# Patient Record
Sex: Female | Born: 1955 | Race: Black or African American | Hispanic: No | Smoking: Current every day smoker
Health system: Southern US, Community
[De-identification: ages and names within clinical notes are randomized; demographics above are authoritative.]

## PROBLEM LIST (undated history)

## (undated) DIAGNOSIS — I10 Essential (primary) hypertension: Secondary | ICD-10-CM

## (undated) DIAGNOSIS — B351 Tinea unguium: Secondary | ICD-10-CM

## (undated) DIAGNOSIS — J45909 Unspecified asthma, uncomplicated: Secondary | ICD-10-CM

## (undated) DIAGNOSIS — R1312 Dysphagia, oropharyngeal phase: Secondary | ICD-10-CM

## (undated) DIAGNOSIS — I4891 Unspecified atrial fibrillation: Secondary | ICD-10-CM

## (undated) DIAGNOSIS — C300 Malignant neoplasm of nasal cavity: Secondary | ICD-10-CM

## (undated) HISTORY — DX: Dysphagia, oropharyngeal phase: R13.12

## (undated) HISTORY — DX: Malignant neoplasm of nasal cavity: C30.0

## (undated) HISTORY — DX: Unspecified atrial fibrillation: I48.91

## (undated) HISTORY — DX: Tinea unguium: B35.1

---

## 1998-12-31 ENCOUNTER — Emergency Department (HOSPITAL_COMMUNITY): Admission: EM | Admit: 1998-12-31 | Discharge: 1998-12-31 | Payer: Self-pay

## 1999-01-01 ENCOUNTER — Encounter: Payer: Self-pay | Admitting: Emergency Medicine

## 2003-12-28 ENCOUNTER — Inpatient Hospital Stay (HOSPITAL_COMMUNITY): Admission: EM | Admit: 2003-12-28 | Discharge: 2004-01-10 | Payer: Self-pay | Admitting: Emergency Medicine

## 2003-12-28 IMAGING — CT CT ABDOMEN W/O CM
1 of 2 series · 14 of 32 positions shown, 18 images · IV contrast (agent unspecified)
Comparison: none

CLINICAL DATA: Dyspnea.  Sepsis.  Arrest.  Leukocytosis.  Abdominal pain.  
 PORTABLE CHEST ONE VIEW
 Portable exam 6476 hours.  Endotracheal tube at T3 in satisfactory position above carina.  Cardiac monitoring lines project over chest.  Heart size normal.  Lungs clear.  Question nodule versus nipple shadow lower right chest. 
 Minimal right base atelectasis.  
 IMPRESSION
 Satisfactory endotracheal tube position.  Question right nipple shadow versus nodule.  Mild right base atelectasis.  
 CT CHEST, ABDOMEN AND PELVIS WITH CONTRAST 
 Multidetector helical CT imaging of the chest, abdomen and pelvis performed without IV or oral contrast due to patient?s condition.

[Series 2: chest/abd/pelvis · axial · 0.63mm/px · z∈[-480,+94]mm · 14 of 137 slices shown, 18 images]
[im 6/137  soft-tissue]
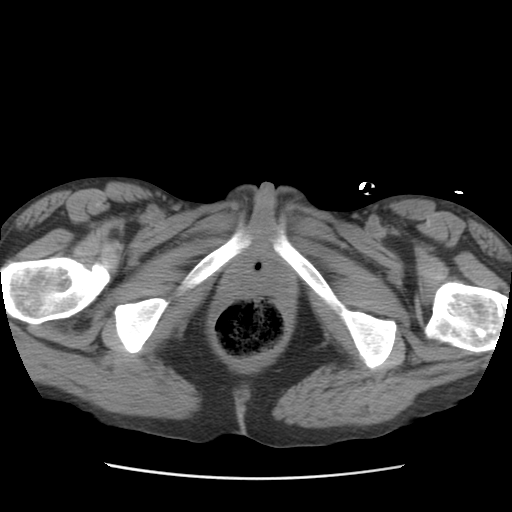
[im 6/137  bone]
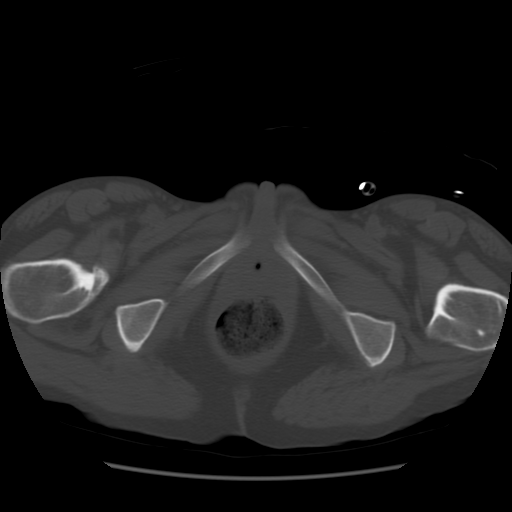
[im 18/137  soft-tissue]
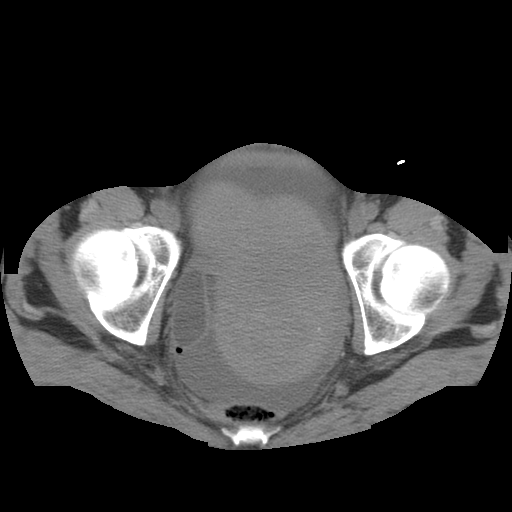
[im 30/137  soft-tissue]
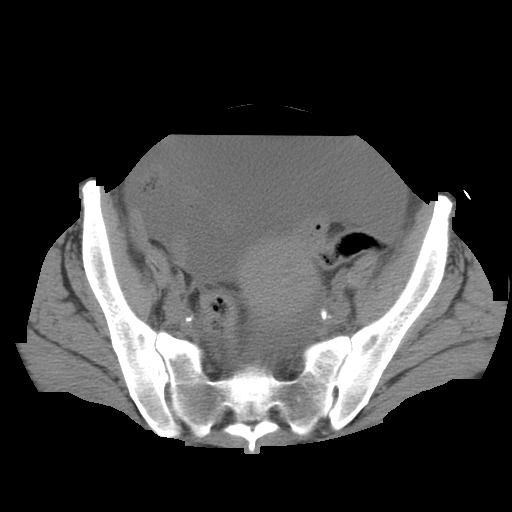
[im 42/137  soft-tissue]
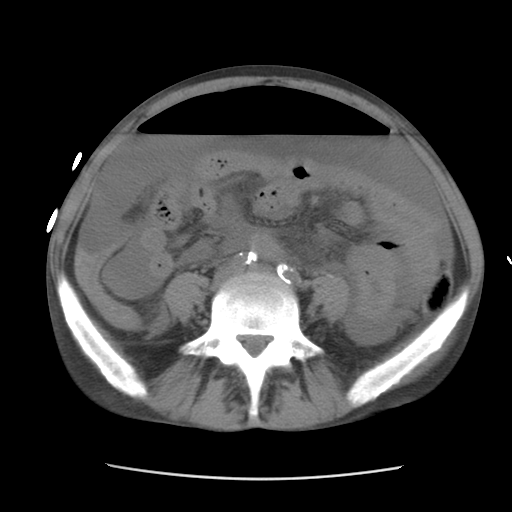
[im 54/137  soft-tissue]
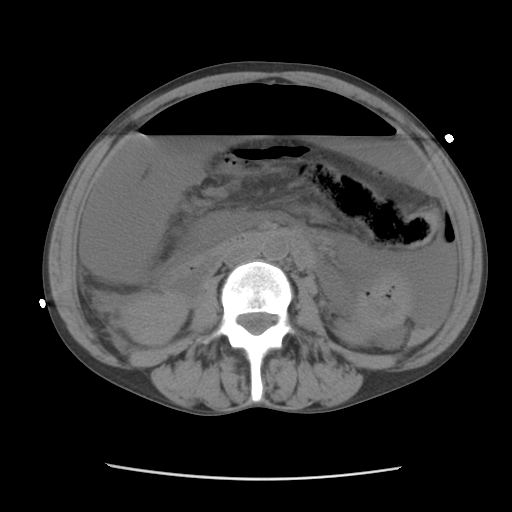
[im 66/137  soft-tissue]
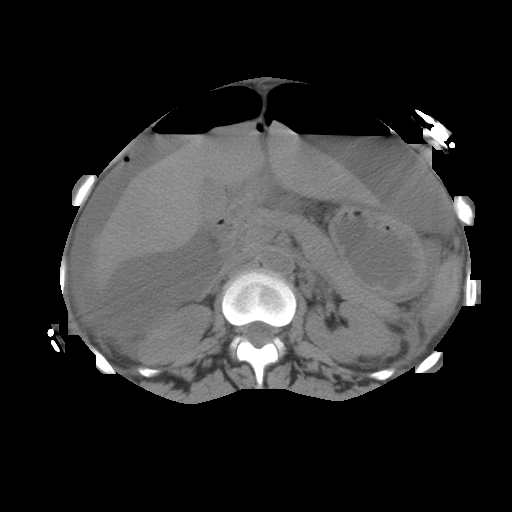
[im 71/137  soft-tissue]
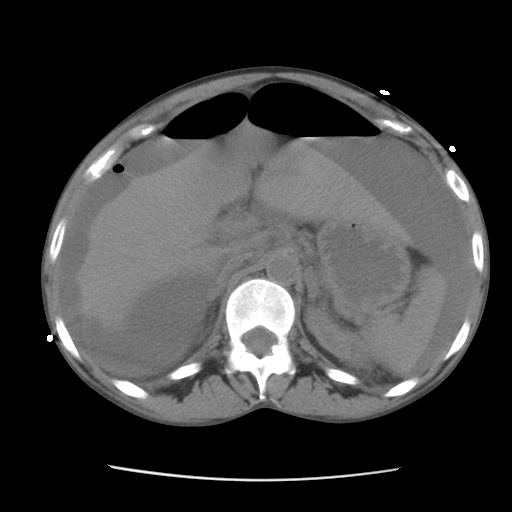
[im 83/137  soft-tissue]
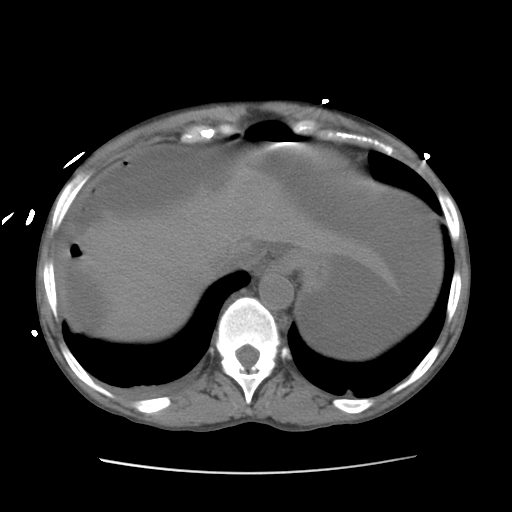
[im 95/137  soft-tissue]
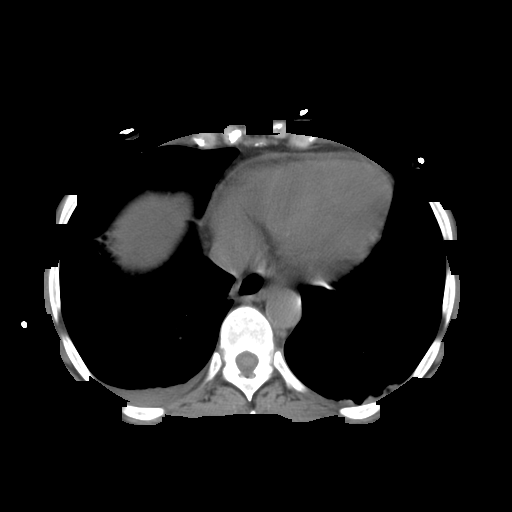
[im 95/137  bone]
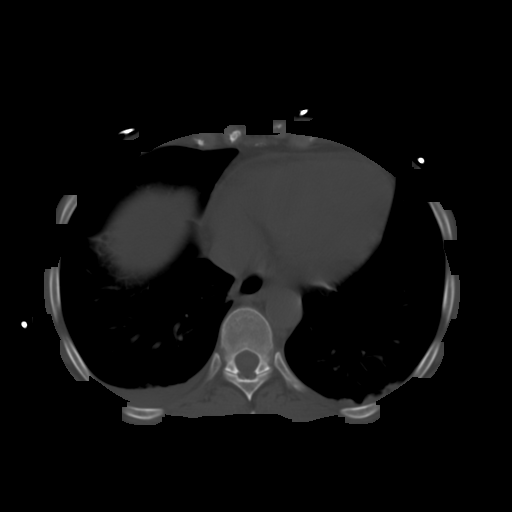
[im 107/137  soft-tissue]
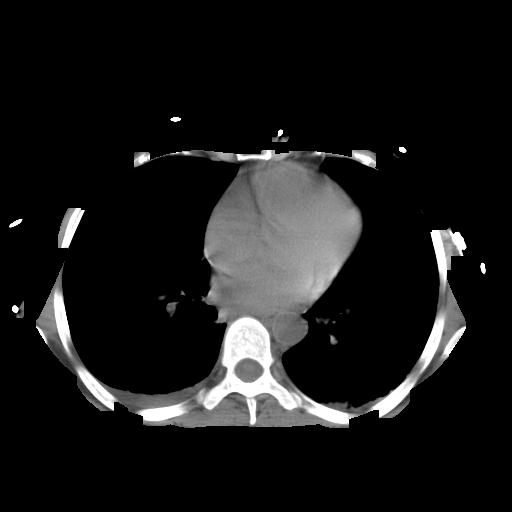
[im 113/137  lung]
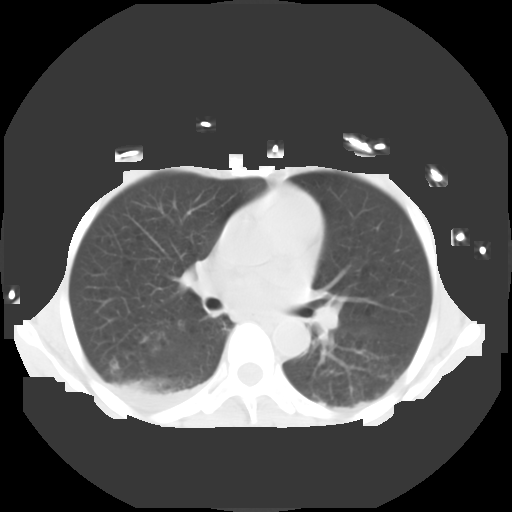
[im 119/137  soft-tissue]
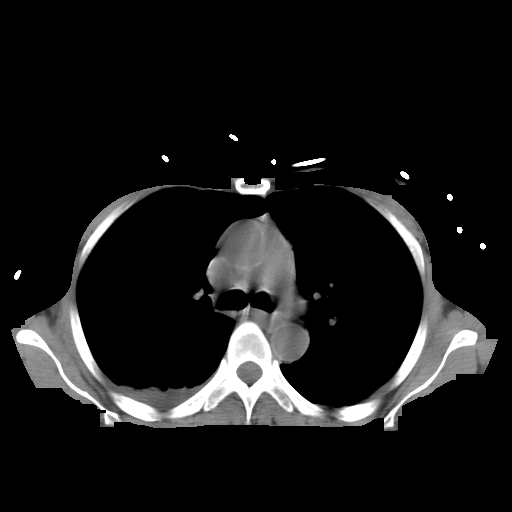
[im 119/137  lung]
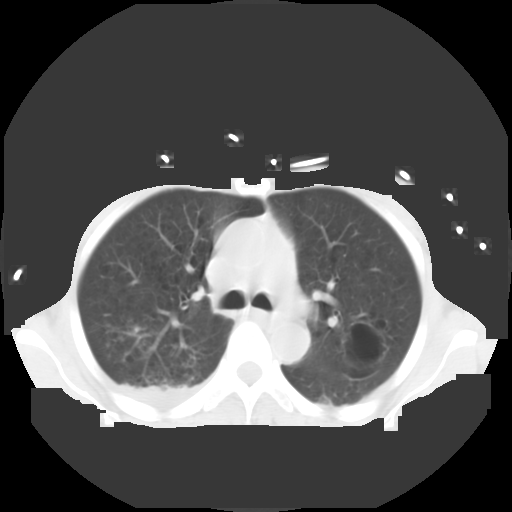
[im 125/137  lung]
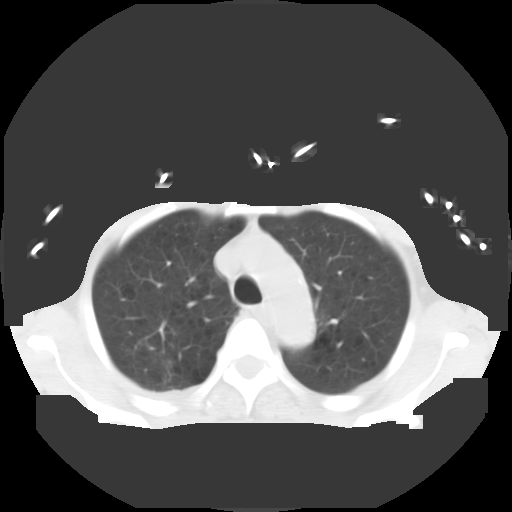
[im 131/137  soft-tissue]
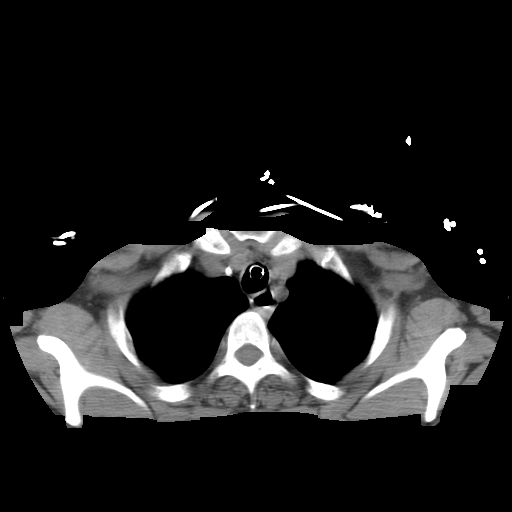
[im 131/137  lung]
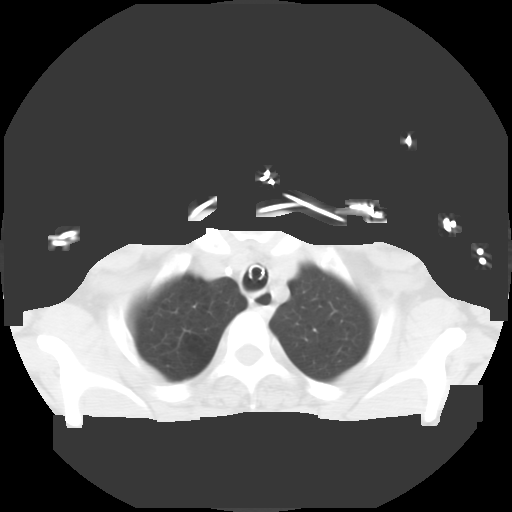

[14 of 32 positions shown; findings below may reference images not displayed]

FINDINGS: CT CHEST
 COPD.  Very small bilateral effusions right greater than left with minimal dependent atelectasis.  No evidence of pulmonary mass or nodule.  Mild infiltrate in posterior right lung.  Endotracheal tube in trachea above carina.  No pneumothorax. 
 IMPRESSION
 Minimal effusions right greater than left.  Mild posterior right lung infiltrate.  
 CT ABDOMEN
 Marked ascites.  Marked free intraperitoneal air compatible with perforated viscus.  Extensive stranding of mesenteric intraabdominal tissue planes.  Suspect small bowel wall thickening.  Extensive atherosclerotic calcifications.  Cannot exclude ischemia of the small bowel with this appearance.  Slight scalloping of the hepatic margins right lobe without focal hepatic mass.  Cholelithiasis.  Pancreas, spleen, kidneys and adrenal glands are grossly unremarkable.  
 IMPRESSION
 Cholelithiasis.  Extensive ascites with free intraperitoneal air compatible with perforated viscus.  Small bowel wall thickening, cannot exclude ischemia.  
 CT PELVIS
 Free air and ascites again seen.  Large pelvic mass, lobulated contours, 14.9 X 9.9 cm in size, extending for 8.5 cm in length.  This could represent an enlarged fibroid uterus but uterine tumors must also be considered.  Additionally, cannot exclude ovarian tumor.  Several septations are seen within the fluid collection in the right pelvis, question etiology.  Foley catheter in bladder.  Thickening of small bowel loops noted in pelvis. 
 IMPRESSION
 Large pelvic soft tissue mass question enlarged fibroid uterus versus tumor.  Correlation with patient?s history recommended. 
 Dr. Adriati and emergency room notified of findings.

## 2003-12-28 IMAGING — CR DG CHEST 1V PORT
1 series · 1 of 1 positions shown · IV contrast (agent unspecified)
Comparison: none

CLINICAL DATA: Dyspnea.  Sepsis.  Arrest.  Leukocytosis.  Abdominal pain.  
 PORTABLE CHEST ONE VIEW
 Portable exam 6476 hours.  Endotracheal tube at T3 in satisfactory position above carina.  Cardiac monitoring lines project over chest.  Heart size normal.  Lungs clear.  Question nodule versus nipple shadow lower right chest. 
 Minimal right base atelectasis.  
 IMPRESSION
 Satisfactory endotracheal tube position.  Question right nipple shadow versus nodule.  Mild right base atelectasis.  
 CT CHEST, ABDOMEN AND PELVIS WITH CONTRAST 
 Multidetector helical CT imaging of the chest, abdomen and pelvis performed without IV or oral contrast due to patient?s condition.

[view not recorded]
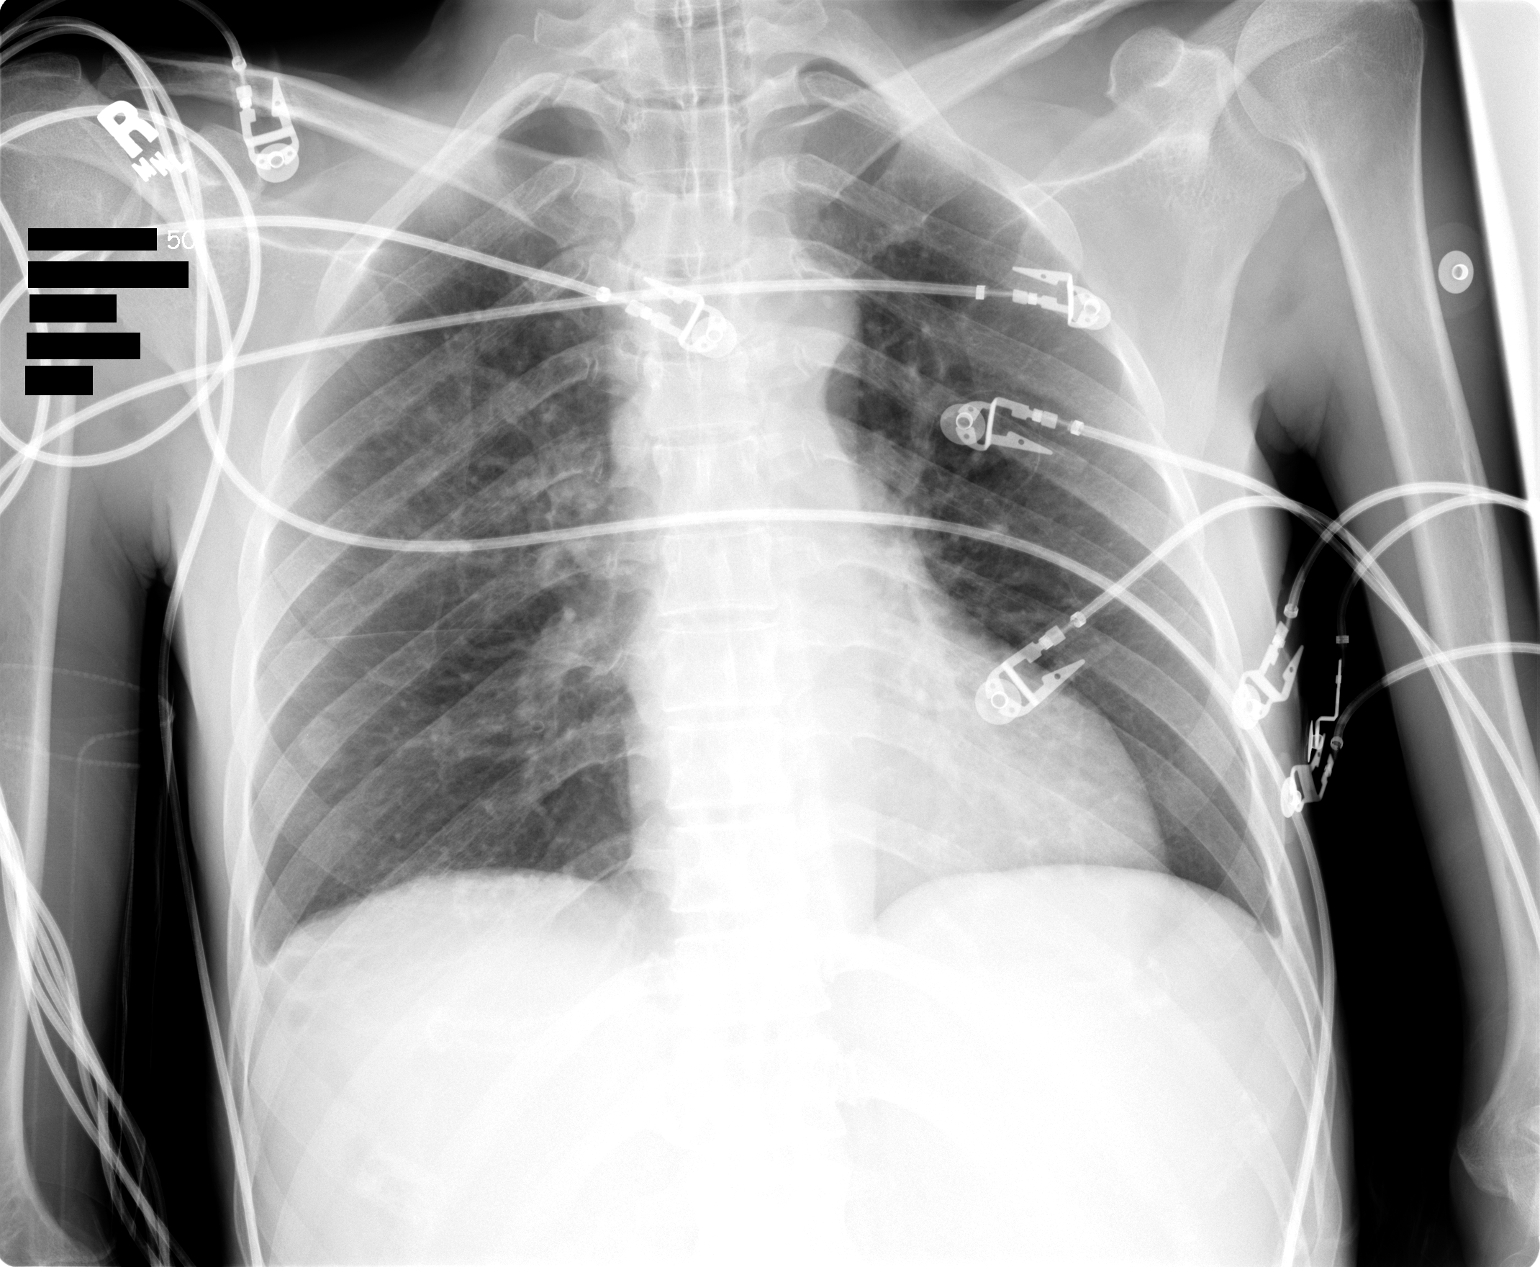

[1 of 1 positions shown; findings below may reference images not displayed]

FINDINGS: CT CHEST
 COPD.  Very small bilateral effusions right greater than left with minimal dependent atelectasis.  No evidence of pulmonary mass or nodule.  Mild infiltrate in posterior right lung.  Endotracheal tube in trachea above carina.  No pneumothorax. 
 IMPRESSION
 Minimal effusions right greater than left.  Mild posterior right lung infiltrate.  
 CT ABDOMEN
 Marked ascites.  Marked free intraperitoneal air compatible with perforated viscus.  Extensive stranding of mesenteric intraabdominal tissue planes.  Suspect small bowel wall thickening.  Extensive atherosclerotic calcifications.  Cannot exclude ischemia of the small bowel with this appearance.  Slight scalloping of the hepatic margins right lobe without focal hepatic mass.  Cholelithiasis.  Pancreas, spleen, kidneys and adrenal glands are grossly unremarkable.  
 IMPRESSION
 Cholelithiasis.  Extensive ascites with free intraperitoneal air compatible with perforated viscus.  Small bowel wall thickening, cannot exclude ischemia.  
 CT PELVIS
 Free air and ascites again seen.  Large pelvic mass, lobulated contours, 14.9 X 9.9 cm in size, extending for 8.5 cm in length.  This could represent an enlarged fibroid uterus but uterine tumors must also be considered.  Additionally, cannot exclude ovarian tumor.  Several septations are seen within the fluid collection in the right pelvis, question etiology.  Foley catheter in bladder.  Thickening of small bowel loops noted in pelvis. 
 IMPRESSION
 Large pelvic soft tissue mass question enlarged fibroid uterus versus tumor.  Correlation with patient?s history recommended. 
 Dr. Adriati and emergency room notified of findings.

## 2003-12-28 IMAGING — CR DG CHEST 1V PORT
1 series · 1 of 1 positions shown · non-contrast
Comparison: none

CLINICAL DATA: Shock.  Metabolic acidosis. Acute abdomen. 
 PORTABLE CHEST 12/28/03 AT 9961 HOURS

[view not recorded]
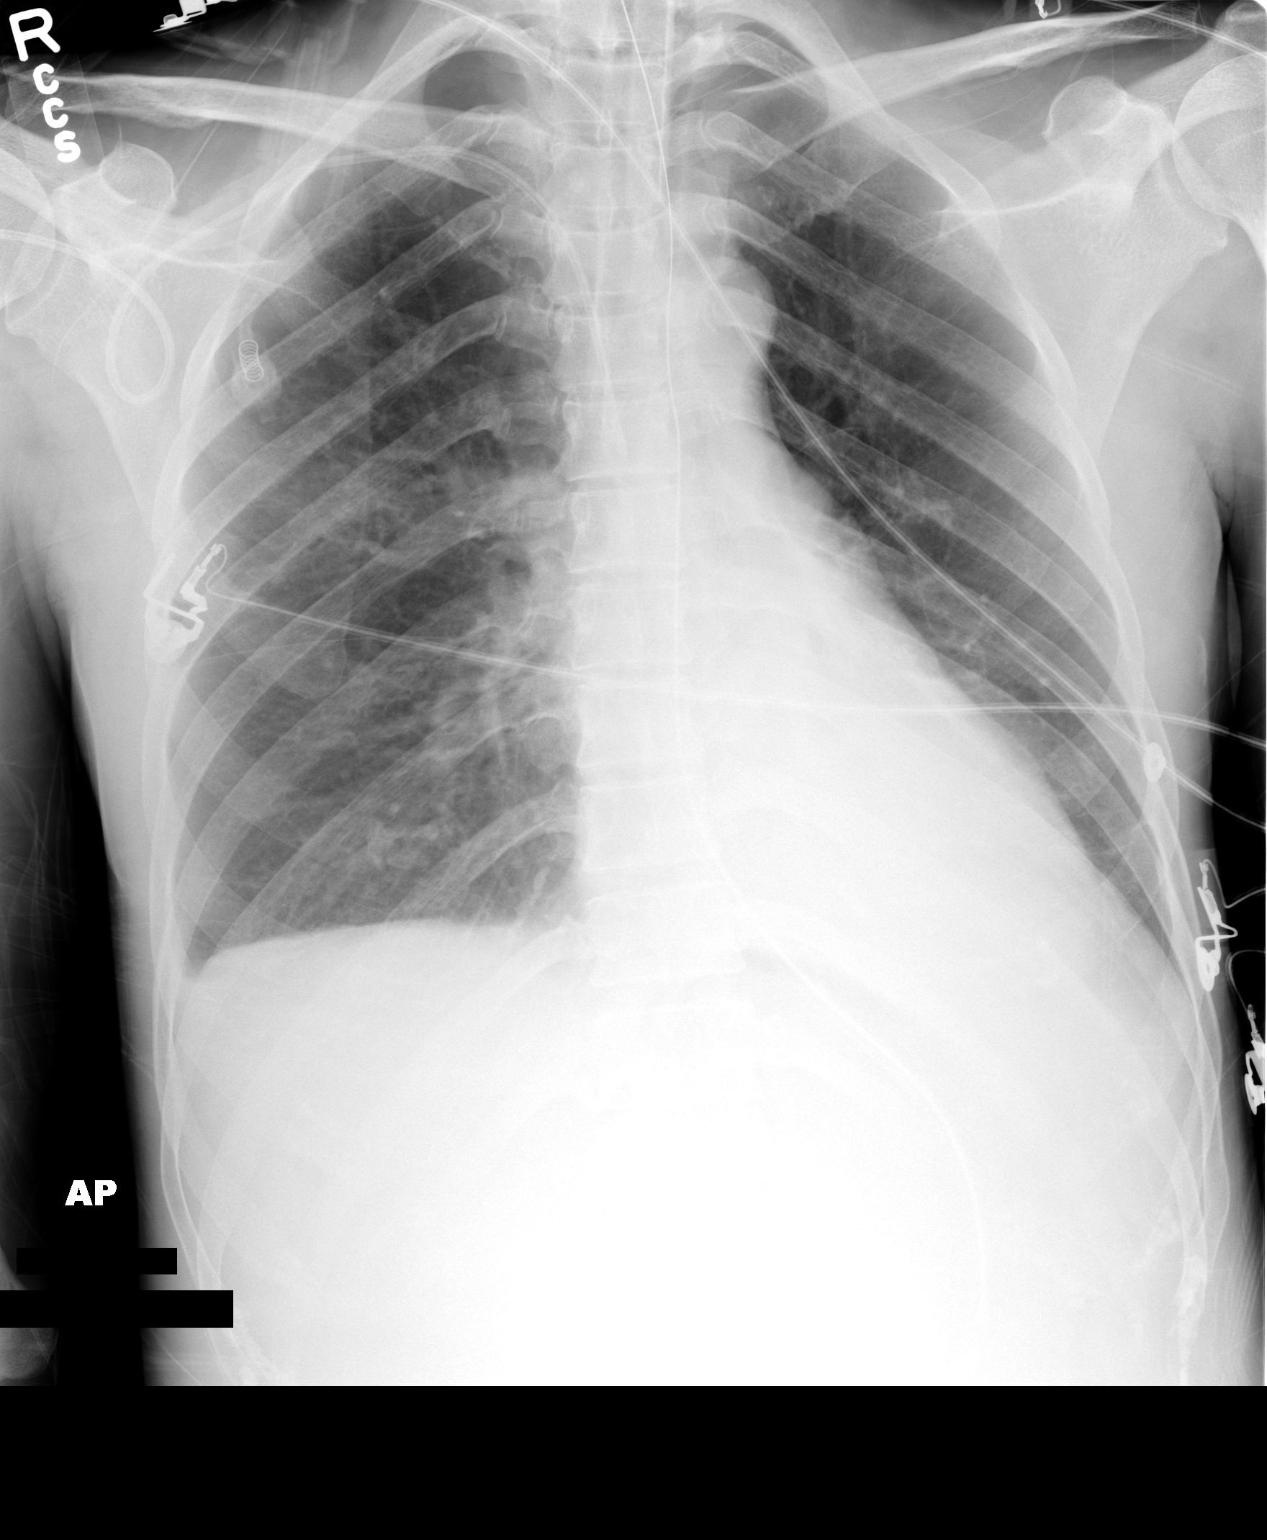

[1 of 1 positions shown; findings below may reference images not displayed]

FINDINGS: Nasogastric tube, endotracheal tube, and PICC line project in expected location.  There has been development of left retrocardiac consolidation or atelectasis since previous film of the same day.  There is blunting of the right costophrenic angle which  may be due to some scarring or small effusion.  
 IMPRESSION
 Nasogastric tube placement to the stomach.  
 New left retrocardiac consolidation or atelectasis.
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
## 2003-12-29 IMAGING — CR DG CHEST 1V PORT
1 series · 1 of 1 positions shown · non-contrast
Comparison: none

CLINICAL DATA: Shock.  Metabolic acidosis.  Follow-up.  
 CHEST PORTABLE ONE VIEW 
 Portable exam 0505 hours.  Compared to 12/28/03.  Endotracheal tube above the aortic arch in satisfactory position above carina.  Nasogastric tube in stomach.  Right subclavian central venous catheter tip SVC.  Upper normal heart size.  Mediastinal contours and vascularity normal.  Bibasilar atelectasis persists.  Left lower lobe consolidation.  No pneumothorax. 
 IMPRESSION
 Left  lower lobe atelectasis versus consolidation.  Mild right base atelectasis.  No interval change.

[view not recorded]
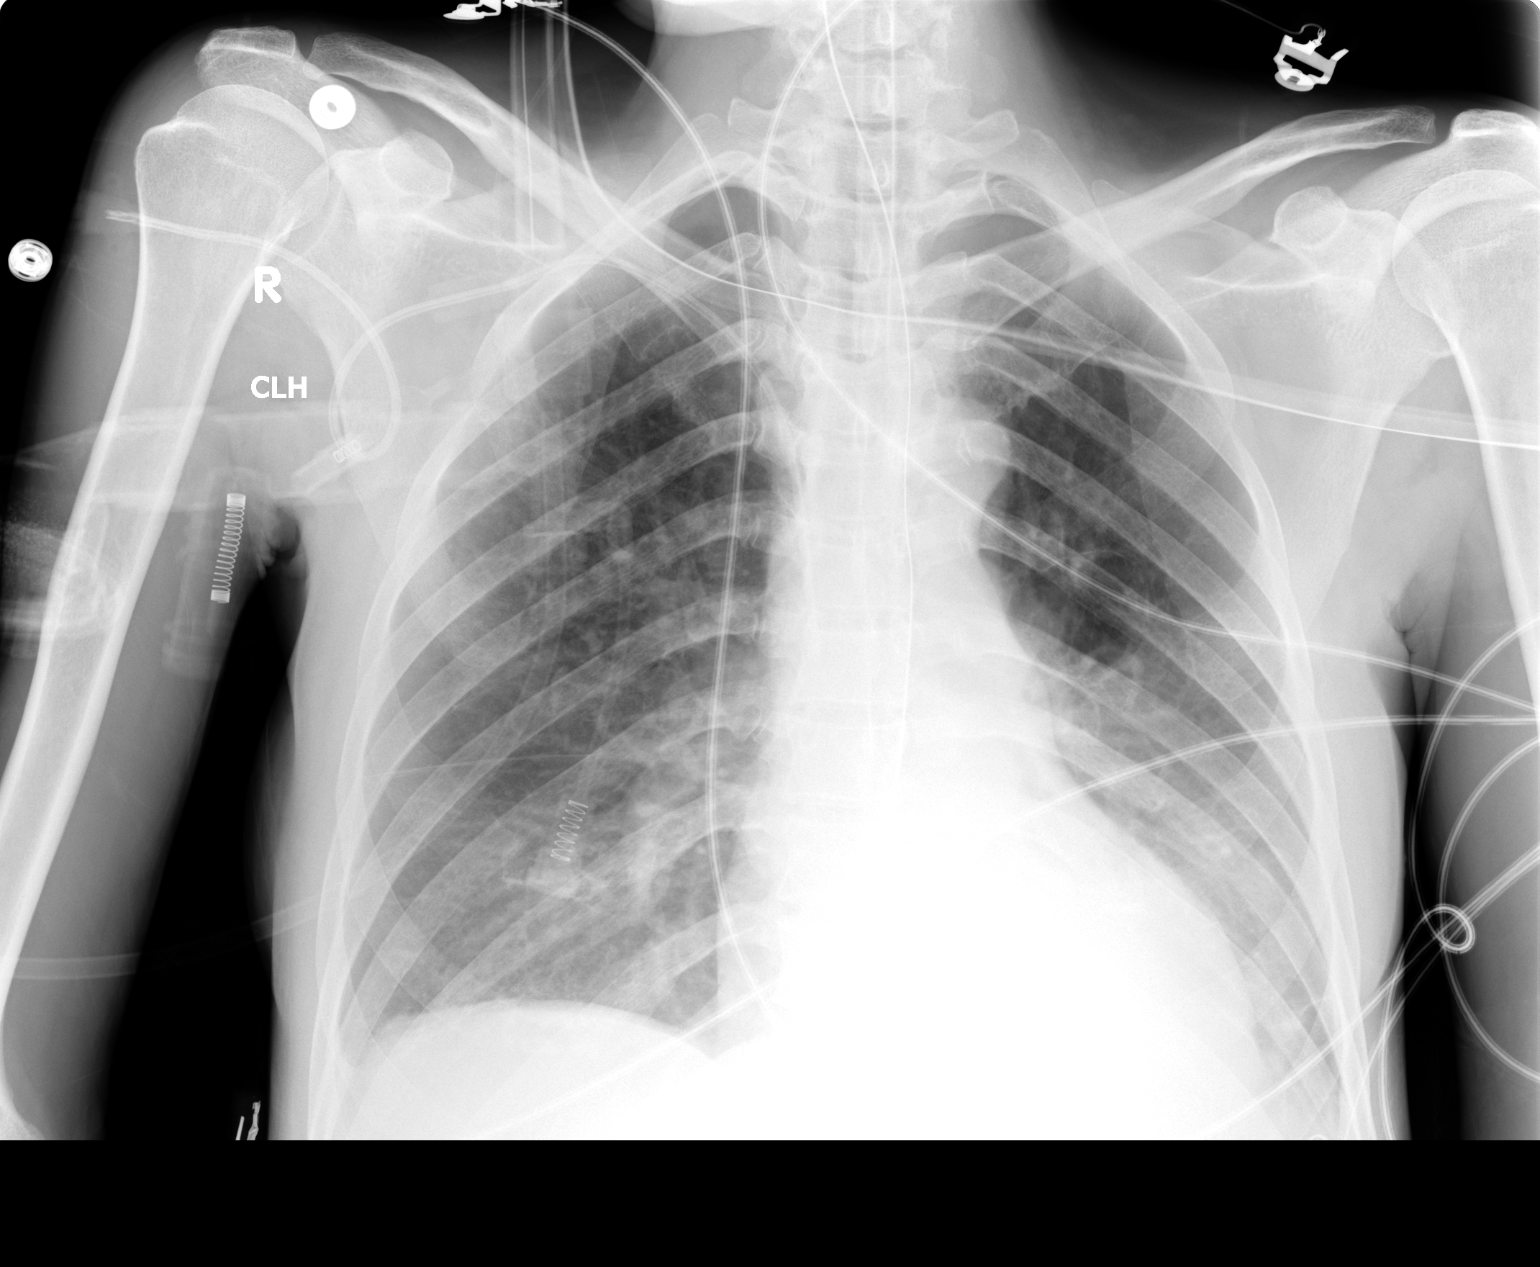

[1 of 1 positions shown; findings below may reference images not displayed]
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
## 2004-01-04 IMAGING — CR DG CHEST 1V PORT
1 series · 1 of 1 positions shown · non-contrast
Comparison: 01/03/04

PORTABLE SINGLE-VIEW CHEST:  01/04/04 [DATE]

 CLINICAL DATA
 Metabolic acidosis and respiratory arrest.

[view not recorded]
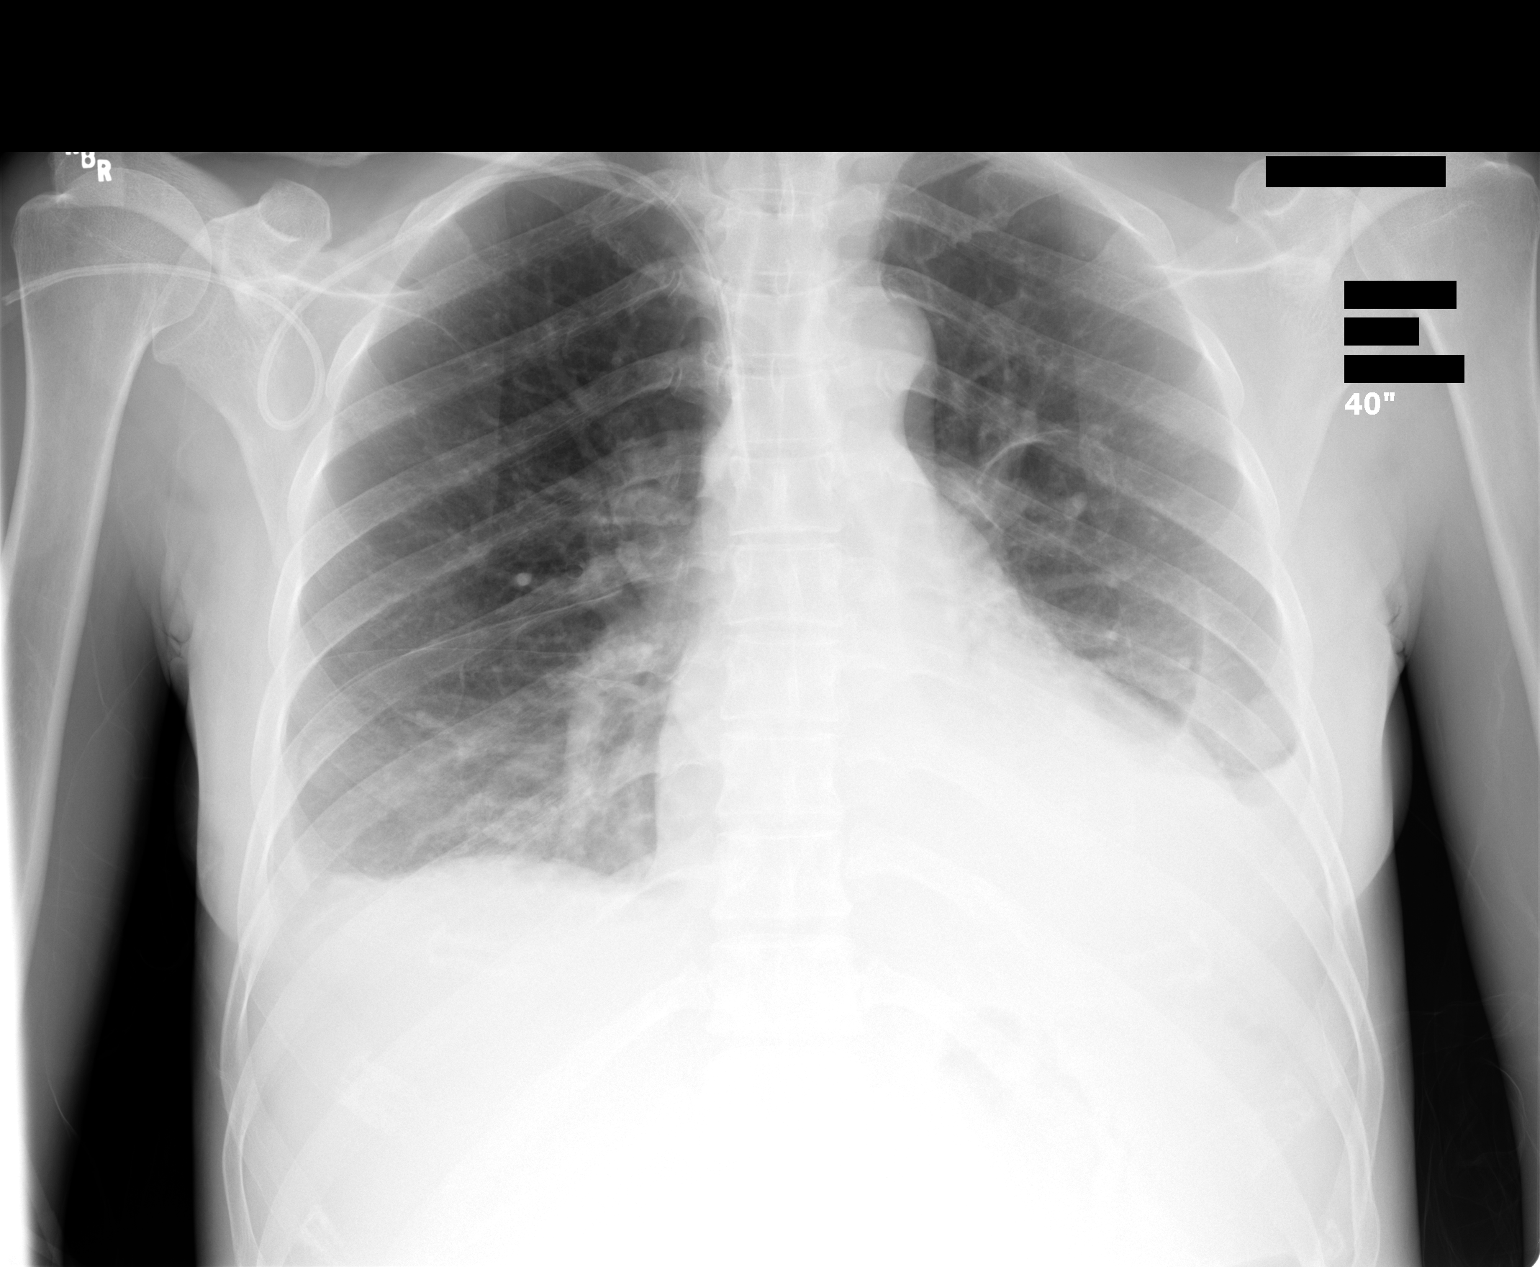

[1 of 1 positions shown; findings below may reference images not displayed]

FINDINGS
 There is increase in left pleural fluid volume which is moderate.  Stable small right pleural effusion.  Increase in left lower lobe atelectasis.  Stable pneumatocele in left lung.  No overt edema.  

 IMPRESSION
 Increased left pleural fluid volume with overall moderate volume.  Stable small right effusion.

## 2004-01-05 IMAGING — CT CT ABDOMEN W/ CM
1 series · 15 of 32 positions shown, 19 images · IV contrast (GASTROGRAFIN & [ID] OMNI 300)
Comparison: none

CLINICAL DATA: Recent exploratory surgery for peptic ulcer disease.  Fever.  Rule out abscess.
 CT ABDOMEN WITH CONTRAST 
 100 cc Omnipaque 300 IV.
 Small-to-moderate bilateral pleural effusions are noted with bibasilar atelectasis.  Multiple irregular low-density areas are seen in the spleen.  This may be related to splenic injury or possibly splenic infarction.  Left lobe of the liver is hypertrophied.  Question hepatocellular disease or cirrhosis.  Gallstones are noted.
 Multiple loculated peritoneal fluid collections are seen under the diaphragm bilaterally.  The largest lesion is located anteriorly, and anterior to the left lobe of the liver it measures 15 x 1.5 cm and contains some gas bubbles.  These are compatible with multiple loculated abscesses.  There is also loculated fluid in the paracolic gutters bilaterally.  
 There is an ileus.  The pancreatic duct is dilated measuring about 4 mm in diameter.  
 There is moderate hydronephrosis bilaterally.
 IMPRESSION 
 Moderately large bilateral pleural effusions.
 Diffusely abnormal spleen which may be due to infarction or injury.
 Multiple loculated peritoneal fluid collections, compatible with multiple abscesses. 
 Ileus.
 Bilateral hydronephrosis.
 CT PELVIS WITH CONTRAST 
 Multiple loculated fluid collections are noted in the pelvis.  The largest is located anteriorly and measures approximately 11 x 3.7 cm.  Other smaller loculated fluid collections are noted in the adnexal regions bilaterally.  Large pelvic mass is noted measuring 15 x 10 cm with multiple low-density regions.  This is most likely due to an enlarged uterus and multiple degenerated fibroids, which are not calcified.  Foley catheter is in the urinary bladder.  Note is made of an ileus.  
 Multiple loculated fluid collections in the pelvis, compatible with multiple abscesses.
 Markedly enlarged uterus containing multiple degenerated fibroids.

[Series 2: routine abdomen · axial · 0.70mm/px · z∈[-468,-48]mm · 15 of 192 slices shown, 19 images]
[im 13/192  soft-tissue]
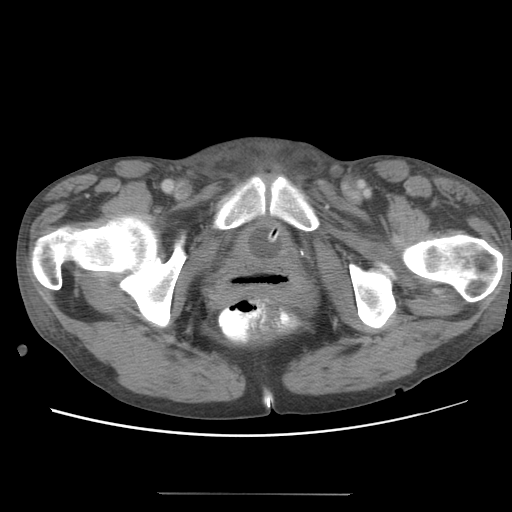
[im 13/192  bone]
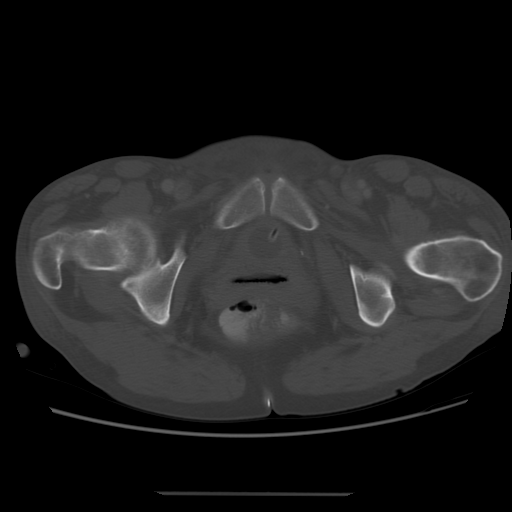
[im 25/192  soft-tissue]
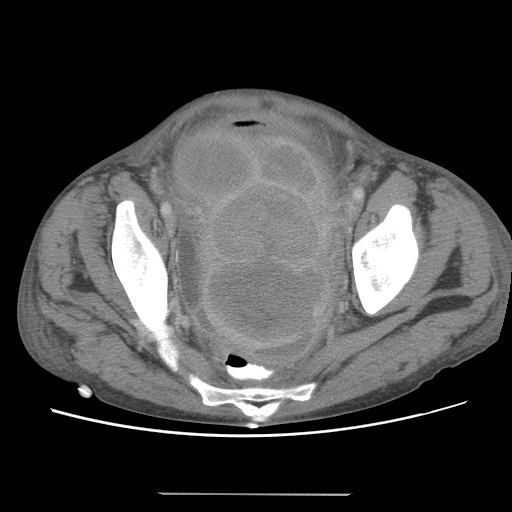
[im 37/192  soft-tissue]
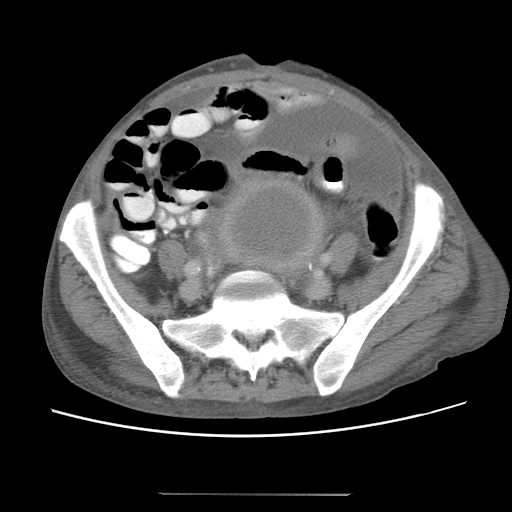
[im 56/192  soft-tissue]
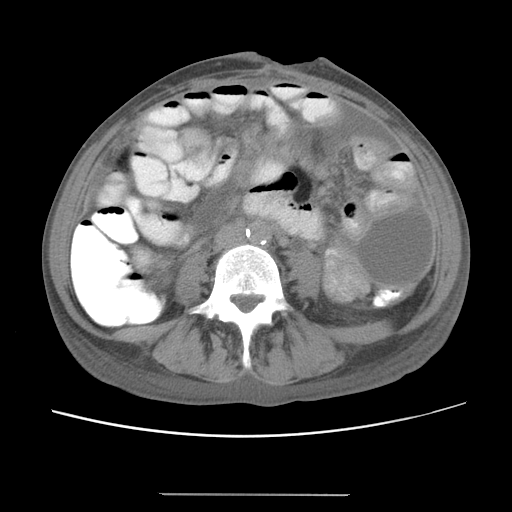
[im 68/192  soft-tissue]
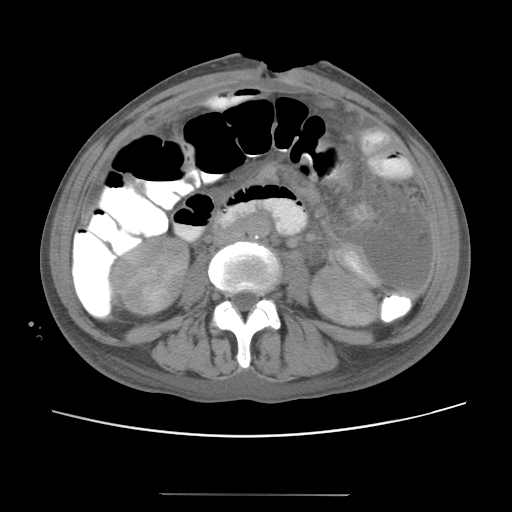
[im 81/192  soft-tissue]
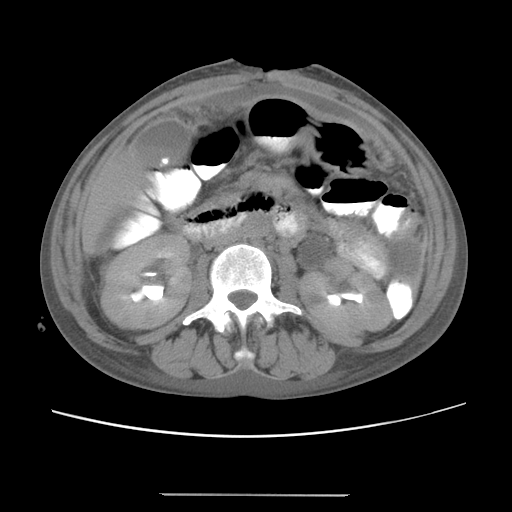
[im 99/192  soft-tissue]
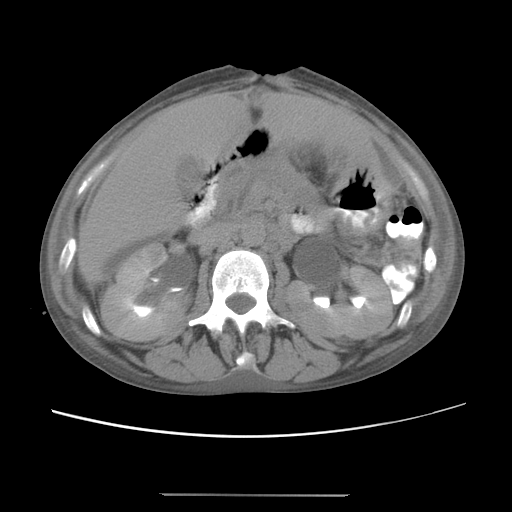
[im 111/192  soft-tissue]
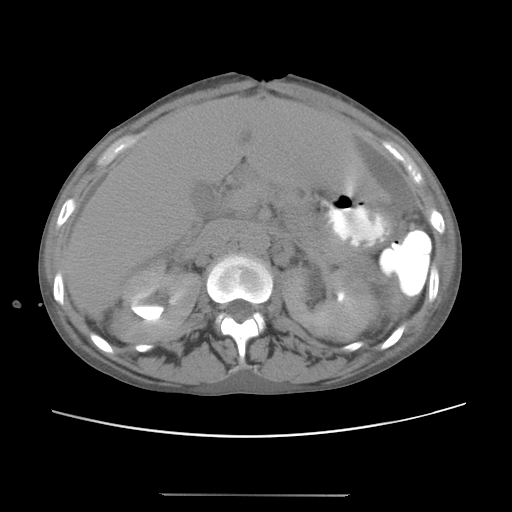
[im 124/192  soft-tissue]
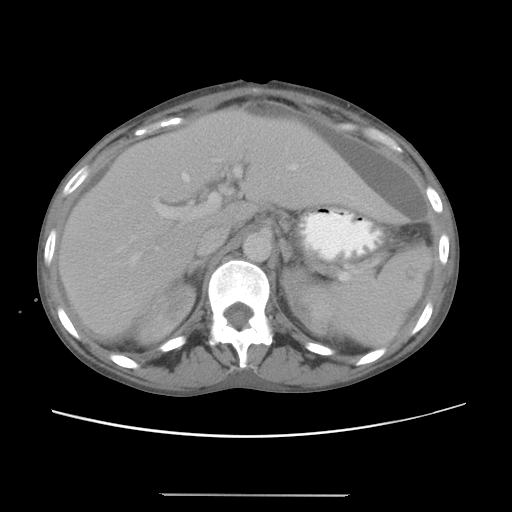
[im 124/192  bone]
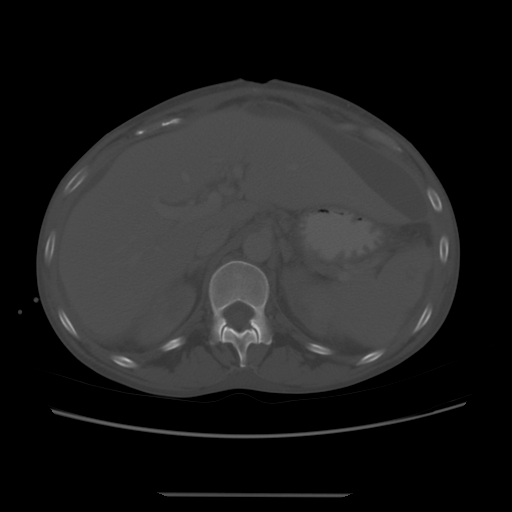
[im 136/192  soft-tissue]
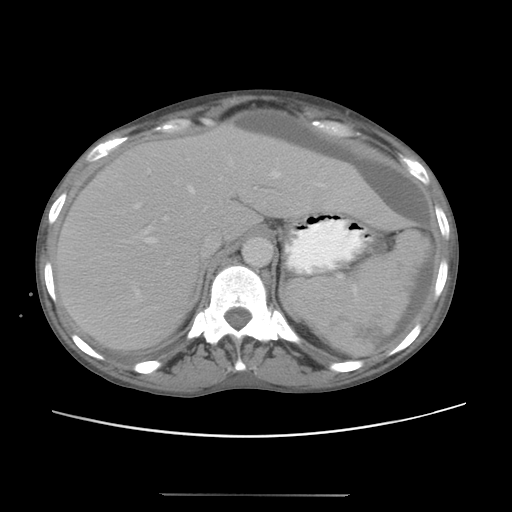
[im 155/192  soft-tissue]
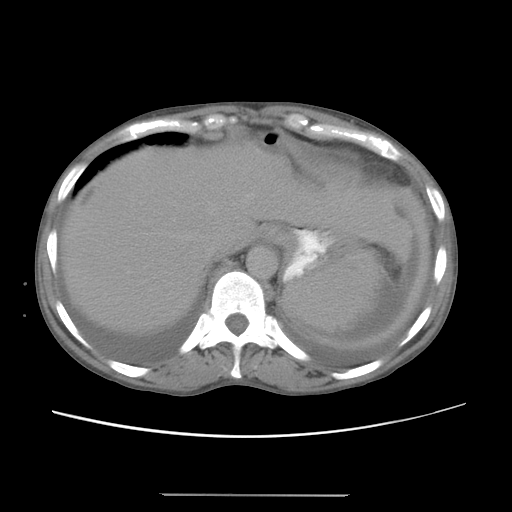
[im 167/192  soft-tissue]
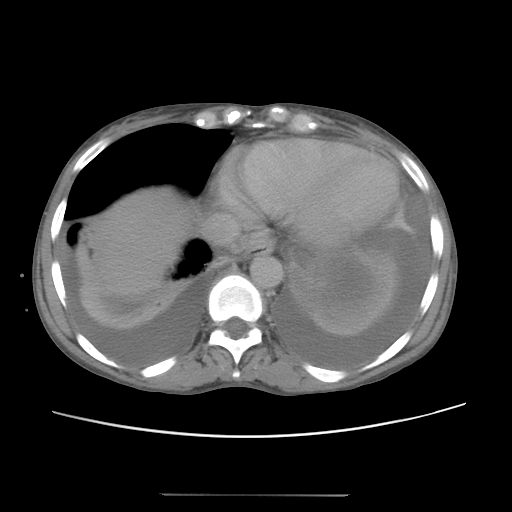
[im 167/192  lung]
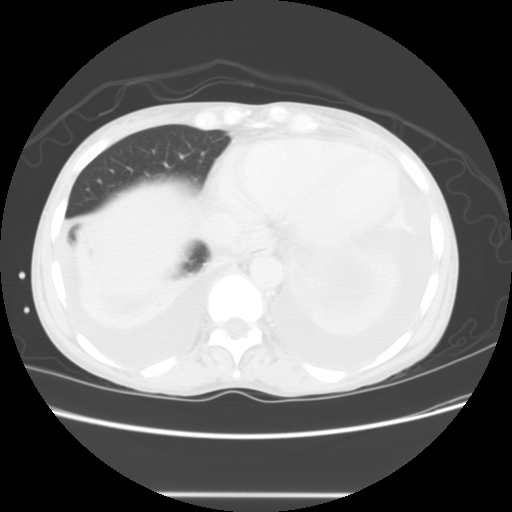
[im 173/192  lung]
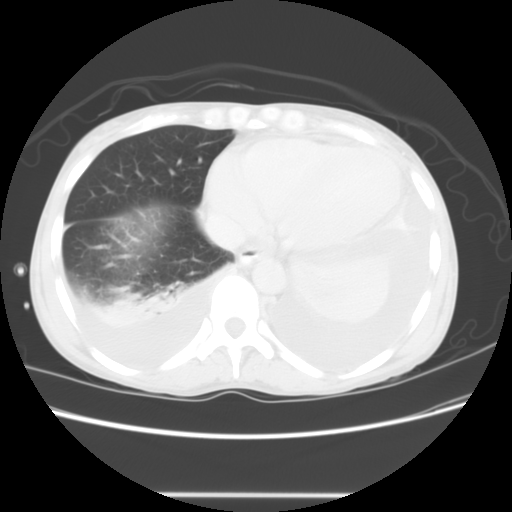
[im 179/192  soft-tissue]
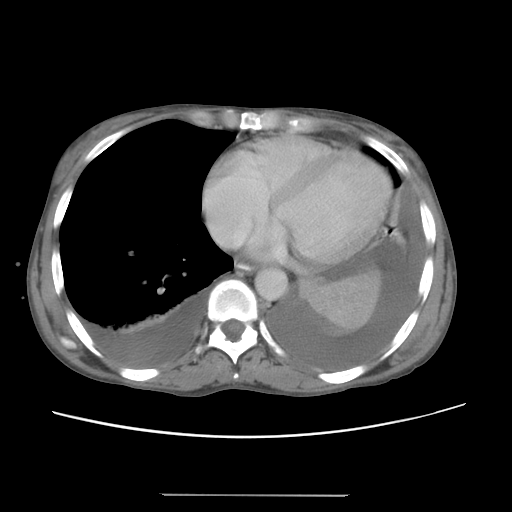
[im 179/192  lung]
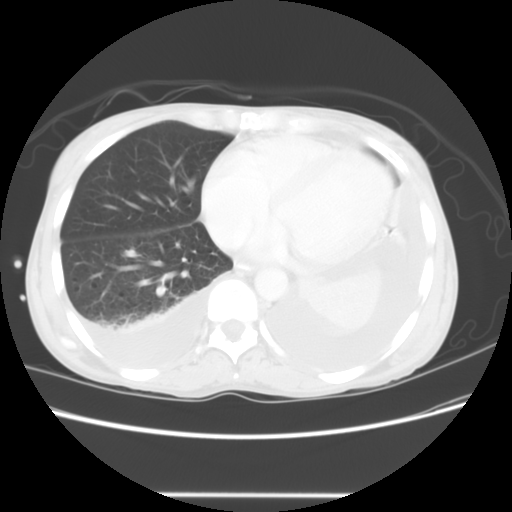
[im 185/192  lung]
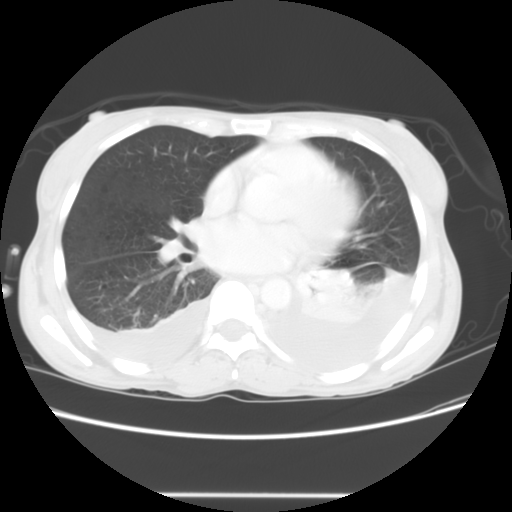

[15 of 32 positions shown; findings below may reference images not displayed]
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
## 2018-03-18 ENCOUNTER — Encounter (HOSPITAL_COMMUNITY): Payer: Self-pay | Admitting: Emergency Medicine

## 2018-03-18 ENCOUNTER — Emergency Department (HOSPITAL_COMMUNITY): Payer: Self-pay

## 2018-03-18 ENCOUNTER — Encounter (HOSPITAL_COMMUNITY)
Admission: EM | Disposition: A | Discharge status: Home Health | Payer: Self-pay | Source: Home / Self Care | Attending: Internal Medicine

## 2018-03-18 ENCOUNTER — Inpatient Hospital Stay (HOSPITAL_COMMUNITY): Payer: Self-pay | Admitting: Certified Registered"

## 2018-03-18 ENCOUNTER — Inpatient Hospital Stay (HOSPITAL_COMMUNITY): Payer: Self-pay

## 2018-03-18 ENCOUNTER — Other Ambulatory Visit: Payer: Self-pay

## 2018-03-18 ENCOUNTER — Inpatient Hospital Stay (HOSPITAL_COMMUNITY)
Admission: EM | Admit: 2018-03-18 | Discharge: 2018-03-22 | DRG: 480 | Disposition: A | Discharge status: Home Health | Payer: Self-pay | Attending: Internal Medicine | Admitting: Internal Medicine

## 2018-03-18 DIAGNOSIS — Z7982 Long term (current) use of aspirin: Secondary | ICD-10-CM

## 2018-03-18 DIAGNOSIS — E871 Hypo-osmolality and hyponatremia: Secondary | ICD-10-CM | POA: Diagnosis present

## 2018-03-18 DIAGNOSIS — T1490XA Injury, unspecified, initial encounter: Secondary | ICD-10-CM

## 2018-03-18 DIAGNOSIS — F1010 Alcohol abuse, uncomplicated: Secondary | ICD-10-CM

## 2018-03-18 DIAGNOSIS — D62 Acute posthemorrhagic anemia: Secondary | ICD-10-CM | POA: Diagnosis not present

## 2018-03-18 DIAGNOSIS — Z681 Body mass index (BMI) 19 or less, adult: Secondary | ICD-10-CM

## 2018-03-18 DIAGNOSIS — Y9301 Activity, walking, marching and hiking: Secondary | ICD-10-CM | POA: Diagnosis present

## 2018-03-18 DIAGNOSIS — I493 Ventricular premature depolarization: Secondary | ICD-10-CM | POA: Diagnosis present

## 2018-03-18 DIAGNOSIS — J45909 Unspecified asthma, uncomplicated: Secondary | ICD-10-CM | POA: Diagnosis present

## 2018-03-18 DIAGNOSIS — I10 Essential (primary) hypertension: Secondary | ICD-10-CM | POA: Diagnosis present

## 2018-03-18 DIAGNOSIS — E43 Unspecified severe protein-calorie malnutrition: Secondary | ICD-10-CM

## 2018-03-18 DIAGNOSIS — F10220 Alcohol dependence with intoxication, uncomplicated: Secondary | ICD-10-CM

## 2018-03-18 DIAGNOSIS — J4520 Mild intermittent asthma, uncomplicated: Secondary | ICD-10-CM

## 2018-03-18 DIAGNOSIS — F17210 Nicotine dependence, cigarettes, uncomplicated: Secondary | ICD-10-CM | POA: Diagnosis present

## 2018-03-18 DIAGNOSIS — E861 Hypovolemia: Secondary | ICD-10-CM | POA: Diagnosis present

## 2018-03-18 DIAGNOSIS — G8918 Other acute postprocedural pain: Secondary | ICD-10-CM

## 2018-03-18 DIAGNOSIS — S72142A Displaced intertrochanteric fracture of left femur, initial encounter for closed fracture: Principal | ICD-10-CM | POA: Diagnosis present

## 2018-03-18 DIAGNOSIS — D509 Iron deficiency anemia, unspecified: Secondary | ICD-10-CM

## 2018-03-18 DIAGNOSIS — Z79899 Other long term (current) drug therapy: Secondary | ICD-10-CM

## 2018-03-18 DIAGNOSIS — W010XXA Fall on same level from slipping, tripping and stumbling without subsequent striking against object, initial encounter: Secondary | ICD-10-CM | POA: Diagnosis present

## 2018-03-18 DIAGNOSIS — S72002A Fracture of unspecified part of neck of left femur, initial encounter for closed fracture: Secondary | ICD-10-CM

## 2018-03-18 DIAGNOSIS — Y906 Blood alcohol level of 120-199 mg/100 ml: Secondary | ICD-10-CM | POA: Diagnosis present

## 2018-03-18 DIAGNOSIS — F10229 Alcohol dependence with intoxication, unspecified: Secondary | ICD-10-CM | POA: Diagnosis present

## 2018-03-18 HISTORY — DX: Unspecified asthma, uncomplicated: J45.909

## 2018-03-18 HISTORY — DX: Essential (primary) hypertension: I10

## 2018-03-18 HISTORY — PX: INTRAMEDULLARY (IM) NAIL INTERTROCHANTERIC: SHX5875

## 2018-03-18 LAB — CBC WITH DIFFERENTIAL/PLATELET
Abs Immature Granulocytes: 0 10*3/uL (ref 0.0–0.1)
Basophils Absolute: 0 10*3/uL (ref 0.0–0.1)
Basophils Relative: 0 %
EOS PCT: 0 %
Eosinophils Absolute: 0 10*3/uL (ref 0.0–0.7)
HEMATOCRIT: 44.2 % (ref 36.0–46.0)
HEMOGLOBIN: 14.7 g/dL (ref 12.0–15.0)
Immature Granulocytes: 0 %
LYMPHS ABS: 0.9 10*3/uL (ref 0.7–4.0)
LYMPHS PCT: 10 %
MCH: 29.4 pg (ref 26.0–34.0)
MCHC: 33.3 g/dL (ref 30.0–36.0)
MCV: 88.4 fL (ref 78.0–100.0)
MONO ABS: 0.4 10*3/uL (ref 0.1–1.0)
Monocytes Relative: 4 %
Neutro Abs: 7.6 10*3/uL (ref 1.7–7.7)
Neutrophils Relative %: 86 %
Platelets: 189 10*3/uL (ref 150–400)
RBC: 5 MIL/uL (ref 3.87–5.11)
RDW: 14.9 % (ref 11.5–15.5)
WBC: 8.9 10*3/uL (ref 4.0–10.5)

## 2018-03-18 LAB — BASIC METABOLIC PANEL
Anion gap: 12 (ref 5–15)
BUN: 5 mg/dL — AB (ref 8–23)
CHLORIDE: 94 mmol/L — AB (ref 98–111)
CO2: 24 mmol/L (ref 22–32)
Calcium: 8.6 mg/dL — ABNORMAL LOW (ref 8.9–10.3)
Creatinine, Ser: 0.71 mg/dL (ref 0.44–1.00)
GFR calc non Af Amer: >60 mL/min (ref >60)
GLUCOSE: 111 mg/dL — AB (ref 70–99)
POTASSIUM: 4 mmol/L (ref 3.5–5.1)
SODIUM: 130 mmol/L — AB (ref 135–145)

## 2018-03-18 LAB — PROTIME-INR
INR: 1.07
Prothrombin Time: 13.8 seconds (ref 11.4–15.2)

## 2018-03-18 LAB — TYPE AND SCREEN
ABO/RH(D): O POS
Antibody Screen: NEGATIVE

## 2018-03-18 LAB — ABO/RH: ABO/RH(D): O POS

## 2018-03-18 LAB — HIV ANTIBODY (ROUTINE TESTING W REFLEX): HIV SCREEN 4TH GENERATION: NONREACTIVE

## 2018-03-18 LAB — MRSA PCR SCREENING: MRSA by PCR: NEGATIVE

## 2018-03-18 LAB — ETHANOL: ALCOHOL ETHYL (B): 190 mg/dL — AB (ref <10)

## 2018-03-18 IMAGING — DX DG FEMUR 2+V PORT*L*
1 series · 2 of 2 positions shown · non-contrast
Comparison: 03/18/2018

CLINICAL DATA: Post op. Left intratrochanteric intramedullary rod

EXAM:
LEFT FEMUR PORTABLE 2 VIEWS

[Series 1: femur · 0.14mm/px · 2 of 2 slices shown]
[im 1/2]
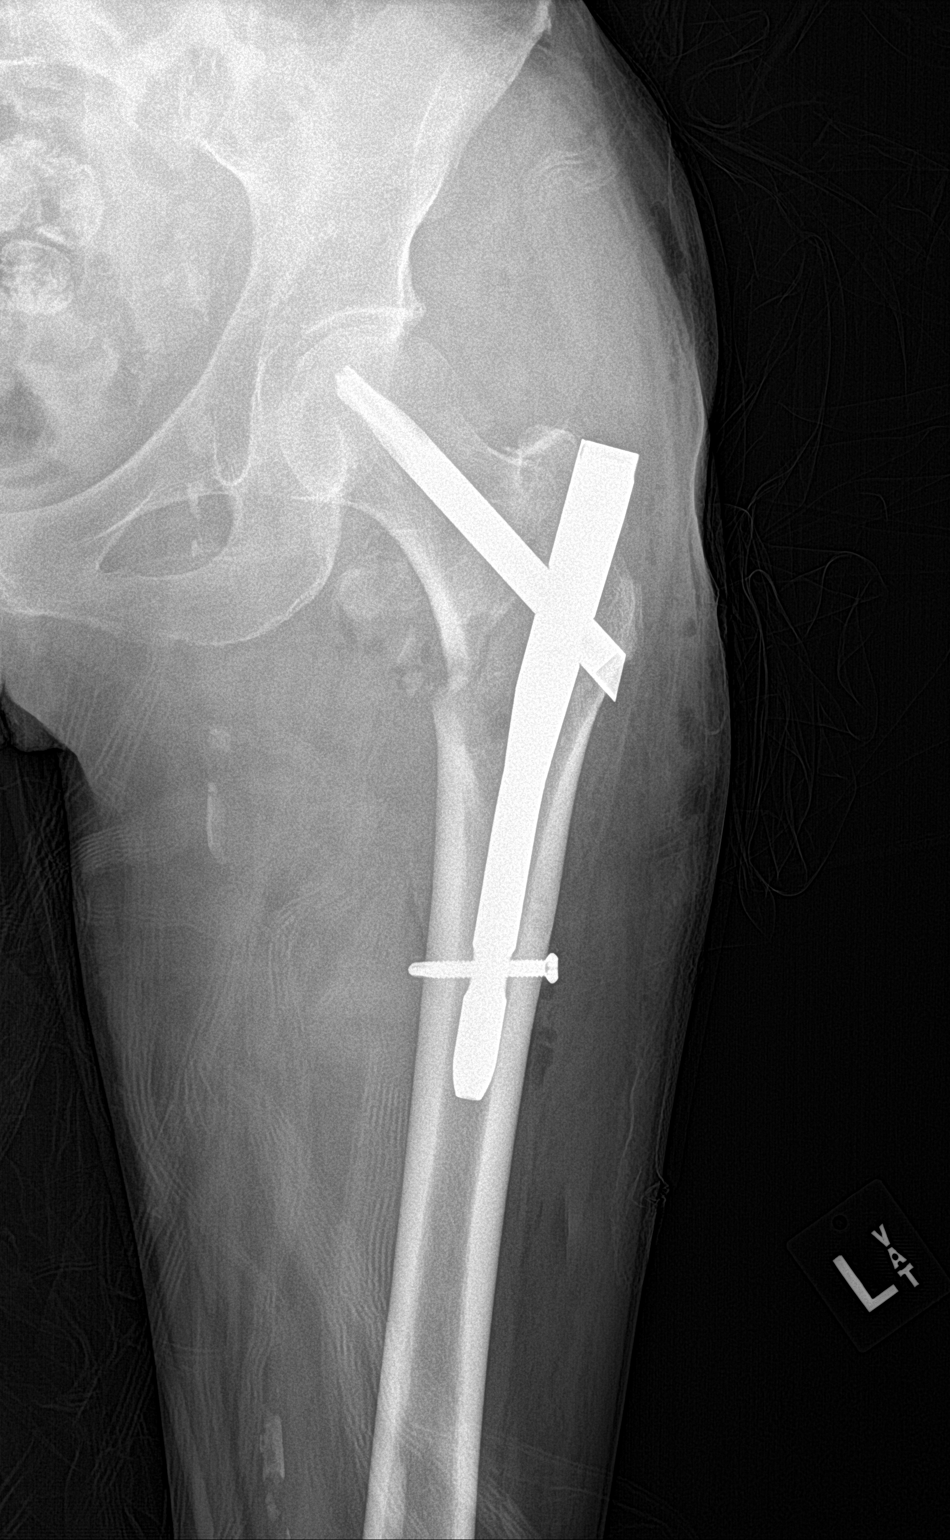
[im 2/2]
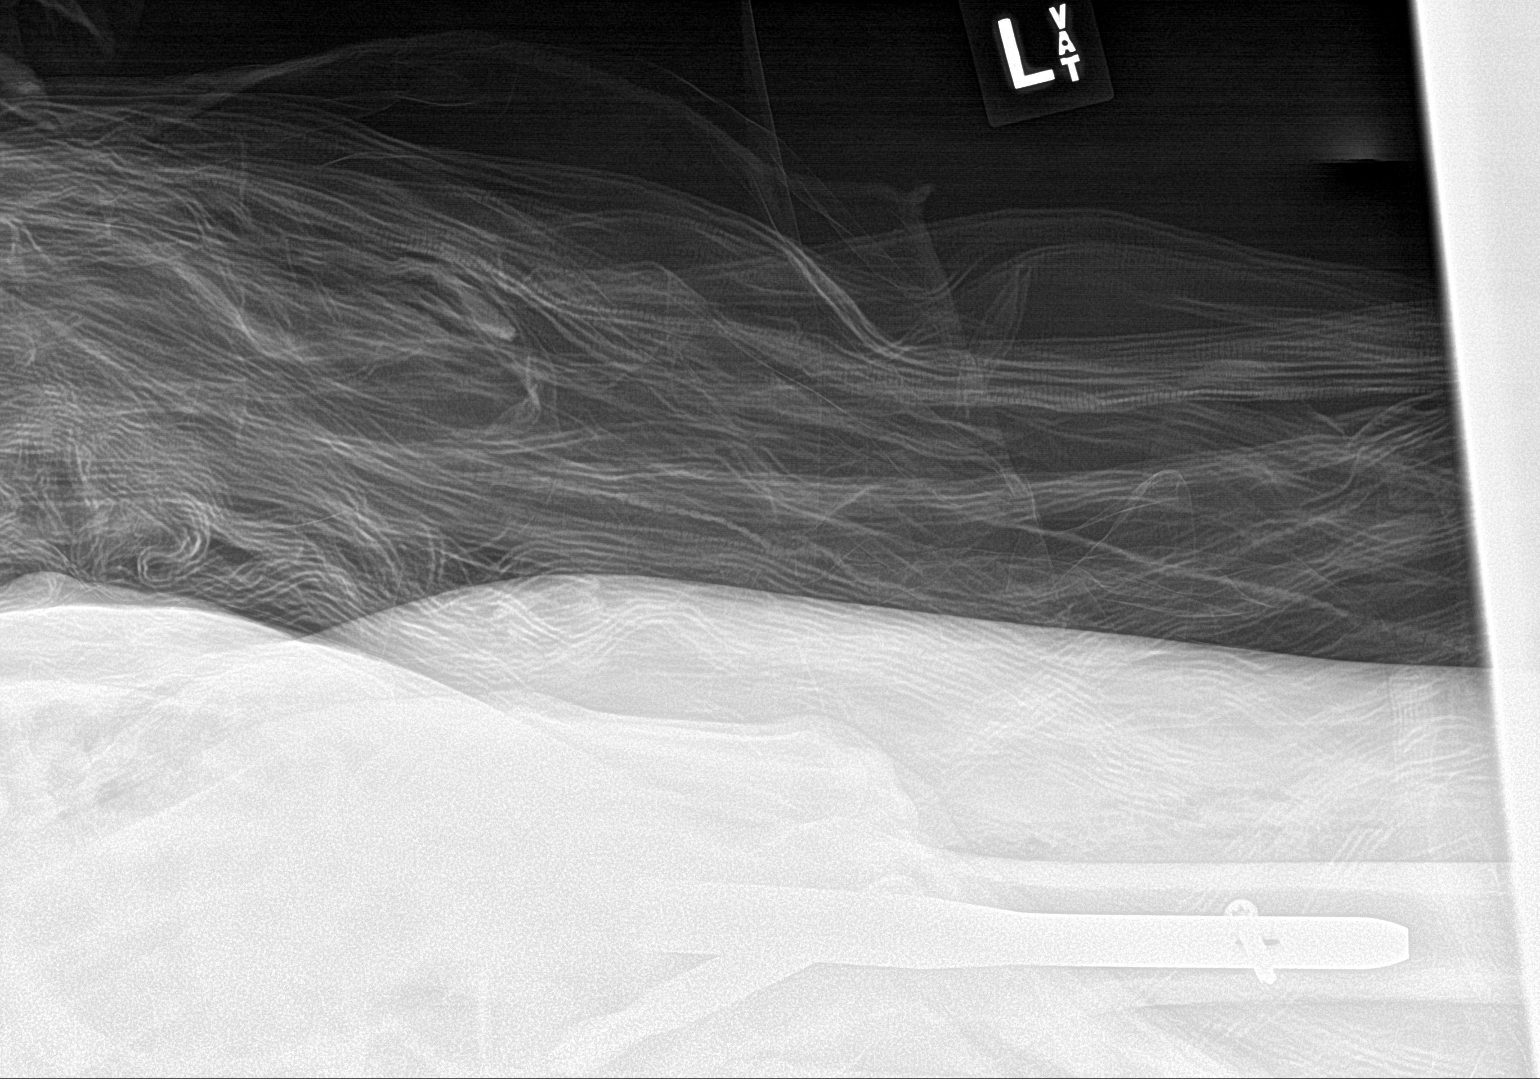

[2 of 2 positions shown; findings below may reference images not displayed]

FINDINGS: Status post intramedullary nail and lag screw fixation of an
intertrochanteric fracture of the LEFT hip. There is no dislocation
or interval fracture. Expected postoperative soft tissue gas noted.
IMPRESSION: Status post ORIF of the LEFT hip.

## 2018-03-18 IMAGING — RF DG HIP (WITH PELVIS) OPERATIVE*L*
1 series · 5 of 5 positions shown · non-contrast
Comparison: Left hip radiographs-earlier same day

CLINICAL DATA: Left intratrochanteric intramedullary rod

EXAM:
DG C-ARM 61-120 MIN; OPERATIVE LEFT HIP WITH PELVIS
FLUOROSCOPY TIME:  166 seconds

[Series 1: run · 5 of 5 slices shown]
[im 1/5]
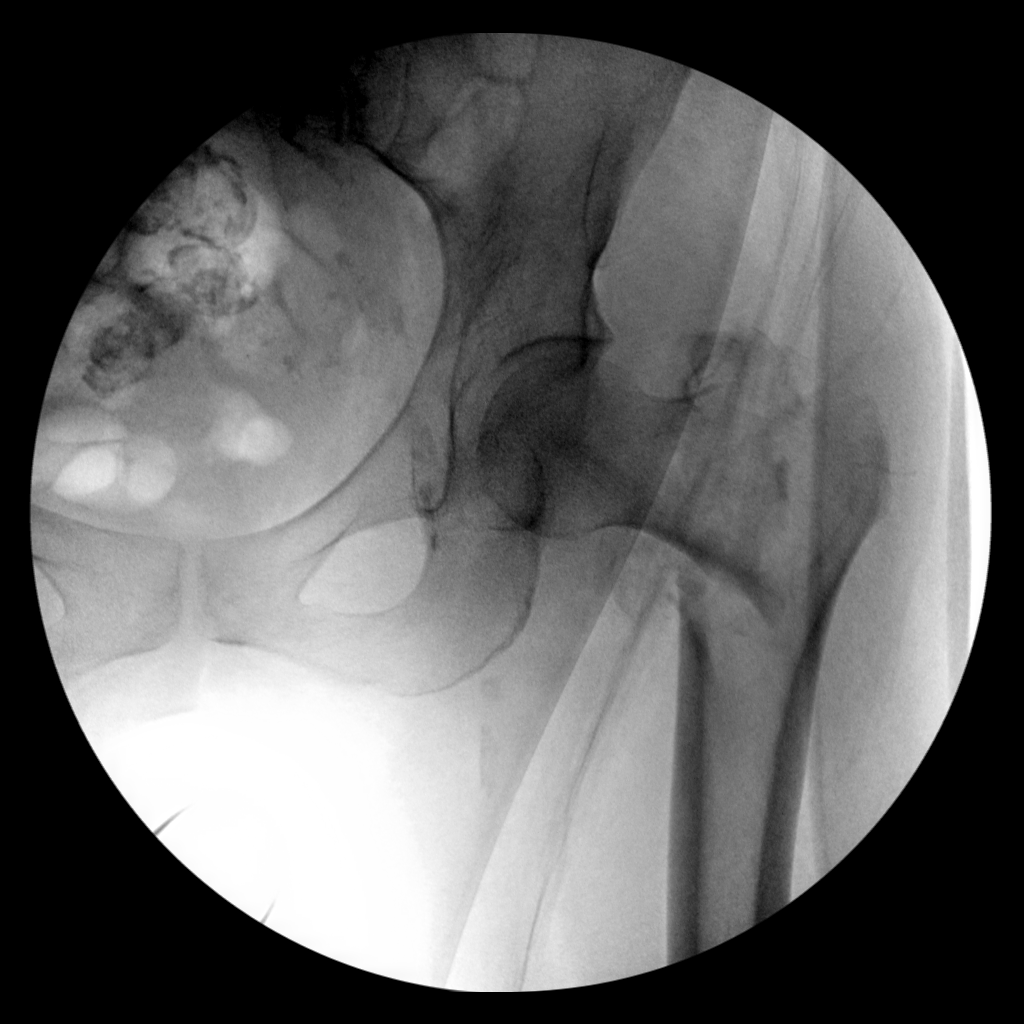
[im 2/5]
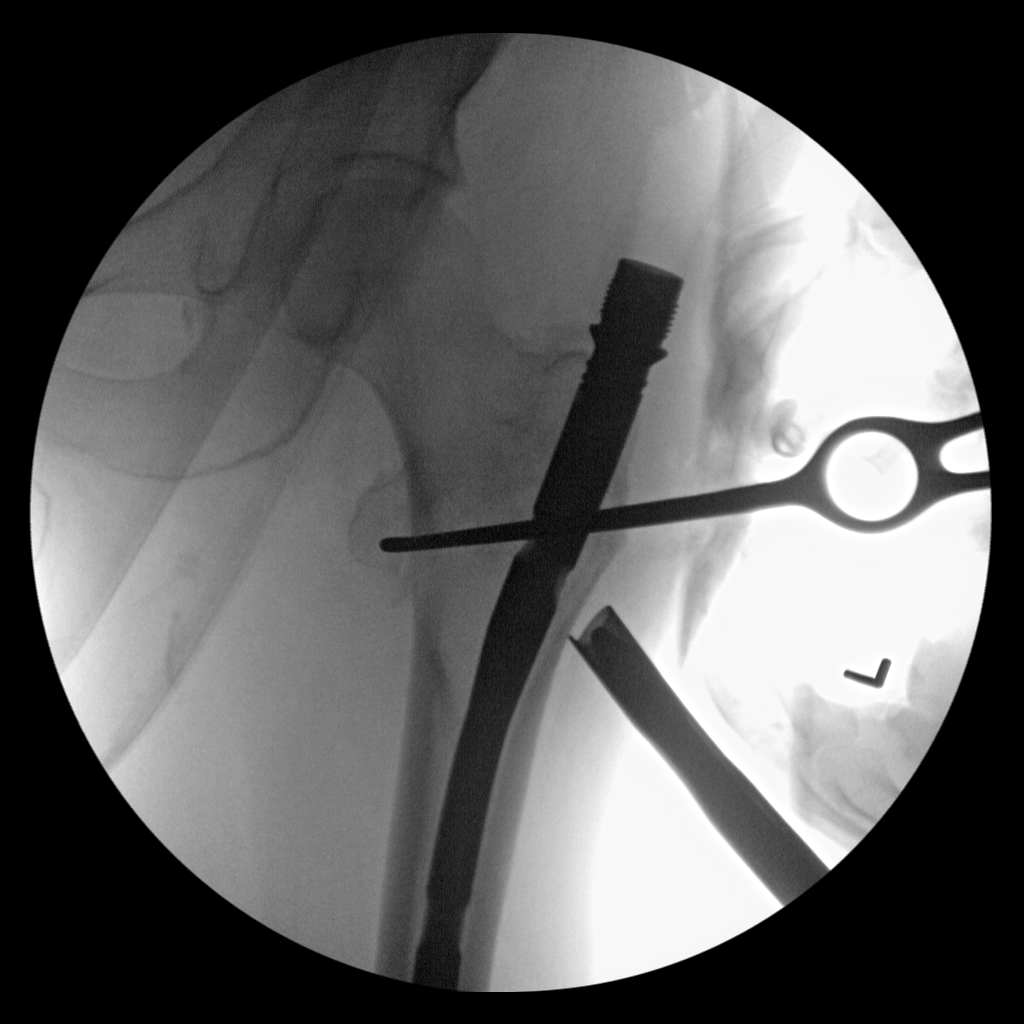
[im 3/5]
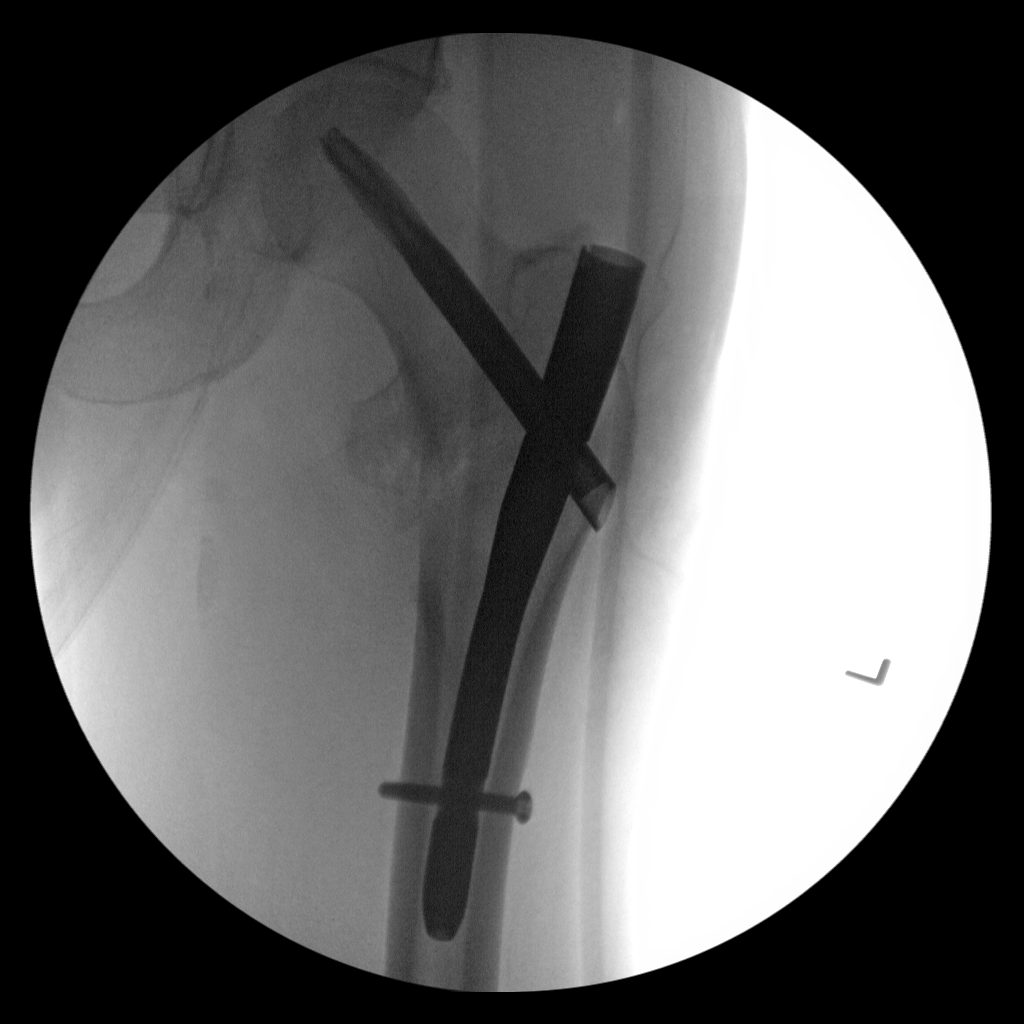
[im 4/5]
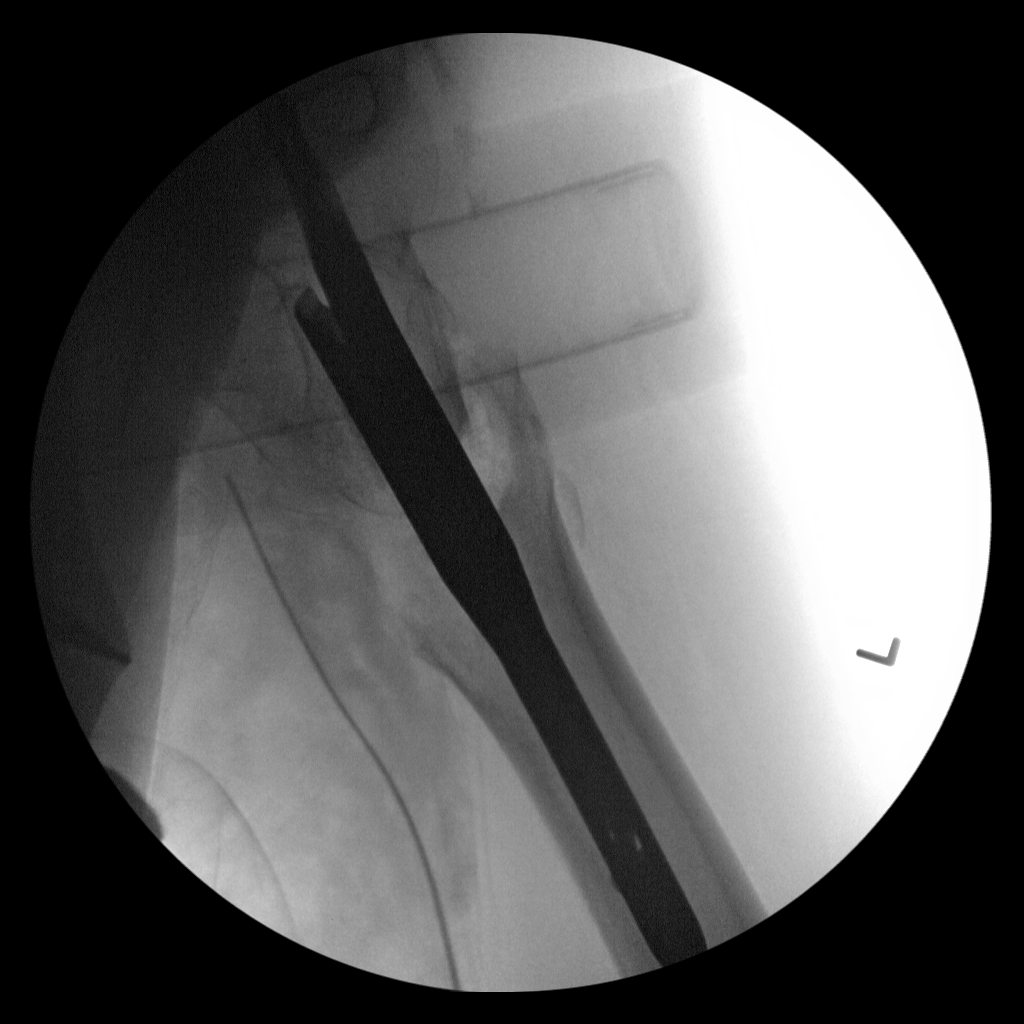
[im 5/5]
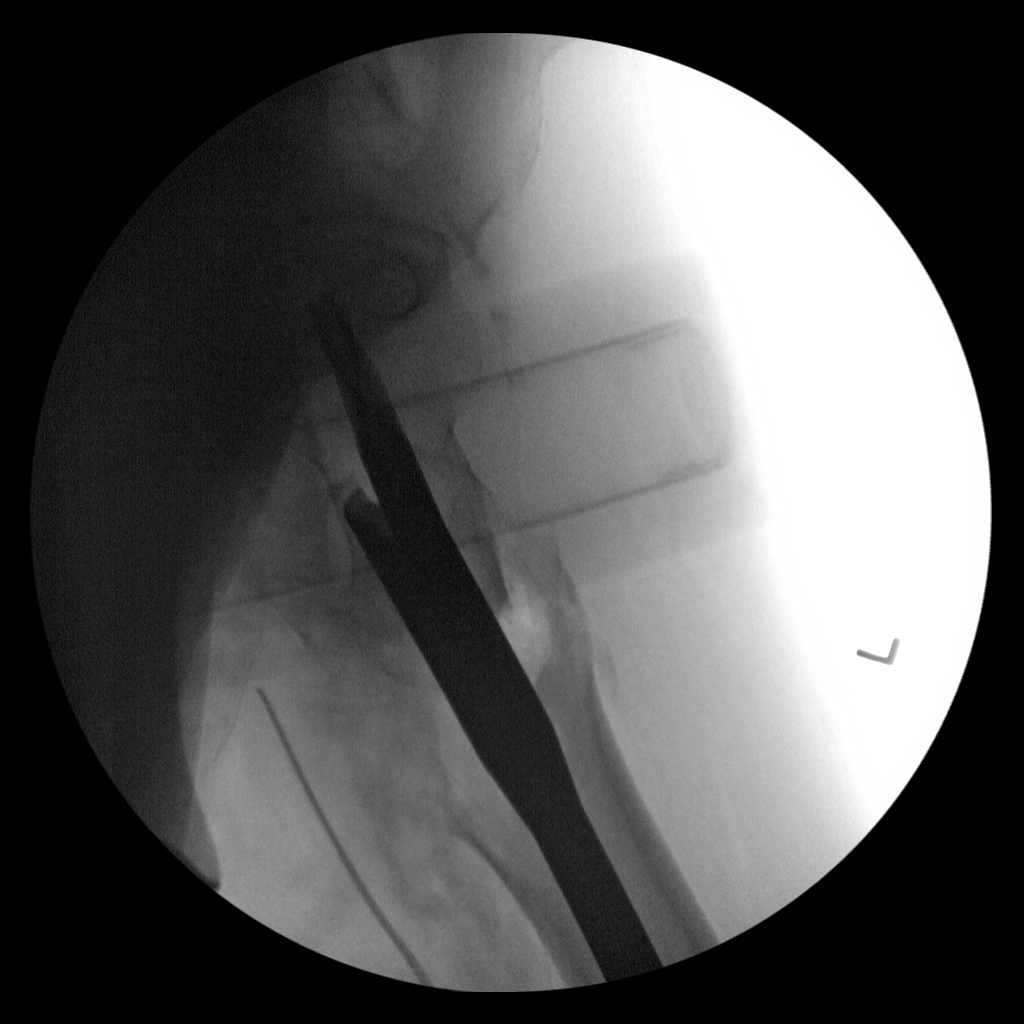

[5 of 5 positions shown; findings below may reference images not displayed]

FINDINGS: Five spot intraoperative fluoroscopic images of the right hip are
provided for review.

Images demonstrate the sequela intramedullary rod fixation of the
left femur and dynamic screw fixation of the left femoral neck. The
distal end of the femoral intramedullary rod is transfixed with a
single cancellous screw.

Alignment appears markedly improved.

Scattered foci of subcutaneous emphysema about the operative site.
No radiopaque foreign body.
IMPRESSION: Post ORIF of left intertrochanteric femur fracture as above.

## 2018-03-18 IMAGING — DX DG HIP (WITH OR WITHOUT PELVIS) 2-3V*L*
3 series · 3 of 3 positions shown · non-contrast
Comparison: None.

CLINICAL DATA: Left hip pain and deformity after fall.

EXAM:
DG HIP (WITH OR WITHOUT PELVIS) 2-3V LEFT

[pelvis ap]
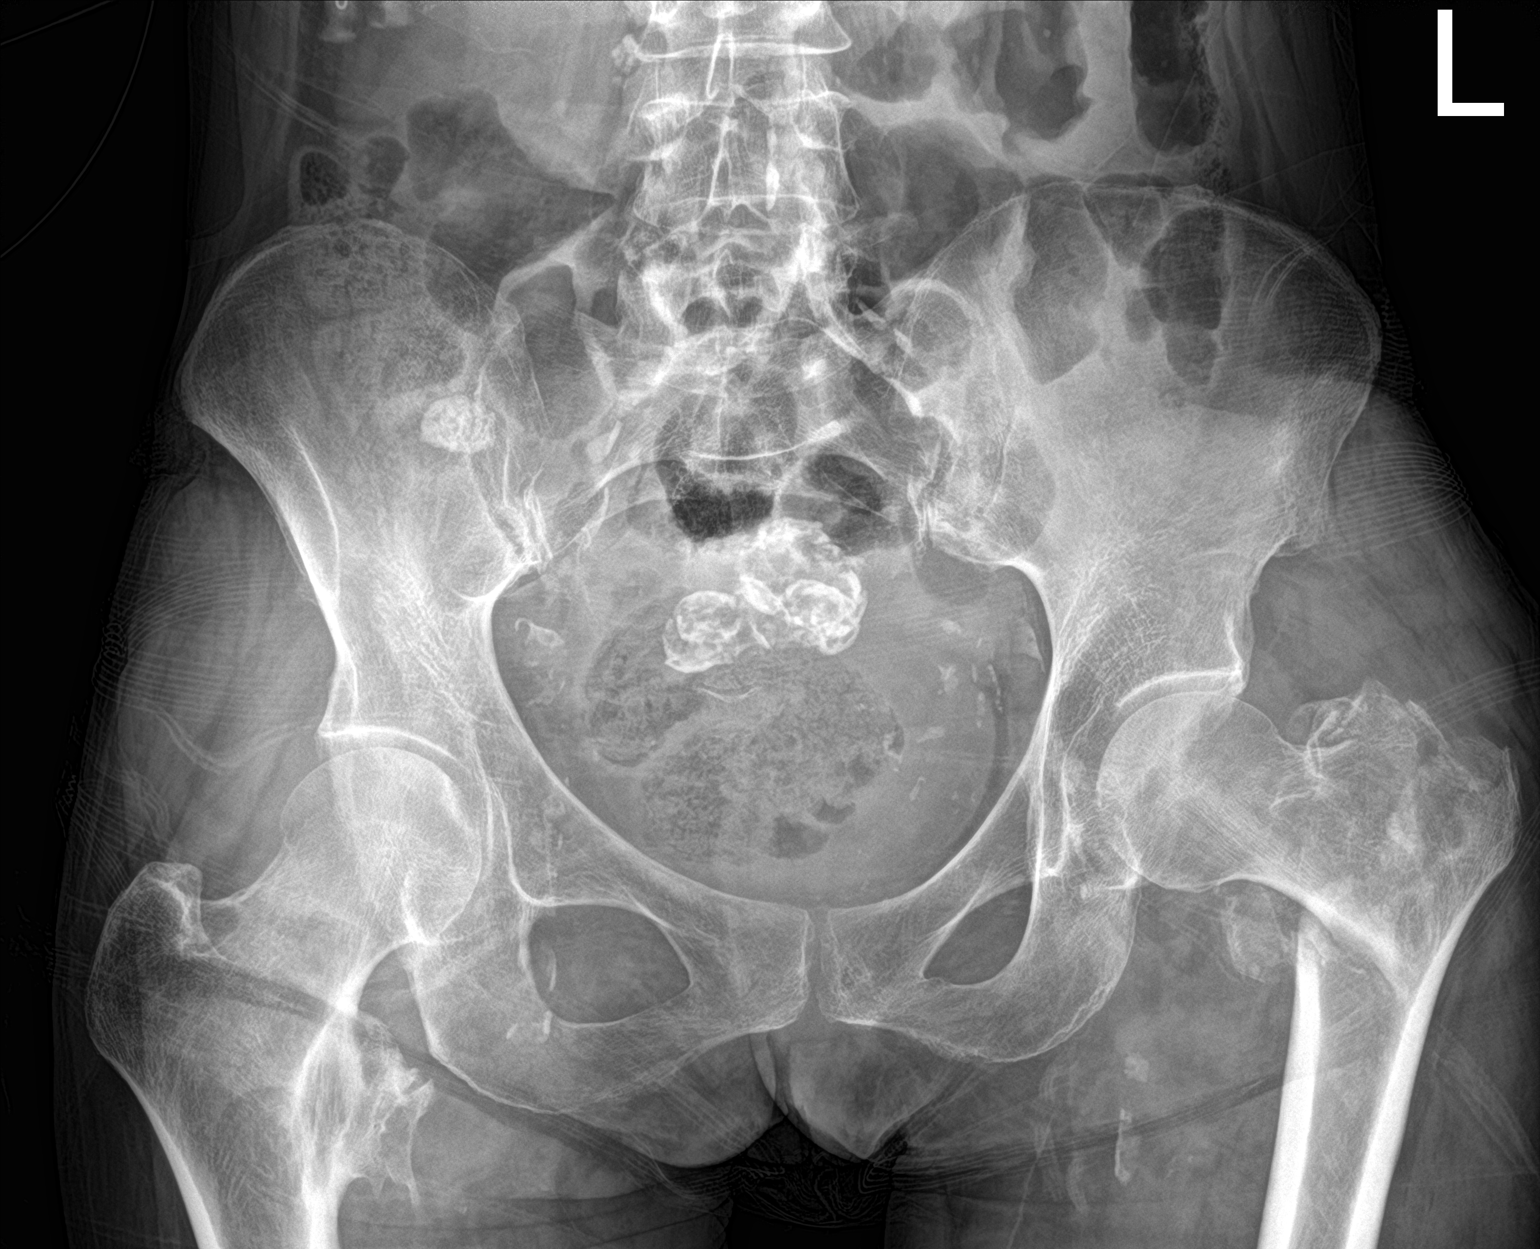

[hip ap]
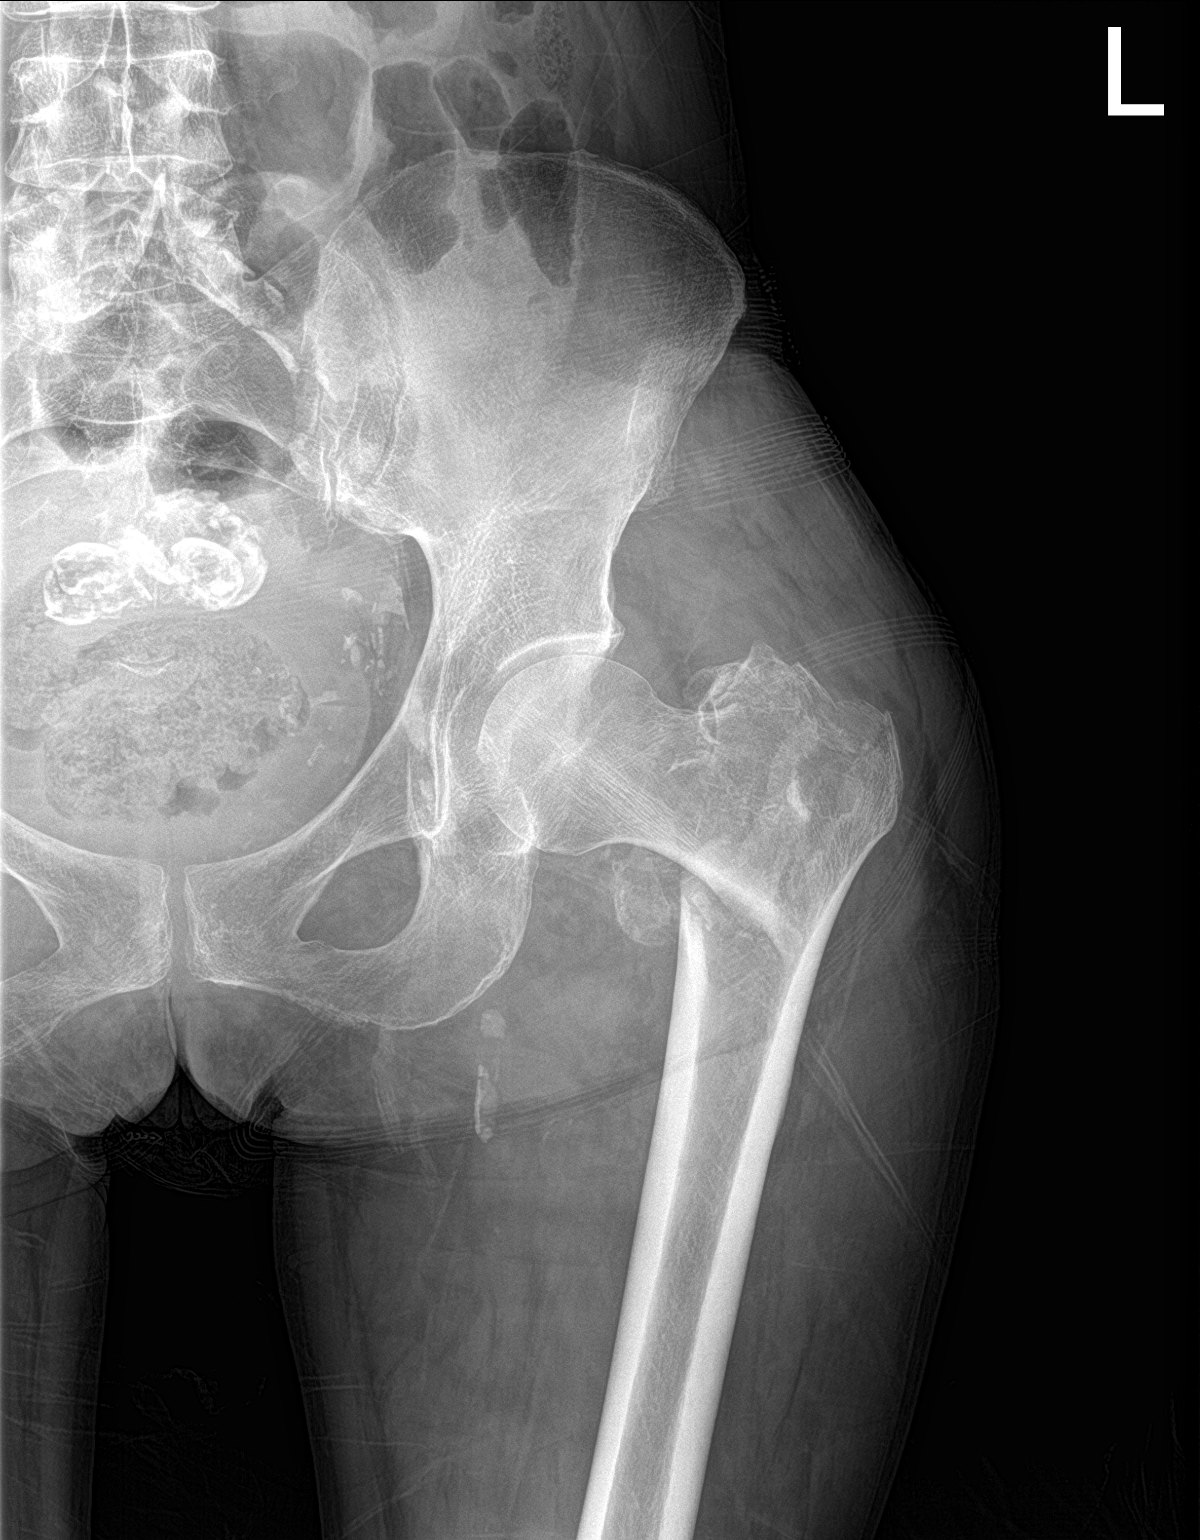

[hip lat]
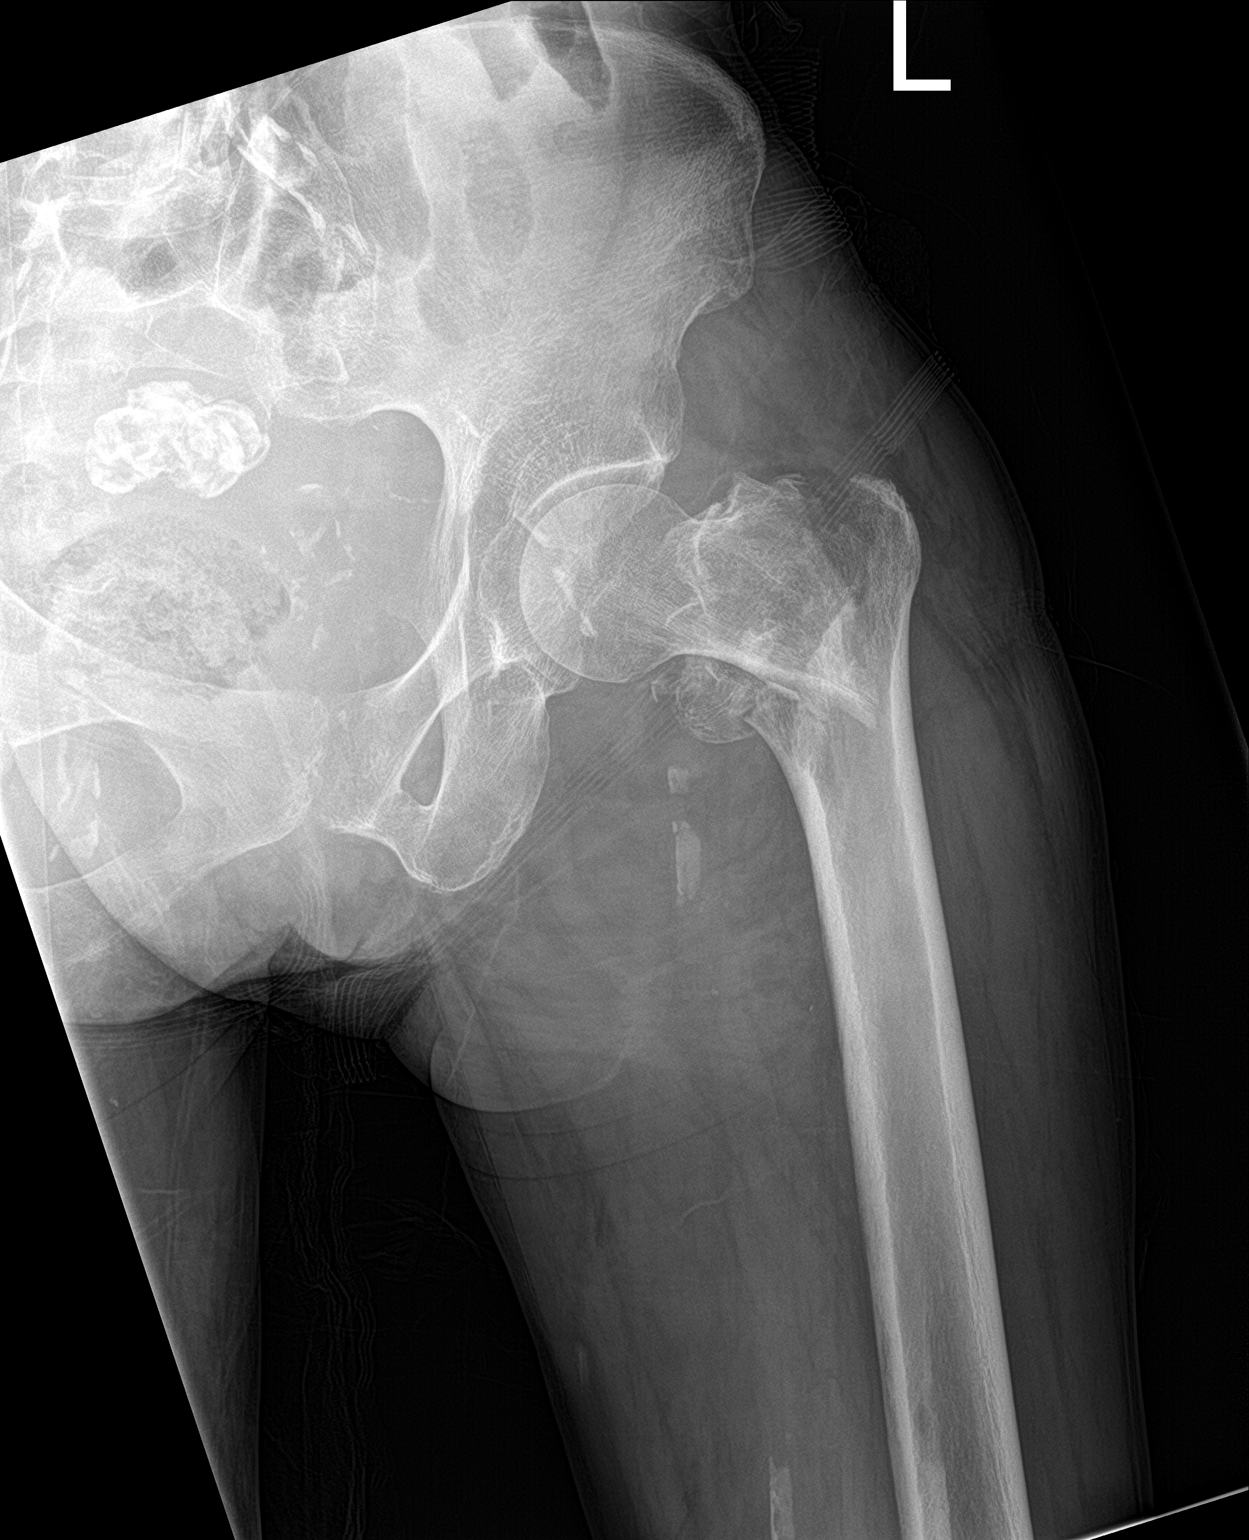

[3 of 3 positions shown; findings below may reference images not displayed]

FINDINGS: Comminuted and displaced intertrochanteric left proximal femur
fracture with proximal migration of the femoral shaft. Displacement
involves the both lesser and greater trochanters. The femoral head
remains seated. Pubic rami are intact. No additional pelvic
fracture. Pelvic calcifications likely fibroids. There are also
vascular calcifications.
IMPRESSION: Comminuted displaced intertrochanteric left proximal femur fracture
with proximal migration of the femoral shaft.

## 2018-03-18 IMAGING — DX DG CHEST 1V
1 series · 1 of 1 positions shown · non-contrast
Comparison: Chest radiograph January 04, 2004

CLINICAL DATA: Hip pain.

EXAM:
CHEST  1 VIEW

[chest ap]
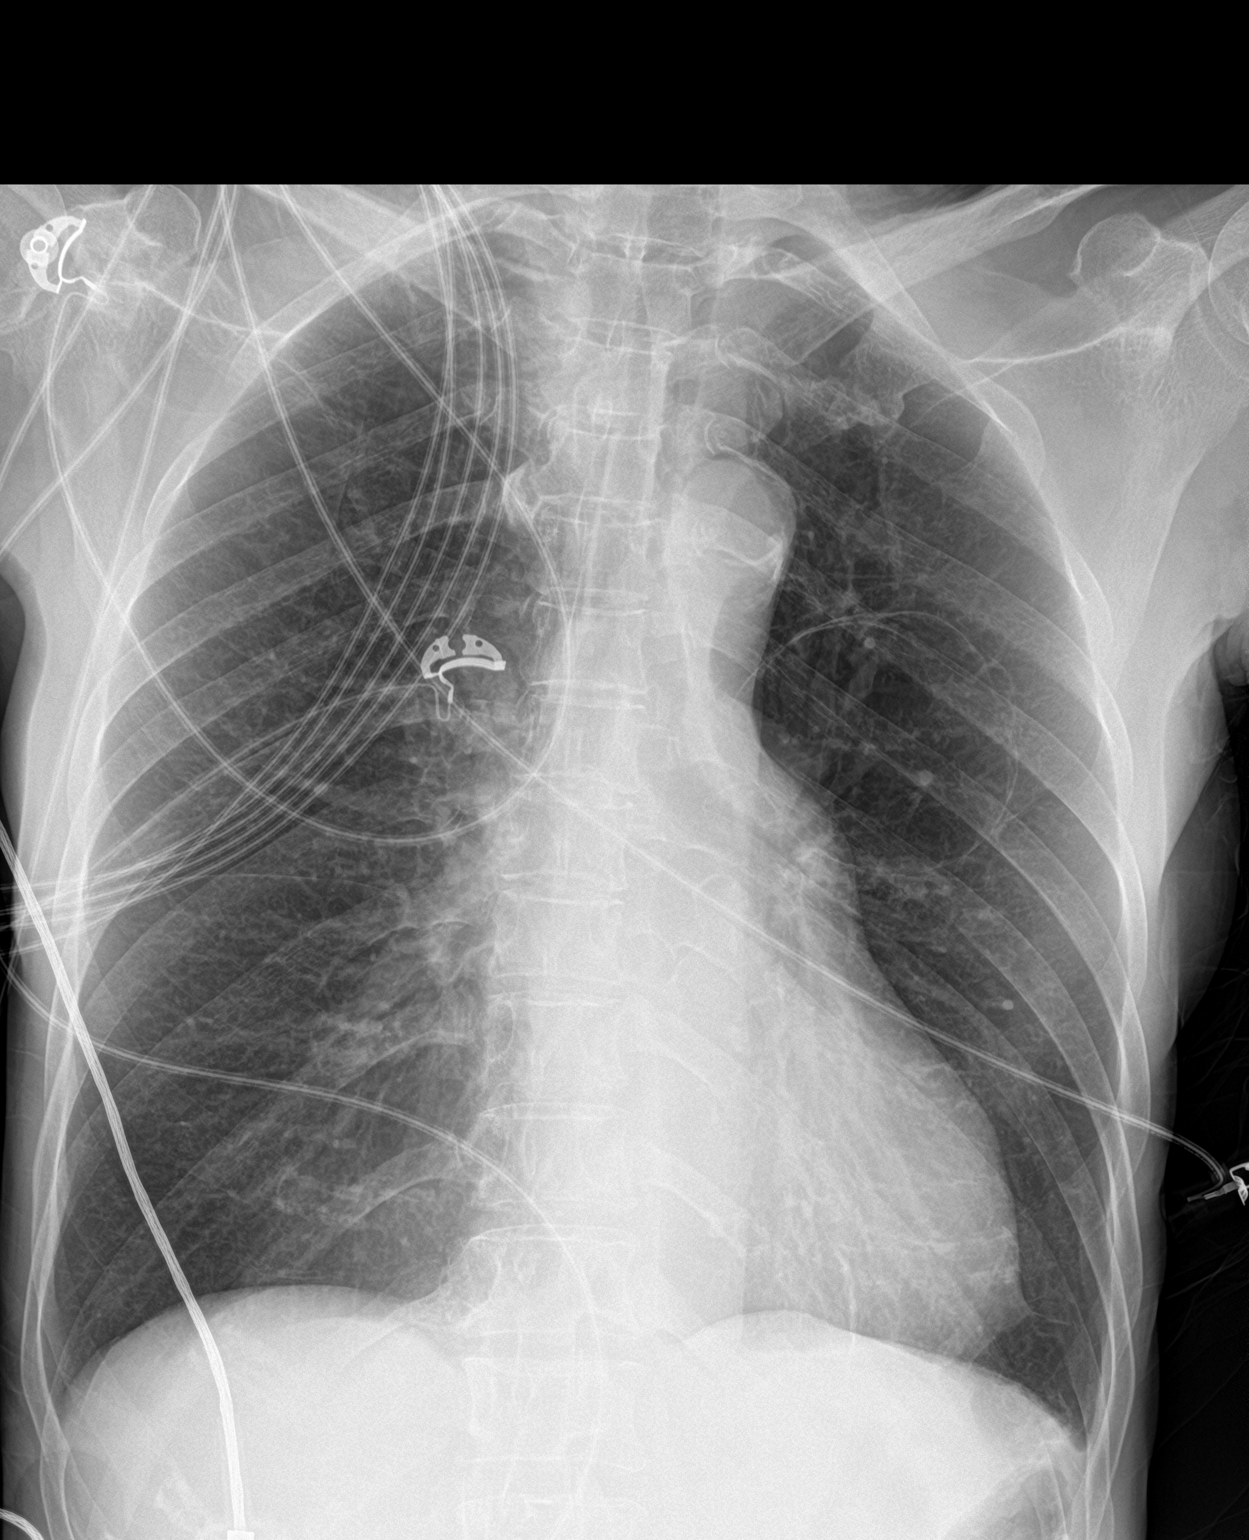

[1 of 1 positions shown; findings below may reference images not displayed]

FINDINGS: Cardiomediastinal silhouette is normal. Calcified aortic arch. No
pleural effusions or focal consolidations. Hyperinflation. Large
bulla LEFT upper lung zone. Trachea projects midline and there is no
pneumothorax. Soft tissue planes and included osseous structures are
non-suspicious. Osteopenia.
IMPRESSION: Hyperinflation without focal consolidation.

Aortic Atherosclerosis (8GI3B-BMS.S).

## 2018-03-18 SURGERY — FIXATION, FRACTURE, INTERTROCHANTERIC, WITH INTRAMEDULLARY ROD
Anesthesia: General | Site: Hip | Laterality: Left

## 2018-03-18 MED ORDER — ONDANSETRON HCL 4 MG/2ML IJ SOLN
4.0000 mg | Freq: Once | INTRAMUSCULAR | Status: AC
  Administered 2018-03-18: 4 mg via INTRAVENOUS

## 2018-03-18 MED ORDER — SODIUM CHLORIDE 0.9 % IV SOLN
INTRAVENOUS | Status: AC
  Administered 2018-03-18: 06:00:00 via INTRAVENOUS

## 2018-03-18 MED ORDER — METHOCARBAMOL 500 MG PO TABS
ORAL_TABLET | ORAL | Status: AC
  Filled 2018-03-18: qty 1

## 2018-03-18 MED ORDER — PROPOFOL 10 MG/ML IV BOLUS
INTRAVENOUS | Status: AC
  Filled 2018-03-18: qty 40

## 2018-03-18 MED ORDER — FENTANYL CITRATE (PF) 250 MCG/5ML IJ SOLN
INTRAMUSCULAR | Status: AC
  Filled 2018-03-18: qty 5

## 2018-03-18 MED ORDER — HYDROMORPHONE HCL 1 MG/ML IJ SOLN
INTRAMUSCULAR | Status: AC
  Administered 2018-03-18: 0.5 mg via INTRAVENOUS
  Filled 2018-03-18: qty 1

## 2018-03-18 MED ORDER — ACETAMINOPHEN 10 MG/ML IV SOLN
INTRAVENOUS | Status: AC
  Filled 2018-03-18: qty 100

## 2018-03-18 MED ORDER — SUGAMMADEX SODIUM 200 MG/2ML IV SOLN
INTRAVENOUS | Status: DC | PRN
  Administered 2018-03-18: 106.6 mg via INTRAVENOUS

## 2018-03-18 MED ORDER — HYDROMORPHONE HCL 1 MG/ML IJ SOLN
0.2500 mg | INTRAMUSCULAR | Status: DC | PRN
Start: 2018-03-18 — End: 2018-03-18
  Administered 2018-03-18 (×4): 0.5 mg via INTRAVENOUS

## 2018-03-18 MED ORDER — FENTANYL CITRATE (PF) 100 MCG/2ML IJ SOLN
INTRAMUSCULAR | Status: AC
  Filled 2018-03-18: qty 2

## 2018-03-18 MED ORDER — ENOXAPARIN SODIUM 40 MG/0.4ML ~~LOC~~ SOLN
40.0000 mg | SUBCUTANEOUS | Status: DC
  Administered 2018-03-19 – 2018-03-22 (×4): 40 mg via SUBCUTANEOUS
  Filled 2018-03-18 (×4): qty 0.4

## 2018-03-18 MED ORDER — LORAZEPAM 2 MG/ML IJ SOLN: 0.0000 mg | Freq: Two times a day (BID) | INTRAMUSCULAR | Status: AC

## 2018-03-18 MED ORDER — ALBUMIN HUMAN 5 % IV SOLN
INTRAVENOUS | Status: DC | PRN
  Administered 2018-03-18: 12:00:00 via INTRAVENOUS

## 2018-03-18 MED ORDER — LORAZEPAM 2 MG/ML IJ SOLN: 1.0000 mg | Freq: Four times a day (QID) | INTRAMUSCULAR | Status: AC | PRN

## 2018-03-18 MED ORDER — LIDOCAINE HCL (CARDIAC) PF 100 MG/5ML IV SOSY
PREFILLED_SYRINGE | INTRAVENOUS | Status: DC | PRN
  Administered 2018-03-18: 50 mg via INTRAVENOUS

## 2018-03-18 MED ORDER — MORPHINE SULFATE (PF) 4 MG/ML IV SOLN
1.0000 mg | INTRAVENOUS | Status: DC | PRN
  Administered 2018-03-18: 2 mg via INTRAVENOUS
  Administered 2018-03-18 (×2): 1 mg via INTRAVENOUS
  Administered 2018-03-19: 2 mg via INTRAVENOUS
  Filled 2018-03-18 (×4): qty 1

## 2018-03-18 MED ORDER — ONDANSETRON HCL 4 MG/2ML IJ SOLN: 4.0000 mg | Freq: Three times a day (TID) | INTRAMUSCULAR | Status: DC | PRN

## 2018-03-18 MED ORDER — CEFAZOLIN SODIUM-DEXTROSE 2-4 GM/100ML-% IV SOLN
2.0000 g | Freq: Three times a day (TID) | INTRAVENOUS | Status: AC
  Administered 2018-03-18 – 2018-03-19 (×3): 2 g via INTRAVENOUS
  Filled 2018-03-18 (×3): qty 100

## 2018-03-18 MED ORDER — DEXAMETHASONE SODIUM PHOSPHATE 10 MG/ML IJ SOLN
INTRAMUSCULAR | Status: DC | PRN
  Administered 2018-03-18: 10 mg via INTRAVENOUS

## 2018-03-18 MED ORDER — ENSURE SURGERY PO LIQD
237.0000 mL | Freq: Three times a day (TID) | ORAL | Status: DC
  Administered 2018-03-18 – 2018-03-22 (×11): 237 mL via ORAL
  Filled 2018-03-18 (×15): qty 237

## 2018-03-18 MED ORDER — PROPOFOL 10 MG/ML IV BOLUS
INTRAVENOUS | Status: DC | PRN
  Administered 2018-03-18: 100 mg via INTRAVENOUS

## 2018-03-18 MED ORDER — MIDAZOLAM HCL 2 MG/2ML IJ SOLN
INTRAMUSCULAR | Status: AC
Start: 2018-03-18 — End: 2018-03-18
  Administered 2018-03-18: 1 mg via INTRAVENOUS
  Filled 2018-03-18: qty 2

## 2018-03-18 MED ORDER — CEFAZOLIN SODIUM-DEXTROSE 2-4 GM/100ML-% IV SOLN
2.0000 g | INTRAVENOUS | Status: AC
Start: 2018-03-18 — End: 2018-03-18
  Administered 2018-03-18: 2 g via INTRAVENOUS

## 2018-03-18 MED ORDER — FENTANYL CITRATE (PF) 100 MCG/2ML IJ SOLN
INTRAMUSCULAR | Status: DC | PRN
  Administered 2018-03-18: 100 ug via INTRAVENOUS
  Administered 2018-03-18 (×3): 50 ug via INTRAVENOUS

## 2018-03-18 MED ORDER — SENNOSIDES-DOCUSATE SODIUM 8.6-50 MG PO TABS
1.0000 | ORAL_TABLET | Freq: Every evening | ORAL | Status: DC | PRN
  Administered 2018-03-19 – 2018-03-21 (×3): 1 via ORAL
  Filled 2018-03-18 (×4): qty 1

## 2018-03-18 MED ORDER — HYDROMORPHONE HCL 1 MG/ML IJ SOLN: 0.5000 mg | INTRAMUSCULAR | Status: DC | PRN

## 2018-03-18 MED ORDER — ACETAMINOPHEN 10 MG/ML IV SOLN
INTRAVENOUS | Status: DC | PRN
  Administered 2018-03-18: 1000 mg via INTRAVENOUS

## 2018-03-18 MED ORDER — FENTANYL CITRATE (PF) 100 MCG/2ML IJ SOLN
50.0000 ug | INTRAMUSCULAR | Status: DC | PRN
  Administered 2018-03-18: 50 ug via INTRAVENOUS

## 2018-03-18 MED ORDER — ONDANSETRON HCL 4 MG/2ML IJ SOLN
INTRAMUSCULAR | Status: DC | PRN
Start: 2018-03-18 — End: 2018-03-18
  Administered 2018-03-18: 4 mg via INTRAVENOUS

## 2018-03-18 MED ORDER — VITAMIN B-1 100 MG PO TABS
100.0000 mg | ORAL_TABLET | Freq: Every day | ORAL | Status: DC
  Administered 2018-03-18 – 2018-03-22 (×5): 100 mg via ORAL
  Filled 2018-03-18 (×5): qty 1

## 2018-03-18 MED ORDER — ADULT MULTIVITAMIN W/MINERALS CH
1.0000 | ORAL_TABLET | Freq: Every day | ORAL | Status: DC
  Administered 2018-03-18 – 2018-03-22 (×5): 1 via ORAL
  Filled 2018-03-18 (×5): qty 1

## 2018-03-18 MED ORDER — ONDANSETRON HCL 4 MG/2ML IJ SOLN
INTRAMUSCULAR | Status: AC
  Filled 2018-03-18: qty 2

## 2018-03-18 MED ORDER — FENTANYL CITRATE (PF) 100 MCG/2ML IJ SOLN
50.0000 ug | INTRAMUSCULAR | Status: DC | PRN
  Administered 2018-03-18: 50 ug via INTRAVENOUS
  Filled 2018-03-18: qty 2

## 2018-03-18 MED ORDER — HYDRALAZINE HCL 20 MG/ML IJ SOLN: 5.0000 mg | INTRAMUSCULAR | Status: DC | PRN

## 2018-03-18 MED ORDER — FENTANYL CITRATE (PF) 100 MCG/2ML IJ SOLN
50.0000 ug | Freq: Once | INTRAMUSCULAR | Status: AC
  Administered 2018-03-18: 50 ug via INTRAVENOUS

## 2018-03-18 MED ORDER — 0.9 % SODIUM CHLORIDE (POUR BTL) OPTIME
TOPICAL | Status: DC | PRN
  Administered 2018-03-18: 1000 mL

## 2018-03-18 MED ORDER — ALBUTEROL SULFATE (2.5 MG/3ML) 0.083% IN NEBU: 2.5000 mg | INHALATION_SOLUTION | RESPIRATORY_TRACT | Status: AC | PRN

## 2018-03-18 MED ORDER — LORAZEPAM 1 MG PO TABS: 1.0000 mg | ORAL_TABLET | Freq: Four times a day (QID) | ORAL | Status: AC | PRN

## 2018-03-18 MED ORDER — LACTATED RINGERS IV SOLN
INTRAVENOUS | Status: DC
  Administered 2018-03-18 (×2): via INTRAVENOUS

## 2018-03-18 MED ORDER — THIAMINE HCL 100 MG/ML IJ SOLN
100.0000 mg | Freq: Every day | INTRAMUSCULAR | Status: DC
  Filled 2018-03-18: qty 2

## 2018-03-18 MED ORDER — CEFAZOLIN SODIUM-DEXTROSE 2-4 GM/100ML-% IV SOLN
INTRAVENOUS | Status: AC
  Filled 2018-03-18: qty 100

## 2018-03-18 MED ORDER — FOLIC ACID 1 MG PO TABS
1.0000 mg | ORAL_TABLET | Freq: Every day | ORAL | Status: DC
  Administered 2018-03-18 – 2018-03-22 (×5): 1 mg via ORAL
  Filled 2018-03-18 (×5): qty 1

## 2018-03-18 MED ORDER — FENTANYL CITRATE (PF) 100 MCG/2ML IJ SOLN
INTRAMUSCULAR | Status: AC
  Administered 2018-03-18: 50 ug via INTRAVENOUS
  Filled 2018-03-18: qty 2

## 2018-03-18 MED ORDER — METHOCARBAMOL 1000 MG/10ML IJ SOLN
500.0000 mg | Freq: Four times a day (QID) | INTRAVENOUS | Status: DC | PRN
  Filled 2018-03-18: qty 5

## 2018-03-18 MED ORDER — HYDRALAZINE HCL 20 MG/ML IJ SOLN
INTRAMUSCULAR | Status: AC
  Filled 2018-03-18: qty 1

## 2018-03-18 MED ORDER — MIDAZOLAM HCL 2 MG/2ML IJ SOLN
1.0000 mg | Freq: Once | INTRAMUSCULAR | Status: AC
  Administered 2018-03-18: 1 mg via INTRAVENOUS

## 2018-03-18 MED ORDER — NICOTINE 14 MG/24HR TD PT24
14.0000 mg | MEDICATED_PATCH | Freq: Every day | TRANSDERMAL | Status: DC
  Administered 2018-03-18 – 2018-03-22 (×5): 14 mg via TRANSDERMAL
  Filled 2018-03-18 (×5): qty 1

## 2018-03-18 MED ORDER — METHOCARBAMOL 500 MG PO TABS
500.0000 mg | ORAL_TABLET | Freq: Four times a day (QID) | ORAL | Status: DC | PRN
  Administered 2018-03-18 – 2018-03-22 (×10): 500 mg via ORAL
  Filled 2018-03-18 (×10): qty 1

## 2018-03-18 MED ORDER — SODIUM CHLORIDE 0.9 % IV SOLN
INTRAVENOUS | Status: DC | PRN
  Administered 2018-03-18: 10 ug/min via INTRAVENOUS

## 2018-03-18 MED ORDER — ROCURONIUM BROMIDE 100 MG/10ML IV SOLN
INTRAVENOUS | Status: DC | PRN
  Administered 2018-03-18: 50 mg via INTRAVENOUS

## 2018-03-18 MED ORDER — LORAZEPAM 2 MG/ML IJ SOLN: 0.0000 mg | Freq: Four times a day (QID) | INTRAMUSCULAR | Status: AC

## 2018-03-18 SURGICAL SUPPLY — 42 items
BIT DRILL FLUTED FEMUR 4.2/3 (BIT) ×2 IMPLANT
BLADE HELICAL TFNA STRL (Anchor) ×2 IMPLANT
BRUSH SCRUB SURG 4.25 DISP (MISCELLANEOUS) ×4 IMPLANT
CHLORAPREP W/TINT 26ML (MISCELLANEOUS) ×3 IMPLANT
COVER PERINEAL POST (MISCELLANEOUS) ×3 IMPLANT
COVER SURGICAL LIGHT HANDLE (MISCELLANEOUS) ×3 IMPLANT
DERMABOND ADVANCED (GAUZE/BANDAGES/DRESSINGS) ×2
DERMABOND ADVANCED .7 DNX12 (GAUZE/BANDAGES/DRESSINGS) ×1 IMPLANT
DRAPE HALF SHEET 40X57 (DRAPES) ×2 IMPLANT
DRAPE IMP U-DRAPE 54X76 (DRAPES) ×3 IMPLANT
DRAPE INCISE IOBAN 66X45 STRL (DRAPES) ×3 IMPLANT
DRAPE STERI IOBAN 125X83 (DRAPES) ×3 IMPLANT
DRAPE SURG 17X23 STRL (DRAPES) ×2 IMPLANT
DRAPE U-SHAPE 47X51 STRL (DRAPES) ×3 IMPLANT
DRSG MEPILEX BORDER 4X4 (GAUZE/BANDAGES/DRESSINGS) ×3 IMPLANT
DRSG MEPILEX BORDER 4X8 (GAUZE/BANDAGES/DRESSINGS) ×3 IMPLANT
ELECT REM PT RETURN 9FT ADLT (ELECTROSURGICAL) ×3
ELECTRODE REM PT RTRN 9FT ADLT (ELECTROSURGICAL) ×1 IMPLANT
GLOVE BIO SURGEON STRL SZ7.5 (GLOVE) ×12 IMPLANT
GLOVE BIOGEL PI IND STRL 7.5 (GLOVE) ×1 IMPLANT
GLOVE BIOGEL PI INDICATOR 7.5 (GLOVE) ×2
GOWN STRL REUS W/ TWL LRG LVL3 (GOWN DISPOSABLE) ×1 IMPLANT
GOWN STRL REUS W/TWL LRG LVL3 (GOWN DISPOSABLE) ×2
GUIDEWIRE 3.2X400 (WIRE) ×4 IMPLANT
IMPL DEG TI CANN 11MM/130 (Orthopedic Implant) IMPLANT
IMPLANT DEG TI CANN 11MM/130 (Orthopedic Implant) ×3 IMPLANT
KIT BASIN OR (CUSTOM PROCEDURE TRAY) ×3 IMPLANT
KIT TURNOVER KIT B (KITS) ×3 IMPLANT
LINER BOOT UNIVERSAL DISP (MISCELLANEOUS) ×1 IMPLANT
MANIFOLD NEPTUNE II (INSTRUMENTS) ×1 IMPLANT
NS IRRIG 1000ML POUR BTL (IV SOLUTION) ×3 IMPLANT
PACK GENERAL/GYN (CUSTOM PROCEDURE TRAY) ×3 IMPLANT
PAD ARMBOARD 7.5X6 YLW CONV (MISCELLANEOUS) ×8 IMPLANT
SCREW LOCKING 5.0X38MM (Screw) ×2 IMPLANT
SUT MNCRL AB 3-0 PS2 18 (SUTURE) ×3 IMPLANT
SUT VIC AB 0 CT1 27 (SUTURE)
SUT VIC AB 0 CT1 27XBRD ANBCTR (SUTURE) IMPLANT
SUT VIC AB 2-0 CT1 27 (SUTURE) ×2
SUT VIC AB 2-0 CT1 TAPERPNT 27 (SUTURE) ×2 IMPLANT
TOWEL OR 17X26 10 PK STRL BLUE (TOWEL DISPOSABLE) ×6 IMPLANT
TRAY FOLEY W/BAG SLVR 14FR (SET/KITS/TRAYS/PACK) ×2 IMPLANT
WATER STERILE IRR 1000ML POUR (IV SOLUTION) ×3 IMPLANT
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
## 2018-03-18 NOTE — Op Note (Signed)
OrthopaedicSurgeryOperativeNote (EYC:[id number]) Date of Surgery: 03/18/2018  Admit Date: 03/18/2018   Diagnoses: Pre-Op Diagnoses: Left intertrochanteric femur fracture   Post-Op Diagnosis: Same  Procedures: CPT 27245-Cephalomedullary nailing of left intertrochanteric femur fracture  Surgeons: Primary: Shona Needles, MD   Location:[facility] OR ROOM 07   AnesthesiaGeneral   Antibiotics:Ancef 2g preop   Tourniquettime:* No tourniquets in log * .  JSHFWYOVZCHYIFOYDX:412 mL   Complications:None  Specimens:None  Implants: Implant Name Type Inv. Item Serial No. Manufacturer Lot No. LRB No. Used Action  IMPLANT DEG TI CANN 11MM/130 - [id number] Orthopedic Implant IMPLANT DEG TI CANN 11MM/130  [employer] [id number] Left 1 Implanted  SCREW LOCKING 5.0X38MM - [id number] Screw SCREW LOCKING 5.0X38MM  [employer] [id number] Left 1 Implanted    IndicationsforSurgery: 62 year old female with a history of hypertension, asthma and alcohol use with a displaced left intertrochanteric femur fracture. Due to her age and ambulatory capabilities I feel that cephalo-medullary fixation of her left hip is most appropriate.  I discussed risks and benefits with the patient. Risks discussed included bleeding requiring blood transfusion, bleeding causing a hematoma, infection, malunion, nonunion, damage to surrounding nerves and blood vessels, pain, hardware prominence or irritation, hardware failure, stiffness, post-traumatic arthritis, DVT/PE, compartment syndrome, and even death. She agreed to proceed and consent was obtained.  Operative Findings: 1. Cephalomedullary nailing of left intertrochanteric femur fracture using [employer] short TFNA 170x2mm  Procedure: The patient was identified in the preoperative holding area. Consent was confirmed with the patient and their family and all questions were answered. The operative extremity was marked after confirmation with the patient and they were  then brought back to the operating room by our anesthesia colleagues. The patient was placed under general anesthesia and then carefully transferred over to a Hana table. The feet were secured into a traction boot and well padded. A post was placed in the groin and traction was pulled on the operative leg. The contralateral leg was positioned out of the way of fluoroscopy and secure . Fluoroscopic images were obtained and traction and manipulation was performed to reduce the fracture. Once adequate reduction was performed then the operative extremity was prepped and draped in sterile fashion. Preincision timeout was performed to verify the patient, the procedure and the extremity. Preoperative antibiotics were dosed.  A small incision was made proximal to the greater trochanter. A curved Mayo scissors was used to spread down to the greater trochanter in line with the abductor musculature. A threaded guidepin was positioned at an appropriate starting point on the AP and lateral views. It was advanced in the femur past the lesser trochanter. A entry reamer with soft tissue protector was then used to enter the canal. A radiographic ruler was used to judge the size of the canal of the femur and a 47mm short nail was placed into the canal and seated down to an appropriate position radiographically. The targeting arm for the helical blade was attached. A percutaneous incision was made for the guide for the helical blade. Another percutaneous incision was made to place a bone hook to reduce the head neck segment. A threaded guidepin was placed into the femoral neck and head and fluoroscopy was used to confirm adequate placement with an acceptable tip-apex distance. A drill was used to perforate the lateral cortex and 546 mm helical blade was inserted into the head/neck segment. The set screw was then tightened to set rotation and backed off to allow compression. The aiming arm was removed from the nail and position  of  the helical blade was confirmed with fluoroscopy. Using the targeting arm a distal interlock was placed bicortically in the shaft.  Final fluoroscopic images were obtained and the incisions were copiously irrigated. The skin was closed with 2-0 vicryl, 3-0 monocryl and sealed with dermabond. The incisions were dressing with Mepilex dressings. The patient was carefully transferred to the regular floor bed and was taken to PACU in stable condition.   Post Op Plan/Instructions: The patient will be touchdown weight bearing. She will receive lovenox for DVT prophylaxis while in the hospital and go home with aspirin. She will receive postoperative Ancef.  I was present and performed the entire surgery.  Katha Hamming, MD [facility]

## 2018-03-18 NOTE — Anesthesia Procedure Notes (Signed)
Procedure Name: Intubation Date/Time: 03/18/2018 11:14 AM Performed by: Adalberto Ill, CRNA Pre-anesthesia Checklist: Patient identified, Emergency Drugs available, Suction available, Patient being monitored and Timeout performed Patient Re-evaluated:Patient Re-evaluated prior to induction Oxygen Delivery Method: Circle system utilized Preoxygenation: Pre-oxygenation with 100% oxygen Induction Type: IV induction Ventilation: Mask ventilation without difficulty Laryngoscope Size: Miller and 2 Grade View: Grade I Tube type: Oral Tube size: 6.5 mm Number of attempts: 1 Airway Equipment and Method: Stylet Placement Confirmation: ETT inserted through vocal cords under direct vision,  positive ETCO2 and breath sounds checked- equal and bilateral Secured at: 21 cm Tube secured with: Tape Dental Injury: Teeth and Oropharynx as per pre-operative assessment

## 2018-03-18 NOTE — Anesthesia Postprocedure Evaluation (Signed)
Anesthesia Post Note  Patient: Veronica Stewart  Procedure(s) Performed: INTRAMEDULLARY (IM) NAIL INTERTROCHANTRIC (Left Hip)     Patient location during evaluation: PACU Anesthesia Type: General Level of consciousness: awake Pain management: pain level controlled Vital Signs Assessment: post-procedure vital signs reviewed and stable Respiratory status: spontaneous breathing Cardiovascular status: stable Postop Assessment: no headache Anesthetic complications: no    Last Vitals:  Vitals:   03/18/18 0500 03/18/18 0559  BP: (!) 140/96 (!) 144/105  Pulse: 86 83  Resp: 14   Temp:  36.8 C  SpO2: 96% 100%    Last Pain:  Vitals:   03/18/18 1100  TempSrc:   PainSc: 2                  Evi Mccomb

## 2018-03-18 NOTE — Progress Notes (Signed)
PROGRESS NOTE    Veronica Stewart  BTD:[id number] DOB: June 16, 1956 DOA: 03/18/2018 PCP: Patient, No Pcp Per    Brief Narrative:  62 year old with past medical history relevant for asthma, alcohol abuse, untreated hypertension who comes in with a mechanical fall and subsequent development of left hip fracture status post nail on 03/18/2018   Assessment & Plan:   Principal Problem:   Closed left hip fracture, initial encounter [facility]) Active Problems:   Hypertension   Alcohol dependence with acute alcoholic intoxication ([place])   Hyponatremia   Asthma   Protein-calorie malnutrition, severe   #) Left hip fracture status post intramedullary nail on 03/18/2017: Patient is currently in the OR. -Orthopedic surgery following, appreciate recommendations -Pain control - Prophylaxis will defer to orthopedic surgery at this time - PT/OT for placement  #) Alcohol abuse with acute alcohol intoxication: Patient reports that she does drink quite a bit but has never had withdrawals or seizures -CIWA protocol -Continue thiamine and folate  #) Hyponatremia: Likely hypovolemic as well as beer portal mania -Continue IV fluids  #) Asthma: -Continue PRN bronchodilators  #) Hypertension: Untreated -Monitor  Fluids: Gentle IV fluids Elect lites: Monitor and supplement Nutrition: Heart healthy diet  Prophylaxis: Enoxaparin  Disposition: Pending orthopedic surgery.  PT Plan full code   Consultants:   Orthopedic surgery  Procedures:   03/18/2018 left intramedullary nail  Antimicrobials:   Perioperative antibiotics   Subjective: She reports that she is in significant pain.  She denies any nausea, vomiting, diarrhea, cough, congestion, rhinorrhea.  She denies any chest pain.  She denies any palpitations.  Objective: Vitals:   03/18/18 0559 03/18/18 1046 03/18/18 1303 03/18/18 1304  BP: (!) 144/105  (!) 195/117 (!) 197/112  Pulse: 83  95 93  Resp:   14 16  Temp: 98.2 F (36.8 C)       TempSrc: Oral     SpO2: 100%  98% 98%  Weight:  53.3 kg (117 lb 8.1 oz)    Height:  5\' 10"  (1.778 m)      Intake/Output Summary (Last 24 hours) at 03/18/2018 1326 Last data filed at 03/18/2018 1257 Gross per 24 hour  Intake 1250 ml  Output 900 ml  Net 350 ml   Filed Weights   03/18/18 0051 03/18/18 1046  Weight: 61.2 kg (135 lb) 53.3 kg (117 lb 8.1 oz)    Examination:  General exam: Uncomfortable appearing Respiratory system: Clear to auscultation. Respiratory effort normal. Cardiovascular system: Regular rate and rhythm, no murmurs Gastrointestinal system: Abdomen is nondistended, soft and nontender. No organomegaly or masses felt. Normal bowel sounds heard. Central nervous system: Alert and oriented. No focal neurological deficits. Extremities: Hesitant to move left leg, able to wiggle toes, intact distal pulses, trace lower extremity edema Skin: No rashes over visible skin Psychiatry: Judgement and insight appear normal. Mood & affect appropriate.     Data Reviewed: I have personally reviewed following labs and imaging studies  CBC: Recent Labs  Lab 03/18/18 0213  WBC 8.9  NEUTROABS 7.6  HGB 14.7  HCT 44.2  MCV 88.4  PLT 106   Basic Metabolic Panel: Recent Labs  Lab 03/18/18 0213  NA 130*  K 4.0  CL 94*  CO2 24  GLUCOSE 111*  BUN 5*  CREATININE 0.71  CALCIUM 8.6*   GFR: Estimated Creatinine Clearance: 62.1 mL/min (by C-G formula based on SCr of 0.71 mg/dL). Liver Function Tests: No results for input(s): AST, ALT, ALKPHOS, BILITOT, PROT, ALBUMIN in the last 168 hours.  No results for input(s): LIPASE, AMYLASE in the last 168 hours. No results for input(s): AMMONIA in the last 168 hours. Coagulation Profile: Recent Labs  Lab 03/18/18 0213  INR 1.07   Cardiac Enzymes: No results for input(s): CKTOTAL, CKMB, CKMBINDEX, TROPONINI in the last 168 hours. BNP (last 3 results) No results for input(s): PROBNP in the last 8760 hours. HbA1C: No results  for input(s): HGBA1C in the last 72 hours. CBG: No results for input(s): GLUCAP in the last 168 hours. Lipid Profile: No results for input(s): CHOL, HDL, LDLCALC, TRIG, CHOLHDL, LDLDIRECT in the last 72 hours. Thyroid Function Tests: No results for input(s): TSH, T4TOTAL, FREET4, T3FREE, THYROIDAB in the last 72 hours. Anemia Panel: No results for input(s): VITAMINB12, FOLATE, FERRITIN, TIBC, IRON, RETICCTPCT in the last 72 hours. Sepsis Labs: No results for input(s): PROCALCITON, LATICACIDVEN in the last 168 hours.  Recent Results (from the past 240 hour(s))  MRSA PCR Screening     Status: None   Collection Time: 03/18/18  6:50 AM  Result Value Ref Range Status   MRSA by PCR NEGATIVE NEGATIVE Final    Comment:        The GeneXpert MRSA Assay (FDA approved for NASAL specimens only), is one component of a comprehensive MRSA colonization surveillance program. It is not intended to diagnose MRSA infection nor to guide or monitor treatment for MRSA infections. Performed at [facility], [place] [address] [place], [place] [address]          Radiology Studies: Dg Chest 1 View  Result Date: 03/18/2018 CLINICAL DATA:  Hip pain. EXAM: CHEST  1 VIEW COMPARISON:  Chest radiograph Jan 04, 2004 FINDINGS: Cardiomediastinal silhouette is normal. Calcified aortic arch. No pleural effusions or focal consolidations. Hyperinflation. Large bulla LEFT upper lung zone. Trachea projects midline and there is no pneumothorax. Soft tissue planes and included osseous structures are non-suspicious. Osteopenia. IMPRESSION: Hyperinflation without focal consolidation. Aortic Atherosclerosis (ICD10-I70.0). Electronically Signed   By: Elon Alas M.D.   On: 03/18/2018 01:52   Dg Hip Unilat With Pelvis 2-3 Views Left  Result Date: 03/18/2018 CLINICAL DATA:  Left hip pain and deformity after fall. EXAM: DG HIP (WITH OR WITHOUT PELVIS) 2-3V LEFT COMPARISON:  None. FINDINGS: Comminuted and displaced  intertrochanteric left proximal femur fracture with proximal migration of the femoral shaft. Displacement involves the both lesser and greater trochanters. The femoral head remains seated. Pubic rami are intact. No additional pelvic fracture. Pelvic calcifications likely fibroids. There are also vascular calcifications. IMPRESSION: Comminuted displaced intertrochanteric left proximal femur fracture with proximal migration of the femoral shaft. Electronically Signed   By: Jeb Levering M.D.   On: 03/18/2018 01:40        Scheduled Meds: . feeding supplement  237 mL Oral TID BM  . fentaNYL      . [MAR Hold] folic acid  1 mg Oral Daily  . HYDROmorphone      . [MAR Hold] LORazepam  0-4 mg Intravenous Q6H   Followed by  . [MAR Hold] LORazepam  0-4 mg Intravenous Q12H  . [MAR Hold] multivitamin with minerals  1 tablet Oral Daily  . [MAR Hold] nicotine  14 mg Transdermal Daily  . [MAR Hold] thiamine  100 mg Oral Daily   Or  . [MAR Hold] thiamine  100 mg Intravenous Daily   Continuous Infusions: . sodium chloride 110 mL/hr at 03/18/18 0555  . lactated ringers    . [MAR Hold] methocarbamol (ROBAXIN) IV  LOS: 0 days    Time spent: Mc[place]osh, MD [facility]  If 7PM-7AM, please contact night-coverage [email or web address] Password [email or web address] 03/18/2018, 1:26 PM

## 2018-03-18 NOTE — Progress Notes (Signed)
Patient returns from pacu to room 5n30 at this time.  No requests or concerns voiced.

## 2018-03-18 NOTE — H&P (View-Only) (Signed)
[facility]opaedic [facility]uma [facility] (OTS) Consult   Patient ID: Veronica Stewart MRN: [id number] DOB/AGE: 61-Aug-1957 61 y.o.  Reason for Consult:Left hip fracture Referring Physician: Dr. Christy Gentles, MD [facility]  HPI: Veronica Stewart is an 62 y.o. female is being seen in consultation at the request of Dr. Christy Gentles for evaluation of left hip fracture.  Patient was in her usual state of health, and she tripped while walking home.  She landed on the left side as she had immediate pain and inability to bear weight.  She scooted her way home where she called 911 and she was brought to the emergency room.  X-rays showed a displaced comminuted intertrochanteric femur fracture.  The patient states that the pain is worse anytime she attempts to move it.  She denies any numbness and tingling in her leg.  The patient notes that she does drink approximately two, 40 ounce beers a day.  The patient smokes about 1/2 pack of cigarettes a day.  She has a history of the alcohol use as noted above.  Patient does note if she does not drink one day she does get a little tremulous.  She does not use an assistive device and she has a one-story home that has 2 steps to access it.  The patient does not have any insurance.  Past Medical History:  Diagnosis Date  . Asthma   . Hypertension     History reviewed. No pertinent surgical history.  Family History  Problem Relation Age of Onset  . Sudden Cardiac Death Neg Hx     Social History:  reports that she has been smoking cigarettes.  She has been smoking about 0.50 packs per day. She has never used smokeless tobacco. She reports that she drinks alcohol. She reports that she has current or past drug history. Drug: Marijuana.  Allergies: No Known Allergies  Medications:  No current facility-administered medications on file prior to encounter.    Current Outpatient Medications on File Prior to Encounter  Medication Sig Dispense Refill  . aspirin 325 MG tablet Take 325  mg by mouth every 6 (six) hours as needed for mild pain.     ROS: Constitutional: No fever or chills Vision: No changes in vision ENT: No difficulty swallowing CV: No chest pain Pulm: No SOB or wheezing GI: No nausea or vomiting GU: No urgency or inability to hold urine Skin: No poor wound healing Neurologic: No numbness or tingling Psychiatric: No depression or anxiety Heme: No bruising Allergic: No reaction to medications or food   Exam: Blood pressure (!) 144/105, pulse 83, temperature 98.2 F (36.8 C), temperature source Oral, resp. rate 14, height 5\' 10"  (1.778 m), weight 61.2 kg (135 lb), SpO2 100 %. General: No acute distress Orientation: Awake alert and oriented Mood and Affect: Cooperative and pleasant Gait: Unable to assess due to her fracture Coordination and balance: Within normal limits  Left lower extremity: Reveals a shortened external rotated lower extremity compared to the right side.  There is no obvious deformity.  There is no skin lesions about the hip.  She is thin.  Compartments are soft and compressible.  She has active dorsiflexion plantarflexion as well as great toe extension.  She has sensation intact to light touch in all nerve distributions with a warm well-perfused foot.  No obvious deformity or notable instability about the ankle or knee.  Right lower extremity: Skin without lesions. No tenderness to palpation. Full painless ROM, full strength in each muscle groups without evidence of  instability.   Medical Decision Making: Imaging: X-rays of the pelvis and hip are reviewed which shows a comminuted displaced intertrochanteric femur fracture.  Labs:  Results for orders placed or performed during the hospital encounter of 03/18/18 (from the past 24 hour(s))  Basic metabolic panel     Status: Abnormal   Collection Time: 03/18/18  2:13 AM  Result Value Ref Range   Sodium 130 (L) 135 - 145 mmol/L   Potassium 4.0 3.5 - 5.1 mmol/L   Chloride 94 (L) 98 -  111 mmol/L   CO2 24 22 - 32 mmol/L   Glucose, Bld 111 (H) 70 - 99 mg/dL   BUN 5 (L) 8 - 23 mg/dL   Creatinine, Ser 0.71 0.44 - 1.00 mg/dL   Calcium 8.6 (L) 8.9 - 10.3 mg/dL   GFR calc non Af Amer >60 >60 mL/min   GFR calc Af Amer >60 >60 mL/min   Anion gap 12 5 - 15  CBC WITH DIFFERENTIAL     Status: None   Collection Time: 03/18/18  2:13 AM  Result Value Ref Range   WBC 8.9 4.0 - 10.5 K/uL   RBC 5.00 3.87 - 5.11 MIL/uL   Hemoglobin 14.7 12.0 - 15.0 g/dL   HCT 44.2 36.0 - 46.0 %   MCV 88.4 78.0 - 100.0 fL   MCH 29.4 26.0 - 34.0 pg   MCHC 33.3 30.0 - 36.0 g/dL   RDW 14.9 11.5 - 15.5 %   Platelets 189 150 - 400 K/uL   Neutrophils Relative % 86 %   Neutro Abs 7.6 1.7 - 7.7 K/uL   Lymphocytes Relative 10 %   Lymphs Abs 0.9 0.7 - 4.0 K/uL   Monocytes Relative 4 %   Monocytes Absolute 0.4 0.1 - 1.0 K/uL   Eosinophils Relative 0 %   Eosinophils Absolute 0.0 0.0 - 0.7 K/uL   Basophils Relative 0 %   Basophils Absolute 0.0 0.0 - 0.1 K/uL   Immature Granulocytes 0 %   Abs Immature Granulocytes 0.0 0.0 - 0.1 K/uL  Protime-INR     Status: None   Collection Time: 03/18/18  2:13 AM  Result Value Ref Range   Prothrombin Time 13.8 11.4 - 15.2 seconds   INR 1.07   Type and screen Katie     Status: None   Collection Time: 03/18/18  2:13 AM  Result Value Ref Range   ABO/RH(D) O POS    Antibody Screen NEG    Sample Expiration      03/21/2018 Performed at [facility], [address] [place], [place] [address]   Ethanol     Status: Abnormal   Collection Time: 03/18/18  2:13 AM  Result Value Ref Range   Alcohol, Ethyl (B) 190 (H) <10 mg/dL  ABO/Rh     Status: None (Preliminary result)   Collection Time: 03/18/18  2:13 AM  Result Value Ref Range   ABO/RH(D)      O POS Performed at [facility] [address], [place], [place] [address]    Medical history and chart was reviewed  Assessment/Plan: 62 year old female with a history of  hypertension, asthma and alcohol use with a displaced left intertrochanteric femur fracture.  Due to her age and ambulatory capabilities I feel that cephalo-medullary fixation of her left hip is most appropriate.  It does not appear that there is any reason that she cannot proceed with surgery this morning.  I discussed risks and benefits  with the patient. Risks discussed included bleeding requiring blood transfusion, bleeding causing a hematoma, infection, malunion, nonunion, damage to surrounding nerves and blood vessels, pain, hardware prominence or irritation, hardware failure, stiffness, post-traumatic arthritis, DVT/PE, compartment syndrome, and even death.  The patient agrees and we will proceed with surgery later this morning.  Shona Needles, MD [facility] [phone] (phone)

## 2018-03-18 NOTE — Consult Note (Signed)
Orthopaedic Trauma Service (OTS) Consult   Patient ID: Veronica Stewart MRN: [id number] DOB/AGE: 03/13/1956 62 y.o.  Reason for Consult:Left hip fracture Referring Physician: Dr. Christy Gentles, MD [facility]  HPI: Veronica Stewart is an 62 y.o. female is being seen in consultation at the request of Dr. Christy Gentles for evaluation of left hip fracture.  Patient was in her usual state of health, and she tripped while walking home.  She landed on the left side as she had immediate pain and inability to bear weight.  She scooted her way home where she called 911 and she was brought to the emergency room.  X-rays showed a displaced comminuted intertrochanteric femur fracture.  The patient states that the pain is worse anytime she attempts to move it.  She denies any numbness and tingling in her leg.  The patient notes that she does drink approximately two, 40 ounce beers a day.  The patient smokes about 1/2 pack of cigarettes a day.  She has a history of the alcohol use as noted above.  Patient does note if she does not drink one day she does get a little tremulous.  She does not use an assistive device and she has a one-story home that has 2 steps to access it.  The patient does not have any insurance.  Past Medical History:  Diagnosis Date  . Asthma   . Hypertension     History reviewed. No pertinent surgical history.  Family History  Problem Relation Age of Onset  . Sudden Cardiac Death Neg Hx     Social History:  reports that she has been smoking cigarettes.  She has been smoking about 0.50 packs per day. She has never used smokeless tobacco. She reports that she drinks alcohol. She reports that she has current or past drug history. Drug: Marijuana.  Allergies: No Known Allergies  Medications:  No current facility-administered medications on file prior to encounter.    Current Outpatient Medications on File Prior to Encounter  Medication Sig Dispense Refill  . aspirin 325 MG tablet Take 325  mg by mouth every 6 (six) hours as needed for mild pain.     ROS: Constitutional: No fever or chills Vision: No changes in vision ENT: No difficulty swallowing CV: No chest pain Pulm: No SOB or wheezing GI: No nausea or vomiting GU: No urgency or inability to hold urine Skin: No poor wound healing Neurologic: No numbness or tingling Psychiatric: No depression or anxiety Heme: No bruising Allergic: No reaction to medications or food   Exam: Blood pressure (!) 144/105, pulse 83, temperature 98.2 F (36.8 C), temperature source Oral, resp. rate 14, height 5\' 10"  (1.778 m), weight 61.2 kg (135 lb), SpO2 100 %. General: No acute distress Orientation: Awake alert and oriented Mood and Affect: Cooperative and pleasant Gait: Unable to assess due to her fracture Coordination and balance: Within normal limits  Left lower extremity: Reveals a shortened external rotated lower extremity compared to the right side.  There is no obvious deformity.  There is no skin lesions about the hip.  She is thin.  Compartments are soft and compressible.  She has active dorsiflexion plantarflexion as well as great toe extension.  She has sensation intact to light touch in all nerve distributions with a warm well-perfused foot.  No obvious deformity or notable instability about the ankle or knee.  Right lower extremity: Skin without lesions. No tenderness to palpation. Full painless ROM, full strength in each muscle groups without evidence of  instability.   Medical Decision Making: Imaging: X-rays of the pelvis and hip are reviewed which shows a comminuted displaced intertrochanteric femur fracture.  Labs:  Results for orders placed or performed during the hospital encounter of 03/18/18 (from the past 24 hour(s))  Basic metabolic panel     Status: Abnormal   Collection Time: 03/18/18  2:13 AM  Result Value Ref Range   Sodium 130 (L) 135 - 145 mmol/L   Potassium 4.0 3.5 - 5.1 mmol/L   Chloride 94 (L) 98 -  111 mmol/L   CO2 24 22 - 32 mmol/L   Glucose, Bld 111 (H) 70 - 99 mg/dL   BUN 5 (L) 8 - 23 mg/dL   Creatinine, Ser 0.71 0.44 - 1.00 mg/dL   Calcium 8.6 (L) 8.9 - 10.3 mg/dL   GFR calc non Af Amer >60 >60 mL/min   GFR calc Af Amer >60 >60 mL/min   Anion gap 12 5 - 15  CBC WITH DIFFERENTIAL     Status: None   Collection Time: 03/18/18  2:13 AM  Result Value Ref Range   WBC 8.9 4.0 - 10.5 K/uL   RBC 5.00 3.87 - 5.11 MIL/uL   Hemoglobin 14.7 12.0 - 15.0 g/dL   HCT 44.2 36.0 - 46.0 %   MCV 88.4 78.0 - 100.0 fL   MCH 29.4 26.0 - 34.0 pg   MCHC 33.3 30.0 - 36.0 g/dL   RDW 14.9 11.5 - 15.5 %   Platelets 189 150 - 400 K/uL   Neutrophils Relative % 86 %   Neutro Abs 7.6 1.7 - 7.7 K/uL   Lymphocytes Relative 10 %   Lymphs Abs 0.9 0.7 - 4.0 K/uL   Monocytes Relative 4 %   Monocytes Absolute 0.4 0.1 - 1.0 K/uL   Eosinophils Relative 0 %   Eosinophils Absolute 0.0 0.0 - 0.7 K/uL   Basophils Relative 0 %   Basophils Absolute 0.0 0.0 - 0.1 K/uL   Immature Granulocytes 0 %   Abs Immature Granulocytes 0.0 0.0 - 0.1 K/uL  Protime-INR     Status: None   Collection Time: 03/18/18  2:13 AM  Result Value Ref Range   Prothrombin Time 13.8 11.4 - 15.2 seconds   INR 1.07   Type and screen [place]     Status: None   Collection Time: 03/18/18  2:13 AM  Result Value Ref Range   ABO/RH(D) O POS    Antibody Screen NEG    Sample Expiration      03/21/2018 Performed at [facility], [address], [place], [place] [address]   Ethanol     Status: Abnormal   Collection Time: 03/18/18  2:13 AM  Result Value Ref Range   Alcohol, Ethyl (B) 190 (H) <10 mg/dL  ABO/Rh     Status: None (Preliminary result)   Collection Time: 03/18/18  2:13 AM  Result Value Ref Range   ABO/RH(D)      O POS Performed at [facility] [address], [place], [place] [address]    Medical history and chart was reviewed  Assessment/Plan: 62 year old female with a history of  hypertension, asthma and alcohol use with a displaced left intertrochanteric femur fracture.  Due to her age and ambulatory capabilities I feel that cephalo-medullary fixation of her left hip is most appropriate.  It does not appear that there is any reason that she cannot proceed with surgery this morning.  I discussed risks and benefits  with the patient. Risks discussed included bleeding requiring blood transfusion, bleeding causing a hematoma, infection, malunion, nonunion, damage to surrounding nerves and blood vessels, pain, hardware prominence or irritation, hardware failure, stiffness, post-traumatic arthritis, DVT/PE, compartment syndrome, and even death.  The patient agrees and we will proceed with surgery later this morning.  Shona Needles, MD [facility] [phone] (phone)

## 2018-03-18 NOTE — Interval H&P Note (Signed)
History and Physical Interval Note:  03/18/2018 10:46 AM  Veronica Stewart  has presented today for surgery, with the diagnosis of Left intertrochanteric hip fracture  The various methods of treatment have been discussed with the patient and family. After consideration of risks, benefits and other options for treatment, the patient has consented to  Procedure(s): INTRAMEDULLARY (IM) NAIL INTERTROCHANTRIC (Left) as a surgical intervention .  The patient's history has been reviewed, patient examined, no change in status, stable for surgery.  I have reviewed the patient's chart and labs.  Questions were answered to the patient's satisfaction.     Lennette Bihari P Haddix

## 2018-03-18 NOTE — Anesthesia Preprocedure Evaluation (Signed)
Anesthesia Evaluation  Patient identified by MRN, date of birth, ID band Patient awake    Reviewed: Allergy & Precautions, NPO status , Patient's Chart, lab work & pertinent test results  Airway Mallampati: II  TM Distance: >3 FB     Dental  (+) Poor Dentition, Dental Advisory Given   Pulmonary asthma , Current Smoker,    breath sounds clear to auscultation       Cardiovascular hypertension,  Rhythm:Regular Rate:Normal     Neuro/Psych    GI/Hepatic negative GI ROS, History noted   Endo/Other  negative endocrine ROS  Renal/GU negative Renal ROS     Musculoskeletal   Abdominal   Peds  Hematology   Anesthesia Other Findings   Reproductive/Obstetrics                             Anesthesia Physical Anesthesia Plan  ASA: III  Anesthesia Plan: General   Post-op Pain Management:    Induction: Intravenous  PONV Risk Score and Plan: 2 and Treatment may vary due to age or medical condition, Ondansetron, Dexamethasone and Midazolam  Airway Management Planned: Oral ETT  Additional Equipment:   Intra-op Plan:   Post-operative Plan: Possible Post-op intubation/ventilation  Informed Consent: I have reviewed the patients History and Physical, chart, labs and discussed the procedure including the risks, benefits and alternatives for the proposed anesthesia with the patient or authorized representative who has indicated his/her understanding and acceptance.   Dental advisory given  Plan Discussed with: Anesthesiologist and CRNA  Anesthesia Plan Comments:         Anesthesia Quick Evaluation

## 2018-03-18 NOTE — ED Provider Notes (Signed)
[facility] Provider Note   CSN: [id number] Arrival date & time: 03/18/18  0044     History   Chief Complaint Chief Complaint  Patient presents with  . Fall    HPI Veronica Stewart is a 62 y.o. female.  The history is provided by the patient.  Fall  This is a new problem. Episode onset: Just prior to arrival. The problem occurs constantly. The problem has been gradually worsening. Pertinent negatives include no chest pain, no abdominal pain and no headaches. Exacerbated by: Movement. The symptoms are relieved by rest.  PT with history of hypertension and alcohol abuse presents with fall and left hip pain. She reports she lost her balance and fell on the sidewalk landing on her left hip.  Denies head injury, denies LOC.  Denies any headache or neck pain.  No chest or back pain.  She admits to drinking alcohol, 40 ounce beers tonight Other than left hip pain, she has no other acute complaints  Past Medical History:  Diagnosis Date  . Asthma   . Hypertension     Patient Active Problem List   Diagnosis Date Noted  . Closed left hip fracture, initial encounter ([place]) 03/18/2018  . Hypertension 03/18/2018  . Alcohol dependence with acute alcoholic intoxication ([place]) 03/18/2018  . Hyponatremia 03/18/2018  . Asthma 03/18/2018    History reviewed. No pertinent surgical history.   OB History   None      Home Medications    Prior to Admission medications   Medication Sig Start Date End Date Taking? Authorizing Provider  aspirin 325 MG tablet Take 325 mg by mouth every 6 (six) hours as needed for mild pain.   Yes [provider]    Family History Family History  Problem Relation Age of Onset  . Sudden Cardiac Death Neg Hx     Social History Social History   Tobacco Use  . Smoking status: Current Every Day Smoker    Packs/day: 0.50    Types: Cigarettes  . Smokeless tobacco: Never Used  Substance Use Topics  .  Alcohol use: Yes    Comment: two 40 oz beers most days   . Drug use: Yes    Types: Marijuana     Allergies   Patient has no known allergies.   Review of Systems Review of Systems  Constitutional: Negative for fever.  Cardiovascular: Negative for chest pain.  Gastrointestinal: Negative for abdominal pain.  Musculoskeletal: Positive for arthralgias. Negative for back pain and neck pain.  Neurological: Negative for syncope and headaches.  All other systems reviewed and are negative.    Physical Exam Updated Vital Signs BP (!) 144/105 (BP Location: Right Arm)   Pulse 83   Temp 98.2 F (36.8 C) (Oral)   Resp 14   Ht 1.778 m (5\' 10" )   Wt 61.2 kg (135 lb)   SpO2 100%   BMI 19.37 kg/m   Physical Exam CONSTITUTIONAL: Disheveled, appears older than stated age HEAD: Normocephalic/atraumatic EYES: EOMI/PERRL ENMT: Mucous membranes moist, poor dentition NECK: supple no meningeal signs SPINE/BACK:entire spine nontender, no bruising/crepitance/stepoffs noted to spine CV: S1/S2 noted, no murmurs/rubs/gallops noted LUNGS: Lungs are clear to auscultation bilaterally, no apparent distress ABDOMEN: soft, nontender, no rebound or guarding, bowel sounds noted throughout abdomen GU:no cva tenderness NEURO: Pt is awake/alert/appropriate, moves all extremitiesx4.   EXTREMITIES: pulses normal/equal in feet, found by Doppler pulse in left foot, left lower extremity is shortened, and tenderness with range of  motion left hip. All other extremities/joints palpated/ranged and nontender SKIN: warm, color normal PSYCH: Anxious  ED Treatments / Results  Labs (all labs ordered are listed, but only abnormal results are displayed) Labs Reviewed  BASIC METABOLIC PANEL - Abnormal; Notable for the following components:      Result Value   Sodium 130 (*)    Chloride 94 (*)    Glucose, Bld 111 (*)    BUN 5 (*)    Calcium 8.6 (*)    All other components within normal limits  ETHANOL - Abnormal;  Notable for the following components:   Alcohol, Ethyl (B) 190 (*)    All other components within normal limits  MRSA PCR SCREENING  CBC WITH DIFFERENTIAL/PLATELET  PROTIME-INR  HIV ANTIBODY (ROUTINE TESTING)  TYPE AND SCREEN  ABO/RH    EKG EKG Interpretation  Date/Time:  Saturday March 18 2018 03:26:50 EDT Ventricular Rate:  90 PR Interval:    QRS Duration: 96 QT Interval:  372 QTC Calculation: 456 R Axis:   16 Text Interpretation:  Sinus rhythm Ventricular premature complex Probable inferior infarct, old Anterior infarct, old Lateral leads are also involved Confirmed by Ripley Fraise [id number]) on 03/18/2018 5:01:45 AM   Radiology Dg Chest 1 View  Result Date: 03/18/2018 CLINICAL DATA:  Hip pain. EXAM: CHEST  1 VIEW COMPARISON:  Chest radiograph Jan 04, 2004 FINDINGS: Cardiomediastinal silhouette is normal. Calcified aortic arch. No pleural effusions or focal consolidations. Hyperinflation. Large bulla LEFT upper lung zone. Trachea projects midline and there is no pneumothorax. Soft tissue planes and included osseous structures are non-suspicious. Osteopenia. IMPRESSION: Hyperinflation without focal consolidation. Aortic Atherosclerosis (ICD10-I70.0). Electronically Signed   By: Elon Alas M.D.   On: 03/18/2018 01:52   Dg Hip Unilat With Pelvis 2-3 Views Left  Result Date: 03/18/2018 CLINICAL DATA:  Left hip pain and deformity after fall. EXAM: DG HIP (WITH OR WITHOUT PELVIS) 2-3V LEFT COMPARISON:  None. FINDINGS: Comminuted and displaced intertrochanteric left proximal femur fracture with proximal migration of the femoral shaft. Displacement involves the both lesser and greater trochanters. The femoral head remains seated. Pubic rami are intact. No additional pelvic fracture. Pelvic calcifications likely fibroids. There are also vascular calcifications. IMPRESSION: Comminuted displaced intertrochanteric left proximal femur fracture with proximal migration of the femoral shaft.  Electronically Signed   By: Jeb Levering M.D.   On: 03/18/2018 01:40    Procedures Procedures   Medications Ordered in ED Medications  albuterol (PROVENTIL) (2.5 MG/3ML) 0.083% nebulizer solution 2.5 mg (has no administration in time range)  morphine 4 MG/ML injection 1-2 mg (2 mg Intravenous Given 03/18/18 0631)  0.9 %  sodium chloride infusion ( Intravenous New Bag/Given 03/18/18 0555)  senna-docusate (Senokot-S) tablet 1 tablet (has no administration in time range)  methocarbamol (ROBAXIN) tablet 500 mg (500 mg Oral Given 03/18/18 0631)    Or  methocarbamol (ROBAXIN) 500 mg in dextrose 5 % 50 mL IVPB ( Intravenous See Alternative 03/18/18 0631)  nicotine (NICODERM CQ - dosed in mg/24 hours) patch 14 mg (has no administration in time range)  hydrALAZINE (APRESOLINE) injection 5 mg (has no administration in time range)  LORazepam (ATIVAN) tablet 1 mg (has no administration in time range)    Or  LORazepam (ATIVAN) injection 1 mg (has no administration in time range)  thiamine (VITAMIN B-1) tablet 100 mg (has no administration in time range)    Or  thiamine (B-1) injection 100 mg (has no administration in time range)  folic acid (FOLVITE) tablet  1 mg (has no administration in time range)  multivitamin with minerals tablet 1 tablet (has no administration in time range)  LORazepam (ATIVAN) injection 0-4 mg (0 mg Intravenous Not Given 03/18/18 2620)    Followed by  LORazepam (ATIVAN) injection 0-4 mg (has no administration in time range)  ondansetron (ZOFRAN) injection 4 mg (4 mg Intravenous Given 03/18/18 0108)     Initial Impression / Assessment and Plan / ED Course  I have reviewed the triage vital signs and the nursing notes.  Pertinent labs & imaging results that were available during my care of the patient were reviewed by me and considered in my medical decision making (see chart for details).     Patient presented with mechanical fall resulting in a left hip fracture.  She admits  to alcohol abuse. Discussed the case with Dr. Doreatha Martin of orthopedics, he plans for operative management later in the day.  Discussed the case with Dr. Myna Hidalgo for admission to the medical service.  Patient has been updated on the plan.  Patient has been stable and appropriate in the emergency department  Final Clinical Impressions(s) / ED Diagnoses   Final diagnoses:  Closed fracture of left hip, initial encounter [facility])  Alcohol abuse    ED Discharge Orders    None       Ripley Fraise, MD 03/18/18 [phone]

## 2018-03-18 NOTE — Transfer of Care (Signed)
Immediate Anesthesia Transfer of Care Note  Patient: Veronica Stewart  Procedure(s) Performed: INTRAMEDULLARY (IM) NAIL INTERTROCHANTRIC (Left Hip)  Patient Location: PACU  Anesthesia Type:General  Level of Consciousness: awake, alert  and oriented  Airway & Oxygen Therapy: Patient Spontanous Breathing and Patient connected to nasal cannula oxygen  Post-op Assessment: Report given to RN and Post -op Vital signs reviewed and stable  Post vital signs: Reviewed and stable  Last Vitals:  Vitals Value Taken Time  BP 197/112 03/18/2018  1:04 PM  Temp    Pulse 91 03/18/2018  1:05 PM  Resp 14 03/18/2018  1:05 PM  SpO2 93 % 03/18/2018  1:05 PM  Vitals shown include unvalidated device data.  Last Pain:  Vitals:   03/18/18 1100  TempSrc:   PainSc: 2          Complications: No apparent anesthesia complications

## 2018-03-18 NOTE — Progress Notes (Signed)
Reviewed imaging and discussed with Dr. Christy Gentles. 62 yo female s/p fall with left intertrochanteric femur fracture. Tenatively plan for fixation later this AM. Formal consult to follow. Please keep NPO.  Shona Needles, MD [facility] [phone] (phone)

## 2018-03-18 NOTE — ED Notes (Signed)
Patient transported to X-ray 

## 2018-03-18 NOTE — Progress Notes (Addendum)
Initial Nutrition Assessment  DOCUMENTATION CODES:  Underweight, Severe malnutrition in context of chronic illness  INTERVENTION:  Once diet is advanced Ensure Surgery TID  MVI, thiamin, folate already have been ordered  NUTRITION DIAGNOSIS:  Severe Malnutrition related to chronic illness(etoh dependence/alcoholism) as evidenced by severe muscle/fat depletion  GOAL:  Patient will meet greater than or equal to 90% of their needs  MONITOR:  PO intake, Supplement acceptance, Diet advancement, Labs, Weight trends, I & O's  REASON FOR ASSESSMENT:  Consult Hip fracture protocol  ASSESSMENT:  62 y/o female w/ hx etoh abuse (2x 40oz/day) and HTN. Fell while intoxicated and experienced severe L hip pain. Presented to ED and found to have L hip fx. To OR today for fixation this AM.   Pt seen immediately prior to surgery. She is a poor historian. She reports prior to fall, she was at her baseline. She says she only eats 2x a day. She does not follow any type of diet nor does she take any supplements at baseline. She does not know what her UBW is, but says "I dont weigh much"   There is no history in chart from which to pull information. Bed weight today is 53.3 kg-much lower than her listed weight.  Physical exam: Displays severe fat/muscle wasting.   She is agreeable to supplementation following surgery. Will also recommend mvi given hx of etoh abuse.   Labs: NA 130-MD noted beer potomania  Meds: IV fentanyl, Folate, MVI with min, Thiamin, IV abx, IVF  Recent Labs  Lab 03/18/18 0213  NA 130*  K 4.0  CL 94*  CO2 24  BUN 5*  CREATININE 0.71  CALCIUM 8.6*  GLUCOSE 111*   NUTRITION - FOCUSED PHYSICAL EXAM:   Most Recent Value  Orbital Region  Moderate depletion  Upper Arm Region  Severe depletion  Thoracic and Lumbar Region  Severe depletion  Buccal Region  Severe depletion  Temple Region  Moderate depletion  Clavicle Bone Region  Severe depletion  Clavicle and Acromion  Bone Region  Severe depletion  Scapular Bone Region  Unable to assess  Dorsal Hand  Severe depletion  Patellar Region  Mild depletion  Anterior Thigh Region  Severe depletion  Posterior Calf Region  Moderate depletion  Edema (RD Assessment)  None     Diet Order:   Diet Order           Diet NPO time specified Except for: Ice Chips  Diet effective now          EDUCATION NEEDS:  No education needs have been identified at this time  Skin: Abrasion to elbow   Last BM:  8/3  Height:  Ht Readings from Last 1 Encounters:  03/18/18 5\' 10"  (1.778 m)   Weight:  Wt Readings from Last 1 Encounters:  03/18/18 117 lb 8.1 oz (53.3 kg)  No further weight history on file.   Ideal Body Weight:  68.18 kg  BMI:  Body mass index is 16.86 kg/m.  Estimated Nutritional Needs:  Kcal:  1850-2150 kcals (35-40 kcal/kg bw) Protein:  75-85g Pro (1.4-1.6g/kg bw) Fluid:  >1.6 L fluid (30 ml/kg bw)  Burtis Junes RD, LDN, CNSC Clinical Nutrition Available Tues-Sat via Pager: [phone] 03/18/2018 10:50 AM

## 2018-03-18 NOTE — H&P (Signed)
History and Physical    Veronica Stewart WRU:[id number] DOB: [birth year] DOA: 03/18/2018  PCP: Patient, No Pcp Per   Patient coming from: Home   Chief Complaint: Fall with left hip pain and deformity   HPI: Veronica Stewart is a 62 y.o. female with medical history significant for hypertension, asthma, and alcohol dependence, now presenting to the emergency department with severe left hip pain and deformity after mechanical fall.  Patient reports that she had been in her usual state of health, having an uneventful evening, drank two 40 oz beers as she does just about every day, and then tripped while walking home, landing on her left side and experiencing immediate and severe pain at the left hip.  She was unable to stand, but reports that she was almost home and "scooted" to her house where she called 911.  She denies hitting her head or losing consciousness.  She denies any recent fevers or chills.  She reports that she is typically able to walk long distance, including up a flight of stairs, without any chest pain or shortness of breath.  She reports a history of asthma, but only uses her inhaler a couple times a year.  She does not take any medications regularly.  ED Course: Upon arrival to the ED, patient is found to be afebrile, saturating well on room air, slightly hypertensive, and vitals otherwise stable.  EKG features a sinus rhythm with PVC.  Chest x-ray is notable for hyperinflation without focal consolidation.  Radiographs of the left hip demonstrate comminuted displaced intertrochanteric left proximal femur fracture with proximal migration of the femur shaft.  Chemistry panel is notable for a sodium of 130.  Ethanol level is elevated 290.  CBC is unremarkable and INR is normal.  Type and screen was performed, fentanyl and Zofran were administered, and orthopedic surgery was consulted by the ED physician.  Surgeon recommended medical admission and asked that the patient be kept n.p.o., planning  tentatively for surgery this morning.  Review of Systems:  All other systems reviewed and apart from HPI, are negative.  Past Medical History:  Diagnosis Date  . Asthma   . Hypertension     History reviewed. No pertinent surgical history.   reports that she has been smoking cigarettes.  She has been smoking about 0.50 packs per day. She has never used smokeless tobacco. She reports that she drinks alcohol. She reports that she has current or past drug history. Drug: Marijuana.  No Known Allergies  Family History  Problem Relation Age of Onset  . Sudden Cardiac Death Neg Hx      Prior to Admission medications   Not on File    Physical Exam: Vitals:   03/18/18 0345 03/18/18 0400 03/18/18 0430 03/18/18 0445  BP: (!) 153/99 (!) 153/105 (!) 147/104 (!) 131/93  Pulse: 96 87 86 87  Resp: (!) 21 13 14 16   Temp:      TempSrc:      SpO2: 97% 98% 98% 95%  Weight:      Height:          Constitutional: NAD, calm  Eyes: PERTLA, lids and conjunctivae normal ENMT: Mucous membranes are moist. Posterior pharynx clear of any exudate or lesions.   Neck: normal, supple, no masses, no thyromegaly Respiratory: clear to auscultation bilaterally, no wheezing, no crackles. Normal respiratory effort.  Cardiovascular: S1 & S2 heard, regular rate and rhythm. No extremity edema.   Abdomen: No distension, no tenderness, soft. Bowel sounds normal.  Musculoskeletal:  no clubbing / cyanosis. Left leg shortened with gross deformity and tenderness at the hip; neurovascularly intact distally.  Skin: no significant rashes, lesions, ulcers. Poor turgor. Neurologic: CN 2-12 grossly intact. Sensation intact. Strength 5/5 in all 4 limbs.  Psychiatric: Alert and oriented to person, place, and situation. Pleasant, cooperative.     Labs on Admission: I have personally reviewed following labs and imaging studies  CBC: Recent Labs  Lab 03/18/18 0213  WBC 8.9  NEUTROABS 7.6  HGB 14.7  HCT 44.2  MCV  88.4  PLT 416   Basic Metabolic Panel: Recent Labs  Lab 03/18/18 0213  NA 130*  K 4.0  CL 94*  CO2 24  GLUCOSE 111*  BUN 5*  CREATININE 0.71  CALCIUM 8.6*   GFR: Estimated Creatinine Clearance: 71.3 mL/min (by C-G formula based on SCr of 0.71 mg/dL). Liver Function Tests: No results for input(s): AST, ALT, ALKPHOS, BILITOT, PROT, ALBUMIN in the last 168 hours. No results for input(s): LIPASE, AMYLASE in the last 168 hours. No results for input(s): AMMONIA in the last 168 hours. Coagulation Profile: Recent Labs  Lab 03/18/18 0213  INR 1.07   Cardiac Enzymes: No results for input(s): CKTOTAL, CKMB, CKMBINDEX, TROPONINI in the last 168 hours. BNP (last 3 results) No results for input(s): PROBNP in the last 8760 hours. HbA1C: No results for input(s): HGBA1C in the last 72 hours. CBG: No results for input(s): GLUCAP in the last 168 hours. Lipid Profile: No results for input(s): CHOL, HDL, LDLCALC, TRIG, CHOLHDL, LDLDIRECT in the last 72 hours. Thyroid Function Tests: No results for input(s): TSH, T4TOTAL, FREET4, T3FREE, THYROIDAB in the last 72 hours. Anemia Panel: No results for input(s): VITAMINB12, FOLATE, FERRITIN, TIBC, IRON, RETICCTPCT in the last 72 hours. Urine analysis: No results found for: COLORURINE, APPEARANCEUR, LABSPEC, PHURINE, GLUCOSEU, HGBUR, BILIRUBINUR, KETONESUR, PROTEINUR, UROBILINOGEN, NITRITE, LEUKOCYTESUR Sepsis Labs: @LABRCNTIP (procalcitonin:4,lacticidven:4) )No results found for this or any previous visit (from the past 240 hour(s)).   Radiological Exams on Admission: Dg Chest 1 View  Result Date: 03/18/2018 CLINICAL DATA:  Hip pain. EXAM: CHEST  1 VIEW COMPARISON:  Chest radiograph Jan 04, 2004 FINDINGS: Cardiomediastinal silhouette is normal. Calcified aortic arch. No pleural effusions or focal consolidations. Hyperinflation. Large bulla LEFT upper lung zone. Trachea projects midline and there is no pneumothorax. Soft tissue planes and  included osseous structures are non-suspicious. Osteopenia. IMPRESSION: Hyperinflation without focal consolidation. Aortic Atherosclerosis (ICD10-I70.0). Electronically Signed   By: Elon Alas M.D.   On: 03/18/2018 01:52   Dg Hip Unilat With Pelvis 2-3 Views Left  Result Date: 03/18/2018 CLINICAL DATA:  Left hip pain and deformity after fall. EXAM: DG HIP (WITH OR WITHOUT PELVIS) 2-3V LEFT COMPARISON:  None. FINDINGS: Comminuted and displaced intertrochanteric left proximal femur fracture with proximal migration of the femoral shaft. Displacement involves the both lesser and greater trochanters. The femoral head remains seated. Pubic rami are intact. No additional pelvic fracture. Pelvic calcifications likely fibroids. There are also vascular calcifications. IMPRESSION: Comminuted displaced intertrochanteric left proximal femur fracture with proximal migration of the femoral shaft. Electronically Signed   By: Jeb Levering M.D.   On: 03/18/2018 01:40    EKG: Independently reviewed. Sinus rhythm, PVC.   Assessment/Plan   1. Left hip fracture  - Presents with severe left hip pain and deformity after a mechanical fall; denies hitting head or LOC  - Radiographs confirm comminuted displaced left intertrochanteric proximal femur fracture  - She is neurovascularly intact  - Orthopedic surgery consulting and  much appreciated, planning tentatively for surgery this am  - Based on the available data, Veronica Stewart presents an estimated 0.2% risk for perioperative MI or cardiac arrest per Melburn Hake al  - Keep NPO, continue IVF hydration, continue pain-control, supportive care   2. Alcohol dependence  - Reports drinking two 40 oz beers almost every day - She is intoxicated on presentation  - Monitor with CIWA and prn Ativan, supplement b-vitamins   3. Hypertension  - Reports hx of HTN but not taking any medications  - BP slightly elevated in ED, pain could be contributing   - Continue analgesia  as needed, use hydralazine IVP's prn    4. Asthma  - Reports hx of asthma managed with prn albuterol MDI, but rarely needs  - Smoking hx and CXR suggests possible COPD  - She denies recent cough, wheeze, or SOB  - Continue albuterol prn, encourage smoking-cessation  5. Hyponatremia  - Serum sodium is 130 on admission  - She appears hypovolemic and there is likely an element of beer drinker's potomania  - Continue IVF hydration with NS    DVT prophylaxis: SCD's  Code Status: Full  Family Communication: Discussed with patient  Consults called: Orthopedic surgery  Admission status: Inpatient     Vianne Bulls, MD [facility] Pager [phone]  If 7PM-7AM, please contact night-coverage [email or web address] Password [email or web address]  03/18/2018, 5:04 AM

## 2018-03-19 LAB — COMPREHENSIVE METABOLIC PANEL
ALBUMIN: 3.1 g/dL — AB (ref 3.5–5.0)
ALK PHOS: 42 U/L (ref 38–126)
ALT: 11 U/L (ref 0–44)
AST: 23 U/L (ref 15–41)
Anion gap: 7 (ref 5–15)
BUN: 6 mg/dL — AB (ref 8–23)
CALCIUM: 8.5 mg/dL — AB (ref 8.9–10.3)
CO2: 28 mmol/L (ref 22–32)
CREATININE: 0.68 mg/dL (ref 0.44–1.00)
Chloride: 96 mmol/L — ABNORMAL LOW (ref 98–111)
GFR calc non Af Amer: >60 mL/min (ref >60)
GLUCOSE: 109 mg/dL — AB (ref 70–99)
Potassium: 5 mmol/L (ref 3.5–5.1)
SODIUM: 131 mmol/L — AB (ref 135–145)
Total Bilirubin: 0.8 mg/dL (ref 0.3–1.2)
Total Protein: 5.8 g/dL — ABNORMAL LOW (ref 6.5–8.1)

## 2018-03-19 LAB — CBC
HEMATOCRIT: 32.8 % — AB (ref 36.0–46.0)
HEMOGLOBIN: 10.6 g/dL — AB (ref 12.0–15.0)
MCH: 29.4 pg (ref 26.0–34.0)
MCHC: 32.3 g/dL (ref 30.0–36.0)
MCV: 90.9 fL (ref 78.0–100.0)
Platelets: 132 10*3/uL — ABNORMAL LOW (ref 150–400)
RBC: 3.61 MIL/uL — AB (ref 3.87–5.11)
RDW: 14.9 % (ref 11.5–15.5)
WBC: 6.3 10*3/uL (ref 4.0–10.5)

## 2018-03-19 LAB — MAGNESIUM: Magnesium: 1.8 mg/dL (ref 1.7–2.4)

## 2018-03-19 MED ORDER — VITAMIN D 1000 UNITS PO TABS
2000.0000 [IU] | ORAL_TABLET | Freq: Every day | ORAL | Status: DC
  Administered 2018-03-19 – 2018-03-22 (×4): 2000 [IU] via ORAL
  Filled 2018-03-19 (×4): qty 2

## 2018-03-19 MED ORDER — AMLODIPINE BESYLATE 5 MG PO TABS
5.0000 mg | ORAL_TABLET | Freq: Every day | ORAL | Status: DC
  Administered 2018-03-19 – 2018-03-22 (×4): 5 mg via ORAL
  Filled 2018-03-19 (×3): qty 1

## 2018-03-19 MED ORDER — HYDROCODONE-ACETAMINOPHEN 5-325 MG PO TABS
1.0000 | ORAL_TABLET | ORAL | Status: DC | PRN
  Administered 2018-03-19 – 2018-03-22 (×13): 1 via ORAL
  Filled 2018-03-19 (×13): qty 1

## 2018-03-19 NOTE — Evaluation (Signed)
Occupational Therapy Evaluation Patient Details Name: Veronica Stewart MRN: [id number] DOB: 10-19-1955 Today's Date: 03/19/2018    History of Present Illness Pt is a 62 y.o. F with significant PMH of asthma, hypertension, alcohol use with displaced left intertrochanteric femur fracture s/p cephalomedullary nailing.   Clinical Impression   PTA, pt was living with her aunt and uncle and was independent with ADLs and IADLs. Pt currently requiring Mod A for LB ADLs and Min A for functional mobility with RW. Pt presenting with decreased awareness of weighting bearing status and demonstrated poor adherence to TDWB status; pt requiring Max VCs for weight bearing. PT motivated to return home and PLOF. Pt will require further acute OT to facilitate safe dc. Recommend dc to CIR for intensive OT to optimize safety, independence with ADLs, and return to PLOF.      Follow Up Recommendations  CIR    Equipment Recommendations  3 in 1 bedside commode    Recommendations for Other Services PT consult     Precautions / Restrictions Precautions Precautions: Fall Restrictions Weight Bearing Restrictions: Yes LLE Weight Bearing: Touchdown weight bearing      Mobility Bed Mobility Overal bed mobility: Needs Assistance Bed Mobility: Sit to Supine     Supine to sit: Min assist Sit to supine: Min assist   General bed mobility comments: Min A for managing LLE over EOB  Transfers Overall transfer level: Needs assistance Equipment used: Rolling walker (2 wheeled) Transfers: Sit to/from Stand Sit to Stand: Min assist         General transfer comment: Min A for safety. Pt with poor compliance to WBing precautions despite increased cues and demonstration of appropiate pressure.     Balance Overall balance assessment: Needs assistance Sitting-balance support: No upper extremity supported;Feet supported Sitting balance-Leahy Scale: Good     Standing balance support: Bilateral upper extremity  supported Standing balance-Leahy Scale: Fair                             ADL either performed or assessed with clinical judgement   ADL Overall ADL's : Needs assistance/impaired Eating/Feeding: Set up;Sitting   Grooming: Set up;Sitting   Upper Body Bathing: Set up;Sitting   Lower Body Bathing: Moderate assistance;Sit to/from stand   Upper Body Dressing : Set up;Sitting   Lower Body Dressing: Moderate assistance;Sit to/from stand Lower Body Dressing Details (indicate cue type and reason): Pt able to adjust right sock by bringing legs upward towards torso. Unable to bend forward for LLE.  Toilet Transfer: Minimal assistance;Ambulation;BSC;RW Toilet Transfer Details (indicate cue type and reason): Min A for safety Toileting- Clothing Manipulation and Hygiene: Minimal assistance;Sitting/lateral lean;Sit to/from stand       Functional mobility during ADLs: Minimal assistance;Rolling walker General ADL Comments: Pt demonstrating pooor adherance to [place] status with decreased awareness. Pt stating "yeah, im doing that" when reeducated about TDWBing. Pt motivated to return to PLOF.     Vision         Perception     Praxis      Pertinent Vitals/Pain Pain Assessment: Faces Faces Pain Scale: Hurts even more Pain Location: left hip Pain Descriptors / Indicators: Grimacing;Operative site guarding;Moaning Pain Intervention(s): Monitored during session;Limited activity within patient's tolerance;Repositioned     Hand Dominance Right   Extremity/Trunk Assessment Upper Extremity Assessment Upper Extremity Assessment: Overall WFL for tasks assessed   Lower Extremity Assessment Lower Extremity Assessment: Defer to PT evaluation RLE Deficits / Details: s/p  cephalomedullary nailing. MMT: knee extension 2/5, ankle dorsiflexion/plantarflexion 2/5. RLE resting in internal rotation.    Cervical / Trunk Assessment Cervical / Trunk Assessment: Normal   Communication  Communication Communication: No difficulties   Cognition Arousal/Alertness: Awake/alert Behavior During Therapy: WFL for tasks assessed/performed Overall Cognitive Status: Impaired/Different from baseline Area of Impairment: Safety/judgement;Awareness                         Safety/Judgement: Decreased awareness of safety(Decreased awareness of weightbearing precautions) Awareness: Emergent   General Comments: Feel pt is close to her baseline cognition. Some decreased awareness of precautions noted. Requiring increased cues for adherance to [place] status.   General Comments      Exercises Exercises: General Lower Extremity General Exercises - Lower Extremity Ankle Circles/Pumps: 10 reps;Both;Seated    Shoulder Instructions      Home Living Family/patient expects to be discharged to:: Private residence Living Arrangements: Other (Comment);Other relatives(aunt) Available Help at Discharge: Family;Available PRN/intermittently Type of Home: House Home Access: Level entry(back entrance)     Home Layout: One level     Bathroom Shower/Tub: Tub/shower unit;Curtain   Bathroom Toilet: Standard     Home Equipment: None          Prior Functioning/Environment Level of Independence: Independent        Comments: Reports 2 falls in last month        OT Problem List: Decreased range of motion;Decreased activity tolerance;Impaired balance (sitting and/or standing);Decreased safety awareness;Decreased knowledge of use of DME or AE;Decreased knowledge of precautions;Pain      OT Treatment/Interventions: Self-care/ADL training;Therapeutic exercise;Energy conservation;DME and/or AE instruction;Therapeutic activities;Patient/family education    OT Goals(Current goals can be found in the care plan section) Acute Rehab OT Goals Patient Stated Goal: go to the beach in September OT Goal Formulation: With patient Time For Goal Achievement: 04/02/18 Potential to Achieve  Goals: Good ADL Goals Pt Will Perform Lower Body Dressing: with set-up;with supervision;sit to/from stand(with or without AE) Pt Will Transfer to Toilet: with set-up;with supervision;ambulating;bedside commode(Adhering to TDWBing) Pt Will Perform Toileting - Clothing Manipulation and hygiene: with set-up;with supervision;sitting/lateral leans;sit to/from stand Pt Will Perform Tub/Shower Transfer: Tub transfer;3 in 1;rolling walker;ambulating;with min guard assist  OT Frequency: Min 3X/week   Barriers to D/C:            Co-evaluation              AM-PAC PT "6 Clicks" Daily Activity     Outcome Measure Help from another person eating meals?: None Help from another person taking care of personal grooming?: None Help from another person toileting, which includes using toliet, bedpan, or urinal?: A Little Help from another person bathing (including washing, rinsing, drying)?: A Lot Help from another person to put on and taking off regular upper body clothing?: A Little Help from another person to put on and taking off regular lower body clothing?: A Lot 6 Click Score: 18   End of Session Equipment Utilized During Treatment: Gait belt;Rolling walker Nurse Communication: Mobility status;Precautions;Weight bearing status(NT- Pt requesting a bath)  Activity Tolerance: Patient tolerated treatment well Patient left: in bed;with call bell/phone within reach  OT Visit Diagnosis: Unsteadiness on feet (R26.81);Other abnormalities of gait and mobility (R26.89);Muscle weakness (generalized) (M62.81);Pain;Other symptoms and signs involving cognitive function                Time: 9983-3825 OT Time Calculation (min): 22 min Charges:  OT General Charges $OT Visit: 1 Visit  OT Evaluation $OT Eval Moderate Complexity: 1 Mod  Drexel Ivey MSOT, OTR/L Acute Rehab Pager: [phone] Office: [place] 03/19/2018, 12:09 PM

## 2018-03-19 NOTE — Progress Notes (Signed)
Orthopaedic Trauma Progress Note  S: Doing well this morning.  No significant complaints.  Denies any chest pain or shortness of breath.  Has received IV pain medication but otherwise has not felt   Bad otherwise.  O:  Vitals:   03/19/18 0015 03/19/18 0614  BP: 138/90 (!) 154/104  Pulse: 63 77  Resp: 16 16  Temp: 98.3 F (36.8 C) 98.6 F (37 C)  SpO2: 99% 100%   No acute distress, awake alert and oriented x3. Left lower extremity: Dressing has some mild strikethrough with some saturation.  Is still in place.  Compartments soft and compressible.  Able to move foot up and down with a good strength and a EHL and tibialis anterior and gastroc soleus complex.  Endorses sensation of the dorsum and plantar aspect of her foot.  Imaging: Stable post op imaging  Labs:  Results for orders placed or performed during the hospital encounter of 03/18/18 (from the past 24 hour(s))  CBC     Status: Abnormal   Collection Time: 03/19/18  4:40 AM  Result Value Ref Range   WBC 6.3 4.0 - 10.5 K/uL   RBC 3.61 (L) 3.87 - 5.11 MIL/uL   Hemoglobin 10.6 (L) 12.0 - 15.0 g/dL   HCT 32.8 (L) 36.0 - 46.0 %   MCV 90.9 78.0 - 100.0 fL   MCH 29.4 26.0 - 34.0 pg   MCHC 32.3 30.0 - 36.0 g/dL   RDW 14.9 11.5 - 15.5 %   Platelets 132 (L) 150 - 400 K/uL  Magnesium     Status: None   Collection Time: 03/19/18  4:40 AM  Result Value Ref Range   Magnesium 1.8 1.7 - 2.4 mg/dL  Comprehensive metabolic panel     Status: Abnormal   Collection Time: 03/19/18  4:40 AM  Result Value Ref Range   Sodium 131 (L) 135 - 145 mmol/L   Potassium 5.0 3.5 - 5.1 mmol/L   Chloride 96 (L) 98 - 111 mmol/L   CO2 28 22 - 32 mmol/L   Glucose, Bld 109 (H) 70 - 99 mg/dL   BUN 6 (L) 8 - 23 mg/dL   Creatinine, Ser 0.68 0.44 - 1.00 mg/dL   Calcium 8.5 (L) 8.9 - 10.3 mg/dL   Total Protein 5.8 (L) 6.5 - 8.1 g/dL   Albumin 3.1 (L) 3.5 - 5.0 g/dL   AST 23 15 - 41 U/L   ALT 11 0 - 44 U/L   Alkaline Phosphatase 42 38 - 126 U/L   Total  Bilirubin 0.8 0.3 - 1.2 mg/dL   GFR calc non Af Amer >60 >60 mL/min   GFR calc Af Amer >60 >60 mL/min   Anion gap 7 5 - 15    Assessment: 62 year old female s/p fall  Injuries: L intertrochanteric femur fracture s/p fixation  Weightbearing: TDWB LLE  Insicional and dressing care: Dressings clean, dry and intact until POD2  Orthopedic device(s):None needed  CV/Blood loss:Hgb 10.6 this AM, acute blood loss anemia. Slightly hypertensive this AM, pulse stable  Pain management: 1. Morphine 1 mg q 2hr PRN 2. Robaxin 500 mg q 6hr PRN 3. Start Norco 5/325mg  q 4hr PRN  VTE prophylaxis: Lovenox in hospital and likely aspirin 325mg  BID upon discharge  ID: Ancef 2mg  postoperatively  Foley/Lines: Discontinue foley this AM  Medical co-morbidities: 1. Hypertension-no home meds, will monitor, will defer to hospitalist regarding starting single agent 2. Alcohol use-CIWA  Impediments to Fracture Healing: Malnutrition and alcohol use-recommend starting vitamin  D-2000 units daily  Dispo: PT/OT eval pending, TBD  Follow - up plan: Follow up in 2-3 weeks for x-rays   Shona Needles, MD [facility] [phone] (phone)

## 2018-03-19 NOTE — Progress Notes (Signed)
PROGRESS NOTE    Veronica Stewart  ZOX:[id number] DOB: 27-Dec-1955 DOA: 03/18/2018 PCP: Patient, No Pcp Per    Brief Narrative:  62 year old with past medical history relevant for asthma, alcohol abuse, untreated hypertension who comes in with a mechanical fall and subsequent development of left hip fracture status post nail on 03/18/2018   Assessment & Plan:   Principal Problem:   Closed left hip fracture, initial encounter [facility]) Active Problems:   Hypertension   Alcohol dependence with acute alcoholic intoxication ([place])   Hyponatremia   Asthma   Protein-calorie malnutrition, severe   #) Left hip fracture status post intramedullary nail on 03/18/2017: Patient is doing well -Orthopedic surgery following, appreciate recommendations -Pain control - Prophylaxis will defer to orthopedic surgery at this time - PT recommends code inpatient rehab, rolling 5 inch walker and 3 and 1, occupational therapy recommends 21 bedside commode  #) Alcohol abuse with acute alcohol intoxication: Patient reports that she does drink quite a bit but has never had withdrawals or seizures -CIWA protocol -Continue thiamine and folate  #) Hyponatremia: Improving with gentle IV hydration -Continue IV fluids  #) Asthma: -Continue PRN bronchodilators  #) Hypertension:  -Start amlodipine 5 mg daily  Fluids: Gentle IV fluids Elect lites: Monitor and supplement Nutrition: Heart healthy diet  Prophylaxis: Enoxaparin  Disposition: Pending evaluation by inpatient rehab   full code   Consultants:   Orthopedic surgery  Procedures:   03/18/2018 left intramedullary nail  Antimicrobials:   Perioperative antibiotics   Subjective: She reports that she is in significant pain but is improved after the surgery.  She denies any nausea, vomiting, diarrhea.  She denies any cough, congestion.  Objective: Vitals:   03/18/18 1443 03/18/18 1956 03/19/18 0015 03/19/18 0614  BP: (!) 137/92 (!) 140/104 138/90  (!) 154/104  Pulse: 75 70 63 77  Resp: 18 18 16 16   Temp: 98.7 F (37.1 C) 98 F (36.7 C) 98.3 F (36.8 C) 98.6 F (37 C)  TempSrc: Oral Oral Oral Oral  SpO2: 98% 100% 99% 100%  Weight:      Height:        Intake/Output Summary (Last 24 hours) at 03/19/2018 1215 Last data filed at 03/19/2018 0617 Gross per 24 hour  Intake 1520 ml  Output 2050 ml  Net -530 ml   Filed Weights   03/18/18 0051 03/18/18 1046  Weight: 61.2 kg (135 lb) 53.3 kg (117 lb 8.1 oz)    Examination:  General exam: No acute distress Respiratory system: Clear to auscultation. Respiratory effort normal. Cardiovascular system: Regular rate and rhythm, no murmurs Gastrointestinal system: Abdomen is nondistended, soft and nontender. No organomegaly or masses felt. Normal bowel sounds heard. Central nervous system: Alert and oriented. No focal neurological deficits. Extremities: Hesitant to move left leg, able to wiggle toes, intact distal pulses, trace lower extremity edema Skin: Incision site is clean dry and intact Psychiatry: Judgement and insight appear normal. Mood & affect appropriate.     Data Reviewed: I have personally reviewed following labs and imaging studies  CBC: Recent Labs  Lab 03/18/18 0213 03/19/18 0440  WBC 8.9 6.3  NEUTROABS 7.6  --   HGB 14.7 10.6*  HCT 44.2 32.8*  MCV 88.4 90.9  PLT 189 811*   Basic Metabolic Panel: Recent Labs  Lab 03/18/18 0213 03/19/18 0440  NA 130* 131*  K 4.0 5.0  CL 94* 96*  CO2 24 28  GLUCOSE 111* 109*  BUN 5* 6*  CREATININE 0.71 0.68  CALCIUM  8.6* 8.5*  MG  --  1.8   GFR: Estimated Creatinine Clearance: 62.1 mL/min (by C-G formula based on SCr of 0.68 mg/dL). Liver Function Tests: Recent Labs  Lab 03/19/18 0440  AST 23  ALT 11  ALKPHOS 42  BILITOT 0.8  PROT 5.8*  ALBUMIN 3.1*   No results for input(s): LIPASE, AMYLASE in the last 168 hours. No results for input(s): AMMONIA in the last 168 hours. Coagulation Profile: Recent Labs    Lab 03/18/18 0213  INR 1.07   Cardiac Enzymes: No results for input(s): CKTOTAL, CKMB, CKMBINDEX, TROPONINI in the last 168 hours. BNP (last 3 results) No results for input(s): PROBNP in the last 8760 hours. HbA1C: No results for input(s): HGBA1C in the last 72 hours. CBG: No results for input(s): GLUCAP in the last 168 hours. Lipid Profile: No results for input(s): CHOL, HDL, LDLCALC, TRIG, CHOLHDL, LDLDIRECT in the last 72 hours. Thyroid Function Tests: No results for input(s): TSH, T4TOTAL, FREET4, T3FREE, THYROIDAB in the last 72 hours. Anemia Panel: No results for input(s): VITAMINB12, FOLATE, FERRITIN, TIBC, IRON, RETICCTPCT in the last 72 hours. Sepsis Labs: No results for input(s): PROCALCITON, LATICACIDVEN in the last 168 hours.  Recent Results (from the past 240 hour(s))  MRSA PCR Screening     Status: None   Collection Time: 03/18/18  6:50 AM  Result Value Ref Range Status   MRSA by PCR NEGATIVE NEGATIVE Final    Comment:        The GeneXpert MRSA Assay (FDA approved for NASAL specimens only), is one component of a comprehensive MRSA colonization surveillance program. It is not intended to diagnose MRSA infection nor to guide or monitor treatment for MRSA infections. Performed at [facility], [place] [address] [place], [place] [address]          Radiology Studies: Dg Chest 1 View  Result Date: 03/18/2018 CLINICAL DATA:  Hip pain. EXAM: CHEST  1 VIEW COMPARISON:  Chest radiograph Jan 04, 2004 FINDINGS: Cardiomediastinal silhouette is normal. Calcified aortic arch. No pleural effusions or focal consolidations. Hyperinflation. Large bulla LEFT upper lung zone. Trachea projects midline and there is no pneumothorax. Soft tissue planes and included osseous structures are non-suspicious. Osteopenia. IMPRESSION: Hyperinflation without focal consolidation. Aortic Atherosclerosis (ICD10-I70.0). Electronically Signed   By: Elon Alas M.D.   On: 03/18/2018  01:52   Dg C-arm 1-60 Min  Result Date: 03/18/2018 CLINICAL DATA:  Left intratrochanteric intramedullary rod EXAM: DG C-ARM 61-120 MIN; OPERATIVE LEFT HIP WITH PELVIS FLUOROSCOPY TIME:  166 seconds COMPARISON:  Left hip radiographs-earlier same day FINDINGS: Five spot intraoperative fluoroscopic images of the right hip are provided for review. Images demonstrate the sequela intramedullary rod fixation of the left femur and dynamic screw fixation of the left femoral neck. The distal end of the femoral intramedullary rod is transfixed with a single cancellous screw. Alignment appears markedly improved. Scattered foci of subcutaneous emphysema about the operative site. No radiopaque foreign body. IMPRESSION: Post ORIF of left intertrochanteric femur fracture as above. Electronically Signed   By: Sandi Mariscal M.D.   On: 03/18/2018 13:27   Dg Hip Operative Unilat With Pelvis Left  Result Date: 03/18/2018 CLINICAL DATA:  Left intratrochanteric intramedullary rod EXAM: DG C-ARM 61-120 MIN; OPERATIVE LEFT HIP WITH PELVIS FLUOROSCOPY TIME:  166 seconds COMPARISON:  Left hip radiographs-earlier same day FINDINGS: Five spot intraoperative fluoroscopic images of the right hip are provided for review. Images demonstrate the sequela intramedullary rod fixation of the left femur  and dynamic screw fixation of the left femoral neck. The distal end of the femoral intramedullary rod is transfixed with a single cancellous screw. Alignment appears markedly improved. Scattered foci of subcutaneous emphysema about the operative site. No radiopaque foreign body. IMPRESSION: Post ORIF of left intertrochanteric femur fracture as above. Electronically Signed   By: Sandi Mariscal M.D.   On: 03/18/2018 13:27   Dg Hip Unilat With Pelvis 2-3 Views Left  Result Date: 03/18/2018 CLINICAL DATA:  Left hip pain and deformity after fall. EXAM: DG HIP (WITH OR WITHOUT PELVIS) 2-3V LEFT COMPARISON:  None. FINDINGS: Comminuted and displaced  intertrochanteric left proximal femur fracture with proximal migration of the femoral shaft. Displacement involves the both lesser and greater trochanters. The femoral head remains seated. Pubic rami are intact. No additional pelvic fracture. Pelvic calcifications likely fibroids. There are also vascular calcifications. IMPRESSION: Comminuted displaced intertrochanteric left proximal femur fracture with proximal migration of the femoral shaft. Electronically Signed   By: Jeb Levering M.D.   On: 03/18/2018 01:40   Dg Femur Port Min 2 Views Left  Result Date: 03/18/2018 CLINICAL DATA:  Post op. Left intratrochanteric intramedullary rod EXAM: LEFT FEMUR PORTABLE 2 VIEWS COMPARISON:  03/18/2018 FINDINGS: Status post intramedullary nail and lag screw fixation of an intertrochanteric fracture of the LEFT hip. There is no dislocation or interval fracture. Expected postoperative soft tissue gas noted. IMPRESSION: Status post ORIF of the LEFT hip. Electronically Signed   By: Nolon Nations M.D.   On: 03/18/2018 14:12        Scheduled Meds: . cholecalciferol  2,000 Units Oral Daily  . enoxaparin (LOVENOX) injection  40 mg Subcutaneous Q24H  . feeding supplement  237 mL Oral TID BM  . folic acid  1 mg Oral Daily  . LORazepam  0-4 mg Intravenous Q6H   Followed by  . [START ON 03/20/2018] LORazepam  0-4 mg Intravenous Q12H  . multivitamin with minerals  1 tablet Oral Daily  . nicotine  14 mg Transdermal Daily  . thiamine  100 mg Oral Daily   Or  . thiamine  100 mg Intravenous Daily   Continuous Infusions: .  ceFAZolin (ANCEF) IV 2 g (03/19/18 0610)  . methocarbamol (ROBAXIN) IV       LOS: 1 day    Time spent: [place], MD [facility]  If 7PM-7AM, please contact night-coverage [email or web address] Password [email or web address] 03/19/2018, 12:15 PM

## 2018-03-19 NOTE — Evaluation (Signed)
Physical Therapy Evaluation Patient Details Name: Veronica Stewart MRN: [id number] DOB: Aug 09, 1956 Today's Date: 03/19/2018   History of Present Illness  Pt is a 62 y.o. F with significant PMH of asthma, hypertension, alcohol use with displaced left intertrochanteric femur fracture s/p cephalomedullary nailing.  Clinical Impression  Patient presents with functional limitations in mobility and transfers due to left hip pain, decreased LLE range of motion, gross weakness of LLE, and balance deficits. Requiring min assist for mobility at this time, ambulating in room 15 feet with the walker. Poor compliance overall and understanding of weightbearing precaution. Needs intensive gait training, strengthening, and range of motion exercises. Recommending CIR to maximize functional independence. Suspect patient will progress well based on age, motivation, and PLOF.  Will follow acutely to progress mobility.      Follow Up Recommendations CIR    Equipment Recommendations  Rolling walker with 5" wheels;3in1 (PT)    Recommendations for Other Services       Precautions / Restrictions Precautions Precautions: Fall Restrictions Weight Bearing Restrictions: Yes LLE Weight Bearing: Touchdown weight bearing      Mobility  Bed Mobility Overal bed mobility: Needs Assistance Bed Mobility: Supine to Sit     Supine to sit: Min assist     General bed mobility comments: min assist provided to advance LLE due to gross weakness  Transfers Overall transfer level: Needs assistance Equipment used: Rolling walker (2 wheeled) Transfers: Sit to/from Stand Sit to Stand: Min assist         General transfer comment: Min assist provided from elevated surface to boost up to standing as well as min assist provided to extend left leg prior to sitting as patient had inadequate strength to perform independently. Poor compliance with weightbearing precautions during transfer.  Ambulation/Gait Ambulation/Gait  assistance: Min assist Gait Distance (Feet): 15 Feet Assistive device: Rolling walker (2 wheeled) Gait Pattern/deviations: Step-to pattern;Antalgic;Decreased weight shift to left;Decreased stance time - left Gait velocity: decr Gait velocity interpretation: <1.31 ft/sec, indicative of household ambulator General Gait Details: Patient requiring max cues for sequencing, precautions, and walker proximity. RLE held in internal rotation during gait.   Stairs            Wheelchair Mobility    Modified Rankin (Stroke Patients Only)       Balance Overall balance assessment: Needs assistance Sitting-balance support: No upper extremity supported;Feet supported Sitting balance-Leahy Scale: Good     Standing balance support: Bilateral upper extremity supported Standing balance-Leahy Scale: Fair                               Pertinent Vitals/Pain Pain Assessment: Faces Faces Pain Scale: Hurts even more Pain Location: left hip Pain Descriptors / Indicators: Grimacing;Operative site guarding;Moaning Pain Intervention(s): Limited activity within patient's tolerance;Monitored during session    Home Living Family/patient expects to be discharged to:: Private residence Living Arrangements: Other (Comment);Other relatives(aunt) Available Help at Discharge: Family;Available PRN/intermittently Type of Home: House Home Access: Level entry(back entrance)     Home Layout: One level Home Equipment: None      Prior Function Level of Independence: Independent         Comments: Reports 2 falls in last month     Hand Dominance        Extremity/Trunk Assessment   Upper Extremity Assessment Upper Extremity Assessment: Defer to OT evaluation    Lower Extremity Assessment Lower Extremity Assessment: RLE deficits/detail RLE Deficits / Details: s/p  cephalomedullary nailing. MMT: knee extension 2/5, ankle dorsiflexion/plantarflexion 2/5. RLE resting in internal  rotation.     Cervical / Trunk Assessment Cervical / Trunk Assessment: Normal  Communication   Communication: No difficulties  Cognition Arousal/Alertness: Awake/alert Behavior During Therapy: WFL for tasks assessed/performed Overall Cognitive Status: Within Functional Limits for tasks assessed                                 General Comments: Some decreased awareness of precautions noted      General Comments General comments (skin integrity, edema, etc.): Bandages intact     Exercises General Exercises - Lower Extremity Ankle Circles/Pumps: 10 reps;Both;Seated Quad Sets: 10 reps;Left;Seated   Assessment/Plan    PT Assessment Patient needs continued PT services  PT Problem List Decreased strength;Decreased range of motion;Decreased activity tolerance;Decreased balance;Decreased mobility;Decreased knowledge of precautions;Pain       PT Treatment Interventions DME instruction;Gait training;Stair training;Functional mobility training;Therapeutic activities;Balance training;Therapeutic exercise;Patient/family education    PT Goals (Current goals can be found in the Care Plan section)  Acute Rehab PT Goals Patient Stated Goal: go to the beach in [place] PT Goal Formulation: With patient Time For Goal Achievement: 04/02/18 Potential to Achieve Goals: Good    Frequency Min 5X/week   Barriers to discharge        Co-evaluation               AM-PAC PT "6 Clicks" Daily Activity  Outcome Measure Difficulty turning over in bed (including adjusting bedclothes, sheets and blankets)?: A Little Difficulty moving from lying on back to sitting on the side of the bed? : Unable Difficulty sitting down on and standing up from a chair with arms (e.g., wheelchair, bedside commode, etc,.)?: Unable Help needed moving to and from a bed to chair (including a wheelchair)?: A Little Help needed walking in hospital room?: A Little Help needed climbing 3-5 steps with a  railing? : A Lot 6 Click Score: 13    End of Session Equipment Utilized During Treatment: Gait belt Activity Tolerance: Patient limited by pain Patient left: in chair;with call bell/phone within reach Nurse Communication: Mobility status PT Visit Diagnosis: Other abnormalities of gait and mobility (R26.89);Unsteadiness on feet (R26.81);History of falling (Z91.81);Difficulty in walking, not elsewhere classified (R26.2);Pain Pain - Right/Left: Left Pain - part of body: Hip    Time: 2956-2130 PT Time Calculation (min) (ACUTE ONLY): 38 min   Charges:   PT Evaluation $PT Eval Low Complexity: 1 Low PT Treatments $Gait Training: 8-22 mins $Therapeutic Activity: 8-22 mins       Ellamae Sia, PT, DPT Acute Rehabilitation Services  Pager: [place] 03/19/2018, 10:12 AM

## 2018-03-19 NOTE — Progress Notes (Signed)
Inpatient Rehabilitation  Per PT and OT request, patient was screened by Gunnar Fusi for appropriateness for an Inpatient Acute Rehab consult.  At this time we are recommending an Inpatient Rehab consult.  Text paged MD to notify.  Please order if you are agreeable.    Carmelia Roller., CCC/SLP Admission Coordinator  [place]  Cell [phone]

## 2018-03-20 ENCOUNTER — Encounter (HOSPITAL_COMMUNITY): Payer: Self-pay | Admitting: Student

## 2018-03-20 DIAGNOSIS — I10 Essential (primary) hypertension: Secondary | ICD-10-CM

## 2018-03-20 DIAGNOSIS — D509 Iron deficiency anemia, unspecified: Secondary | ICD-10-CM

## 2018-03-20 DIAGNOSIS — G8918 Other acute postprocedural pain: Secondary | ICD-10-CM

## 2018-03-20 DIAGNOSIS — D62 Acute posthemorrhagic anemia: Secondary | ICD-10-CM

## 2018-03-20 DIAGNOSIS — S72002A Fracture of unspecified part of neck of left femur, initial encounter for closed fracture: Secondary | ICD-10-CM

## 2018-03-20 DIAGNOSIS — E871 Hypo-osmolality and hyponatremia: Secondary | ICD-10-CM

## 2018-03-20 LAB — BASIC METABOLIC PANEL WITH GFR
Anion gap: 7 (ref 5–15)
CO2: 29 mmol/L (ref 22–32)
Glucose, Bld: 90 mg/dL (ref 70–99)
Potassium: 4.1 mmol/L (ref 3.5–5.1)

## 2018-03-20 LAB — BASIC METABOLIC PANEL
BUN: <5 mg/dL — ABNORMAL LOW (ref 8–23)
Calcium: 8.5 mg/dL — ABNORMAL LOW (ref 8.9–10.3)
Chloride: 97 mmol/L — ABNORMAL LOW (ref 98–111)
Creatinine, Ser: 0.65 mg/dL (ref 0.44–1.00)
GFR calc Af Amer: >60 mL/min (ref >60)
GFR calc non Af Amer: >60 mL/min (ref >60)
Sodium: 133 mmol/L — ABNORMAL LOW (ref 135–145)

## 2018-03-20 LAB — CBC
HCT: 28.2 % — ABNORMAL LOW (ref 36.0–46.0)
Hemoglobin: 9.4 g/dL — ABNORMAL LOW (ref 12.0–15.0)
MCH: 30 pg (ref 26.0–34.0)
MCHC: 33.3 g/dL (ref 30.0–36.0)
MCV: 90.1 fL (ref 78.0–100.0)
Platelets: 116 10*3/uL — ABNORMAL LOW (ref 150–400)
RBC: 3.13 MIL/uL — ABNORMAL LOW (ref 3.87–5.11)
RDW: 15 % (ref 11.5–15.5)
WBC: 5.1 10*3/uL (ref 4.0–10.5)

## 2018-03-20 NOTE — Progress Notes (Signed)
Inpatient Rehabilitation-Admissions Coordinator   Spoke with pt at the bedside as follow up from PM&R consult. Discussed with pt the expectations of rehab, estimated length of stay, functional level at DC as well as estimated daily cost of CIR as pt is currently uninsured.   Pt showing some concern regarding payment and gave permission for [facility] to contact her aunt for further discussion. Pt lives with her aunt, who is an Engineer, production for older adults and works approv 1-2 hours/day. Like the pt, the aunt is unsure how the pt would be able to afford CIR based on the estimated daily cost. According to the chart, pt is currently in contact with financial counselor to determine if pt is eligible for Medicaid. Discussed there is no guarantee pt will be approved for Medicaid. The pt's aunt feels she can assist the pt at home at her current level of Min A. AC encouraged the pt and her aunt to have extensive discussion regarding preferences and support; they plan to have a decision regarding CIR tomorrow morning.   Please call if questions.   Jhonnie Garner, OTR/L  Rehab Admissions Coordinator  [phone] 03/20/2018 6:42 PM

## 2018-03-20 NOTE — Progress Notes (Signed)
PROGRESS NOTE    Veronica Stewart  CLE:[id number] DOB: 1955-11-02 DOA: 03/18/2018 PCP: Patient, No Pcp Per    Brief Narrative:  62 year old with past medical history relevant for asthma, alcohol abuse, untreated hypertension who comes in with a mechanical fall and subsequent development of left hip fracture status post nail on 03/18/2018.   Assessment & Plan:   Principal Problem:   Closed left hip fracture, initial encounter [facility]) Active Problems:   Hypertension   Alcohol dependence with acute alcoholic intoxication (Veronica Stewart)   Hyponatremia   Asthma   Protein-calorie malnutrition, severe   Post-operative pain   Acute blood loss anemia   #) Left hip fracture status post intramedullary nail on 03/18/2017: Patient is doing well -Orthopedic surgery following, appreciate recommendations -Pain control - Prophylaxis will defer to orthopedic surgery at this time - PT recommends code inpatient rehab, rolling 5 inch walker and 3 and 1, occupational therapy recommends 21 bedside commode -Pending inpatient rehab bed  #) Alcohol abuse with acute alcohol intoxication: Patient reports that she does drink quite a bit but has never had withdrawals or seizures -CIWA protocol -Continue thiamine and folate  #) Hyponatremia: Resolved with IV fluids  #) Asthma: -Continue PRN bronchodilators  #) Hypertension:  -Continue amlodipine 5 mg daily  Fluids: Tolerating p.o. Elect lites: Monitor and supplement Nutrition: Heart healthy diet  Prophylaxis: Enoxaparin  Disposition: Pending acceptance to [facility] inpatient rehab   Full code   Consultants:   Orthopedic surgery  PM&R  Procedures:   03/18/2018 left intramedullary nail  Antimicrobials:   Perioperative antibiotics   Subjective: She reports that she is doing well.  She is sitting up in bed.  She denies any nausea, vomiting, diarrhea, cough, congestion, rhinorrhea.  She does have some pain at the incision site.  Objective: Vitals:   03/19/18 0614 03/19/18 1425 03/19/18 2014 03/20/18 0618  BP: (!) 154/104 (!) 162/98 (!) 146/106 (!) 140/103  Pulse: 77 81 93 98  Resp: 16 16    Temp: 98.6 F (37 C) 98.5 F (36.9 C) 98.3 F (36.8 C) 98.8 F (37.1 C)  TempSrc: Oral Oral Oral Oral  SpO2: 100% 100% 98% 98%  Weight:      Height:        Intake/Output Summary (Last 24 hours) at 03/20/2018 1115 Last data filed at 03/20/2018 0900 Gross per 24 hour  Intake 220 ml  Output -  Net 220 ml   Filed Weights   03/18/18 0051 03/18/18 1046  Weight: 61.2 kg (135 lb) 53.3 kg (117 lb 8.1 oz)    Examination:  General exam: No acute distress Respiratory system: Clear to auscultation. Respiratory effort normal. Cardiovascular system: Regular rate and rhythm, no murmurs Gastrointestinal system: Abdomen is nondistended, soft and nontender. No organomegaly or masses felt. Normal bowel sounds heard. Central nervous system: Alert and oriented. No focal neurological deficits. Extremities: Hesitant to move left leg, able to wiggle toes, intact distal pulses, trace lower extremity edema Skin: Incision site is somewhat bloody but intact Psychiatry: Judgement and insight appear normal. Mood & affect appropriate.     Data Reviewed: I have personally reviewed following labs and imaging studies  CBC: Recent Labs  Lab 03/18/18 0213 03/19/18 0440 03/20/18 0520  WBC 8.9 6.3 5.1  NEUTROABS 7.6  --   --   HGB 14.7 10.6* 9.4*  HCT 44.2 32.8* 28.2*  MCV 88.4 90.9 90.1  PLT 189 132* 944*   Basic Metabolic Panel: Recent Labs  Lab 03/18/18 0213 03/19/18 0440 03/20/18 0520  NA 130* 131* 133*  K 4.0 5.0 4.1  CL 94* 96* 97*  CO2 24 28 29   GLUCOSE 111* 109* 90  BUN 5* 6* <5*  CREATININE 0.71 0.68 0.65  CALCIUM 8.6* 8.5* 8.5*  MG  --  1.8  --    GFR: Estimated Creatinine Clearance: 62.1 mL/min (by C-G formula based on SCr of 0.65 mg/dL). Liver Function Tests: Recent Labs  Lab 03/19/18 0440  AST 23  ALT 11  ALKPHOS 42  BILITOT  0.8  PROT 5.8*  ALBUMIN 3.1*   No results for input(s): LIPASE, AMYLASE in the last 168 hours. No results for input(s): AMMONIA in the last 168 hours. Coagulation Profile: Recent Labs  Lab 03/18/18 0213  INR 1.07   Cardiac Enzymes: No results for input(s): CKTOTAL, CKMB, CKMBINDEX, TROPONINI in the last 168 hours. BNP (last 3 results) No results for input(s): PROBNP in the last 8760 hours. HbA1C: No results for input(s): HGBA1C in the last 72 hours. CBG: No results for input(s): GLUCAP in the last 168 hours. Lipid Profile: No results for input(s): CHOL, HDL, LDLCALC, TRIG, CHOLHDL, LDLDIRECT in the last 72 hours. Thyroid Function Tests: No results for input(s): TSH, T4TOTAL, FREET4, T3FREE, THYROIDAB in the last 72 hours. Anemia Panel: No results for input(s): VITAMINB12, FOLATE, FERRITIN, TIBC, IRON, RETICCTPCT in the last 72 hours. Sepsis Labs: No results for input(s): PROCALCITON, LATICACIDVEN in the last 168 hours.  Recent Results (from the past 240 hour(s))  MRSA PCR Screening     Status: None   Collection Time: 03/18/18  6:50 AM  Result Value Ref Range Status   MRSA by PCR NEGATIVE NEGATIVE Final    Comment:        The GeneXpert MRSA Assay (FDA approved for NASAL specimens only), is one component of a comprehensive MRSA colonization surveillance program. It is not intended to diagnose MRSA infection nor to guide or monitor treatment for MRSA infections. Performed at [facility], [place] [address], [place], [place] [address]          Radiology Studies: Dg C-arm 1-60 Min  Result Date: 03/18/2018 CLINICAL DATA:  Left intratrochanteric intramedullary rod EXAM: DG C-ARM 61-120 MIN; OPERATIVE LEFT HIP WITH PELVIS FLUOROSCOPY TIME:  166 seconds COMPARISON:  Left hip radiographs-earlier same day FINDINGS: Five spot intraoperative fluoroscopic images of the right hip are provided for review. Images demonstrate the sequela intramedullary rod fixation of the left  femur and dynamic screw fixation of the left femoral neck. The distal end of the femoral intramedullary rod is transfixed with a single cancellous screw. Alignment appears markedly improved. Scattered foci of subcutaneous emphysema about the operative site. No radiopaque foreign body. IMPRESSION: Post ORIF of left intertrochanteric femur fracture as above. Electronically Signed   By: Sandi Mariscal M.D.   On: 03/18/2018 13:27   Dg Hip Operative Unilat With Pelvis Left  Result Date: 03/18/2018 CLINICAL DATA:  Left intratrochanteric intramedullary rod EXAM: DG C-ARM 61-120 MIN; OPERATIVE LEFT HIP WITH PELVIS FLUOROSCOPY TIME:  166 seconds COMPARISON:  Left hip radiographs-earlier same day FINDINGS: Five spot intraoperative fluoroscopic images of the right hip are provided for review. Images demonstrate the sequela intramedullary rod fixation of the left femur and dynamic screw fixation of the left femoral neck. The distal end of the femoral intramedullary rod is transfixed with a single cancellous screw. Alignment appears markedly improved. Scattered foci of subcutaneous emphysema about the operative site. No radiopaque foreign body. IMPRESSION: Post ORIF of left intertrochanteric femur fracture  as above. Electronically Signed   By: Sandi Mariscal M.D.   On: 03/18/2018 13:27   Dg Femur Port Min 2 Views Left  Result Date: 03/18/2018 CLINICAL DATA:  Post op. Left intratrochanteric intramedullary rod EXAM: LEFT FEMUR PORTABLE 2 VIEWS COMPARISON:  03/18/2018 FINDINGS: Status post intramedullary nail and lag screw fixation of an intertrochanteric fracture of the LEFT hip. There is no dislocation or interval fracture. Expected postoperative soft tissue gas noted. IMPRESSION: Status post ORIF of the LEFT hip. Electronically Signed   By: Nolon Nations M.D.   On: 03/18/2018 14:12        Scheduled Meds: . amLODipine  5 mg Oral Daily  . cholecalciferol  2,000 Units Oral Daily  . enoxaparin (LOVENOX) injection  40 mg  Subcutaneous Q24H  . feeding supplement  237 mL Oral TID BM  . folic acid  1 mg Oral Daily  . LORazepam  0-4 mg Intravenous Q12H  . multivitamin with minerals  1 tablet Oral Daily  . nicotine  14 mg Transdermal Daily  . thiamine  100 mg Oral Daily   Or  . thiamine  100 mg Intravenous Daily   Continuous Infusions: . methocarbamol (ROBAXIN) IV       LOS: 2 days    Time spent: [place], MD [facility]  If 7PM-7AM, please contact night-coverage [email or web address] Password [email or web address] 03/20/2018, 11:15 AM

## 2018-03-20 NOTE — Consult Note (Signed)
Physical Medicine and Rehabilitation Consult Reason for Consult: Decreased functional mobility Referring Physician: Internal medicine   HPI: Veronica Stewart is a 62 y.o. right-handed female with history of hypertension, tobacco/alcohol abuse.  Per chart review an patient, patient lives with and aunt.  Independent prior to admission.  One level home.  Aunt and uncle reportedly can assist on discharge.  Presented 03/18/2018 after a fall with left hip pain.  Denied loss of consciousness.  Alcohol level 190.  X-rays and imaging revealed left intertrochanteric femur fracture.  Underwent nailing 03/18/2018 per Dr. Doreatha Martin.  Hospital course pain management.  Touchdown weightbearing left lower extremity.  Subcutaneous Lovenox for DVT prophylaxis.  Acute blood loss anemia 10.6.  Physical and occupational therapy evaluations completed with recommendations of physical medicine rehab consult.   Review of Systems  Constitutional: Negative for chills and fever.  HENT: Negative for hearing loss.   Eyes: Negative for blurred vision and double vision.  Respiratory: Negative for cough and shortness of breath.   Cardiovascular: Negative for chest pain, palpitations and leg swelling.  Gastrointestinal: Positive for constipation. Negative for nausea.  Genitourinary: Negative for dysuria, flank pain and hematuria.  Musculoskeletal: Positive for falls, joint pain and myalgias.  Skin: Negative for rash.  Neurological: Negative for seizures.  All other systems reviewed and are negative.  Past Medical History:  Diagnosis Date  . Asthma   . Hypertension    History reviewed. No pertinent surgical history. Family History  Problem Relation Age of Onset  . Sudden Cardiac Death Neg Hx    Social History:  reports that she has been smoking cigarettes.  She has been smoking about 0.50 packs per day. She has never used smokeless tobacco. She reports that she drinks alcohol. She reports that she has current or past  drug history. Drug: Marijuana. Allergies: No Known Allergies Medications Prior to Admission  Medication Sig Dispense Refill  . aspirin 325 MG tablet Take 325 mg by mouth every 6 (six) hours as needed for mild pain.      Home: Home Living Family/patient expects to be discharged to:: Private residence Living Arrangements: Other (Comment), Other relatives(aunt) Available Help at Discharge: Family, Available PRN/intermittently Type of Home: House Home Access: Level entry(back entrance) Home Layout: One level Bathroom Shower/Tub: Tub/shower unit, Architectural technologist: Standard Home Equipment: None  Functional History: Prior Function Level of Independence: Independent Comments: Reports 2 falls in last month Functional Status:  Mobility: Bed Mobility Overal bed mobility: Needs Assistance Bed Mobility: Sit to Supine Supine to sit: Min assist Sit to supine: Min assist General bed mobility comments: Min A for managing LLE over EOB Transfers Overall transfer level: Needs assistance Equipment used: Rolling walker (2 wheeled) Transfers: Sit to/from Stand Sit to Stand: Min assist General transfer comment: Min A for safety. Pt with poor compliance to WBing precautions despite increased cues and demonstration of appropiate pressure.  Ambulation/Gait Ambulation/Gait assistance: Min assist Gait Distance (Feet): 15 Feet Assistive device: Rolling walker (2 wheeled) Gait Pattern/deviations: Step-to pattern, Antalgic, Decreased weight shift to left, Decreased stance time - left General Gait Details: Patient requiring max cues for sequencing, precautions, and walker proximity. RLE held in internal rotation during gait.  Gait velocity: decr Gait velocity interpretation: <1.31 ft/sec, indicative of household ambulator    ADL: ADL Overall ADL's : Needs assistance/impaired Eating/Feeding: Set up, Sitting Grooming: Set up, Sitting Upper Body Bathing: Set up, Sitting Lower Body Bathing:  Moderate assistance, Sit to/from stand Upper Body Dressing : Set  up, Sitting Lower Body Dressing: Moderate assistance, Sit to/from stand Lower Body Dressing Details (indicate cue type and reason): Pt able to adjust right sock by bringing legs upward towards torso. Unable to bend forward for LLE.  Toilet Transfer: Minimal assistance, Ambulation, BSC, RW Toilet Transfer Details (indicate cue type and reason): Min A for safety Toileting- Clothing Manipulation and Hygiene: Minimal assistance, Sitting/lateral lean, Sit to/from stand Functional mobility during ADLs: Minimal assistance, Rolling walker General ADL Comments: Pt demonstrating pooor adherance to [place] status with decreased awareness. Pt stating "yeah, im doing that" when reeducated about TDWBing. Pt motivated to return to PLOF.  Cognition: Cognition Overall Cognitive Status: Impaired/Different from baseline Orientation Level: Oriented X4 Cognition Arousal/Alertness: Awake/alert Behavior During Therapy: WFL for tasks assessed/performed Overall Cognitive Status: Impaired/Different from baseline Area of Impairment: Safety/judgement, Awareness Safety/Judgement: Decreased awareness of safety(Decreased awareness of weightbearing precautions) Awareness: Emergent General Comments: Feel pt is close to her baseline cognition. Some decreased awareness of precautions noted. Requiring increased cues for adherance to [place] status.  Blood pressure (!) 146/106, pulse 93, temperature 98.3 F (36.8 C), temperature source Oral, resp. rate 16, height 5\' 10"  (1.778 m), weight 53.3 kg (117 lb 8.1 oz), SpO2 98 %. Physical Exam  Vitals reviewed. Constitutional: She is oriented to person, place, and time. She appears well-developed.  Frail  HENT:  Head: Normocephalic and atraumatic.  Poor dentition  Eyes: EOM are normal. Right eye exhibits no discharge. Left eye exhibits no discharge. Scleral icterus is present.  Neck: Normal range of motion. Neck  supple. No thyromegaly present.  Cardiovascular: Normal rate, regular rhythm and normal heart sounds.  Respiratory: Effort normal and breath sounds normal. No respiratory distress.  GI: Soft. Bowel sounds are normal. She exhibits no distension.  Musculoskeletal:  Left hip edema and tenderness  Neurological: She is alert and oriented to person, place, and time.  Motor: Bilateral upper extremities: 5/5 proximal distal Right lower extremity: Hip flexion, knee extension 4 minus/5, ankle dorsiflexion 5/5 Left lower extremity: Hip flexion 2/5, ankle dorsiflexion 5/5 Sensation intact light touch  Skin:  Hip incision is clean and dry  Psychiatric: Her affect is blunt. She is slowed.    No results found for this or any previous visit (from the past 24 hour(s)). Dg C-arm 1-60 Min  Result Date: 03/18/2018 CLINICAL DATA:  Left intratrochanteric intramedullary rod EXAM: DG C-ARM 61-120 MIN; OPERATIVE LEFT HIP WITH PELVIS FLUOROSCOPY TIME:  166 seconds COMPARISON:  Left hip radiographs-earlier same day FINDINGS: Five spot intraoperative fluoroscopic images of the right hip are provided for review. Images demonstrate the sequela intramedullary rod fixation of the left femur and dynamic screw fixation of the left femoral neck. The distal end of the femoral intramedullary rod is transfixed with a single cancellous screw. Alignment appears markedly improved. Scattered foci of subcutaneous emphysema about the operative site. No radiopaque foreign body. IMPRESSION: Post ORIF of left intertrochanteric femur fracture as above. Electronically Signed   By: Sandi Mariscal M.D.   On: 03/18/2018 13:27   Dg Hip Operative Unilat With Pelvis Left  Result Date: 03/18/2018 CLINICAL DATA:  Left intratrochanteric intramedullary rod EXAM: DG C-ARM 61-120 MIN; OPERATIVE LEFT HIP WITH PELVIS FLUOROSCOPY TIME:  166 seconds COMPARISON:  Left hip radiographs-earlier same day FINDINGS: Five spot intraoperative fluoroscopic images of the  right hip are provided for review. Images demonstrate the sequela intramedullary rod fixation of the left femur and dynamic screw fixation of the left femoral neck. The distal end of the femoral intramedullary rod  is transfixed with a single cancellous screw. Alignment appears markedly improved. Scattered foci of subcutaneous emphysema about the operative site. No radiopaque foreign body. IMPRESSION: Post ORIF of left intertrochanteric femur fracture as above. Electronically Signed   By: Sandi Mariscal M.D.   On: 03/18/2018 13:27   Dg Femur Port Min 2 Views Left  Result Date: 03/18/2018 CLINICAL DATA:  Post op. Left intratrochanteric intramedullary rod EXAM: LEFT FEMUR PORTABLE 2 VIEWS COMPARISON:  03/18/2018 FINDINGS: Status post intramedullary nail and lag screw fixation of an intertrochanteric fracture of the LEFT hip. There is no dislocation or interval fracture. Expected postoperative soft tissue gas noted. IMPRESSION: Status post ORIF of the LEFT hip. Electronically Signed   By: Nolon Nations M.D.   On: 03/18/2018 14:12    Assessment/Plan: Diagnosis: left hip fracture Labs independently reviewed.  Records reviewed and summated above.  1. Does the need for close, 24 hr/day medical supervision in concert with the patient's rehab needs make it unreasonable for this patient to be served in a less intensive setting? Yes  2. Co-Morbidities requiring supervision/potential complications: HTN (monitor and provide prns in accordance with increased physical exertion and pain), tobacco/alcohol abuse (CIWA, counsel), post-op pain management (Biofeedback training with therapies to help reduce reliance on opiate pain medications, monitor pain control during therapies, and sedation at rest and titrate to maximum efficacy to ensure participation and gains in therapies), acute blood loss anemia (transfuse if necessary to ensure appropriate perfusion for increased activity tolerance), hyponatremia (continue to  monitor, treated necessary) 3. Due to safety, skin/wound care, disease management, pain management and patient education, does the patient require 24 hr/day rehab nursing? Yes 4. Does the patient require coordinated care of a physician, rehab nurse, PT (1-2 hrs/day, 5 days/week) and OT (1-2 hrs/day, 5 days/week) to address physical and functional deficits in the context of the above medical diagnosis(es)? Yes Addressing deficits in the following areas: balance, endurance, locomotion, strength, transferring, bathing, dressing, toileting and psychosocial support 5. Can the patient actively participate in an intensive therapy program of at least 3 hrs of therapy per day at least 5 days per week? Yes 6. The potential for patient to make measurable gains while on inpatient rehab is excellent 7. Anticipated functional outcomes upon discharge from inpatient rehab are modified independent and supervision  with PT, modified independent and supervision with OT, n/a with SLP. 8. Estimated rehab length of stay to reach the above functional goals is: 8-12 days. 9. Anticipated D/C setting: Home 10. Anticipated post D/C treatments: HH therapy and Home excercise program 11. Overall Rehab/Functional Prognosis: excellent  RECOMMENDATIONS: This patient's condition is appropriate for continued rehabilitative care in the following setting: CIR Patient has agreed to participate in recommended program. Yes Note that insurance prior authorization may be required for reimbursement for recommended care.  Comment: Rehab Admissions Coordinator to follow up.   I have personally performed a face to face diagnostic evaluation, including, but not limited to relevant history and physical exam findings, of this patient and developed relevant assessment and plan.  Additionally, I have reviewed and concur with the physician assistant's documentation above.   Delice Lesch, MD, ABPMR Lavon Paganini Angiulli, PA-C 03/20/2018

## 2018-03-20 NOTE — Plan of Care (Signed)
  Problem: Activity: Goal: Risk for activity intolerance will decrease Outcome: Progressing   Problem: Nutrition: Goal: Adequate nutrition will be maintained Outcome: Progressing   Problem: Coping: Goal: Level of anxiety will decrease Outcome: Progressing   Problem: Elimination: Goal: Will not experience complications related to bowel motility Outcome: Progressing Goal: Will not experience complications related to urinary retention Outcome: Progressing   Problem: Elimination: Goal: Will not experience complications related to bowel motility Outcome: Progressing Goal: Will not experience complications related to urinary retention Outcome: Progressing   Problem: Pain Managment: Goal: General experience of comfort will improve Outcome: Progressing   Problem: Skin Integrity: Goal: Risk for impaired skin integrity will decrease Outcome: Progressing

## 2018-03-21 LAB — BASIC METABOLIC PANEL WITH GFR
Anion gap: 8 (ref 5–15)
Calcium: 8.5 mg/dL — ABNORMAL LOW (ref 8.9–10.3)
GFR calc Af Amer: >60 mL/min (ref >60)
GFR calc non Af Amer: >60 mL/min (ref >60)
Potassium: 3.7 mmol/L (ref 3.5–5.1)
Sodium: 133 mmol/L — ABNORMAL LOW (ref 135–145)

## 2018-03-21 LAB — CBC
HCT: 27.1 % — ABNORMAL LOW (ref 36.0–46.0)
Hemoglobin: 8.8 g/dL — ABNORMAL LOW (ref 12.0–15.0)
MCH: 29.2 pg (ref 26.0–34.0)
MCHC: 32.5 g/dL (ref 30.0–36.0)
MCV: 90 fL (ref 78.0–100.0)
Platelets: 135 K/uL — ABNORMAL LOW (ref 150–400)
RBC: 3.01 MIL/uL — ABNORMAL LOW (ref 3.87–5.11)
RDW: 14.9 % (ref 11.5–15.5)
WBC: 5 K/uL (ref 4.0–10.5)

## 2018-03-21 LAB — MAGNESIUM: Magnesium: 1.9 mg/dL (ref 1.7–2.4)

## 2018-03-21 LAB — BASIC METABOLIC PANEL
BUN: 6 mg/dL — ABNORMAL LOW (ref 8–23)
CO2: 31 mmol/L (ref 22–32)
Chloride: 94 mmol/L — ABNORMAL LOW (ref 98–111)
Creatinine, Ser: 0.64 mg/dL (ref 0.44–1.00)
Glucose, Bld: 96 mg/dL (ref 70–99)

## 2018-03-21 NOTE — Progress Notes (Signed)
Inpatient Rehabilitation-Admissions Coordinator   Pt has chosen not to pursue CIR at this time. AC will sign off. AC has contacted SW/CM regarding needs for new dispo plans.  Jhonnie Garner, OTR/L  Rehab Admissions Coordinator  [phone] 03/21/2018 4:22 PM

## 2018-03-21 NOTE — Progress Notes (Addendum)
Physical Therapy Treatment Patient Details Name: Veronica Stewart MRN: [id number] DOB: 04-29-1956 Today's Date: 03/21/2018    History of Present Illness Pt is a 62 y.o. F with significant PMH of asthma, hypertension, alcohol use with displaced left intertrochanteric femur fracture s/p cephalomedullary nailing.    PT Comments    Pt progressing toward PT goals; pt is not going to rehab--Medicaid pending and cannot pay out of pocket; pt should be able to d/c home with family support; she states her Aunt can assist with household tasks and help her as needed; pt reports they do not have stairs to enter home; she amb ~ 15' today needing some cues for TDWB ( she is not highly motivated to mobilize but is agreeable to therapy), pt is able to verbalize  WBing status without prompting; would benefit from HHPT at d/c if possible;   Follow Up Recommendations  Home health PT;Supervision for mobility/OOB     Equipment Recommendations  Rolling walker with 5" wheels;3in1 (PT)    Recommendations for Other Services       Precautions / Restrictions Precautions Precautions: Fall Restrictions LLE Weight Bearing: Touchdown weight bearing    Mobility  Bed Mobility   Bed Mobility: Supine to Sit;Sit to Supine     Supine to sit: Min guard Sit to supine: Min assist   General bed mobility comments: assist to manage LLE onto bed, incr time needed  Transfers Overall transfer level: Needs assistance Equipment used: Rolling walker (2 wheeled) Transfers: Sit to/from Stand Sit to Stand: Min guard         General transfer comment: cues for safety and hand placement  Ambulation/Gait Ambulation/Gait assistance: Min guard Gait Distance (Feet): 30 Feet Assistive device: Rolling walker (2 wheeled)   Gait velocity: decr   General Gait Details: cues for TDWB and sequencing, RW position; fatigues easily   Stairs             Wheelchair Mobility    Modified Rankin (Stroke Patients Only)        Balance           Standing balance support: Bilateral upper extremity supported Standing balance-Leahy Scale: Poor(reliant on UE support)                              Cognition Arousal/Alertness: Awake/alert Behavior During Therapy: WFL for tasks assessed/performed                                   General Comments: requires cues for precautions/TDWB, answers questions and responds appropriately, likely close to baseline cognition      Exercises General Exercises - Lower Extremity Ankle Circles/Pumps: AROM;Both;10 reps Quad Sets: AROM;Both;10 reps Heel Slides: AAROM;Left;10 reps Hip ABduction/ADduction: AAROM;Left;10 reps;Limitations Hip Abduction/Adduction Limitations: decr ROM, limited by pain    General Comments        Pertinent Vitals/Pain Pain Assessment: Faces Faces Pain Scale: Hurts little more Pain Location: left hip Pain Descriptors / Indicators: Grimacing;Guarding;Sore Pain Intervention(s): Limited activity within patient's tolerance;Monitored during session;Premedicated before session    Home Living                      Prior Function            PT Goals (current goals can now be found in the care plan section) Acute Rehab PT Goals Patient Stated Goal: go  [place] the [place] in [place] PT Goal Formulation: With patient Time For Goal Achievement: 04/02/18 Potential to Achieve Goals: Good Progress towards PT goals: Progressing toward goals    Frequency    Min 4X/week      PT Plan Discharge plan needs to be updated    Co-evaluation              AM-PAC PT "6 Clicks" Daily Activity  Outcome Measure  Difficulty turning over in bed (including adjusting bedclothes, sheets and blankets)?: A Little Difficulty moving from lying on back to sitting on the side of the bed? : A Lot Difficulty sitting down on and standing up from a chair with arms (e.g., wheelchair, bedside commode, etc,.)?: Unable Help  needed moving to and from a bed to chair (including a wheelchair)?: A Little Help needed walking in hospital room?: A Little Help needed climbing 3-5 steps with a railing? : A Lot 6 Click Score: 14    End of Session Equipment Utilized During Treatment: Gait belt Activity Tolerance: Patient tolerated treatment well Patient left: in bed;with call bell/phone within reach;with bed alarm set   PT Visit Diagnosis: Other abnormalities of gait and mobility (R26.89);Unsteadiness on feet (R26.81);History of falling (Z91.81);Difficulty in walking, not elsewhere classified (R26.2);Pain Pain - Right/Left: Left Pain - part of body: Hip     Time: 2202-5427 PT Time Calculation (min) (ACUTE ONLY): 23 min  Charges:  $Gait Training: 8-22 mins $Therapeutic Exercise: 8-22 mins                     Kenyon Ana, PT Pager: [phone] 03/21/2018    Kenyon Ana 03/21/2018, 1:30 PM

## 2018-03-21 NOTE — Progress Notes (Signed)
Occupational Therapy Treatment Patient Details Name: [place] Stewart MRN: [id number] DOB: [birth year] Today's Date: 03/21/2018    History of present illness Pt is a 62 y.o. F with significant PMH of asthma, hypertension, alcohol use with displaced left intertrochanteric femur fracture s/p cephalomedullary nailing.   OT comments  Patient progressing well. She continues to require cueing to adhere to TDWB L LE during mobility and ADLs. She demonstrates ability to complete bed mobility with minA, toilet transfers and toileting with min guard assist, and LB ADLs with min assist.  Educated on AE for LB ADLs, fair return demonstration using sock aide, reacher and long handled sponge; would benefit from reinforcement of education (issued equipment to patient). Follow up next session on AE, tub transfers and home safety, as patients plan is to dc home now (instead of CIR).  Will continue to follow.    Follow Up Recommendations  Home health OT;Supervision/Assistance - 24 hour    Equipment Recommendations  3 in 1 bedside commode    Recommendations for Other Services PT consult    Precautions / Restrictions Precautions Precautions: Fall Restrictions Weight Bearing Restrictions: Yes LLE Weight Bearing: Touchdown weight bearing       Mobility Bed Mobility Overal bed mobility: Needs Assistance Bed Mobility: Supine to Sit;Sit to Supine     Supine to sit: Min guard Sit to supine: Min assist   General bed mobility comments: increased time and effort, assist to manage LLE into bed  Transfers Overall transfer level: Needs assistance Equipment used: Rolling walker (2 wheeled) Transfers: Sit to/from Stand Sit to Stand: Min guard         General transfer comment: cueing for safety, hand placement and technique to maintain TDWB L LE    Balance Overall balance assessment: Needs assistance Sitting-balance support: No upper extremity supported;Feet supported Sitting balance-Leahy Scale:  Good     Standing balance support: No upper extremity supported;During functional activity Standing balance-Leahy Scale: Poor Standing balance comment: min guard for toileting with 0 hand support                           ADL either performed or assessed with clinical judgement   ADL Overall ADL's : Needs assistance/impaired             Lower Body Bathing: Minimal assistance;Sit to/from stand;With adaptive equipment Lower Body Bathing Details (indicate cue type and reason): educated on safety and bathing seated, using long handled sponge due to decreased reach to B feet (issued long sponge)      Lower Body Dressing: Minimal assistance;Sit to/from stand;Cueing for compensatory techniques;With adaptive equipment Lower Body Dressing Details (indicate cue type and reason): able to bend forward and adjust R sock only; issued reacher and sock aide, return demo'd doffing/donning sock with increased time given min cueing; will benefit from further training  Toilet Transfer: Min guard;Ambulation;BSC;Comfort height toilet;RW;Cueing for safety(cueing to adhere to TDWB) Toilet Transfer Details (indicate cue type and reason): min guard for safety Toileting- Clothing Manipulation and Hygiene: Min guard;Sit to/from stand Toileting - Clothing Manipulation Details (indicate cue type and reason): min guard for safety      Functional mobility during ADLs: Min guard;Rolling walker General ADL Comments: Patient requires cueing for walker management and safety, cueing to adhere to TDWB only to L LE.      Vision       Perception     Praxis      Cognition Arousal/Alertness: Awake/alert Behavior During Therapy:  WFL for tasks assessed/performed Overall Cognitive Status: Impaired/Different from baseline Area of Impairment: Safety/judgement;Awareness                         Safety/Judgement: Decreased awareness of safety(decreased adherance to WB precautions ) Awareness:  Emergent   General Comments: likely close to baseline, but continues to require cueing to adhere to TDWB during ADL and mobility activities           Shoulder Instructions       General Comments min cueing throughout session to adhere to TDWB during transfer, mobility and ADLs.     Pertinent Vitals/ Pain       Pain Assessment: Faces Faces Pain Scale: Hurts little more Pain Location: left hip Pain Descriptors / Indicators: Grimacing;Guarding;Sore Pain Intervention(s): Limited activity within patient's tolerance;Repositioned  Home Living                                          Prior Functioning/Environment              Frequency  Min 3X/week        Progress Toward Goals  OT Goals(current goals can now be found in the care plan section)  Progress towards OT goals: Progressing toward goals  Acute Rehab OT Goals Patient Stated Goal: to get home OT Goal Formulation: With patient Time For Goal Achievement: 04/02/18 Potential to Achieve Goals: Good  Plan Frequency remains appropriate;Discharge plan needs to be updated    Co-evaluation                 AM-PAC PT "6 Clicks" Daily Activity     Outcome Measure   Help from another person eating meals?: None Help from another person taking care of personal grooming?: None Help from another person toileting, which includes using toliet, bedpan, or urinal?: A Little Help from another person bathing (including washing, rinsing, drying)?: A Little Help from another person to put on and taking off regular upper body clothing?: None Help from another person to put on and taking off regular lower body clothing?: A Little 6 Click Score: 21    End of Session Equipment Utilized During Treatment: Gait belt;Rolling walker  OT Visit Diagnosis: Unsteadiness on feet (R26.81);Other abnormalities of gait and mobility (R26.89);Muscle weakness (generalized) (M62.81);Pain;Other symptoms and signs involving  cognitive function Pain - Right/Left: Left Pain - part of body: Hip;Leg   Activity Tolerance Patient tolerated treatment well   Patient Left in bed;with call bell/phone within reach   Nurse Communication          Time: 1340-1400 OT Time Calculation (min): 20 min  Charges: OT General Charges $OT Visit: 1 Visit OT Treatments $Self Care/Home Management : 8-22 mins  Delight Stare, OTR/L  Pager [place] 03/21/2018, 3:39 PM

## 2018-03-21 NOTE — Progress Notes (Signed)
PROGRESS NOTE    [place] Stewart  YWV:[id number] DOB: 10-30-1955 DOA: 03/18/2018 PCP: Patient, No Pcp Per   Brief Narrative:  62 year old with a history of asthma, alcohol abuse, untreated uncontrolled hypertension who sustained a mechanical fall requiring surgical repair on 8/3 2019.   Assessment & Plan:   Principal Problem:   Closed left hip fracture, initial encounter [facility]) Active Problems:   Hypertension   Alcohol dependence with acute alcoholic intoxication ([place])   Hyponatremia   Asthma   Protein-calorie malnutrition, severe   Post-operative pain   Acute blood loss anemia  Left hip fracture status post intramedullary nailing 8/3 2019 - At this time patient is doing well postoperatively.  I have encouraged her to use incentive spirometry.  Pain control, bowel regimen PRN.  Physical therapy recommended CIR.  Spoke with Claiborne Billings from CIR this morning who is currently working on this  History of alcohol use -Alcohol withdrawal protocol in place.  Continue thiamine and folate.  Asthma -Bronchodilators as needed  Essential hypertension -Norvasc 5 mg daily.  DVT prophylaxis: Lovenox Code Status: Full code Family Communication: None at bedside Disposition Plan: Pending placement  Consultants:   Orthopedic  PMNR  Procedures:   Left intramedullary nailing of the hip 8/3  Antimicrobials:   None   Subjective: No complaints, no acute events overnight.  Review of Systems Otherwise negative except as per HPI, including: General: Denies fever, chills, night sweats or unintended weight loss. Resp: Denies cough, wheezing, shortness of breath. Cardiac: Denies chest pain, palpitations, orthopnea, paroxysmal nocturnal dyspnea. GI: Denies abdominal pain, nausea, vomiting, diarrhea or constipation GU: Denies dysuria, frequency, hesitancy or incontinence MS: Denies muscle aches, joint pain or swelling Neuro: Denies headache, neurologic deficits (focal weakness, numbness,  tingling), abnormal gait Psych: Denies anxiety, depression, SI/HI/AVH Skin: Denies new rashes or lesions ID: Denies sick contacts, exotic exposures, travel  Objective: Vitals:   03/19/18 2014 03/20/18 0618 03/20/18 1323 03/21/18 0448  BP: (!) 146/106 (!) 140/103 (!) 135/105 (!) 164/111  Pulse: 93 98 (!) 108 85  Resp:   16 14  Temp: 98.3 F (36.8 C) 98.8 F (37.1 C) 98.6 F (37 C)   TempSrc: Oral Oral Oral   SpO2: 98% 98% 96% 99%  Weight:      Height:        Intake/Output Summary (Last 24 hours) at 03/21/2018 1231 Last data filed at 03/21/2018 0900 Gross per 24 hour  Intake 240 ml  Output -  Net 240 ml   Filed Weights   03/18/18 0051 03/18/18 1046  Weight: 61.2 kg (135 lb) 53.3 kg (117 lb 8.1 oz)    Examination:  General exam: Appears calm and comfortable  Respiratory system: Clear to auscultation. Respiratory effort normal. Cardiovascular system: S1 & S2 heard, RRR. No JVD, murmurs, rubs, gallops or clicks. No pedal edema. Gastrointestinal system: Abdomen is nondistended, soft and nontender. No organomegaly or masses felt. Normal bowel sounds heard. Central nervous system: Alert and oriented. No focal neurological deficits. Extremities: Symmetric 4 x 5 power. Skin: No rashes, lesions or ulcers.  Left hip dressing in place Psychiatry: Overall poor judgment.    Data Reviewed:   CBC: Recent Labs  Lab 03/18/18 0213 03/19/18 0440 03/20/18 0520 03/21/18 0338  WBC 8.9 6.3 5.1 5.0  NEUTROABS 7.6  --   --   --   HGB 14.7 10.6* 9.4* 8.8*  HCT 44.2 32.8* 28.2* 27.1*  MCV 88.4 90.9 90.1 90.0  PLT 189 132* 116* 854*   Basic Metabolic Panel:  Recent Labs  Lab 03/18/18 0213 03/19/18 0440 03/20/18 0520 03/21/18 0338  NA 130* 131* 133* 133*  K 4.0 5.0 4.1 3.7  CL 94* 96* 97* 94*  CO2 24 28 29 31   GLUCOSE 111* 109* 90 96  BUN 5* 6* <5* 6*  CREATININE 0.71 0.68 0.65 0.64  CALCIUM 8.6* 8.5* 8.5* 8.5*  MG  --  1.8  --  1.9   GFR: Estimated Creatinine Clearance:  62.1 mL/min (by C-G formula based on SCr of 0.64 mg/dL). Liver Function Tests: Recent Labs  Lab 03/19/18 0440  AST 23  ALT 11  ALKPHOS 42  BILITOT 0.8  PROT 5.8*  ALBUMIN 3.1*   No results for input(s): LIPASE, AMYLASE in the last 168 hours. No results for input(s): AMMONIA in the last 168 hours. Coagulation Profile: Recent Labs  Lab 03/18/18 0213  INR 1.07   Cardiac Enzymes: No results for input(s): CKTOTAL, CKMB, CKMBINDEX, TROPONINI in the last 168 hours. BNP (last 3 results) No results for input(s): PROBNP in the last 8760 hours. HbA1C: No results for input(s): HGBA1C in the last 72 hours. CBG: No results for input(s): GLUCAP in the last 168 hours. Lipid Profile: No results for input(s): CHOL, HDL, LDLCALC, TRIG, CHOLHDL, LDLDIRECT in the last 72 hours. Thyroid Function Tests: No results for input(s): TSH, T4TOTAL, FREET4, T3FREE, THYROIDAB in the last 72 hours. Anemia Panel: No results for input(s): VITAMINB12, FOLATE, FERRITIN, TIBC, IRON, RETICCTPCT in the last 72 hours. Sepsis Labs: No results for input(s): PROCALCITON, LATICACIDVEN in the last 168 hours.  Recent Results (from the past 240 hour(s))  MRSA PCR Screening     Status: None   Collection Time: 03/18/18  6:50 AM  Result Value Ref Range Status   MRSA by PCR NEGATIVE NEGATIVE Final    Comment:        The GeneXpert MRSA Assay (FDA approved for NASAL specimens only), is one component of a comprehensive MRSA colonization surveillance program. It is not intended to diagnose MRSA infection nor to guide or monitor treatment for MRSA infections. Performed at [facility], [place] [address] [place], [place] [address]          Radiology Studies: No results found.      Scheduled Meds: . amLODipine  5 mg Oral Daily  . cholecalciferol  2,000 Units Oral Daily  . enoxaparin (LOVENOX) injection  40 mg Subcutaneous Q24H  . feeding supplement  237 mL Oral TID BM  . folic acid  1 mg Oral Daily    . LORazepam  0-4 mg Intravenous Q12H  . multivitamin with minerals  1 tablet Oral Daily  . nicotine  14 mg Transdermal Daily  . thiamine  100 mg Oral Daily   Or  . thiamine  100 mg Intravenous Daily   Continuous Infusions: . methocarbamol (ROBAXIN) IV       LOS: 3 days    I have spent 35 minutes face to face with the patient and on the ward discussing the patients care, assessment, plan and disposition with other care givers. >50% of the time was devoted counseling the patient about the risks and benefits of treatment and coordinating care.     Stevan Eberwein Arsenio Loader, MD [facility] Pager [phone]   If 7PM-7AM, please contact night-coverage [email or web address] Password [email or web address] 03/21/2018, 12:31 PM

## 2018-03-22 DIAGNOSIS — S72142A Displaced intertrochanteric fracture of left femur, initial encounter for closed fracture: Principal | ICD-10-CM

## 2018-03-22 MED ORDER — HYDROCODONE-ACETAMINOPHEN 5-325 MG PO TABS: 1.0000 | ORAL_TABLET | ORAL | 0 refills | Status: DC | PRN

## 2018-03-22 MED ORDER — ADULT MULTIVITAMIN W/MINERALS CH: 1.0000 | ORAL_TABLET | Freq: Every day | ORAL | 0 refills | Status: DC

## 2018-03-22 MED ORDER — SENNOSIDES-DOCUSATE SODIUM 8.6-50 MG PO TABS: 1.0000 | ORAL_TABLET | Freq: Two times a day (BID) | ORAL | 0 refills | Status: DC

## 2018-03-22 MED ORDER — AMLODIPINE BESYLATE 5 MG PO TABS: 5.0000 mg | ORAL_TABLET | Freq: Every day | ORAL | 0 refills | Status: DC

## 2018-03-22 MED ORDER — CHOLECALCIFEROL 50 MCG (2000 UT) PO TABS: 2000.0000 [IU] | ORAL_TABLET | Freq: Every day | ORAL | 1 refills | Status: DC

## 2018-03-22 MED ORDER — METHOCARBAMOL 500 MG PO TABS: 500.0000 mg | ORAL_TABLET | Freq: Four times a day (QID) | ORAL | 0 refills | Status: DC | PRN

## 2018-03-22 MED ORDER — THIAMINE HCL 100 MG PO TABS: 100.0000 mg | ORAL_TABLET | Freq: Every day | ORAL | 0 refills | Status: DC

## 2018-03-22 MED ORDER — NICOTINE 14 MG/24HR TD PT24: 14.0000 mg | MEDICATED_PATCH | Freq: Every day | TRANSDERMAL | 0 refills | Status: DC

## 2018-03-22 MED ORDER — ASPIRIN EC 325 MG PO TBEC: 325.0000 mg | DELAYED_RELEASE_TABLET | Freq: Two times a day (BID) | ORAL | 0 refills | Status: AC

## 2018-03-22 NOTE — Progress Notes (Signed)
Physical Therapy Treatment Patient Details Name: Veronica Stewart MRN: [id number] DOB: [birth year] Today's Date: 03/22/2018    History of Present Illness Pt is a 62 y.o. F with significant PMH of asthma, hypertension, alcohol use with displaced left intertrochanteric femur fracture s/p cephalomedullary nailing.    PT Comments    Pt performed supine HEP and reviewed visually seated and standing activities.  Pt slow and guarded due to pain and reports she is fatigued from walking to the bathroom.  Plan next session for continued functional mobility before return home.  Pt's plan is to return home with support from her aunt.  She denies need for stair training to enter home.    Follow Up Recommendations  Home health PT;Supervision for mobility/OOB(charity care/ no insurance)     Equipment Recommendations  Rolling walker with 5" wheels;3in1 (PT)    Recommendations for Other Services       Precautions / Restrictions Precautions Precautions: Fall Restrictions Weight Bearing Restrictions: Yes LLE Weight Bearing: Touchdown weight bearing    Mobility  Bed Mobility Overal bed mobility: Needs Assistance Bed Mobility: Supine to Sit;Sit to Supine     Supine to sit: Supervision     General bed mobility comments: Pt lying in supine on arrival reports she has just been up and wished to rest at this time, however she was agreeable to supine exercises and review of HEP for home use.   Transfers Overall transfer level: Needs assistance Equipment used: Rolling walker (2 wheeled) Transfers: Sit to/from Stand Sit to Stand: Min guard         General transfer comment: cueing for safety, hand placement and technique to maintain TDWB L LE  Ambulation/Gait                 Stairs             Wheelchair Mobility    Modified Rankin (Stroke Patients Only)       Balance Overall balance assessment: Needs assistance Sitting-balance support: No upper extremity supported;Feet  supported Sitting balance-Leahy Scale: Good     Standing balance support: No upper extremity supported;During functional activity Standing balance-Leahy Scale: Poor                              Cognition Arousal/Alertness: Lethargic Behavior During Therapy: WFL for tasks assessed/performed Overall Cognitive Status: Within Functional Limits for tasks assessed Area of Impairment: Safety/judgement;Awareness                         Safety/Judgement: Decreased awareness of safety     General Comments: likely close to baseline, but continues to require cueing to adhere to TDWB during ADL and mobility activities      Exercises Total Joint Exercises Ankle Circles/Pumps: AROM;Both;20 reps;Supine Quad Sets: AROM;Left;10 reps;Supine Short Arc Quad: AROM;Left;10 reps;Supine Heel Slides: AROM;Left;10 reps;Supine Hip ABduction/ADduction: AROM;Left;10 reps;Supine Long Arc Quad: (reviewed technique.  ) Knee Flexion: (reviewed standing technique) Marching in Standing: (reviewed standing technique) Standing Hip Extension: (reviewed standing technique.  )    General Comments        Pertinent Vitals/Pain Pain Assessment: 0-10 Pain Score: 6  Pain Location: left hip Pain Descriptors / Indicators: Grimacing;Guarding;Sore Pain Intervention(s): Monitored during session;Repositioned    Home Living                      Prior Function  PT Goals (current goals can now be found in the care plan section) Acute Rehab PT Goals Patient Stated Goal: to get home Potential to Achieve Goals: Good Progress towards PT goals: Progressing toward goals    Frequency    Min 4X/week      PT Plan Current plan remains appropriate    Co-evaluation              AM-PAC PT "6 Clicks" Daily Activity  Outcome Measure  Difficulty turning over in bed (including adjusting bedclothes, sheets and blankets)?: A Little Difficulty moving from lying on back to  sitting on the side of the bed? : A Lot Difficulty sitting down on and standing up from a chair with arms (e.g., wheelchair, bedside commode, etc,.)?: Unable Help needed moving to and from a bed to chair (including a wheelchair)?: A Little Help needed walking in hospital room?: A Little Help needed climbing 3-5 steps with a railing? : A Lot 6 Click Score: 14    End of Session Equipment Utilized During Treatment: Gait belt Activity Tolerance: Patient tolerated treatment well Patient left: in bed;with call bell/phone within reach;with bed alarm set Nurse Communication: Mobility status PT Visit Diagnosis: Other abnormalities of gait and mobility (R26.89);Unsteadiness on feet (R26.81);History of falling (Z91.81);Difficulty in walking, not elsewhere classified (R26.2);Pain Pain - Right/Left: Left Pain - part of body: Hip     Time: 1343-1401 PT Time Calculation (min) (ACUTE ONLY): 18 min  Charges:  $Therapeutic Exercise: 8-22 mins                     Governor Rooks, PTA pager [phone]    Cristela Blue 03/22/2018, 2:06 PM

## 2018-03-22 NOTE — Discharge Summary (Signed)
[facility]  Physician Discharge Summary   Patient ID: Veronica Stewart MRN: [id number] DOB/AGE: April 12, 1956 62 y.o.  Admit date: 03/18/2018 Discharge date: 03/22/2018  PCP: Patient, No Pcp Per  DISCHARGE DIAGNOSES:  Left hip fracture status post ORIF History of alcohol abuse  RECOMMENDATIONS FOR OUTPATIENT FOLLOW UP: 1. Outpatient follow-up with orthopedics 2. Home health has been ordered   DISCHARGE CONDITION: fair  Diet recommendation: Low-sodium  Filed Weights   03/18/18 0051 03/18/18 1046  Weight: 61.2 kg (135 lb) 53.3 kg (117 lb 8.1 oz)    INITIAL HISTORY: 62 year old with a history of asthma, alcohol abuse, untreated uncontrolled hypertension who sustained a mechanical fall requiring surgical repair on 8/3 2019.  Consultations:  Orthopedics  Procedures:  ORIF    HOSPITAL COURSE:   Left hip fracture status post intramedullary nailing 03/18/2018 Patient was seen by orthopedics.  Patient underwent surgery which was uneventful.  Pain medications.  Bowel regimen.  DVT prophylaxis with aspirin as recommended by orthopedics.  Initially CIR was evaluating patient however now the plan is for the patient to go home with home health.  History of alcohol use Patient did not have any withdrawal symptoms in the hospital.  He has been counseled regarding alcohol use.  Asthma This issue was stable in the hospital  Essential hypertension Amlodipine daily.  Overall stable.  Okay for discharge today.   PERTINENT LABS:  The results of significant diagnostics from this hospitalization (including imaging, microbiology, ancillary and laboratory) are listed below for reference.    Microbiology: Recent Results (from the past 240 hour(s))  MRSA PCR Screening     Status: None   Collection Time: 03/18/18  6:50 AM  Result Value Ref Range Status   MRSA by PCR NEGATIVE NEGATIVE Final    Comment:        The GeneXpert MRSA Assay (FDA approved for NASAL  specimens only), is one component of a comprehensive MRSA colonization surveillance program. It is not intended to diagnose MRSA infection nor to guide or monitor treatment for MRSA infections. Performed at [facility], [place] [address], [place], [place] [address]      Labs: Basic Metabolic Panel: Recent Labs  Lab 03/18/18 [phone] 03/19/18 0440 03/20/18 0520 03/21/18 0338  NA 130* 131* 133* 133*  K 4.0 5.0 4.1 3.7  CL 94* 96* 97* 94*  CO2 24 28 29 31   GLUCOSE 111* 109* 90 96  BUN 5* 6* <5* 6*  CREATININE 0.71 0.68 0.65 0.64  CALCIUM 8.6* 8.5* 8.5* 8.5*  MG  --  1.8  --  1.9   Liver Function Tests: Recent Labs  Lab 03/19/18 0440  AST 23  ALT 11  ALKPHOS 42  BILITOT 0.8  PROT 5.8*  ALBUMIN 3.1*   CBC: Recent Labs  Lab 03/18/18 0213 03/19/18 0440 03/20/18 0520 03/21/18 0338  WBC 8.9 6.3 5.1 5.0  NEUTROABS 7.6  --   --   --   HGB 14.7 10.6* 9.4* 8.8*  HCT 44.2 32.8* 28.2* 27.1*  MCV 88.4 90.9 90.1 90.0  PLT 189 132* 116* 135*     IMAGING STUDIES Dg Chest 1 View  Result Date: 03/18/2018 CLINICAL DATA:  Hip pain. EXAM: CHEST  1 VIEW COMPARISON:  Chest radiograph Jan 04, 2004 FINDINGS: Cardiomediastinal silhouette is normal. Calcified aortic arch. No pleural effusions or focal consolidations. Hyperinflation. Large bulla LEFT upper lung zone. Trachea projects midline and there is no pneumothorax. Soft tissue planes and included osseous structures are non-suspicious. Osteopenia. IMPRESSION: Hyperinflation  without focal consolidation. Aortic Atherosclerosis (ICD10-I70.0). Electronically Signed   By: Elon Alas M.D.   On: 03/18/2018 01:52   Dg C-arm 1-60 Min  Result Date: 03/18/2018 CLINICAL DATA:  Left intratrochanteric intramedullary rod EXAM: DG C-ARM 61-120 MIN; OPERATIVE LEFT HIP WITH PELVIS FLUOROSCOPY TIME:  166 seconds COMPARISON:  Left hip radiographs-earlier same day FINDINGS: Five spot intraoperative fluoroscopic images of the right hip are  provided for review. Images demonstrate the sequela intramedullary rod fixation of the left femur and dynamic screw fixation of the left femoral neck. The distal end of the femoral intramedullary rod is transfixed with a single cancellous screw. Alignment appears markedly improved. Scattered foci of subcutaneous emphysema about the operative site. No radiopaque foreign body. IMPRESSION: Post ORIF of left intertrochanteric femur fracture as above. Electronically Signed   By: Sandi Mariscal M.D.   On: 03/18/2018 13:27   Dg Hip Operative Unilat With Pelvis Left  Result Date: 03/18/2018 CLINICAL DATA:  Left intratrochanteric intramedullary rod EXAM: DG C-ARM 61-120 MIN; OPERATIVE LEFT HIP WITH PELVIS FLUOROSCOPY TIME:  166 seconds COMPARISON:  Left hip radiographs-earlier same day FINDINGS: Five spot intraoperative fluoroscopic images of the right hip are provided for review. Images demonstrate the sequela intramedullary rod fixation of the left femur and dynamic screw fixation of the left femoral neck. The distal end of the femoral intramedullary rod is transfixed with a single cancellous screw. Alignment appears markedly improved. Scattered foci of subcutaneous emphysema about the operative site. No radiopaque foreign body. IMPRESSION: Post ORIF of left intertrochanteric femur fracture as above. Electronically Signed   By: Sandi Mariscal M.D.   On: 03/18/2018 13:27   Dg Hip Unilat With Pelvis 2-3 Views Left  Result Date: 03/18/2018 CLINICAL DATA:  Left hip pain and deformity after fall. EXAM: DG HIP (WITH OR WITHOUT PELVIS) 2-3V LEFT COMPARISON:  None. FINDINGS: Comminuted and displaced intertrochanteric left proximal femur fracture with proximal migration of the femoral shaft. Displacement involves the both lesser and greater trochanters. The femoral head remains seated. Pubic rami are intact. No additional pelvic fracture. Pelvic calcifications likely fibroids. There are also vascular calcifications. IMPRESSION:  Comminuted displaced intertrochanteric left proximal femur fracture with proximal migration of the femoral shaft. Electronically Signed   By: Jeb Levering M.D.   On: 03/18/2018 01:40   Dg Femur Port Min 2 Views Left  Result Date: 03/18/2018 CLINICAL DATA:  Post op. Left intratrochanteric intramedullary rod EXAM: LEFT FEMUR PORTABLE 2 VIEWS COMPARISON:  03/18/2018 FINDINGS: Status post intramedullary nail and lag screw fixation of an intertrochanteric fracture of the LEFT hip. There is no dislocation or interval fracture. Expected postoperative soft tissue gas noted. IMPRESSION: Status post ORIF of the LEFT hip. Electronically Signed   By: Nolon Nations M.D.   On: 03/18/2018 14:12    DISCHARGE EXAMINATION: Vitals:   03/21/18 1416 03/21/18 2023 03/22/18 0403 03/22/18 1348  BP: (!) 133/96 (!) 156/104 (!) 165/109 (!) 117/97  Pulse: 89 91 83 87  Resp: 16 16 14    Temp: 98.7 F (37.1 C) 98.1 F (36.7 C) 98.7 F (37.1 C) 98.4 F (36.9 C)  TempSrc: Oral  Oral Oral  SpO2: 100% 99% 99% 100%  Weight:      Height:       General appearance: alert, cooperative, appears stated age and no distress Resp: clear to auscultation bilaterally Cardio: regular rate and rhythm, S1, S2 normal, no murmur, click, rub or gallop GI: soft, non-tender; bowel sounds normal; no masses,  no organomegaly Some  bruising noted over left lateral thigh.  DISPOSITION: Home with home health  Discharge Instructions    Call MD for:  extreme fatigue   Complete by:  As directed    Call MD for:  persistant dizziness or light-headedness   Complete by:  As directed    Call MD for:  persistant nausea and vomiting   Complete by:  As directed    Call MD for:  severe uncontrolled pain   Complete by:  As directed    Call MD for:  temperature >100.4   Complete by:  As directed    Diet - low sodium heart healthy   Complete by:  As directed    Discharge instructions   Complete by:  As directed    Please follow-up with  orthopedic surgeon in 2 weeks.  You were cared for by a hospitalist during your hospital stay. If you have any questions about your discharge medications or the care you received while you were in the hospital after you are discharged, you can call the unit and asked to speak with the hospitalist on call if the hospitalist that took care of you is not available. Once you are discharged, your primary care physician will handle any further medical issues. Please note that NO REFILLS for any discharge medications will be authorized once you are discharged, as it is imperative that you return to your primary care physician (or establish a relationship with a primary care physician if you do not have one) for your aftercare needs so that they can reassess your need for medications and monitor your lab values. If you do not have a primary care physician, you can call [phone] for a physician referral.   Increase activity slowly   Complete by:  As directed         Allergies as of 03/22/2018   No Known Allergies     Medication List    STOP taking these medications   aspirin 325 MG tablet Replaced by:  aspirin EC 325 MG tablet     TAKE these medications   amLODipine 5 MG tablet Commonly known as:  NORVASC Take 1 tablet (5 mg total) by mouth daily.   aspirin EC 325 MG tablet Take 1 tablet (325 mg total) by mouth 2 (two) times daily for 15 days. Replaces:  aspirin 325 MG tablet   Cholecalciferol 2000 units Tabs Take 1 tablet (2,000 Units total) by mouth daily.   HYDROcodone-acetaminophen 5-325 MG tablet Commonly known as:  NORCO/VICODIN Take 1 tablet by mouth every 4 (four) hours as needed for moderate pain.   methocarbamol 500 MG tablet Commonly known as:  ROBAXIN Take 1 tablet (500 mg total) by mouth every 6 (six) hours as needed for muscle spasms.   multivitamin with minerals Tabs tablet Take 1 tablet by mouth daily.   nicotine 14 mg/24hr patch Commonly known as:  NICODERM CQ -  dosed in mg/24 hours Place 1 patch (14 mg total) onto the skin daily.   senna-docusate 8.6-50 MG tablet Commonly known as:  Senokot-S Take 1 tablet by mouth 2 (two) times daily.   thiamine 100 MG tablet Take 1 tablet (100 mg total) by mouth daily.            Durable Medical Equipment  (From admission, onward)        Start     Ordered   03/22/18 1018  For home use only DME Walker rolling  Once    Question:  Patient needs  a walker to treat with the following condition  Answer:  Hip fracture ([place])   03/22/18 1018   03/22/18 1018  For home use only DME 3 n 1  Once     03/22/18 1018       Follow-up Information    Haddix, Thomasene Lot, MD. Schedule an appointment as soon as possible for a visit in 2 week(s).   Specialty:  Orthopedic Surgery Contact information: [address] [place] [address] [phone]           TOTAL DISCHARGE TIME: 35 minutes  [facility] Pager [phone]  03/22/2018, 3:57 PM

## 2018-03-22 NOTE — Progress Notes (Signed)
Occupational Therapy Treatment Patient Details Name: Veronica Stewart MRN: [id number] DOB: 01-Oct-1955 Today's Date: 03/22/2018    History of present illness Pt is a 62 y.o. F with significant PMH of asthma, hypertension, alcohol use with displaced left intertrochanteric femur fracture s/p cephalomedullary nailing.   OT comments  Pt making good progress with functional goals. OT will continue to follow  Follow Up Recommendations  Home health OT;Supervision/Assistance - 24 hour    Equipment Recommendations  3 in 1 bedside commode    Recommendations for Other Services      Precautions / Restrictions Precautions Precautions: Fall Restrictions Weight Bearing Restrictions: Yes LLE Weight Bearing: Touchdown weight bearing       Mobility Bed Mobility Overal bed mobility: Needs Assistance Bed Mobility: Supine to Sit;Sit to Supine     Supine to sit: Supervision        Transfers Overall transfer level: Needs assistance Equipment used: Rolling walker (2 wheeled) Transfers: Sit to/from Stand Sit to Stand: Min guard         General transfer comment: cueing for safety, hand placement and technique to maintain TDWB L LE    Balance Overall balance assessment: Needs assistance Sitting-balance support: No upper extremity supported;Feet supported Sitting balance-Leahy Scale: Good     Standing balance support: No upper extremity supported;During functional activity Standing balance-Leahy Scale: Poor                             ADL either performed or assessed with clinical judgement   ADL Overall ADL's : Needs assistance/impaired                                             Vision       Perception     Praxis      Cognition Arousal/Alertness: Awake/alert Behavior During Therapy: WFL for tasks assessed/performed Overall Cognitive Status: Impaired/Different from baseline Area of Impairment: Safety/judgement;Awareness                         Safety/Judgement: Decreased awareness of safety     General Comments: likely close to baseline, but continues to require cueing to adhere to TDWB during ADL and mobility activities        Exercises     Shoulder Instructions       General Comments      Pertinent Vitals/ Pain       Pain Assessment: 0-10 Pain Score: 6  Pain Location: left hip Pain Descriptors / Indicators: Grimacing;Guarding;Sore Pain Intervention(s): Limited activity within patient's tolerance;Monitored during session;Premedicated before session;Repositioned  Home Living                                          Prior Functioning/Environment              Frequency  Min 3X/week        Progress Toward Goals  OT Goals(current goals can now be found in the care plan section)  Progress towards OT goals: Progressing toward goals     Plan Frequency remains appropriate;Discharge plan needs to be updated    Co-evaluation                 AM-PAC  PT "6 Clicks" Daily Activity     Outcome Measure   Help from another person eating meals?: None Help from another person taking care of personal grooming?: None Help from another person toileting, which includes using toliet, bedpan, or urinal?: A Little Help from another person bathing (including washing, rinsing, drying)?: A Little Help from another person to put on and taking off regular upper body clothing?: None Help from another person to put on and taking off regular lower body clothing?: A Little 6 Click Score: 21    End of Session Equipment Utilized During Treatment: Gait belt;Rolling walker  OT Visit Diagnosis: Unsteadiness on feet (R26.81);Other abnormalities of gait and mobility (R26.89);Muscle weakness (generalized) (M62.81);Pain;Other symptoms and signs involving cognitive function Pain - Right/Left: Left Pain - part of body: Hip;Leg   Activity Tolerance Patient tolerated treatment well   Patient  Left in bed;with call bell/phone within reach   Nurse Communication Mobility status;Precautions;Weight bearing status    Functional Assessment Tool Used: AM-PAC 6 Clicks Daily Activity   Time: 9292-4462 OT Time Calculation (min): 19 min  Charges: OT General Charges $OT Visit: 1 Visit OT Treatments $Self Care/Home Management : 8-22 mins     Emmit Alexanders [facility] 03/22/2018, 11:27 AM

## 2018-03-22 NOTE — Care Management Note (Signed)
Case Management Note  Patient Details  Name: Veronica Stewart MRN: [id number] Date of Birth: 01/26/1956  Subjective/Objective:   62 yr old female s/p IM nailing of right hip fracture.                 Action/Plan: Case manager spoke with patient concerning discharge plan and DME. Case manager called referral for [place] therapy to Neoma Laming, [place] Liaison. Patient is uninsured. She will have family support at discharge.  Expected Discharge Date:  03/22/18               Expected Discharge Plan:  [place]  In-House Referral:     Discharge planning Services  CM Consult  Post Acute Care Choice:  Home Health, Durable Medical Equipment Choice offered to:  Patient  DME Arranged:  3-N-1, Walker rolling DME Agency:  [place]:  PT, OT [facility] Agency:  [place]  Status of Service:  Completed, signed off  If discussed at [place] of Stay Meetings, dates discussed:    Additional Comments:  Ninfa Meeker, RN 03/22/2018, 1:25 PM

## 2018-03-22 NOTE — Progress Notes (Signed)
Pt discharged to home in stable condition.  All discharge instructions and Rx's x 8 reviewed with and given to patient. Pt transported via wheelchair with belongings and transporters X 2 at chairside.  [id number]

## 2019-03-15 ENCOUNTER — Ambulatory Visit (INDEPENDENT_AMBULATORY_CARE_PROVIDER_SITE_OTHER): Payer: Medicaid Other | Admitting: Primary Care

## 2019-03-15 ENCOUNTER — Other Ambulatory Visit: Payer: Self-pay

## 2019-03-15 ENCOUNTER — Encounter (INDEPENDENT_AMBULATORY_CARE_PROVIDER_SITE_OTHER): Payer: Self-pay | Admitting: Primary Care

## 2019-03-15 VITALS — BP 160/100 | HR 62 | Ht 70.0 in | Wt 112.6 lb

## 2019-03-15 DIAGNOSIS — R2681 Unsteadiness on feet: Secondary | ICD-10-CM | POA: Diagnosis not present

## 2019-03-15 DIAGNOSIS — Z681 Body mass index (BMI) 19 or less, adult: Secondary | ICD-10-CM

## 2019-03-15 DIAGNOSIS — I10 Essential (primary) hypertension: Secondary | ICD-10-CM | POA: Diagnosis not present

## 2019-03-15 DIAGNOSIS — H6123 Impacted cerumen, bilateral: Secondary | ICD-10-CM | POA: Diagnosis not present

## 2019-03-15 DIAGNOSIS — F17210 Nicotine dependence, cigarettes, uncomplicated: Secondary | ICD-10-CM

## 2019-03-15 DIAGNOSIS — Z7689 Persons encountering health services in other specified circumstances: Secondary | ICD-10-CM

## 2019-03-15 MED ORDER — CLONIDINE HCL 0.1 MG PO TABS
0.2000 mg | ORAL_TABLET | Freq: Once | ORAL | Status: AC
  Administered 2019-03-15: 0.2 mg via ORAL

## 2019-03-15 MED ORDER — ATENOLOL 50 MG PO TABS: 50.0000 mg | ORAL_TABLET | Freq: Every day | ORAL | 3 refills | Status: DC

## 2019-03-15 MED ORDER — AMLODIPINE BESYLATE 10 MG PO TABS: 10.0000 mg | ORAL_TABLET | Freq: Every day | ORAL | 3 refills | Status: DC

## 2019-03-15 MED ORDER — ALBUTEROL SULFATE HFA 108 (90 BASE) MCG/ACT IN AERS: 2.0000 | INHALATION_SPRAY | Freq: Four times a day (QID) | RESPIRATORY_TRACT | 1 refills | Status: DC | PRN

## 2019-03-15 MED ORDER — LISINOPRIL-HYDROCHLOROTHIAZIDE 20-25 MG PO TABS: 1.0000 | ORAL_TABLET | Freq: Every day | ORAL | 3 refills | Status: DC

## 2019-03-15 NOTE — Patient Instructions (Signed)

## 2019-03-15 NOTE — Progress Notes (Addendum)
New Patient Office Visit  Subjective:  Patient ID: Veronica Stewart, female    DOB: 07/16/1956  Age: 63 y.o. MRN: [id number]  CC:  Chief Complaint  Patient presents with  . New Patient (Initial Visit)    HTN/asthma   . Medication Refill    inhaler and antihypertensives    HPI Veronica Stewart presents for establishment of care she has not seen or followed by a primary care over 10 years. She presents today with elevated blood pressure. Asymptomatic other than dizziness at times. Treated in clinic with clonidine .2mg   Little to no affected. Consulted with cardiology and advised triple therapy originally ordered norvasc 10 and HCTZ 25 advised to add atenolol 50 mg daily.    Past Medical History:  Diagnosis Date  . Asthma   . Hypertension     Past Surgical History:  Procedure Laterality Date  . INTRAMEDULLARY (IM) NAIL INTERTROCHANTERIC Left 03/18/2018   Procedure: INTRAMEDULLARY (IM) NAIL INTERTROCHANTRIC;  Surgeon: Shona Needles, MD;  Location: [facility];  Service: Orthopedics;  Laterality: Left;    Family History  Problem Relation Age of Onset  . Sudden Cardiac Death Neg Hx     Social History   Socioeconomic History  . Marital status: Legally Separated    Spouse name: Not on file  . Number of children: Not on file  . Years of education: Not on file  . Highest education level: Not on file  Occupational History  . Not on file  Social Needs  . Financial resource strain: Not on file  . Food insecurity    Worry: Not on file    Inability: Not on file  . Transportation needs    Medical: Not on file    Non-medical: Not on file  Tobacco Use  . Smoking status: Current Every Day Smoker    Packs/day: 0.50    Types: Cigarettes  . Smokeless tobacco: Never Used  Substance and Sexual Activity  . Alcohol use: Yes    Comment: two 40 oz beers most days   . Drug use: Yes    Types: Marijuana  . Sexual activity: Not on file  Lifestyle  . Physical activity    Days per week: Not  on file    Minutes per session: Not on file  . Stress: Not on file  Relationships  . Social Herbalist on phone: Not on file    Gets together: Not on file    Attends religious service: Not on file    Active member of club or organization: Not on file    Attends meetings of clubs or organizations: Not on file    Relationship status: Not on file  . Intimate partner violence    Fear of current or ex partner: Not on file    Emotionally abused: Not on file    Physically abused: Not on file    Forced sexual activity: Not on file  Other Topics Concern  . Not on file  Social History Narrative  . Not on file    ROS Review of Systems  HENT: Positive for hearing loss.   Gastrointestinal: Positive for diarrhea.       Resolved for 2 days  Musculoskeletal: Positive for gait problem.  Neurological: Positive for dizziness, light-headedness and headaches.  All other systems reviewed and are negative.   Objective:   Today's Vitals: BP (!) 171/122 (BP Location: Left Arm, Patient Position: Sitting, Cuff Size: Small)   Pulse 60   Ht  5\' 10"  (1.778 m)   Wt 112 lb 9.6 oz (51.1 kg)   SpO2 98%   BMI 16.16 kg/m   Physical Exam Constitutional:      Comments: Thin frail built  HENT:     Head: Normocephalic.     Right Ear: There is impacted cerumen.     Left Ear: There is impacted cerumen.     Nose: Nose normal.  Eyes:     Extraocular Movements: Extraocular movements intact.     Pupils: Pupils are equal, round, and reactive to light.  Neck:     Musculoskeletal: Normal range of motion.  Cardiovascular:     Rate and Rhythm: Normal rate and regular rhythm.  Pulmonary:     Effort: Pulmonary effort is normal.     Breath sounds: Wheezing present.  Abdominal:     General: Abdomen is flat. Bowel sounds are normal.     Palpations: Abdomen is soft.  Musculoskeletal: Normal range of motion.  Skin:    General: Skin is warm and dry.  Neurological:     General: No focal deficit  present.     Mental Status: She is alert and oriented to person, place, and time.     Gait: Gait abnormal.  Psychiatric:        Mood and Affect: Mood normal.     Assessment & Plan:   Problem List Items Addressed This Visit    Hypertension   Relevant Medications   amLODipine (NORVASC) 10 MG tablet   lisinopril-hydrochlorothiazide (ZESTORETIC) 20-25 MG tablet    Other Visit Diagnoses    Encounter to establish care    -  Primary   Body mass index (BMI) less than 16.5       Unstable gait          Outpatient Encounter Medications as of 03/15/2019  Medication Sig  . cholecalciferol 2000 units TABS Take 1 tablet (2,000 Units total) by mouth daily.  Marland Kitchen amLODipine (NORVASC) 10 MG tablet Take 1 tablet (10 mg total) by mouth daily.  Marland Kitchen lisinopril-hydrochlorothiazide (ZESTORETIC) 20-25 MG tablet Take 1 tablet by mouth daily.  Marland Kitchen thiamine 100 MG tablet Take 1 tablet (100 mg total) by mouth daily. (Patient not taking: Reported on 03/15/2019)  . [DISCONTINUED] amLODipine (NORVASC) 5 MG tablet Take 1 tablet (5 mg total) by mouth daily. (Patient not taking: Reported on 03/15/2019)  . [DISCONTINUED] HYDROcodone-acetaminophen (NORCO/VICODIN) 5-325 MG tablet Take 1 tablet by mouth every 4 (four) hours as needed for moderate pain.  . [DISCONTINUED] methocarbamol (ROBAXIN) 500 MG tablet Take 1 tablet (500 mg total) by mouth every 6 (six) hours as needed for muscle spasms.  . [DISCONTINUED] Multiple Vitamin (MULTIVITAMIN WITH MINERALS) TABS tablet Take 1 tablet by mouth daily.  . [DISCONTINUED] nicotine (NICODERM CQ - DOSED IN MG/24 HOURS) 14 mg/24hr patch Place 1 patch (14 mg total) onto the skin daily.  . [DISCONTINUED] senna-docusate (SENOKOT-S) 8.6-50 MG tablet Take 1 tablet by mouth 2 (two) times daily.   No facility-administered encounter medications on file as of 03/15/2019.   [name] was seen today for new patient (initial visit) and medication refill.  Diagnoses and all orders for this  visit:  Encounter to establish care Patient is being seen to establish care no PCP on file previously seen in ED -admission 2019. To manage chronic diseases.  Hypertension, unspecified type Counseled on blood pressure goal of less than 130/80, low-sodium, DASH diet, medication compliance, 150 minutes of moderate intensity exercise per week. Discussed medication compliance, adverse  effects. Prescribed amlodipine and HCTZ   Body mass index (BMI) less than 16.5 No labs to review for protein malnourishment    Unstable gait History of left hip fracture. History of falls uses a cain and  she tripped while walking home  Other orders -     amLODipine (NORVASC) 10 MG tablet; Take 1 tablet (10 mg total) by mouth daily. -     lisinopril-hydrochlorothiazide (ZESTORETIC) 20-25 MG tablet; Take 1 tablet by mouth daily.    Follow-up: Return in about 2 weeks (around 03/29/2019) for Bp check .   Kerin Perna, NP

## 2019-03-26 ENCOUNTER — Encounter (INDEPENDENT_AMBULATORY_CARE_PROVIDER_SITE_OTHER): Payer: Self-pay | Admitting: Primary Care

## 2019-03-26 ENCOUNTER — Other Ambulatory Visit: Payer: Self-pay

## 2019-03-26 ENCOUNTER — Ambulatory Visit (INDEPENDENT_AMBULATORY_CARE_PROVIDER_SITE_OTHER): Payer: Medicaid Other | Admitting: Primary Care

## 2019-03-26 VITALS — BP 110/78 | HR 69 | Temp 97.5°F | Ht 70.0 in | Wt 114.8 lb

## 2019-03-26 DIAGNOSIS — R269 Unspecified abnormalities of gait and mobility: Secondary | ICD-10-CM | POA: Diagnosis not present

## 2019-03-26 DIAGNOSIS — Z1211 Encounter for screening for malignant neoplasm of colon: Secondary | ICD-10-CM

## 2019-03-26 DIAGNOSIS — I10 Essential (primary) hypertension: Secondary | ICD-10-CM

## 2019-03-26 DIAGNOSIS — Z833 Family history of diabetes mellitus: Secondary | ICD-10-CM

## 2019-03-26 DIAGNOSIS — Z23 Encounter for immunization: Secondary | ICD-10-CM

## 2019-03-26 NOTE — Progress Notes (Signed)
Established Patient Office Visit  Subjective:  Patient ID: Veronica Stewart, female    DOB: [birth year]  Age: 63 y.o. MRN: [id number]  CC:  Chief Complaint  Patient presents with  . Follow-up    BP check     HPI Veronica Stewart presents for blood pressure re-evaluation after starting blood pressure medication atenolol, amlodipine and  Lisinopril/HCTZ- She denies shortness of breath, headaches, chest pain or lower extremity edema. Blood pressure is EXCELLENT  Past Medical History:  Diagnosis Date  . Asthma   . Hypertension     Past Surgical History:  Procedure Laterality Date  . INTRAMEDULLARY (IM) NAIL INTERTROCHANTERIC Left 03/18/2018   Procedure: INTRAMEDULLARY (IM) NAIL INTERTROCHANTRIC;  Surgeon: Veronica Needles, MD;  Location: [place];  Service: Orthopedics;  Laterality: Left;    Family History  Problem Relation Age of Onset  . Sudden Cardiac Death Neg Hx     Social History   Socioeconomic History  . Marital status: Legally Separated    Spouse name: Not on file  . Number of children: Not on file  . Years of education: Not on file  . Highest education level: Not on file  Occupational History  . Not on file  Social Needs  . Financial resource strain: Not on file  . Food insecurity    Worry: Not on file    Inability: Not on file  . Transportation needs    Medical: Not on file    Non-medical: Not on file  Tobacco Use  . Smoking status: Current Every Day Smoker    Packs/day: 0.50    Types: Cigarettes  . Smokeless tobacco: Never Used  Substance and Sexual Activity  . Alcohol use: Yes    Comment: two 40 oz beers most days   . Drug use: Yes    Types: Marijuana  . Sexual activity: Not on file  Lifestyle  . Physical activity    Days per week: Not on file    Minutes per session: Not on file  . Stress: Not on file  Relationships  . Social Herbalist on phone: Not on file    Gets together: Not on file    Attends religious service: Not on file     Active member of club or organization: Not on file    Attends meetings of clubs or organizations: Not on file    Relationship status: Not on file  . Intimate partner violence    Fear of current or ex partner: Not on file    Emotionally abused: Not on file    Physically abused: Not on file    Forced sexual activity: Not on file  Other Topics Concern  . Not on file  Social History Narrative  . Not on file    Outpatient Medications Prior to Visit  Medication Sig Dispense Refill  . albuterol (VENTOLIN HFA) 108 (90 Base) MCG/ACT inhaler Inhale 2 puffs into the lungs every 6 (six) hours as needed for wheezing or shortness of breath. 6.7 g 1  . amLODipine (NORVASC) 10 MG tablet Take 1 tablet (10 mg total) by mouth daily. 30 tablet 3  . atenolol (TENORMIN) 50 MG tablet Take 1 tablet (50 mg total) by mouth daily. 30 tablet 3  . cholecalciferol 2000 units TABS Take 1 tablet (2,000 Units total) by mouth daily. 30 tablet 1  . lisinopril-hydrochlorothiazide (ZESTORETIC) 20-25 MG tablet Take 1 tablet by mouth daily. 30 tablet 3  . thiamine 100 MG tablet Take 1  tablet (100 mg total) by mouth daily. (Patient not taking: Reported on 03/15/2019) 30 tablet 0   No facility-administered medications prior to visit.     No Known Allergies  ROS Review of Systems  Musculoskeletal:       Uses a cane for balance      Objective:    Physical Exam  Constitutional: She is oriented to person, place, and time.  Thin frail built   HENT:  Head: Normocephalic.  Neck: Normal range of motion.  Abdominal: Soft. Bowel sounds are normal.  Musculoskeletal: Normal range of motion.  Neurological: She is oriented to person, place, and time.  Skin: Skin is warm and dry.  Psychiatric: She has a normal mood and affect.    BP 110/78 (BP Location: Left Arm, Patient Position: Sitting, Cuff Size: Small)   Pulse 69   Temp (!) 97.5 F (36.4 C) (Tympanic)   Ht '5\' 10"'  (1.778 m)   Wt 114 lb 12.8 oz (52.1 kg)   SpO2  97%   BMI 16.47 kg/m  Wt Readings from Last 3 Encounters:  03/26/19 114 lb 12.8 oz (52.1 kg)  03/15/19 112 lb 9.6 oz (51.1 kg)  03/18/18 117 lb 8.1 oz (53.3 kg)     Health Maintenance Due  Topic Date Due  . Hepatitis C Screening  Aug 03, 1956  . TETANUS/TDAP  10/03/1975  . PAP SMEAR-Modifier  10/02/1977  . MAMMOGRAM  10/02/2006  . COLONOSCOPY  10/02/2006  . INFLUENZA VACCINE  03/17/2019    There are no preventive care reminders to display for this patient.  No results found for: TSH Lab Results  Component Value Date   WBC 5.0 03/21/2018   HGB 8.8 (L) 03/21/2018   HCT 27.1 (L) 03/21/2018   MCV 90.0 03/21/2018   PLT 135 (L) 03/21/2018   Lab Results  Component Value Date   NA 133 (L) 03/21/2018   K 3.7 03/21/2018   CO2 31 03/21/2018   GLUCOSE 96 03/21/2018   BUN 6 (L) 03/21/2018   CREATININE 0.64 03/21/2018   BILITOT 0.8 03/19/2018   ALKPHOS 42 03/19/2018   AST 23 03/19/2018   ALT 11 03/19/2018   PROT 5.8 (L) 03/19/2018   ALBUMIN 3.1 (L) 03/19/2018   CALCIUM 8.5 (L) 03/21/2018   ANIONGAP 8 03/21/2018   No results found for: CHOL No results found for: HDL No results found for: LDLCALC No results found for: TRIG No results found for: CHOLHDL No results found for: HGBA1C    Assessment & Plan:   Problem List Items Addressed This Visit    Hypertension   Relevant Orders   CBC with Differential   CMP14+EGFR   Lipid Panel    Other Visit Diagnoses    Special screening for malignant neoplasms, colon    -  Primary   Relevant Orders   Fecal occult blood, imunochemical([facility])   Need for Tdap vaccination       Relevant Orders   Tdap vaccine greater than or equal to 7yo IM (Completed)   Abnormality of gait       Family history of diabetes mellitus       Relevant Orders   CBC with Differential   Hemoglobin A1c    Veronica Stewart was seen today for follow-up.  Diagnoses and all orders for this visit:  Special screening for malignant neoplasms,  colon Normal colon cancer screening.  CDC recommends colorectal screening from ages 48-75. Screening can begin at 83 or earlier in some cases. This screening is used for  a disease when no symptoms are present . Diagnostic test is used for symptoms examples blood in stool, colorectal polyps or coloector cancer, family history or inflammatory bowel disease - chron's or ulcerative colitis .(USPSTF) -     Fecal occult blood, imunochemical([facility]); Future  Need for Tdap vaccination -     Tdap vaccine greater than or equal to 7yo IM  Hypertension, unspecified type Unremarkable Bp DASH diet, medication compliance, 150 minutes of moderate intensity exercise per week. Discussed medication compliance, adverse effects. -     CBC with Differential -     CMP14+EGFR -     Lipid Panel  Abnormality of gait Uses a cane for stability and balance  Family history of diabetes mellitus -     CBC with Differential -     Hemoglobin A1c    No orders of the defined types were placed in this encounter.   Follow-up: No follow-ups on file.    Kerin Perna, NP

## 2019-03-26 NOTE — Patient Instructions (Signed)
Colorectal Cancer Screening  Colorectal cancer screening is a group of tests that are used to check for colorectal cancer before symptoms develop. Colorectal refers to the colon and rectum. The colon and rectum are located at the end of the digestive tract and carry bowel movements out of the body. Who should have screening? All adults starting at age 62 until age 75 should have screening. Your health care provider may recommend screening at age 45. You will have tests every 1-10 years, depending on your results and the type of screening test. You may have screening tests starting at an earlier age, or more frequently than other people, if you have any of the following risk factors:  A personal or family history of colorectal cancer or abnormal growths (polyps).  Inflammatory bowel disease, such as ulcerative colitis or Crohn's disease.  A history of having radiation treatment to the abdomen or pelvic area for cancer.  Colorectal cancer symptoms, such as changes in bowel habits or blood in your stool.  A type of colon cancer syndrome that is passed from parent to child (hereditary), such as: ? Lynch syndrome. ? Familial adenomatous polyposis. ? Turcot syndrome. ? Peutz-Jeghers syndrome. Screening recommendations for adults who are 75-85 years old vary depending on health. How is screening done? There are several types of colorectal screening tests. You may have one or more of the following:  Guaiac-based fecal occult blood testing. For this test, a stool (feces) sample is checked for hidden (occult) blood, which could be a sign of colorectal cancer.  Fecal immunochemical test (FIT). For this test, a stool sample is checked for blood, which could be a sign of colorectal cancer.  Stool DNA test. For this test, a stool sample is checked for blood and changes in DNA that could lead to colorectal cancer.  Sigmoidoscopy. During this test, a thin, flexible tube with a camera on the end  (sigmoidoscope) is used to examine the rectum and the lower colon.  Colonoscopy. During this test, a long, flexible tube with a camera on the end (colonoscope) is used to examine the entire colon and rectum. With a colonoscopy, it is possible to take a sample of tissue (biopsy) and remove small polyps during the test.  Virtual colonoscopy. Instead of a colonoscope, this type of colonoscopy uses X-rays (CT scan) and computers to produce images of the colon and rectum. What are the benefits of screening? Screening reduces your risk for colorectal cancer and can help identify cancer at an early stage, when the cancer can be removed or treated more easily. It is common for polyps to form in the lining of the colon, especially as you age. These polyps may be cancerous or become cancerous over time. Screening can identify these polyps. What are the risks of screening? Each screening test may have different risks.  Stool sample tests have fewer risks than other types of screening tests. However, you may need more tests to confirm results from a stool sample test.  Screening tests that involve X-rays expose you to low levels of radiation, which may slightly increase your cancer risk. The benefit of detecting cancer outweighs the slight increase in risk.  Screening tests such as sigmoidoscopy and colonoscopy may place you at risk for bleeding, intestinal damage, infection, or a reaction to medicines given during the exam. Talk with your health care provider to understand your risk for colorectal cancer and to make a screening plan that is right for you. Questions to ask your health care   provider  When should I start colorectal cancer screening?  What is my risk for colorectal cancer?  How often do I need screening?  Which screening tests do I need?  How do I get my test results?  What do my results mean? Where to find more information Learn more about colorectal cancer screening from:  The  American Cancer Society: www.cancer.org  The National Cancer Institute: www.cancer.gov Summary  Colorectal cancer screening is a group of tests used to check for colorectal cancer before symptoms develop.  Screening reduces your risk for colorectal cancer and can help identify cancer at an early stage, when the cancer can be removed or treated more easily.  All adults starting at age 62 until age 75 should have screening. Your health care provider may recommend screening at age 45.  You may have screening tests starting at an earlier age, or more frequently than other people, if you have certain risk factors.  Talk with your health care provider to understand your risk for colorectal cancer and to make a screening plan that is right for you. This information is not intended to replace advice given to you by your health care provider. Make sure you discuss any questions you have with your health care provider. Document Released: 01/20/2010 Document Revised: 11/22/2018 Document Reviewed: 05/04/2017 Elsevier Patient Education  2020 [employer]  

## 2019-03-27 ENCOUNTER — Other Ambulatory Visit (INDEPENDENT_AMBULATORY_CARE_PROVIDER_SITE_OTHER): Payer: Self-pay

## 2019-03-27 DIAGNOSIS — Z1211 Encounter for screening for malignant neoplasm of colon: Secondary | ICD-10-CM

## 2019-03-27 LAB — CBC WITH DIFFERENTIAL/PLATELET
Basophils Absolute: 0 10*3/uL (ref 0.0–0.2)
Basos: 1 %
EOS (ABSOLUTE): 0.1 10*3/uL (ref 0.0–0.4)
Eos: 2 %
Hematocrit: 44.8 % (ref 34.0–46.6)
Hemoglobin: 14.6 g/dL (ref 11.1–15.9)
Immature Grans (Abs): 0 10*3/uL (ref 0.0–0.1)
Immature Granulocytes: 0 %
Lymphocytes Absolute: 1.3 10*3/uL (ref 0.7–3.1)
Lymphs: 22 %
MCH: 29.9 pg (ref 26.6–33.0)
MCHC: 32.6 g/dL (ref 31.5–35.7)
MCV: 92 fL (ref 79–97)
Monocytes Absolute: 0.7 10*3/uL (ref 0.1–0.9)
Monocytes: 12 %
Neutrophils Absolute: 4 10*3/uL (ref 1.4–7.0)
Neutrophils: 63 %
Platelets: 340 10*3/uL (ref 150–450)
RBC: 4.88 x10E6/uL (ref 3.77–5.28)
RDW: 14.7 % (ref 11.7–15.4)
WBC: 6.2 10*3/uL (ref 3.4–10.8)

## 2019-03-27 LAB — CMP14+EGFR
ALT: 9 IU/L (ref 0–32)
AST: 17 IU/L (ref 0–40)
Albumin/Globulin Ratio: 1.6 (ref 1.2–2.2)
Albumin: 4.6 g/dL (ref 3.8–4.8)
Alkaline Phosphatase: 73 IU/L (ref 39–117)
BUN/Creatinine Ratio: 10 — ABNORMAL LOW (ref 12–28)
BUN: 10 mg/dL (ref 8–27)
Bilirubin Total: 0.3 mg/dL (ref 0.0–1.2)
CO2: 24 mmol/L (ref 20–29)
Calcium: 9.7 mg/dL (ref 8.7–10.3)
Chloride: 89 mmol/L — ABNORMAL LOW (ref 96–106)
Creatinine, Ser: 0.96 mg/dL (ref 0.57–1.00)
GFR calc Af Amer: 73 mL/min/{1.73_m2} (ref >59)
GFR calc non Af Amer: 64 mL/min/{1.73_m2} (ref >59)
Globulin, Total: 2.9 g/dL (ref 1.5–4.5)
Glucose: 78 mg/dL (ref 65–99)
Potassium: 4.4 mmol/L (ref 3.5–5.2)
Sodium: 132 mmol/L — ABNORMAL LOW (ref 134–144)
Total Protein: 7.5 g/dL (ref 6.0–8.5)

## 2019-03-27 LAB — LIPID PANEL
Chol/HDL Ratio: 1.7 ratio (ref 0.0–4.4)
Cholesterol, Total: 173 mg/dL (ref 100–199)
HDL: 102 mg/dL (ref >39)
LDL Calculated: 48 mg/dL (ref 0–99)
Triglycerides: 114 mg/dL (ref 0–149)
VLDL Cholesterol Cal: 23 mg/dL (ref 5–40)

## 2019-03-27 LAB — HEMOGLOBIN A1C
Est. average glucose Bld gHb Est-mCnc: 103 mg/dL
Hgb A1c MFr Bld: 5.2 % (ref 4.8–5.6)

## 2019-03-28 LAB — FECAL OCCULT BLOOD, IMMUNOCHEMICAL: Fecal Occult Bld: POSITIVE — AB

## 2019-03-29 ENCOUNTER — Other Ambulatory Visit (INDEPENDENT_AMBULATORY_CARE_PROVIDER_SITE_OTHER): Payer: Self-pay | Admitting: Primary Care

## 2019-03-29 DIAGNOSIS — K921 Melena: Secondary | ICD-10-CM

## 2019-04-11 ENCOUNTER — Encounter (INDEPENDENT_AMBULATORY_CARE_PROVIDER_SITE_OTHER): Payer: Self-pay

## 2019-05-02 ENCOUNTER — Encounter: Payer: Self-pay | Admitting: Primary Care

## 2019-05-03 ENCOUNTER — Other Ambulatory Visit (INDEPENDENT_AMBULATORY_CARE_PROVIDER_SITE_OTHER): Payer: Self-pay | Admitting: Primary Care

## 2019-05-04 ENCOUNTER — Other Ambulatory Visit (INDEPENDENT_AMBULATORY_CARE_PROVIDER_SITE_OTHER): Payer: Self-pay | Admitting: Primary Care

## 2019-05-21 ENCOUNTER — Other Ambulatory Visit (INDEPENDENT_AMBULATORY_CARE_PROVIDER_SITE_OTHER): Payer: Self-pay | Admitting: Family Medicine

## 2019-06-26 ENCOUNTER — Other Ambulatory Visit: Payer: Self-pay

## 2019-06-26 ENCOUNTER — Encounter (INDEPENDENT_AMBULATORY_CARE_PROVIDER_SITE_OTHER): Payer: Self-pay | Admitting: Primary Care

## 2019-06-26 ENCOUNTER — Ambulatory Visit (INDEPENDENT_AMBULATORY_CARE_PROVIDER_SITE_OTHER): Payer: Medicaid Other | Admitting: Primary Care

## 2019-06-26 VITALS — BP 108/76 | HR 75 | Temp 97.3°F | Ht 70.0 in | Wt 114.0 lb

## 2019-06-26 DIAGNOSIS — I10 Essential (primary) hypertension: Secondary | ICD-10-CM | POA: Diagnosis not present

## 2019-06-26 DIAGNOSIS — R222 Localized swelling, mass and lump, trunk: Secondary | ICD-10-CM

## 2019-06-26 DIAGNOSIS — J4520 Mild intermittent asthma, uncomplicated: Secondary | ICD-10-CM | POA: Diagnosis not present

## 2019-06-26 DIAGNOSIS — Z23 Encounter for immunization: Secondary | ICD-10-CM | POA: Diagnosis not present

## 2019-06-26 MED ORDER — LISINOPRIL-HYDROCHLOROTHIAZIDE 20-25 MG PO TABS: 1.0000 | ORAL_TABLET | Freq: Every day | ORAL | 0 refills | Status: DC

## 2019-06-26 MED ORDER — AMLODIPINE BESYLATE 10 MG PO TABS: 10.0000 mg | ORAL_TABLET | Freq: Every day | ORAL | 0 refills | Status: DC

## 2019-06-26 MED ORDER — ALBUTEROL SULFATE HFA 108 (90 BASE) MCG/ACT IN AERS: INHALATION_SPRAY | RESPIRATORY_TRACT | 2 refills | Status: DC

## 2019-06-26 MED ORDER — ATENOLOL 50 MG PO TABS: 50.0000 mg | ORAL_TABLET | Freq: Every day | ORAL | 0 refills | Status: DC

## 2019-06-26 MED ORDER — PULMICORT FLEXHALER 180 MCG/ACT IN AEPB: 2.0000 | INHALATION_SPRAY | Freq: Two times a day (BID) | RESPIRATORY_TRACT | 3 refills | Status: DC

## 2019-06-26 NOTE — Patient Instructions (Signed)
Hypertension, Adult High blood pressure (hypertension) is when the force of blood pumping through the arteries is too strong. The arteries are the blood vessels that carry blood from the heart throughout the body. Hypertension forces the heart to work harder to pump blood and may cause arteries to become narrow or stiff. Untreated or uncontrolled hypertension can cause a heart attack, heart failure, a stroke, kidney disease, and other problems. A blood pressure reading consists of a higher number over a lower number. Ideally, your blood pressure should be below 120/80. The first ("top") number is called the systolic pressure. It is a measure of the pressure in your arteries as your heart beats. The second ("bottom") number is called the diastolic pressure. It is a measure of the pressure in your arteries as the heart relaxes. What are the causes? The exact cause of this condition is not known. There are some conditions that result in or are related to high blood pressure. What increases the risk? Some risk factors for high blood pressure are under your control. The following factors may make you more likely to develop this condition:  Smoking.  Having type 2 diabetes mellitus, high cholesterol, or both.  Not getting enough exercise or physical activity.  Being overweight.  Having too much fat, sugar, calories, or salt (sodium) in your diet.  Drinking too much alcohol. Some risk factors for high blood pressure may be difficult or impossible to change. Some of these factors include:  Having chronic kidney disease.  Having a family history of high blood pressure.  Age. Risk increases with age.  Race. You may be at higher risk if you are African American.  Gender. Men are at higher risk than women before age 42. After age 7, women are at higher risk than men.  Having obstructive sleep apnea.  Stress. What are the signs or symptoms? High blood pressure may not cause symptoms. Very  high blood pressure (hypertensive crisis) may cause:  Headache.  Anxiety.  Shortness of breath.  Nosebleed.  Nausea and vomiting.  Vision changes.  Severe chest pain.  Seizures. How is this diagnosed? This condition is diagnosed by measuring your blood pressure while you are seated, with your arm resting on a flat surface, your legs uncrossed, and your feet flat on the floor. The cuff of the blood pressure monitor will be placed directly against the skin of your upper arm at the level of your heart. It should be measured at least twice using the same arm. Certain conditions can cause a difference in blood pressure between your right and left arms. Certain factors can cause blood pressure readings to be lower or higher than normal for a short period of time:  When your blood pressure is higher when you are in a health care provider's office than when you are at home, this is called white coat hypertension. Most people with this condition do not need medicines.  When your blood pressure is higher at home than when you are in a health care provider's office, this is called masked hypertension. Most people with this condition may need medicines to control blood pressure. If you have a high blood pressure reading during one visit or you have normal blood pressure with other risk factors, you may be asked to:  Return on a different day to have your blood pressure checked again.  Monitor your blood pressure at home for 1 week or longer. If you are diagnosed with hypertension, you may have other blood  or imaging tests to help your health care provider understand your overall risk for other conditions. How is this treated? This condition is treated by making healthy lifestyle changes, such as eating healthy foods, exercising more, and reducing your alcohol intake. Your health care provider may prescribe medicine if lifestyle changes are not enough to get your blood pressure under control, and  if:  Your systolic blood pressure is above 130.  Your diastolic blood pressure is above 80. Your personal target blood pressure may vary depending on your medical conditions, your age, and other factors. Follow these instructions at home: Eating and drinking   Eat a diet that is high in fiber and potassium, and low in sodium, added sugar, and fat. An example eating plan is called the DASH (Dietary Approaches to Stop Hypertension) diet. To eat this way: ? Eat plenty of fresh fruits and vegetables. Try to fill one half of your plate at each meal with fruits and vegetables. ? Eat whole grains, such as whole-wheat pasta, brown rice, or whole-grain bread. Fill about one fourth of your plate with whole grains. ? Eat or drink low-fat dairy products, such as skim milk or low-fat yogurt. ? Avoid fatty cuts of meat, processed or cured meats, and poultry with skin. Fill about one fourth of your plate with lean proteins, such as fish, chicken without skin, beans, eggs, or tofu. ? Avoid pre-made and processed foods. These tend to be higher in sodium, added sugar, and fat.  Reduce your daily sodium intake. Most people with hypertension should eat less than 1,500 mg of sodium a day.  Do not drink alcohol if: ? Your health care provider tells you not to drink. ? You are pregnant, may be pregnant, or are planning to become pregnant.  If you drink alcohol: ? Limit how much you use to:  0-1 drink a day for women.  0-2 drinks a day for men. ? Be aware of how much alcohol is in your drink. In the [place] one drink equals one 12 oz bottle of beer (355 mL), one 5 oz glass of wine (148 mL), or one 1 oz glass of hard liquor (44 mL). Lifestyle   Work with your health care provider to maintain a healthy body weight or to lose weight. Ask what an ideal weight is for you.  Get at least 30 minutes of exercise most days of the week. Activities may include walking, swimming, or biking.  Include exercise to  strengthen your muscles (resistance exercise), such as Pilates or lifting weights, as part of your weekly exercise routine. Try to do these types of exercises for 30 minutes at least 3 days a week.  Do not use any products that contain nicotine or tobacco, such as cigarettes, e-cigarettes, and chewing tobacco. If you need help quitting, ask your health care provider.  Monitor your blood pressure at home as told by your health care provider.  Keep all follow-up visits as told by your health care provider. This is important. Medicines  Take over-the-counter and prescription medicines only as told by your health care provider. Follow directions carefully. Blood pressure medicines must be taken as prescribed.  Do not skip doses of blood pressure medicine. Doing this puts you at risk for problems and can make the medicine less effective.  Ask your health care provider about side effects or reactions to medicines that you should watch for. Contact a health care provider if you:  Think you are having a reaction to a medicine  you are taking.  Have headaches that keep coming back (recurring).  Feel dizzy.  Have swelling in your ankles.  Have trouble with your vision. Get help right away if you:  Develop a severe headache or confusion.  Have unusual weakness or numbness.  Feel faint.  Have severe pain in your chest or abdomen.  Vomit repeatedly.  Have trouble breathing. Summary  Hypertension is when the force of blood pumping through your arteries is too strong. If this condition is not controlled, it may put you at risk for serious complications.  Your personal target blood pressure may vary depending on your medical conditions, your age, and other factors. For most people, a normal blood pressure is less than 120/80.  Hypertension is treated with lifestyle changes, medicines, or a combination of both. Lifestyle changes include losing weight, eating a healthy, low-sodium diet,  exercising more, and limiting alcohol. This information is not intended to replace advice given to you by your health care provider. Make sure you discuss any questions you have with your health care provider. Document Released: 08/02/2005 Document Revised: 04/12/2018 Document Reviewed: 04/12/2018 Elsevier Patient Education  2020 [place] DASH stands for "Dietary Approaches to Stop Hypertension." The DASH eating plan is a healthy eating plan that has been shown to reduce high blood pressure (hypertension). It may also reduce your risk for type 2 diabetes, heart disease, and stroke. The DASH eating plan may also help with weight loss. What are tips for following this plan?  General guidelines  Avoid eating more than 2,300 mg (milligrams) of salt (sodium) a day. If you have hypertension, you may need to reduce your sodium intake to 1,500 mg a day.  Limit alcohol intake to no more than 1 drink a day for nonpregnant women and 2 drinks a day for men. One drink equals 12 oz of beer, 5 oz of wine, or 1 oz of hard liquor.  Work with your health care provider to maintain a healthy body weight or to lose weight. Ask what an ideal weight is for you.  Get at least 30 minutes of exercise that causes your heart to beat faster (aerobic exercise) most days of the week. Activities may include walking, swimming, or biking.  Work with your health care provider or diet and nutrition specialist (dietitian) to adjust your eating plan to your individual calorie needs. Reading food labels   Check food labels for the amount of sodium per serving. Choose foods with less than 5 percent of the Daily Value of sodium. Generally, foods with less than 300 mg of sodium per serving fit into this eating plan.  To find whole grains, look for the word "whole" as the first word in the ingredient list. Shopping  Buy products labeled as "low-sodium" or "no salt added."  Buy fresh foods. Avoid canned  foods and premade or frozen meals. Cooking  Avoid adding salt when cooking. Use salt-free seasonings or herbs instead of table salt or sea salt. Check with your health care provider or pharmacist before using salt substitutes.  Do not fry foods. Cook foods using healthy methods such as baking, boiling, grilling, and broiling instead.  Cook with heart-healthy oils, such as olive, canola, soybean, or sunflower oil. Meal planning  Eat a balanced diet that includes: ? 5 or more servings of fruits and vegetables each day. At each meal, try to fill half of your plate with fruits and vegetables. ? Up to 6-8 servings of whole grains  each day. ? Less than 6 oz of lean meat, poultry, or fish each day. A 3-oz serving of meat is about the same size as a deck of cards. One egg equals 1 oz. ? 2 servings of low-fat dairy each day. ? A serving of nuts, seeds, or beans 5 times each week. ? Heart-healthy fats. Healthy fats called Omega-3 fatty acids are found in foods such as flaxseeds and coldwater fish, like sardines, salmon, and mackerel.  Limit how much you eat of the following: ? Canned or prepackaged foods. ? Food that is high in trans fat, such as fried foods. ? Food that is high in saturated fat, such as fatty meat. ? Sweets, desserts, sugary drinks, and other foods with added sugar. ? Full-fat dairy products.  Do not salt foods before eating.  Try to eat at least 2 vegetarian meals each week.  Eat more home-cooked food and less restaurant, buffet, and fast food.  When eating at a restaurant, ask that your food be prepared with less salt or no salt, if possible. What foods are recommended? The items listed may not be a complete list. Talk with your dietitian about what dietary choices are best for you. Grains Whole-grain or whole-wheat bread. Whole-grain or whole-wheat pasta. Brown rice. [place]. Bulgur. Whole-grain and low-sodium cereals. Pita bread. Low-fat, low-sodium crackers.  Whole-wheat flour tortillas. Vegetables Fresh or frozen vegetables (raw, steamed, roasted, or grilled). Low-sodium or reduced-sodium tomato and vegetable juice. Low-sodium or reduced-sodium tomato sauce and tomato paste. Low-sodium or reduced-sodium canned vegetables. Fruits All fresh, dried, or frozen fruit. Canned fruit in natural juice (without added sugar). Meat and other protein foods Skinless chicken or [place]. Ground chicken or [place]. Pork with fat trimmed off. Fish and seafood. Egg whites. Dried beans, peas, or lentils. Unsalted nuts, nut butters, and seeds. Unsalted canned beans. Lean cuts of beef with fat trimmed off. Low-sodium, lean deli meat. Dairy Low-fat (1%) or fat-free (skim) milk. Fat-free, low-fat, or reduced-fat cheeses. Nonfat, low-sodium ricotta or cottage cheese. Low-fat or nonfat yogurt. Low-fat, low-sodium cheese. Fats and oils Soft margarine without trans fats. Vegetable oil. Low-fat, reduced-fat, or light mayonnaise and salad dressings (reduced-sodium). Canola, safflower, olive, soybean, and sunflower oils. Avocado. Seasoning and other foods Herbs. Spices. Seasoning mixes without salt. Unsalted popcorn and pretzels. Fat-free sweets. What foods are not recommended? The items listed may not be a complete list. Talk with your dietitian about what dietary choices are best for you. Grains Baked goods made with fat, such as croissants, muffins, or some breads. Dry pasta or rice meal packs. Vegetables Creamed or fried vegetables. Vegetables in a cheese sauce. Regular canned vegetables (not low-sodium or reduced-sodium). Regular canned tomato sauce and paste (not low-sodium or reduced-sodium). Regular tomato and vegetable juice (not low-sodium or reduced-sodium). Angie Fava. Olives. Fruits Canned fruit in a light or heavy syrup. Fried fruit. Fruit in cream or butter sauce. Meat and other protein foods Fatty cuts of meat. Ribs. Fried meat. Berniece Salines. Sausage. Bologna and other  processed lunch meats. Salami. Fatback. Hotdogs. Bratwurst. Salted nuts and seeds. Canned beans with added salt. Canned or smoked fish. Whole eggs or egg yolks. Chicken or [place] with skin. Dairy Whole or 2% milk, cream, and half-and-half. Whole or full-fat cream cheese. Whole-fat or sweetened yogurt. Full-fat cheese. Nondairy creamers. Whipped toppings. Processed cheese and cheese spreads. Fats and oils Butter. Stick margarine. Lard. Shortening. Ghee. Bacon fat. Tropical oils, such as coconut, palm kernel, or palm oil. Seasoning and other foods Salted popcorn and  pretzels. Onion salt, garlic salt, seasoned salt, table salt, and sea salt. Worcestershire sauce. Tartar sauce. Barbecue sauce. Teriyaki sauce. Soy sauce, including reduced-sodium. Steak sauce. Canned and packaged gravies. Fish sauce. Oyster sauce. Cocktail sauce. Horseradish that you find on the shelf. Ketchup. Mustard. Meat flavorings and tenderizers. Bouillon cubes. Hot sauce and Tabasco sauce. Premade or packaged marinades. Premade or packaged taco seasonings. Relishes. Regular salad dressings. Where to find more information:  National Heart, Lung, and [place]: https://wilson-eaton.com/  American Heart Association: www.heart.org Summary  The DASH eating plan is a healthy eating plan that has been shown to reduce high blood pressure (hypertension). It may also reduce your risk for type 2 diabetes, heart disease, and stroke.  With the DASH eating plan, you should limit salt (sodium) intake to 2,300 mg a day. If you have hypertension, you may need to reduce your sodium intake to 1,500 mg a day.  When on the DASH eating plan, aim to eat more fresh fruits and vegetables, whole grains, lean proteins, low-fat dairy, and heart-healthy fats.  Work with your health care provider or diet and nutrition specialist (dietitian) to adjust your eating plan to your individual calorie needs. This information is not intended to replace advice given to  you by your health care provider. Make sure you discuss any questions you have with your health care provider. Document Released: 07/22/2011 Document Revised: 07/15/2017 Document Reviewed: 07/26/2016 Elsevier Patient Education  2020 [employer].

## 2019-06-26 NOTE — Progress Notes (Signed)
Established Patient Office Visit  Subjective:  Patient ID: Veronica Stewart, female    DOB: [birth year]  Age: 63 y.o. MRN: [id number]  CC:  Chief Complaint  Patient presents with  . Hypertension    HPI Veronica Stewart presents for management of hypertension - she denies shortness of breath, headaches, chest pain or lower extremity edema. She is concern about a nodule on her back previously popped it pus release from it now triple the size and painful when something rub against it.  Past Medical History:  Diagnosis Date  . Asthma   . Hypertension   something rubs against it.  Past Surgical History:  Procedure Laterality Date  . INTRAMEDULLARY (IM) NAIL INTERTROCHANTERIC Left 03/18/2018   Procedure: INTRAMEDULLARY (IM) NAIL INTERTROCHANTRIC;  Surgeon: Shona Needles, MD;  Location: [facility];  Service: Orthopedics;  Laterality: Left;    Family History  Problem Relation Age of Onset  . Sudden Cardiac Death Neg Hx     Social History   Socioeconomic History  . Marital status: Legally Separated    Spouse name: Not on file  . Number of children: Not on file  . Years of education: Not on file  . Highest education level: Not on file  Occupational History  . Not on file  Social Needs  . Financial resource strain: Not on file  . Food insecurity    Worry: Not on file    Inability: Not on file  . Transportation needs    Medical: Not on file    Non-medical: Not on file  Tobacco Use  . Smoking status: Current Every Day Smoker    Packs/day: 0.50    Types: Cigarettes  . Smokeless tobacco: Never Used  Substance and Sexual Activity  . Alcohol use: Yes    Comment: two 40 oz beers most days   . Drug use: Yes    Types: Marijuana  . Sexual activity: Not on file  Lifestyle  . Physical activity    Days per week: Not on file    Minutes per session: Not on file  . Stress: Not on file  Relationships  . Social Herbalist on phone: Not on file    Gets together: Not on file    Attends religious service: Not on file    Active member of club or organization: Not on file    Attends meetings of clubs or organizations: Not on file    Relationship status: Not on file  . Intimate partner violence    Fear of current or ex partner: Not on file    Emotionally abused: Not on file    Physically abused: Not on file    Forced sexual activity: Not on file  Other Topics Concern  . Not on file  Social History Narrative  . Not on file    Outpatient Medications Prior to Visit  Medication Sig Dispense Refill  . amLODipine (NORVASC) 10 MG tablet TAKE 1 TABLET BY MOUTH DAILY 90 tablet 0  . atenolol (TENORMIN) 50 MG tablet TAKE 1 TABLET BY MOUTH DAILY 90 tablet 0  . lisinopril-hydrochlorothiazide (ZESTORETIC) 20-25 MG tablet TAKE 1 TABLET BY MOUTH DAILY 90 tablet 0  . cholecalciferol 2000 units TABS Take 1 tablet (2,000 Units total) by mouth daily. (Patient not taking: Reported on 06/26/2019) 30 tablet 1  . albuterol (VENTOLIN HFA) 108 (90 Base) MCG/ACT inhaler INHALE 2 PUFFS BY MOUTH EVERY 6 HOURS AS NEEDED FOR WHEEZING AND SHORTNESS OF BREATH (Patient not taking: Reported  on 06/26/2019) 8.5 g 2   No facility-administered medications prior to visit.     No Known Allergies  ROS Review of Systems  Skin: Positive for color change.       See photo  All other systems reviewed and are negative.     Objective:    Physical Exam  Constitutional: She is oriented to person, place, and time. She appears well-developed and well-nourished.  HENT:  Head: Normocephalic.  Eyes: Pupils are equal, round, and reactive to light. EOM are normal.  Neck: Neck supple.  Cardiovascular: Normal rate and regular rhythm.  Pulmonary/Chest: Effort normal and breath sounds normal.  Abdominal: Soft. Bowel sounds are normal.  Musculoskeletal: Normal range of motion.  Neurological: She is oriented to person, place, and time.  Psychiatric: She has a normal mood and affect. Her behavior is normal.     BP 108/76 (BP Location: Left Arm, Patient Position: Sitting, Cuff Size: Small)   Pulse 75   Temp (!) 97.3 F (36.3 C) (Temporal)   Ht 5\' 10"  (1.778 m)   Wt 114 lb (51.7 kg)   SpO2 97%   BMI 16.36 kg/m  Wt Readings from Last 3 Encounters:  06/26/19 114 lb (51.7 kg)  03/26/19 114 lb 12.8 oz (52.1 kg)  03/15/19 112 lb 9.6 oz (51.1 kg)     Health Maintenance Due  Topic Date Due  . Hepatitis C Screening  1955/11/21  . PAP SMEAR-Modifier  10/02/1977  . MAMMOGRAM  10/02/2006  . COLONOSCOPY  10/02/2006    There are no preventive care reminders to display for this patient.  No results found for: TSH Lab Results  Component Value Date   WBC 6.2 03/26/2019   HGB 14.6 03/26/2019   HCT 44.8 03/26/2019   MCV 92 03/26/2019   PLT 340 03/26/2019   Lab Results  Component Value Date   NA 132 (L) 03/26/2019   K 4.4 03/26/2019   CO2 24 03/26/2019   GLUCOSE 78 03/26/2019   BUN 10 03/26/2019   CREATININE 0.96 03/26/2019   BILITOT 0.3 03/26/2019   ALKPHOS 73 03/26/2019   AST 17 03/26/2019   ALT 9 03/26/2019   PROT 7.5 03/26/2019   ALBUMIN 4.6 03/26/2019   CALCIUM 9.7 03/26/2019   ANIONGAP 8 03/21/2018   Lab Results  Component Value Date   CHOL 173 03/26/2019   Lab Results  Component Value Date   HDL 102 03/26/2019   Lab Results  Component Value Date   LDLCALC 48 03/26/2019   Lab Results  Component Value Date   TRIG 114 03/26/2019   Lab Results  Component Value Date   CHOLHDL 1.7 03/26/2019   Lab Results  Component Value Date   HGBA1C 5.2 03/26/2019      Assessment & Plan:  Veronica Stewart was seen today for hypertension.  Diagnoses and all orders for this visit:  Mild intermittent asthma without complication Controlled with SABA and LABA remind to rinse mouth after use to avoid thrush. albuterol (VENTOLIN HFA) 108 (90 Base) MCG/ACT inhaler; INHALE 2 PUFFS BY MOUTH EVERY 6 HOURS AS NEEDED FOR WHEEZING AND SHORTNESS OF BREATH -     budesonide (PULMICORT  FLEXHALER) 180 MCG/ACT inhaler; Inhale 2 puffs into the lungs 2 (two) times daily.  Benign hypertension Blood pressure is well controlled. Remains on a low-sodium, medication compliance, 150 minutes of moderate intensity exercise per week. Discussed medication compliance, adverse effects. -     amLODipine (NORVASC) 10 MG tablet; Take 1 tablet (10 mg total)  by mouth daily. -     atenolol (TENORMIN) 50 MG tablet; Take 1 tablet (50 mg total) by mouth daily. -     lisinopril-hydrochlorothiazide (ZESTORETIC) 20-25 MG tablet; Take 1 tablet by mouth daily.  Nodule of skin of back  -     Ambulatory referral to Dermatology  Need for immunization against influenza -     Flu Vaccine QUAD 36+ mos IM  Other orders -     albuterol (VENTOLIN HFA) 108 (90 Base) MCG/ACT inhaler; INHALE 2 PUFFS BY MOUTH EVERY 6 HOURS AS NEEDED FOR WHEEZING AND SHORTNESS OF BREATH -     budesonide (PULMICORT FLEXHALER) 180 MCG/ACT inhaler; Inhale 2 puffs into the lungs 2 (two) times daily.    Meds ordered this encounter  Medications  . albuterol (VENTOLIN HFA) 108 (90 Base) MCG/ACT inhaler    Sig: INHALE 2 PUFFS BY MOUTH EVERY 6 HOURS AS NEEDED FOR WHEEZING AND SHORTNESS OF BREATH    Dispense:  8.5 g    Refill:  2  . budesonide (PULMICORT FLEXHALER) 180 MCG/ACT inhaler    Sig: Inhale 2 puffs into the lungs 2 (two) times daily.    Dispense:  1 each    Refill:  3  . amLODipine (NORVASC) 10 MG tablet    Sig: Take 1 tablet (10 mg total) by mouth daily.    Dispense:  90 tablet    Refill:  0    **Patient requests 90 days supply**  . atenolol (TENORMIN) 50 MG tablet    Sig: Take 1 tablet (50 mg total) by mouth daily.    Dispense:  90 tablet    Refill:  0    **Patient requests 90 days supply**  . lisinopril-hydrochlorothiazide (ZESTORETIC) 20-25 MG tablet    Sig: Take 1 tablet by mouth daily.    Dispense:  90 tablet    Refill:  0    **Patient requests 90 days supply**    Follow-up: Return in about 6 months  (around 12/24/2019) for HTN.    Kerin Perna, NP

## 2019-07-09 ENCOUNTER — Other Ambulatory Visit (INDEPENDENT_AMBULATORY_CARE_PROVIDER_SITE_OTHER): Payer: Self-pay | Admitting: Primary Care

## 2019-07-09 DIAGNOSIS — J4520 Mild intermittent asthma, uncomplicated: Secondary | ICD-10-CM

## 2019-07-09 MED ORDER — ALBUTEROL SULFATE (2.5 MG/3ML) 0.083% IN NEBU: 2.5000 mg | INHALATION_SOLUTION | Freq: Once | RESPIRATORY_TRACT | Status: DC

## 2019-07-09 MED ORDER — FLUTICASONE-SALMETEROL 100-50 MCG/DOSE IN AEPB: 1.0000 | INHALATION_SPRAY | Freq: Two times a day (BID) | RESPIRATORY_TRACT | 3 refills | Status: DC

## 2019-10-25 ENCOUNTER — Other Ambulatory Visit: Payer: Self-pay

## 2019-10-25 ENCOUNTER — Ambulatory Visit (INDEPENDENT_AMBULATORY_CARE_PROVIDER_SITE_OTHER): Payer: Medicaid Other

## 2019-10-25 ENCOUNTER — Ambulatory Visit (HOSPITAL_COMMUNITY)
Admission: EM | Admit: 2019-10-25 | Discharge: 2019-10-25 | Disposition: A | Discharge status: Home / Self Care | Payer: Medicaid Other | Attending: Family Medicine | Admitting: Family Medicine

## 2019-10-25 ENCOUNTER — Encounter (HOSPITAL_COMMUNITY): Payer: Self-pay | Admitting: Emergency Medicine

## 2019-10-25 DIAGNOSIS — Y9301 Activity, walking, marching and hiking: Secondary | ICD-10-CM

## 2019-10-25 DIAGNOSIS — S32592A Other specified fracture of left pubis, initial encounter for closed fracture: Secondary | ICD-10-CM

## 2019-10-25 DIAGNOSIS — S32512A Fracture of superior rim of left pubis, initial encounter for closed fracture: Secondary | ICD-10-CM | POA: Diagnosis not present

## 2019-10-25 DIAGNOSIS — R52 Pain, unspecified: Secondary | ICD-10-CM

## 2019-10-25 DIAGNOSIS — W1830XA Fall on same level, unspecified, initial encounter: Secondary | ICD-10-CM | POA: Diagnosis not present

## 2019-10-25 DIAGNOSIS — M25552 Pain in left hip: Secondary | ICD-10-CM

## 2019-10-25 DIAGNOSIS — W19XXXA Unspecified fall, initial encounter: Secondary | ICD-10-CM

## 2019-10-25 IMAGING — DX DG HIP (WITH OR WITHOUT PELVIS) 2-3V*L*
3 series · 3 of 3 positions shown · non-contrast
Comparison: Radiograph 03/28/2018

CLINICAL DATA: Fall last night. Unable to bear weight on left leg.

EXAM:
DG HIP (WITH OR WITHOUT PELVIS) 2-3V LEFT

[pelvis ap]
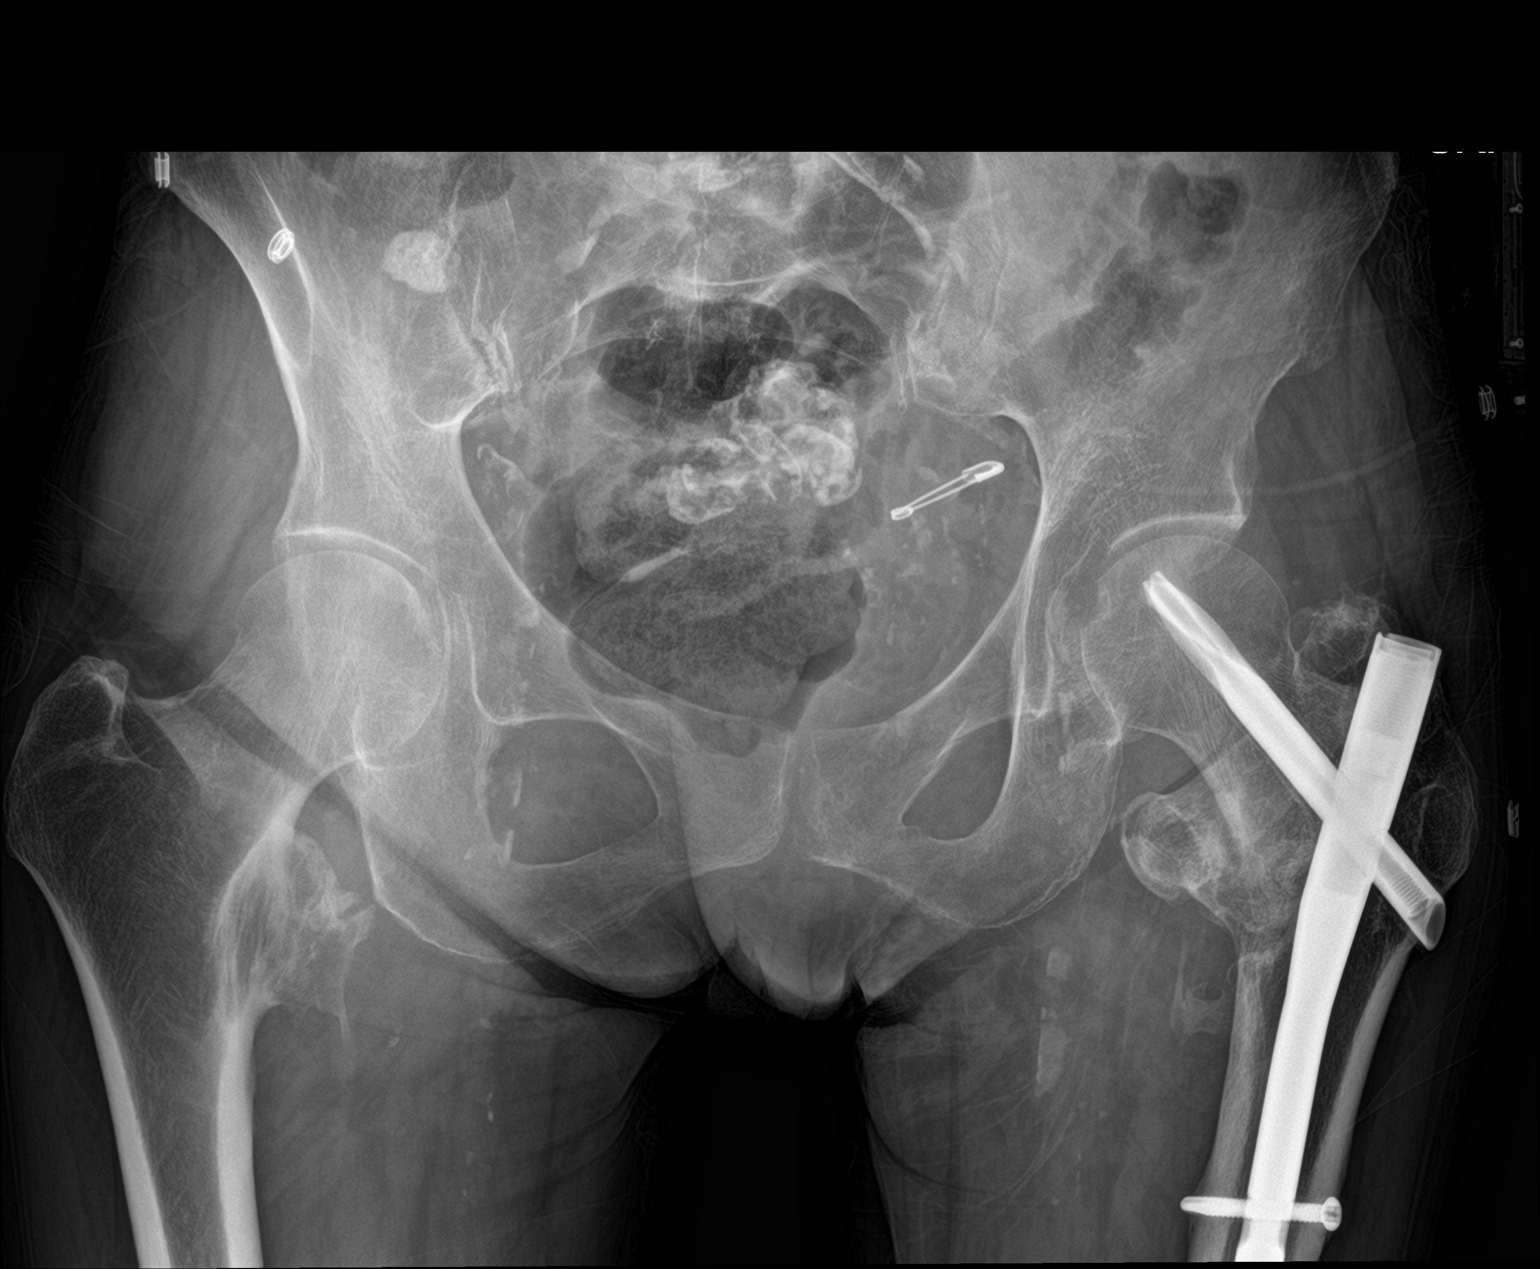

[hip ap]
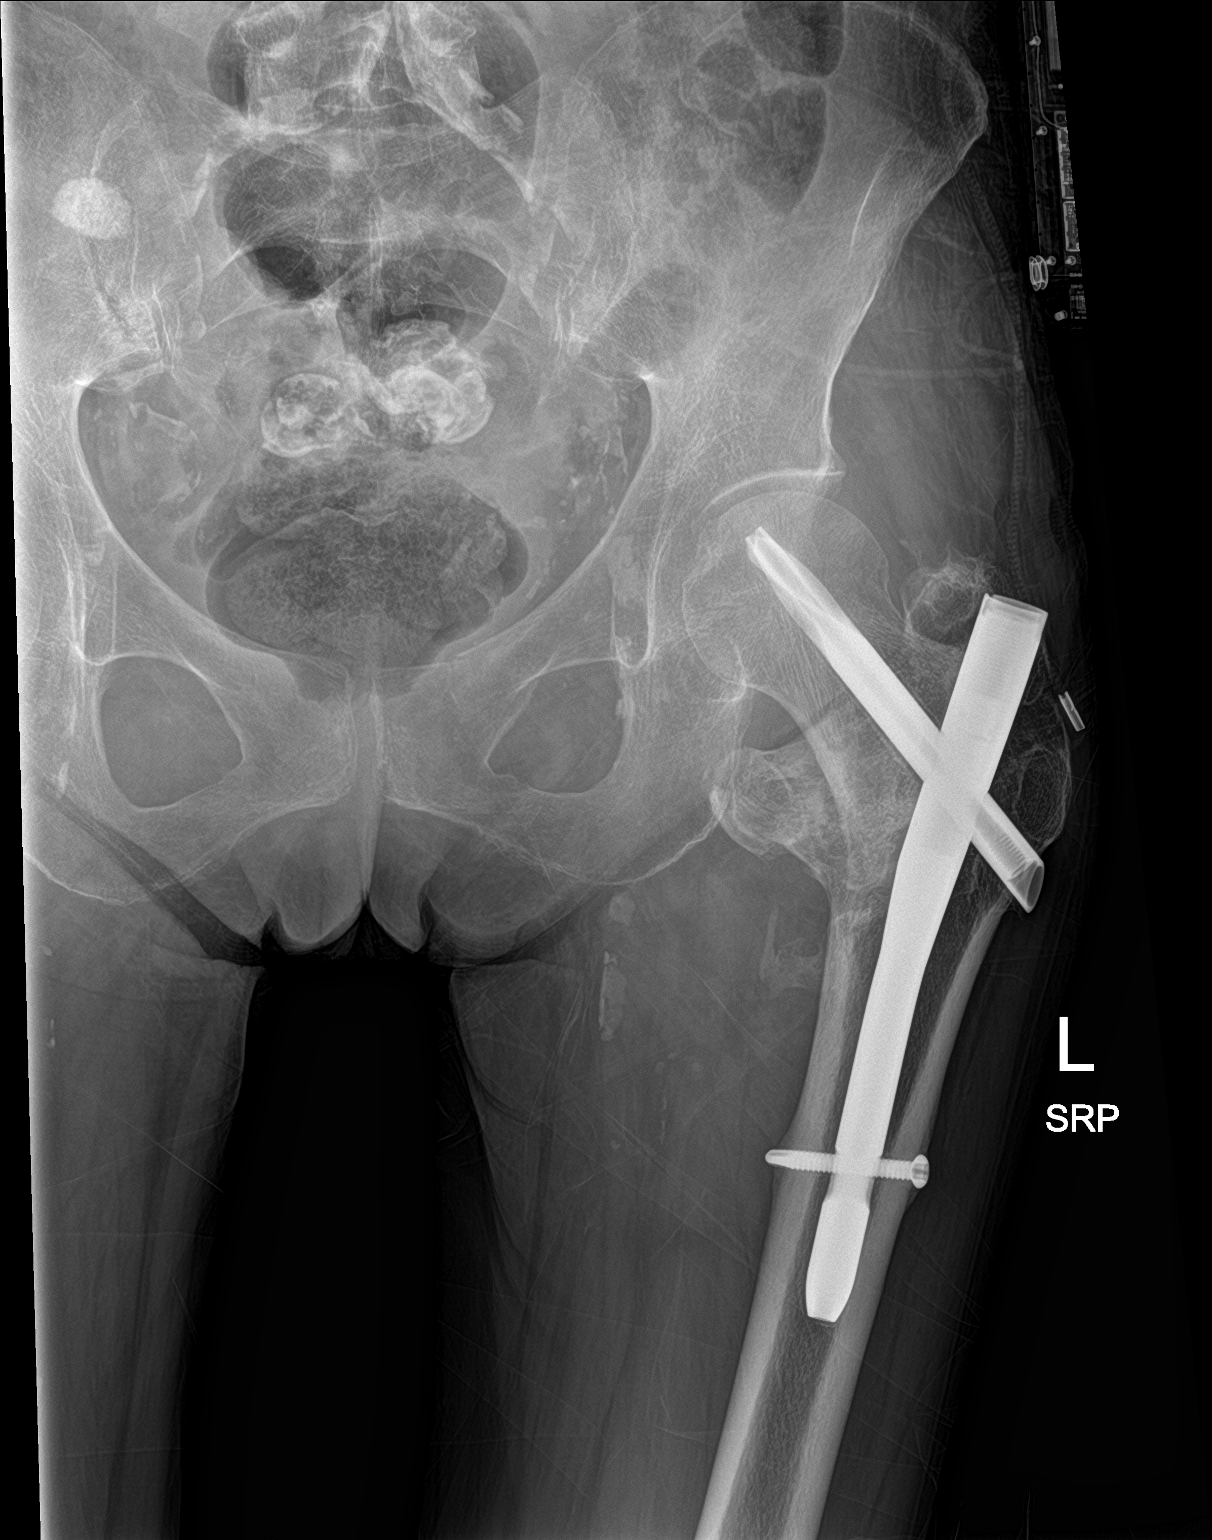

[hip lat]
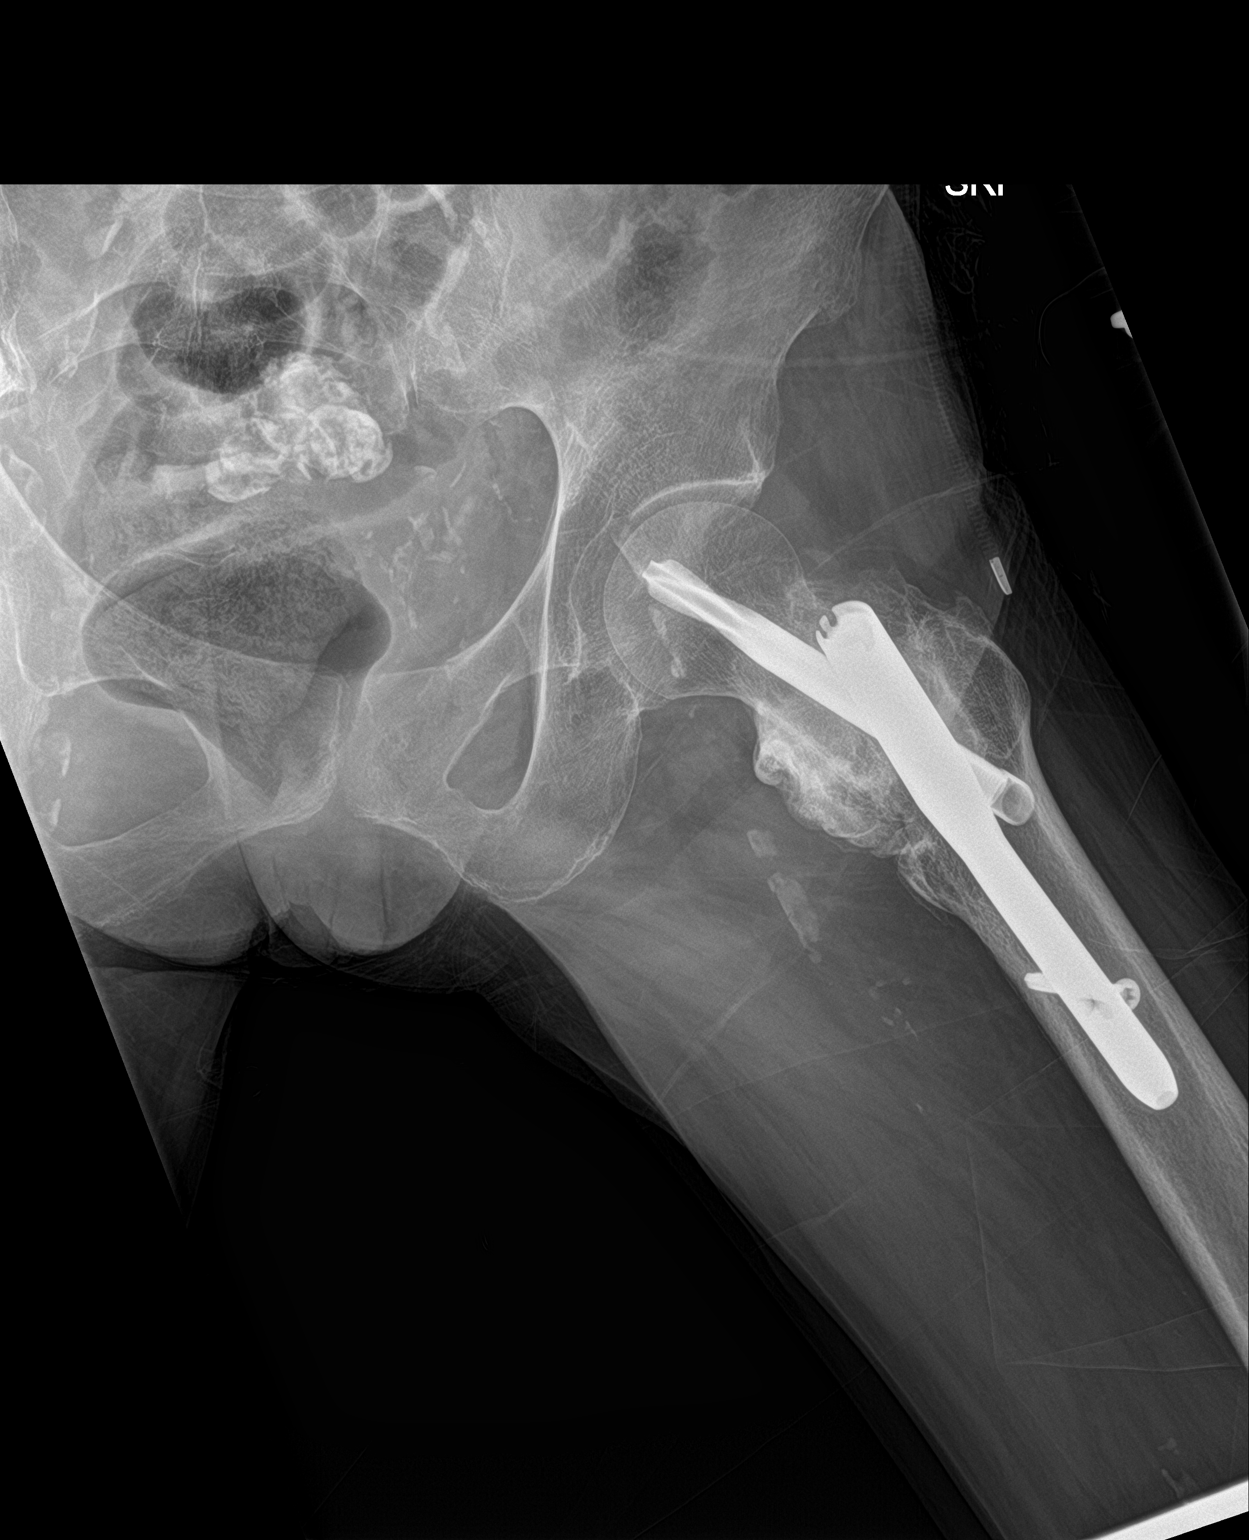

[3 of 3 positions shown; findings below may reference images not displayed]

FINDINGS: Intramedullary rod with distal locking trans trochanteric screw
traverses remote intertrochanteric femur fracture. Previous fracture
is healed. No periprosthetic lucency or acute periprosthetic
fracture.

Minimally displaced acute fracture of the left superior and inferior
pubic rami, superior ramus fracture is at the pubic symphyseal
junction. No symphyseal offset.

Irregularity and osseous excrescence about the right lesser
trochanter is chronic and stable from 01/05/2004 pelvis CT. Vascular
calcifications and probable calcified uterine fibroids in the
pelvis.
IMPRESSION: Acute minimally displaced left superior and inferior pubic rami
fractures.

## 2019-10-25 MED ORDER — HYDROCODONE-ACETAMINOPHEN 5-325 MG PO TABS
ORAL_TABLET | ORAL | Status: AC
  Filled 2019-10-25: qty 1

## 2019-10-25 MED ORDER — HYDROCODONE-ACETAMINOPHEN 5-325 MG PO TABS
1.0000 | ORAL_TABLET | Freq: Once | ORAL | Status: AC
  Administered 2019-10-25: 1 via ORAL

## 2019-10-25 MED ORDER — TRAMADOL HCL 50 MG PO TABS: 50.0000 mg | ORAL_TABLET | Freq: Four times a day (QID) | ORAL | 0 refills | Status: DC | PRN

## 2019-10-25 NOTE — Discharge Instructions (Signed)
Walk as little as possible Use your walker when walking around.  Put light weight on the left leg. Take Tylenol as needed for moderate pain If pain is severe you may take a tramadol Do not take tramadol and drink any alcohol as it may increase risk of falls and drowsiness Make sure your family is at home when you take pain medicine Call for a follow-up with Dr. Mardelle Matte at [place]

## 2019-10-25 NOTE — ED Provider Notes (Signed)
[facility]    CSN: [id number] Arrival date & time: 10/25/19  1305      History   Chief Complaint Chief Complaint  Patient presents with  . Fall    HPI Veronica Stewart is a 64 y.o. female.   HPI   Patient states that she was walking outdoors yesterday.  She heard some dogs barking and it alarmed her.  She turned around quickly to see where the dogs were, lost her balance and fell.  She landed over on her left hip.  She landed on concrete.  Her hip is very painful.  She cannot put weight on it today.  She states that she has fractured this hip previously.  She states it hurts as bad as it did when she was fractured. Patient is quite thin.  She appears in poor health. She states she is taking her blood pressure medication and her COPD inhalers.  She does continue to smoke cigarettes She has a history of alcoholism.  She states alcohol did not contribute to her fall last night.  Past Medical History:  Diagnosis Date  . Asthma   . Hypertension     Patient Active Problem List   Diagnosis Date Noted  . Post-operative pain   . Acute blood loss anemia   . Displaced intertrochanteric fracture of left femur, initial encounter for closed fracture ([place]) 03/18/2018  . Hypertension 03/18/2018  . Alcohol dependence with acute alcoholic intoxication ([place]) 03/18/2018  . Hyponatremia 03/18/2018  . Asthma 03/18/2018  . Protein-calorie malnutrition, severe 03/18/2018    Past Surgical History:  Procedure Laterality Date  . INTRAMEDULLARY (IM) NAIL INTERTROCHANTERIC Left 03/18/2018   Procedure: INTRAMEDULLARY (IM) NAIL INTERTROCHANTRIC;  Surgeon: Shona Needles, MD;  Location: [facility];  Service: Orthopedics;  Laterality: Left;    OB History   No obstetric history on file.      Home Medications    Prior to Admission medications   Medication Sig Start Date End Date Taking? Authorizing Provider  albuterol (VENTOLIN HFA) 108 (90 Base) MCG/ACT inhaler INHALE 2 PUFFS BY MOUTH  EVERY 6 HOURS AS NEEDED FOR WHEEZING AND SHORTNESS OF BREATH 06/26/19  Yes Kerin Perna, NP  amLODipine (NORVASC) 10 MG tablet Take 1 tablet (10 mg total) by mouth daily. 06/26/19  Yes Kerin Perna, NP  atenolol (TENORMIN) 50 MG tablet Take 1 tablet (50 mg total) by mouth daily. 06/26/19  Yes Kerin Perna, NP  cholecalciferol 2000 units TABS Take 1 tablet (2,000 Units total) by mouth daily. 03/22/18  Yes Bonnielee Haff, MD  Fluticasone-Salmeterol (ADVAIR) 100-50 MCG/DOSE AEPB Inhale 1 puff into the lungs 2 (two) times daily. 07/09/19  Yes Kerin Perna, NP  lisinopril-hydrochlorothiazide (ZESTORETIC) 20-25 MG tablet Take 1 tablet by mouth daily. 06/26/19  Yes Kerin Perna, NP  traMADol (ULTRAM) 50 MG tablet Take 1 tablet (50 mg total) by mouth every 6 (six) hours as needed. 10/25/19   Raylene Everts, MD    Family History Family History  Problem Relation Age of Onset  . Sudden Cardiac Death Neg Hx     Social History Social History   Tobacco Use  . Smoking status: Current Every Day Smoker    Packs/day: 0.50    Types: Cigarettes  . Smokeless tobacco: Never Used  Substance Use Topics  . Alcohol use: Yes    Comment: two 40 oz beers most days   . Drug use: Yes    Types: Marijuana     Allergies  Patient has no known allergies.   Review of Systems Review of Systems  Musculoskeletal: Positive for arthralgias and gait problem.    Physical Exam Triage Vital Signs ED Triage Vitals  Enc Vitals Group     BP 10/25/19 1337 98/61     Pulse Rate 10/25/19 1337 78     Resp 10/25/19 1337 16     Temp 10/25/19 1337 98.1 F (36.7 C)     Temp Source 10/25/19 1337 Oral     SpO2 10/25/19 1337 97 %     Weight --      Height --      Head Circumference --      Peak Flow --      Pain Score 10/25/19 1354 0     Pain Loc --      Pain Edu? --      Excl. in [place]? --    No data found.  Updated Vital Signs BP 98/61 (BP Location: Left Arm)   Pulse 78    Temp 98.1 F (36.7 C) (Oral)   Resp 16   SpO2 97%      Physical Exam Constitutional:      General: She is not in acute distress.    Appearance: She is well-developed.     Comments: Patient is thin, cachectic in appearance.  Mildly disheveled.  Bedbugs noted walking on clothing  HENT:     Head: Normocephalic and atraumatic.     Mouth/Throat:     Comments: Mask in place Eyes:     Conjunctiva/sclera: Conjunctivae normal.     Pupils: Pupils are equal, round, and reactive to light.  Cardiovascular:     Rate and Rhythm: Normal rate.  Pulmonary:     Effort: Pulmonary effort is normal. No respiratory distress.  Musculoskeletal:        General: Normal range of motion.     Cervical back: Normal range of motion.     Comments: Patient resists any range of motion of her left hip.  She is examined sitting in a wheelchair.  Patient states unable to stand.  Skin:    General: Skin is warm and dry.  Neurological:     Mental Status: She is alert.      UC Treatments / Results  Labs (all labs ordered are listed, but only abnormal results are displayed) Labs Reviewed - No data to display  EKG   Radiology DG Hip Unilat With Pelvis 2-3 Views Left  Result Date: 10/25/2019 CLINICAL DATA:  Fall last night. Unable to bear weight on left leg. EXAM: DG HIP (WITH OR WITHOUT PELVIS) 2-3V LEFT COMPARISON:  Radiograph 03/28/2018 FINDINGS: Intramedullary rod with distal locking trans trochanteric screw traverses remote intertrochanteric femur fracture. Previous fracture is healed. No periprosthetic lucency or acute periprosthetic fracture. Minimally displaced acute fracture of the left superior and inferior pubic rami, superior ramus fracture is at the pubic symphyseal junction. No symphyseal offset. Irregularity and osseous excrescence about the right lesser trochanter is chronic and stable from 01/05/2004 pelvis CT. Vascular calcifications and probable calcified uterine fibroids in the pelvis. IMPRESSION:  Acute minimally displaced left superior and inferior pubic rami fractures. Electronically Signed   By: Keith Rake M.D.   On: 10/25/2019 14:53    Procedures Procedures (including critical care time)  Medications Ordered in UC Medications  HYDROcodone-acetaminophen (NORCO/VICODIN) 5-325 MG per tablet 1 tablet (has no administration in time range)    Initial Impression / Assessment and Plan / UC Course  I have  reviewed the triage vital signs and the nursing notes.  Pertinent labs & imaging results that were available during my care of the patient were reviewed by me and considered in my medical decision making (see chart for details).     Discussed with Dr Mardelle Matte on call for orthopedics.  He will see in follow up. Final Clinical Impressions(s) / UC Diagnoses   Final diagnoses:  Pain  Fall  Inferior pubic ramus fracture, left, closed, initial encounter ([place])  Closed fracture of superior ramus of left pubis, initial encounter [facility])     Discharge Instructions     Walk as little as possible Use your walker when walking around.  Put light weight on the left leg. Take Tylenol as needed for moderate pain If pain is severe you may take a tramadol Do not take tramadol and drink any alcohol as it may increase risk of falls and drowsiness Make sure your family is at home when you take pain medicine Call for a follow-up with Dr. Mardelle Matte at [place]    ED Prescriptions    Medication Sig [place]. Provider   traMADol (ULTRAM) 50 MG tablet Take 1 tablet (50 mg total) by mouth every 6 (six) hours as needed. 15 tablet Raylene Everts, MD     I have reviewed the PDMP during this encounter.   Raylene Everts, MD 10/25/19 [phone]

## 2019-10-25 NOTE — ED Triage Notes (Signed)
fell on left hip last night, reports she cannot bear weight

## 2019-12-03 ENCOUNTER — Telehealth (INDEPENDENT_AMBULATORY_CARE_PROVIDER_SITE_OTHER): Payer: Self-pay

## 2019-12-03 NOTE — Telephone Encounter (Signed)
Patient called to make a medication refill for  albuterol (VENTOLIN HFA) 108 (90 Base) MCG/ACT inhaler   amLODipine (NORVASC) 10 MG tablet   atenolol (TENORMIN) 50 MG tablet   lisinopril-hydrochlorothiazide (ZESTORETIC) 20-25 MG tablet   Patient uses   [place], [place]   Please advice [phone]

## 2019-12-06 ENCOUNTER — Other Ambulatory Visit (INDEPENDENT_AMBULATORY_CARE_PROVIDER_SITE_OTHER): Payer: Self-pay | Admitting: Primary Care

## 2019-12-06 DIAGNOSIS — I10 Essential (primary) hypertension: Secondary | ICD-10-CM

## 2019-12-06 MED ORDER — FLUTICASONE-SALMETEROL 100-50 MCG/DOSE IN AEPB: 1.0000 | INHALATION_SPRAY | Freq: Two times a day (BID) | RESPIRATORY_TRACT | 3 refills | Status: AC

## 2019-12-06 MED ORDER — AMLODIPINE BESYLATE 10 MG PO TABS: 10.0000 mg | ORAL_TABLET | Freq: Every day | ORAL | 1 refills | Status: DC

## 2019-12-06 MED ORDER — ATENOLOL 50 MG PO TABS: 50.0000 mg | ORAL_TABLET | Freq: Every day | ORAL | 1 refills | Status: DC

## 2019-12-06 MED ORDER — ALBUTEROL SULFATE HFA 108 (90 BASE) MCG/ACT IN AERS: INHALATION_SPRAY | RESPIRATORY_TRACT | 2 refills | Status: DC

## 2019-12-24 ENCOUNTER — Ambulatory Visit (INDEPENDENT_AMBULATORY_CARE_PROVIDER_SITE_OTHER): Payer: Medicaid Other | Admitting: Primary Care

## 2019-12-24 ENCOUNTER — Other Ambulatory Visit: Payer: Self-pay

## 2019-12-24 ENCOUNTER — Encounter (INDEPENDENT_AMBULATORY_CARE_PROVIDER_SITE_OTHER): Payer: Self-pay | Admitting: Primary Care

## 2019-12-24 VITALS — BP 98/69 | HR 82 | Temp 97.3°F | Resp 16 | Ht 69.0 in | Wt 101.0 lb

## 2019-12-24 DIAGNOSIS — R2681 Unsteadiness on feet: Secondary | ICD-10-CM | POA: Diagnosis not present

## 2019-12-24 DIAGNOSIS — Z681 Body mass index (BMI) 19 or less, adult: Secondary | ICD-10-CM

## 2019-12-24 DIAGNOSIS — I10 Essential (primary) hypertension: Secondary | ICD-10-CM | POA: Diagnosis not present

## 2019-12-24 MED ORDER — ENSURE ACTIVE HIGH PROTEIN PO LIQD: 237.0000 [oz_av] | Freq: Two times a day (BID) | ORAL | 10 refills | Status: DC

## 2019-12-24 NOTE — Progress Notes (Signed)
Follow up BP Pelvis pain post fall  In March. Seen by ortho Pain 8/10- takes Tylenol and a few pain medication that was given to her by mother. Does not have anymore Tramadol.  Out of lisinopril- HCTZ since April.  Left foot discomfort. Wearing a sock b/c sneaker is uncomfortable.

## 2019-12-24 NOTE — Patient Instructions (Signed)
STOP TAKING ALL BLOOD PRESSURE MEDICATION UNTIL FOLLOW UP   Hypotension As your heart beats, it forces blood through your body. This force is called blood pressure. If you have hypotension, you have low blood pressure. When your blood pressure is too low, you may not get enough blood to your brain or other parts of your body. This may cause you to feel weak, light-headed, have a fast heartbeat, or even pass out (faint). Low blood pressure may be harmless, or it may cause serious problems. What are the causes?  Blood loss.  Not enough water in the body (dehydration).  Heart problems.  Hormone problems.  Pregnancy.  A very bad infection.  Not having enough of certain nutrients.  Very bad allergic reactions.  Certain medicines. What increases the risk?  Age. The risk increases as you get older.  Conditions that affect the heart or the brain and spinal cord (central nervous system).  Taking certain medicines.  Being pregnant. What are the signs or symptoms?  Feeling: ? Weak. ? Light-headed. ? Dizzy. ? Tired (fatigued).  Blurred vision.  Fast heartbeat.  Passing out, in very bad cases. How is this treated?  Changing your diet. This may involve eating more salt (sodium) or drinking more water.  Taking medicines to raise your blood pressure.  Changing how much you take (the dosage) of some of your medicines.  Wearing compression stockings. These stockings help to prevent blood clots and reduce swelling in your legs. In some cases, you may need to go to the hospital for:  Fluid replacement. This means you will receive fluids through an IV tube.  Blood replacement. This means you will receive donated blood through an IV tube (transfusion).  Treating an infection or heart problems, if this applies.  Monitoring. You may need to be monitored while medicines that you are taking wear off. Follow these instructions at home: Eating and drinking   Drink enough  fluids to keep your pee (urine) pale yellow.  Eat a healthy diet. Follow instructions from your doctor about what you can eat or drink. A healthy diet includes: ? Fresh fruits and vegetables. ? Whole grains. ? Low-fat (lean) meats. ? Low-fat dairy products.  Eat extra salt only as told. Do not add extra salt to your diet unless your doctor tells you to.  Eat small meals often.  Avoid standing up quickly after you eat. Medicines  Take over-the-counter and prescription medicines only as told by your doctor. ? Follow instructions from your doctor about changing how much you take of your medicines, if this applies. ? Do not stop or change any of your medicines on your own. General instructions   Wear compression stockings as told by your doctor.  Get up slowly from lying down or sitting.  Avoid hot showers and a lot of heat as told by your doctor.  Return to your normal activities as told by your doctor. Ask what activities are safe for you.  Do not use any products that contain nicotine or tobacco, such as cigarettes, e-cigarettes, and chewing tobacco. If you need help quitting, ask your doctor.  Keep all follow-up visits as told by your doctor. This is important. Contact a doctor if:  You throw up (vomit).  You have watery poop (diarrhea).  You have a fever for more than 2-3 days.  You feel more thirsty than normal.  You feel weak and tired. Get help right away if:  You have chest pain.  You have a fast  or uneven heartbeat.  You lose feeling (have numbness) in any part of your body.  You cannot move your arms or your legs.  You have trouble talking.  You get sweaty or feel light-headed.  You pass out.  You have trouble breathing.  You have trouble staying awake.  You feel mixed up (confused). Summary  Hypotension is also called low blood pressure. It is when the force of blood pumping through your arteries is too weak.  Hypotension may be harmless, or  it may cause serious problems.  Treatment may include changing your diet and medicines, and wearing compression stockings.  In very bad cases, you may need to go to the hospital. This information is not intended to replace advice given to you by your health care provider. Make sure you discuss any questions you have with your health care provider. Document Revised: 01/26/2018 Document Reviewed: 01/26/2018 Elsevier Patient Education  [place].

## 2019-12-24 NOTE — Progress Notes (Signed)
Established Patient Office Visit  Subjective:  Patient ID: Veronica Stewart, female    DOB: 1956/02/07  Age: 64 y.o. MRN: [id number]  CC: No chief complaint on file.   HPI Veronica Stewart presents for is a 64 year old African American female with a history of hypertension  Bp today is hypotensive, she continues to smoke 1/2 ppd, she fell in March followed by ortho requesting something for pain deferred to ortho.  Past Medical History:  Diagnosis Date  . Asthma   . Hypertension     Past Surgical History:  Procedure Laterality Date  . INTRAMEDULLARY (IM) NAIL INTERTROCHANTERIC Left 03/18/2018   Procedure: INTRAMEDULLARY (IM) NAIL INTERTROCHANTRIC;  Surgeon: Shona Needles, MD;  Location: [facility];  Service: Orthopedics;  Laterality: Left;    Family History  Problem Relation Age of Onset  . Sudden Cardiac Death Neg Hx     Social History   Socioeconomic History  . Marital status: Legally Separated    Spouse name: Not on file  . Number of children: Not on file  . Years of education: Not on file  . Highest education level: Not on file  Occupational History  . Not on file  Tobacco Use  . Smoking status: Current Every Day Smoker    Packs/day: 0.50    Types: Cigarettes  . Smokeless tobacco: Never Used  Substance and Sexual Activity  . Alcohol use: Yes    Comment: two 40 oz beers most days   . Drug use: Yes    Types: Marijuana  . Sexual activity: Not on file  Other Topics Concern  . Not on file  Social History Narrative  . Not on file   Social Determinants of Health   Financial Resource Strain:   . Difficulty of Paying Living Expenses:   Food Insecurity:   . Worried About Charity fundraiser in the Last Year:   . Arboriculturist in the Last Year:   Transportation Needs:   . Film/video editor (Medical):   Marland Kitchen Lack of Transportation (Non-Medical):   Physical Activity:   . Days of Exercise per Week:   . Minutes of Exercise per Session:   Stress:   . Feeling of  Stress :   Social Connections:   . Frequency of Communication with Friends and Family:   . Frequency of Social Gatherings with Friends and Family:   . Attends Religious Services:   . Active Member of Clubs or Organizations:   . Attends Archivist Meetings:   Marland Kitchen Marital Status:   Intimate Partner Violence:   . Fear of Current or Ex-Partner:   . Emotionally Abused:   Marland Kitchen Physically Abused:   . Sexually Abused:     Outpatient Medications Prior to Visit  Medication Sig Dispense Refill  . albuterol (VENTOLIN HFA) 108 (90 Base) MCG/ACT inhaler INHALE 2 PUFFS BY MOUTH EVERY 6 HOURS AS NEEDED FOR WHEEZING AND SHORTNESS OF BREATH 8.5 g 2  . cholecalciferol 2000 units TABS Take 1 tablet (2,000 Units total) by mouth daily. 30 tablet 1  . Fluticasone-Salmeterol (ADVAIR) 100-50 MCG/DOSE AEPB Inhale 1 puff into the lungs 2 (two) times daily. 1 each 3  . amLODipine (NORVASC) 10 MG tablet Take 1 tablet (10 mg total) by mouth daily. 90 tablet 1  . atenolol (TENORMIN) 50 MG tablet Take 1 tablet (50 mg total) by mouth daily. 90 tablet 1  . traMADol (ULTRAM) 50 MG tablet Take 1 tablet (50 mg total) by mouth  every 6 (six) hours as needed. (Patient not taking: Reported on 12/24/2019) 15 tablet 0  . lisinopril-hydrochlorothiazide (ZESTORETIC) 20-25 MG tablet Take 1 tablet by mouth daily. (Patient not taking: Reported on 12/24/2019) 90 tablet 0   Facility-Administered Medications Prior to Visit  Medication Dose Route Frequency Provider Last Rate Last Admin  . albuterol (PROVENTIL) (2.5 MG/3ML) 0.083% nebulizer solution 2.5 mg  2.5 mg Nebulization Once Kerin Perna, NP        No Known Allergies  ROS Review of Systems  Musculoskeletal: Positive for gait problem.       Left side from hip to foot aching throbbing pain 7/10  All other systems reviewed and are negative.     Objective:    Physical Exam  Constitutional: She is oriented to person, place, and time.  Frail/thin female   Cardiovascular: Normal rate and regular rhythm.  Pulmonary/Chest: Effort normal and breath sounds normal.  Abdominal: Soft. Bowel sounds are normal.  Musculoskeletal:     Cervical back: Normal range of motion.     Comments: Decrease ROM on left side weight increase pain  Neurological: She is oriented to person, place, and time.  Psychiatric: She has a normal mood and affect. Her behavior is normal.    BP 98/69   Pulse 82   Temp (!) 97.3 F (36.3 C)   Resp 16   Ht 5\' 9"  (1.753 m)   Wt 101 lb (45.8 kg)   SpO2 99%   BMI 14.92 kg/m  Wt Readings from Last 3 Encounters:  12/24/19 101 lb (45.8 kg)  06/26/19 114 lb (51.7 kg)  03/26/19 114 lb 12.8 oz (52.1 kg)     Health Maintenance Due  Topic Date Due  . Hepatitis C Screening  Never done  . COVID-19 Vaccine (1) Never done  . PAP SMEAR-Modifier  Never done  . MAMMOGRAM  Never done  . COLONOSCOPY  Never done    There are no preventive care reminders to display for this patient.  No results found for: TSH Lab Results  Component Value Date   WBC 6.2 03/26/2019   HGB 14.6 03/26/2019   HCT 44.8 03/26/2019   MCV 92 03/26/2019   PLT 340 03/26/2019   Lab Results  Component Value Date   NA 132 (L) 03/26/2019   K 4.4 03/26/2019   CO2 24 03/26/2019   GLUCOSE 78 03/26/2019   BUN 10 03/26/2019   CREATININE 0.96 03/26/2019   BILITOT 0.3 03/26/2019   ALKPHOS 73 03/26/2019   AST 17 03/26/2019   ALT 9 03/26/2019   PROT 7.5 03/26/2019   ALBUMIN 4.6 03/26/2019   CALCIUM 9.7 03/26/2019   ANIONGAP 8 03/21/2018   Lab Results  Component Value Date   CHOL 173 03/26/2019   Lab Results  Component Value Date   HDL 102 03/26/2019   Lab Results  Component Value Date   LDLCALC 48 03/26/2019   Lab Results  Component Value Date   TRIG 114 03/26/2019   Lab Results  Component Value Date   CHOLHDL 1.7 03/26/2019   Lab Results  Component Value Date   HGBA1C 5.2 03/26/2019      Assessment & Plan:  Diagnoses and all  orders for this visit:  Benign hypertension Counseled on blood pressure goal of less than 130/80, low-sodium, DASH diet, medication compliance, 150 minutes of moderate intensity exercise per week. Discussed medication compliance, adverse effects.  Body mass index (BMI) less than 16.5 Needs supplemental feeding Body mass index is 14.92  kg/m. Recommended  ENSURE ACTIVE HIGH PROTEIN)   Unstable gait Status post fall 3/11/2021she  lost her balance and fell.  She landed over on her left hip .  on concrete. Being frail and continue to have hip pain causing unstable gait . Recommend to use walker or device can to help with stability  Other orders -     Nutritional Supplements (ENSURE ACTIVE HIGH PROTEIN) LIQD; Take 237 oz by mouth 2 (two) times daily.    Meds ordered this encounter  Medications  . Nutritional Supplements (ENSURE ACTIVE HIGH PROTEIN) LIQD    Sig: Take 237 oz by mouth 2 (two) times daily.    Dispense:  2964 mL    Refill:  10    Follow-up: Return in about 2 months (around 02/23/2020) for BP check in person.    Kerin Perna, NP

## 2020-01-07 ENCOUNTER — Other Ambulatory Visit: Payer: Self-pay

## 2020-01-07 ENCOUNTER — Ambulatory Visit (INDEPENDENT_AMBULATORY_CARE_PROVIDER_SITE_OTHER): Payer: Medicaid Other | Admitting: Primary Care

## 2020-01-07 ENCOUNTER — Encounter (INDEPENDENT_AMBULATORY_CARE_PROVIDER_SITE_OTHER): Payer: Self-pay | Admitting: Primary Care

## 2020-01-07 VITALS — BP 110/77 | HR 79 | Temp 97.5°F | Ht 70.0 in | Wt 102.0 lb

## 2020-01-07 DIAGNOSIS — Z1231 Encounter for screening mammogram for malignant neoplasm of breast: Secondary | ICD-10-CM

## 2020-01-07 DIAGNOSIS — M7989 Other specified soft tissue disorders: Secondary | ICD-10-CM

## 2020-01-07 DIAGNOSIS — I10 Essential (primary) hypertension: Secondary | ICD-10-CM | POA: Diagnosis not present

## 2020-01-07 DIAGNOSIS — Z1211 Encounter for screening for malignant neoplasm of colon: Secondary | ICD-10-CM | POA: Diagnosis not present

## 2020-01-07 NOTE — Patient Instructions (Signed)

## 2020-01-07 NOTE — Progress Notes (Signed)
Established Patient Office Visit  Subjective:  Patient ID: Veronica Stewart, female    DOB: 03/02/1956  Age: 64 y.o. MRN: [id number]  CC:  Chief Complaint  Patient presents with  . Blood Pressure Check    HPI [name] presents for blood pressure follow up. Previous visit Bp was low and stopped all Bp medication. Today she is 110/77. Denies shortness of breath, headaches, chest pain or lower extremity edema, sudden onset, vision changes, unilateral weakness, dizziness, paresthesias  Past Medical History:  Diagnosis Date  . Asthma   . Hypertension     Past Surgical History:  Procedure Laterality Date  . INTRAMEDULLARY (IM) NAIL INTERTROCHANTERIC Left 03/18/2018   Procedure: INTRAMEDULLARY (IM) NAIL INTERTROCHANTRIC;  Surgeon: Shona Needles, MD;  Location: [facility];  Service: Orthopedics;  Laterality: Left;    Family History  Problem Relation Age of Onset  . Sudden Cardiac Death Neg Hx     Social History   Socioeconomic History  . Marital status: Legally Separated    Spouse name: Not on file  . Number of children: Not on file  . Years of education: Not on file  . Highest education level: Not on file  Occupational History  . Not on file  Tobacco Use  . Smoking status: Current Every Day Smoker    Packs/day: 0.50    Types: Cigarettes  . Smokeless tobacco: Never Used  Substance and Sexual Activity  . Alcohol use: Yes    Comment: two 40 oz beers most days   . Drug use: Yes    Types: Marijuana  . Sexual activity: Not on file  Other Topics Concern  . Not on file  Social History Narrative  . Not on file   Social Determinants of Health   Financial Resource Strain:   . Difficulty of Paying Living Expenses:   Food Insecurity:   . Worried About Charity fundraiser in the Last Year:   . Arboriculturist in the Last Year:   Transportation Needs:   . Film/video editor (Medical):   Marland Kitchen Lack of Transportation (Non-Medical):   Physical Activity:   . Days of  Exercise per Week:   . Minutes of Exercise per Session:   Stress:   . Feeling of Stress :   Social Connections:   . Frequency of Communication with Friends and Family:   . Frequency of Social Gatherings with Friends and Family:   . Attends Religious Services:   . Active Member of Clubs or Organizations:   . Attends Archivist Meetings:   Marland Kitchen Marital Status:   Intimate Partner Violence:   . Fear of Current or Ex-Partner:   . Emotionally Abused:   Marland Kitchen Physically Abused:   . Sexually Abused:     Outpatient Medications Prior to Visit  Medication Sig Dispense Refill  . albuterol (VENTOLIN HFA) 108 (90 Base) MCG/ACT inhaler INHALE 2 PUFFS BY MOUTH EVERY 6 HOURS AS NEEDED FOR WHEEZING AND SHORTNESS OF BREATH 8.5 g 2  . Fluticasone-Salmeterol (ADVAIR) 100-50 MCG/DOSE AEPB Inhale 1 puff into the lungs 2 (two) times daily. 1 each 3  . Nutritional Supplements (ENSURE ACTIVE HIGH PROTEIN) LIQD Take 237 oz by mouth 2 (two) times daily. 2964 mL 10  . Vitamin D, Ergocalciferol, (DRISDOL) 1.25 MG (50000 UNIT) CAPS capsule Take 50,000 Units by mouth 2 (two) times a week.    . cholecalciferol 2000 units TABS Take 1 tablet (2,000 Units total) by mouth daily. (Patient not taking:  Reported on 01/07/2020) 30 tablet 1  . traMADol (ULTRAM) 50 MG tablet Take 1 tablet (50 mg total) by mouth every 6 (six) hours as needed. (Patient not taking: Reported on 12/24/2019) 15 tablet 0   Facility-Administered Medications Prior to Visit  Medication Dose Route Frequency Provider Last Rate Last Admin  . albuterol (PROVENTIL) (2.5 MG/3ML) 0.083% nebulizer solution 2.5 mg  2.5 mg Nebulization Once Juluis Mire P, NP        No Known Allergies  ROS Review of Systems  Skin: Positive for wound.       Foot swelling   All other systems reviewed and are negative.     Objective:    Physical Exam  Constitutional: She is oriented to person, place, and time.  Cachetic   HENT:  Head: Normocephalic.   Cardiovascular: Normal rate and regular rhythm.  Pulmonary/Chest: Effort normal and breath sounds normal.  Abdominal: Bowel sounds are normal.  Musculoskeletal:     Cervical back: Normal range of motion and neck supple.  Neurological: She is oriented to person, place, and time.  Skin: Skin is warm and dry.  Psychiatric: She has a normal mood and affect. Her behavior is normal. Judgment and thought content normal.    BP 110/77 (BP Location: Left Arm, Patient Position: Sitting, Cuff Size: Small)   Pulse 79   Temp (!) 97.5 F (36.4 C) (Temporal)   Ht 5\' 10"  (1.778 m)   Wt 102 lb (46.3 kg)   SpO2 97%   BMI 14.64 kg/m  Wt Readings from Last 3 Encounters:  01/07/20 102 lb (46.3 kg)  12/24/19 101 lb (45.8 kg)  06/26/19 114 lb (51.7 kg)     Health Maintenance Due  Topic Date Due  . Hepatitis C Screening  Never done  . COVID-19 Vaccine (1) Never done  . PAP SMEAR-Modifier  Never done  . MAMMOGRAM  Never done  . COLONOSCOPY  Never done    There are no preventive care reminders to display for this patient.  No results found for: TSH Lab Results  Component Value Date   WBC 6.2 03/26/2019   HGB 14.6 03/26/2019   HCT 44.8 03/26/2019   MCV 92 03/26/2019   PLT 340 03/26/2019   Lab Results  Component Value Date   NA 132 (L) 03/26/2019   K 4.4 03/26/2019   CO2 24 03/26/2019   GLUCOSE 78 03/26/2019   BUN 10 03/26/2019   CREATININE 0.96 03/26/2019   BILITOT 0.3 03/26/2019   ALKPHOS 73 03/26/2019   AST 17 03/26/2019   ALT 9 03/26/2019   PROT 7.5 03/26/2019   ALBUMIN 4.6 03/26/2019   CALCIUM 9.7 03/26/2019   ANIONGAP 8 03/21/2018   Lab Results  Component Value Date   CHOL 173 03/26/2019   Lab Results  Component Value Date   HDL 102 03/26/2019   Lab Results  Component Value Date   LDLCALC 48 03/26/2019   Lab Results  Component Value Date   TRIG 114 03/26/2019   Lab Results  Component Value Date   CHOLHDL 1.7 03/26/2019   Lab Results  Component Value Date    HGBA1C 5.2 03/26/2019      Assessment & Plan:  Veronica Stewart was seen today for blood pressure check.  Diagnoses and all orders for this visit:  Benign hypertension Stopped all blood pressure medication . Manage with low sodium diet. Include exercising    Encounter for screening mammogram for malignant neoplasm of breast Mammogram are recommended for women age 31.  This is designed for women who are at average risk for breast cancer most beneficial screening  mammography  this may reduce the risk for breast cancer death. -     MM Digital Screening; Future  Colon cancer screening Normal colon cancer screening.  CDC recommends colorectal screening from ages 35-75.  -     Ambulatory referral to Gastroenterology  Swelling of left foot From applying more presure on right side of body causing increase weight and stress on the left side   No orders of the defined types were placed in this encounter.   Follow-up: Return for pap.    Kerin Perna, NP

## 2020-01-31 ENCOUNTER — Telehealth (INDEPENDENT_AMBULATORY_CARE_PROVIDER_SITE_OTHER): Payer: Self-pay

## 2020-01-31 NOTE — Telephone Encounter (Signed)
DR. Ramon Dredge from [facility] called requesting to speak with PCP in regards to patient. Dr. Hal Morales was advice PCP was out of the office and could leave a message. Dr. 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   Please referral patient if appropriate

## 2020-01-31 NOTE — Telephone Encounter (Signed)
Sent to PCP ?

## 2020-02-08 ENCOUNTER — Other Ambulatory Visit (INDEPENDENT_AMBULATORY_CARE_PROVIDER_SITE_OTHER): Payer: Self-pay | Admitting: Primary Care

## 2020-02-08 DIAGNOSIS — M879 Osteonecrosis, unspecified: Secondary | ICD-10-CM

## 2020-02-13 ENCOUNTER — Ambulatory Visit (INDEPENDENT_AMBULATORY_CARE_PROVIDER_SITE_OTHER): Payer: Medicaid Other

## 2020-02-13 ENCOUNTER — Other Ambulatory Visit: Payer: Self-pay

## 2020-02-13 ENCOUNTER — Ambulatory Visit: Payer: Medicaid Other | Admitting: Podiatry

## 2020-02-13 DIAGNOSIS — M79672 Pain in left foot: Secondary | ICD-10-CM | POA: Diagnosis not present

## 2020-02-13 DIAGNOSIS — M7662 Achilles tendinitis, left leg: Secondary | ICD-10-CM | POA: Diagnosis not present

## 2020-02-13 MED ORDER — DOXYCYCLINE HYCLATE 100 MG PO TABS: 100.0000 mg | ORAL_TABLET | Freq: Two times a day (BID) | ORAL | 0 refills | Status: DC

## 2020-02-13 MED ORDER — MUPIROCIN 2 % EX OINT
1.0000 | TOPICAL_OINTMENT | Freq: Two times a day (BID) | CUTANEOUS | 2 refills | Status: DC
Start: 2020-02-13 — End: 2021-02-12

## 2020-02-21 ENCOUNTER — Ambulatory Visit (INDEPENDENT_AMBULATORY_CARE_PROVIDER_SITE_OTHER): Payer: Medicaid Other | Admitting: Podiatry

## 2020-02-21 ENCOUNTER — Other Ambulatory Visit: Payer: Self-pay

## 2020-02-21 ENCOUNTER — Other Ambulatory Visit: Payer: Self-pay | Admitting: Podiatry

## 2020-02-21 ENCOUNTER — Encounter: Payer: Self-pay | Admitting: Podiatry

## 2020-02-21 DIAGNOSIS — S91215D Laceration without foreign body of left lesser toe(s) with damage to nail, subsequent encounter: Secondary | ICD-10-CM

## 2020-02-21 DIAGNOSIS — M7662 Achilles tendinitis, left leg: Secondary | ICD-10-CM

## 2020-02-21 DIAGNOSIS — I739 Peripheral vascular disease, unspecified: Secondary | ICD-10-CM

## 2020-02-21 DIAGNOSIS — M79672 Pain in left foot: Secondary | ICD-10-CM

## 2020-02-21 DIAGNOSIS — Z720 Tobacco use: Secondary | ICD-10-CM

## 2020-02-21 DIAGNOSIS — S91209D Unspecified open wound of unspecified toe(s) with damage to nail, subsequent encounter: Secondary | ICD-10-CM

## 2020-02-21 NOTE — Patient Instructions (Addendum)
Complete all the antibiotics as directed  Continue wearing the surgical shoe.   Apply betadine (iodine) ointment to the tips of the toes daily and allow to air dry. You may get this at the pharmacy over the counter in the first aid section  Monitor for any signs/symptoms of infection. Signs of an infection could be redness beyond the site of the incision/procedure/wound, foul smelling odor, drainage that is thick and yellow or green, or severe swelling and pain. Call the office immediately if any occur or go directly to the emergency room. Call with any questions/concerns.

## 2020-02-21 NOTE — Progress Notes (Signed)
  Subjective:  Patient ID: Veronica Stewart, female    DOB: 02-13-1956,  MRN: [id number]  Chief Complaint  Patient presents with  . Foot Ulcer    recheck left toes, still taking antibiotics    64 y.o. female presents with the above complaint. History confirmed with patient. She saw Dr Jacqualyn Posey last week, her toenail had been pulled off and she was having severe pain in the toes. Has been taking doxycycline and wearing surgical shoe. 1/2 ppd smoker for many years.  Objective:  Physical Exam: Foot is warm at the ankle with a temperature gradient to cool at the toes. Weakly palpable DP pulse on the left foot with a non palpable PT. Early darkening vs dry gangrenous changes noted. The 5th toe has been traumatically avulsed previously. No sign of acute infection noted Assessment:   1. Left foot pain   2. PAD (peripheral artery disease) ([facility])   3. Tobacco use   4. Claudication ([place])   5. Open wound of toe with avulsion of toenail, subsequent encounter      Plan:  Patient was evaluated and treated and all questions answered.  -Today gangrenous changes in her toes appear to be stable.  There is no sign of acute purulent infection.  She does have significant pain, I believe this is due to claudication.  The fifth toenail that was previously avulsed also remained stable with an ulceration that has a fibrotic wound bed.  No exposed bone at this time. -She is not diabetic but she is a heavy smoker for and has been for many years, and I suspect that she has significant arterial disease secondary to this.  Additionally she has a history of alcohol dependence and malnourishment that is severe, and I believe this with her at great risk for wound healing complications, infection, and limb loss.  We discussed all of this today as well. -She should continue her antibiotics -Continue weightbearing as tolerated in surgical shoe -We did perform a ankle-brachial index today in the office, however due to her  very thin and underweight body habitus, I do not think that the pressure cuff was able to get an accurate reading and received an error.  We will order bilateral ABI with TBI and a left lower extremity arterial ultrasound to be completed at the hospital -Referral to Dr. Servando Snare, MD of [facility] to follow-up on her arterial studies and claudication pain. -Discussed signs symptoms of infection, if these develop she should come to the emergency room or notify the office immediately.  Lanae Crumbly, DPM 02/21/2020    Return in about 2 weeks (around 03/06/2020) for with Dr Jacqualyn Posey to review tests and check toes.

## 2020-02-22 ENCOUNTER — Telehealth: Payer: Self-pay | Admitting: *Deleted

## 2020-02-22 DIAGNOSIS — S91209D Unspecified open wound of unspecified toe(s) with damage to nail, subsequent encounter: Secondary | ICD-10-CM

## 2020-02-22 DIAGNOSIS — I739 Peripheral vascular disease, unspecified: Secondary | ICD-10-CM

## 2020-02-22 DIAGNOSIS — M79672 Pain in left foot: Secondary | ICD-10-CM

## 2020-02-22 DIAGNOSIS — Z720 Tobacco use: Secondary | ICD-10-CM

## 2020-02-22 NOTE — Telephone Encounter (Signed)
I spoke with pt and informed that she needed to call Medicaid, because the new Medicaid did not have her in their system with the information our office currently had. Pt states she has new cards. I requested pt give [place] copies of the cards and she stated she will see if she can get them to our office.

## 2020-02-22 NOTE — Telephone Encounter (Signed)
-----   Message from Criselda Peaches, DPM sent at 02/21/2020  4:59 PM EDT ----- Regarding: Vascular studies and referral Hi Val,  Can you place a few orders for Mrs Radke:  ABI/TBI bilateral Arterial duplex US on the L lower extremity Referral to Dr Donzetta Matters at [facility]  Dx for the above is PAD and tobacco use disorder, open wound of left 5th toe

## 2020-02-22 NOTE — Telephone Encounter (Signed)
I informed [place], I was not able to find pt in [place] or through this automated system. Joycelyn Schmid put in pt's Medicaid [id number] S and pt did not come up, she checked another system but pt did not come up, so she instructed me to have pt call [place] Member# [id number] and inform the agent she is not in the new Medicaid system. I also gave Joycelyn Schmid pt's [facility] [phone], given to me by R. Durant - Surgery Coordinator. Joycelyn Schmid could not find in the [facility] system.

## 2020-02-24 NOTE — Progress Notes (Signed)
Subjective:   Patient ID: Veronica Stewart, female   DOB: 64 y.o.   MRN: [id number]   HPI 64 year old female presents the office today for concerns of discoloration to the tips of her toes as well as for calluses.  She is referred for PAD, wounds by [facility].  She states that she can try to soak her feet in Epson salts.  She does tenderness the tips of the toes. She states that the left 5th toenail came off on its own today. Denies any drainage or pus.  No fevers, chills, nausea, vomiting.   Review of Systems  All other systems reviewed and are negative.  Past Medical History:  Diagnosis Date  . Asthma   . Hypertension     Past Surgical History:  Procedure Laterality Date  . INTRAMEDULLARY (IM) NAIL INTERTROCHANTERIC Left 03/18/2018   Procedure: INTRAMEDULLARY (IM) NAIL INTERTROCHANTRIC;  Surgeon: Shona Needles, MD;  Location: [facility];  Service: Orthopedics;  Laterality: Left;     Current Outpatient Medications:  .  albuterol (VENTOLIN HFA) 108 (90 Base) MCG/ACT inhaler, INHALE 2 PUFFS BY MOUTH EVERY 6 HOURS AS NEEDED FOR WHEEZING AND SHORTNESS OF BREATH, Disp: 8.5 g, Rfl: 2 .  cholecalciferol 2000 units TABS, Take 1 tablet (2,000 Units total) by mouth daily., Disp: 30 tablet, Rfl: 1 .  Fluticasone-Salmeterol (ADVAIR) 100-50 MCG/DOSE AEPB, Inhale 1 puff into the lungs 2 (two) times daily., Disp: 1 each, Rfl: 3 .  Nutritional Supplements (ENSURE ACTIVE HIGH PROTEIN) LIQD, Take 237 oz by mouth 2 (two) times daily., Disp: 2964 mL, Rfl: 10 .  PROLIA 60 MG/ML SOSY injection, Inject into the skin as directed., Disp: , Rfl:  .  Vitamin D, Ergocalciferol, (DRISDOL) 1.25 MG (50000 UNIT) CAPS capsule, Take 50,000 Units by mouth 2 (two) times a week., Disp: , Rfl:  .  doxycycline (VIBRA-TABS) 100 MG tablet, Take 1 tablet (100 mg total) by mouth 2 (two) times daily., Disp: 20 tablet, Rfl: 0 .  mupirocin ointment (BACTROBAN) 2 %, Apply 1 application topically 2 (two) times daily.,  Disp: 30 g, Rfl: 2  Current Facility-Administered Medications:  .  albuterol (PROVENTIL) (2.5 MG/3ML) 0.083% nebulizer solution 2.5 mg, 2.5 mg, Nebulization, Once, [place], [employer], NP  No Known Allergies       Objective:  Physical Exam  General: AAO x3, NAD  Dermatological: Gangrenous changes present to the distal aspect of the toes on the left foot there is no drainage or pus.  Superficial wounds are present there is no probing to bone, undermining or tunneling.  There is no purulence.  She states that the left fifth toenail came off of them today.  The other nails are significantly thickened and discolored.      Vascular: I am unable to palpate pulses.  Neruologic: Sensation decreased with Thornell Mule monofilament  Musculoskeletal: Hammertoes present    Assessment:   PAD, chronic changes to the toes; superficial wounds; onychomycosis     Plan:  -Treatment options discussed including all alternatives, risks, and complications -Etiology of symptoms were discussed -I debrided the nails with any complications or bleeding.  Unfortunately she already has necrotic changes present this loss of the toes.  Will refer to vein and vascular for this.  There is no obvious signs of infection today but monitor closely. -Doxycycline more as a precaution.  -Surgical shoe  Trula Slade DPM

## 2020-02-25 NOTE — Telephone Encounter (Signed)
I spoke with pt and informed Dr. Jacqualyn Posey was referring to vascular and to have her insurance information ready, pt states understanding. [facility]axed referral as STAT to [facility] - DR. Donzetta Matters on required referral form.

## 2020-02-25 NOTE — Telephone Encounter (Signed)
-----   Message from Trula Slade, DPM sent at 02/25/2020  8:14 AM EDT ----- Yes, please put in as stat when you can. Thank you.  ----- Message ----- From: Andres Ege, RN Sent: 02/25/2020   7:58 AM EDT To: Trula Slade, DPM  Dr. Jacqualyn Posey, I can order pt as a stat referral, but I have to wait for her to give [place] the insurance information and I have her to be called again today to check her insurance status. Please advise. Marcy Siren ----- Message ----- From: Trula Slade, DPM Sent: 02/24/2020   9:21 AM EDT To: Andres Ege, RN  I see that there has been an issue with insurance but wanted to follow up to make sure that she is able to get in to see vascular? She needs to be seen ASAP. Thanks.

## 2020-02-27 ENCOUNTER — Telehealth: Payer: Self-pay | Admitting: Primary Care

## 2020-02-27 NOTE — Telephone Encounter (Signed)
   Kristel Durkee DOB: [birth year] MRN: [id number]   RIDER WAIVER AND RELEASE OF LIABILITY  For purposes of improving physical access to our facilities, [facility] is pleased to partner with third parties to provide [facility] patients or other authorized individuals the option of convenient, on-demand ground transportation services (the [place]") through use of the technology service that enables users to request on-demand ground transportation from independent third-party providers.  By opting to use and accept these [employer], I, the undersigned, hereby agree on behalf of myself, and on behalf of any minor child using the [employer] for whom I am the parent or legal guardian, as follows:  1. Government social research officer provided to me are provided by independent third-party transportation providers who are not [employer] or employees and who are unaffiliated with [employer]. 2. [facility] is neither a transportation carrier nor a common or public carrier. 3. [facility] has no control over the quality or safety of the transportation that occurs as a result of the [employer]. 4. [facility] cannot guarantee that any third-party transportation provider will complete any arranged transportation service. 5. [facility] makes no representation, warranty, or guarantee regarding the reliability, timeliness, quality, safety, suitability, or availability of any of the Transport Services or that they will be error free. 6. I fully understand that traveling by vehicle involves risks and dangers of serious bodily injury, including permanent disability, paralysis, and death. I agree, on behalf of myself and on behalf of any minor child using the Transport Services for whom I am the parent or legal guardian, that the entire risk arising out of my use of the [employer] remains solely with me, to the maximum extent permitted under applicable law. 7. The [employer] are provided "as is" and "as available." [facility] disclaims all representations and warranties, express, implied or statutory, not expressly set out in these terms, including the implied warranties of merchantability and fitness for a particular purpose. 8. I hereby waive and release [facility], its agents, employees, officers, directors, representatives, insurers, attorneys, assigns, successors, subsidiaries, and affiliates from any and all past, present, or future claims, demands, liabilities, actions, causes of action, or suits of any kind directly or indirectly arising from acceptance and use of the [employer]. 9. I further waive and release [facility] and its affiliates from all present and future liability and responsibility for any injury or death to persons or damages to property caused by or related to the use of the [employer]. 10. I have read this Waiver and Release of Liability, and I understand the terms used in it and their legal significance. This Waiver is freely and voluntarily given with the understanding that my right (as well as the right of any minor child for whom I am the parent or legal guardian using the [employer]) to legal recourse against  in connection with the [employer] is knowingly surrendered in return for use of these services.   I attest that I read the consent document to [name], gave Ms. Merendino the opportunity to ask questions and answered the questions asked (if any). I affirm that [name] then provided consent for she's participation in this program.     Legrand Pitts

## 2020-02-28 ENCOUNTER — Other Ambulatory Visit: Payer: Self-pay | Admitting: *Deleted

## 2020-02-28 DIAGNOSIS — M79606 Pain in leg, unspecified: Secondary | ICD-10-CM

## 2020-03-04 ENCOUNTER — Encounter: Payer: Self-pay | Admitting: Vascular Surgery

## 2020-03-04 ENCOUNTER — Ambulatory Visit (HOSPITAL_COMMUNITY)
Admission: RE | Admit: 2020-03-04 | Discharge: 2020-03-04 | Disposition: A | Discharge status: Home / Self Care | Payer: Medicaid Other | Source: Ambulatory Visit | Attending: Vascular Surgery | Admitting: Vascular Surgery

## 2020-03-04 ENCOUNTER — Other Ambulatory Visit: Payer: Self-pay

## 2020-03-04 ENCOUNTER — Ambulatory Visit (INDEPENDENT_AMBULATORY_CARE_PROVIDER_SITE_OTHER): Payer: Medicaid Other | Admitting: Vascular Surgery

## 2020-03-04 DIAGNOSIS — M79606 Pain in leg, unspecified: Secondary | ICD-10-CM | POA: Insufficient documentation

## 2020-03-04 DIAGNOSIS — I70229 Atherosclerosis of native arteries of extremities with rest pain, unspecified extremity: Secondary | ICD-10-CM | POA: Insufficient documentation

## 2020-03-04 DIAGNOSIS — I998 Other disorder of circulatory system: Secondary | ICD-10-CM

## 2020-03-04 NOTE — Progress Notes (Signed)
Patient name: Veronica Stewart MRN: [id number] DOB: 11/22/1955 Sex: female  REASON FOR CONSULT: Pain and tissue loss left foot  HPI: Veronica Stewart is a 64 y.o. female, with history of hypertension and tobacco abuse that presents for evaluation of PAD in the left lower extremity.  She describes having severe pain in the left foot all the time even at night since at least March of this year.  She has also had a left fifth toenail fall off with a small ulceration.  She has been followed by Dr. Earleen Newport with [facility] and ultimately he made a referral here to vascular surgery.  She states her right foot is doing fine and she is only having issues with the left foot.  She denies any other medical problems other than hypertension.  Does smoke half pack a day and has smoked her entire life.  Does walk with a walker following a fall with a hip fracture back in March.  No previous lower extremity revascularization.  Past Medical History:  Diagnosis Date  . Asthma   . Hypertension     Past Surgical History:  Procedure Laterality Date  . INTRAMEDULLARY (IM) NAIL INTERTROCHANTERIC Left 03/18/2018   Procedure: INTRAMEDULLARY (IM) NAIL INTERTROCHANTRIC;  Surgeon: Shona Needles, MD;  Location: [facility];  Service: Orthopedics;  Laterality: Left;    Family History  Problem Relation Age of Onset  . Sudden Cardiac Death Neg Hx     SOCIAL HISTORY: Social History   Socioeconomic History  . Marital status: Legally Separated    Spouse name: Not on file  . Number of children: Not on file  . Years of education: Not on file  . Highest education level: Not on file  Occupational History  . Not on file  Tobacco Use  . Smoking status: Current Every Day Smoker    Packs/day: 0.50    Types: Cigarettes  . Smokeless tobacco: Never Used  Substance and Sexual Activity  . Alcohol use: Yes    Comment: two 40 oz beers most days   . Drug use: Yes    Types: Marijuana  . Sexual activity: Not on  file  Other Topics Concern  . Not on file  Social History Narrative  . Not on file   Social Determinants of Health   Financial Resource Strain:   . Difficulty of Paying Living Expenses:   Food Insecurity:   . Worried About Charity fundraiser in the Last Year:   . Arboriculturist in the Last Year:   Transportation Needs:   . Film/video editor (Medical):   Marland Kitchen Lack of Transportation (Non-Medical):   Physical Activity:   . Days of Exercise per Week:   . Minutes of Exercise per Session:   Stress:   . Feeling of Stress :   Social Connections:   . Frequency of Communication with Friends and Family:   . Frequency of Social Gatherings with Friends and Family:   . Attends Religious Services:   . Active Member of Clubs or Organizations:   . Attends Archivist Meetings:   Marland Kitchen Marital Status:   Intimate Partner Violence:   . Fear of Current or Ex-Partner:   . Emotionally Abused:   Marland Kitchen Physically Abused:   . Sexually Abused:     No Known Allergies  Current Outpatient Medications  Medication Sig Dispense Refill  . albuterol (VENTOLIN HFA) 108 (90 Base) MCG/ACT inhaler INHALE 2 PUFFS BY MOUTH EVERY 6  HOURS AS NEEDED FOR WHEEZING AND SHORTNESS OF BREATH 8.5 g 2  . cholecalciferol 2000 units TABS Take 1 tablet (2,000 Units total) by mouth daily. 30 tablet 1  . Fluticasone-Salmeterol (ADVAIR) 100-50 MCG/DOSE AEPB Inhale 1 puff into the lungs 2 (two) times daily. 1 each 3  . mupirocin ointment (BACTROBAN) 2 % Apply 1 application topically 2 (two) times daily. 30 g 2  . Nutritional Supplements (ENSURE ACTIVE HIGH PROTEIN) LIQD Take 237 oz by mouth 2 (two) times daily. 2964 mL 10  . PROLIA 60 MG/ML SOSY injection Inject into the skin as directed.    . Vitamin D, Ergocalciferol, (DRISDOL) 1.25 MG (50000 UNIT) CAPS capsule Take 50,000 Units by mouth 2 (two) times a week.    . doxycycline (VIBRA-TABS) 100 MG tablet Take 1 tablet (100 mg total) by mouth 2 (two) times daily. (Patient  not taking: Reported on 03/04/2020) 20 tablet 0   Current Facility-Administered Medications  Medication Dose Route Frequency Provider Last Rate Last Admin  . albuterol (PROVENTIL) (2.5 MG/3ML) 0.083% nebulizer solution 2.5 mg  2.5 mg Nebulization Once Kerin Perna, NP        REVIEW OF SYSTEMS:  [X]  denotes positive finding, [ ]  denotes negative finding Cardiac  Comments:  Chest pain or chest pressure:    Shortness of breath upon exertion:    Short of breath when lying flat:    Irregular heart rhythm:        Vascular    Pain in calf, thigh, or hip brought on by ambulation:    Pain in feet at night that wakes you up from your sleep:     Blood clot in your veins:    Leg swelling:         Pulmonary    Oxygen at home:    Productive cough:     Wheezing:         Neurologic    Sudden weakness in arms or legs:     Sudden numbness in arms or legs:     Sudden onset of difficulty speaking or slurred speech:    Temporary loss of vision in one eye:     Problems with dizziness:         Gastrointestinal    Blood in stool:     Vomited blood:         Genitourinary    Burning when urinating:     Blood in urine:        Psychiatric    Major depression:         Hematologic    Bleeding problems:    Problems with blood clotting too easily:        Skin    Rashes or ulcers:        Constitutional    Fever or chills:      PHYSICAL EXAM: Vitals:   03/04/20 0900  BP: (!) 164/118  Pulse: 93  Resp: 16  Temp: (!) 97.2 F (36.2 C)  TempSrc: Temporal  SpO2: 100%  Weight: 102 lb (46.3 kg)  Height: 5\' 9"  (1.753 m)    GENERAL: The patient is a well-nourished female, in no acute distress. The vital signs are documented above. CARDIAC: There is a regular rate and rhythm.  VASCULAR:  Right femoral pulse palpable Left femoral pulse nonpalpable No palpable pedal pulses PULMONARY: There is good air exchange bilaterally without wheezing or rales. ABDOMEN: Soft and non-tender with  normal pitched bowel sounds.  MUSCULOSKELETAL: There are no major  deformities or cyanosis. NEUROLOGIC: No focal weakness or paresthesias are detected. SKIN: There are no ulcers or rashes noted. PSYCHIATRIC: The patient has a normal affect.      DATA:   ABIs today were 0.81 on the right monophasic and absent on the left  Assessment/Plan:  64 year old female with history of hypertension and tobacco abuse that presents for evaluation of critical limb ischemia with tissue loss in the left lower extremity.  She describes severe rest pain in the foot since at least March and now has a ulceration on her left fifth toe after toenail fell off.  Unable to obtain any ABIs in clinic today.  She does remain motor and sensory intact.  I recommended left lower extremity arteriogram with possible intervention tomorrow in the Cath Lab.  We discussed that she may ultimately require bypass if there are no endovascular options.  She is certainly at high risk for limb loss.  Risks and benefits discussed in detail.   Marty Heck, MD [facility] Office: [phone]

## 2020-03-05 ENCOUNTER — Encounter (HOSPITAL_COMMUNITY)
Admission: RE | Disposition: A | Discharge status: Home / Self Care | Payer: Self-pay | Source: Home / Self Care | Attending: Vascular Surgery

## 2020-03-05 ENCOUNTER — Other Ambulatory Visit: Payer: Self-pay

## 2020-03-05 ENCOUNTER — Ambulatory Visit (HOSPITAL_BASED_OUTPATIENT_CLINIC_OR_DEPARTMENT_OTHER): Payer: Medicaid Other

## 2020-03-05 ENCOUNTER — Ambulatory Visit (HOSPITAL_COMMUNITY)
Admission: RE | Admit: 2020-03-05 | Discharge: 2020-03-05 | Disposition: A | Discharge status: Home / Self Care | Payer: Medicaid Other | Attending: Vascular Surgery | Admitting: Vascular Surgery

## 2020-03-05 DIAGNOSIS — Z7951 Long term (current) use of inhaled steroids: Secondary | ICD-10-CM | POA: Insufficient documentation

## 2020-03-05 DIAGNOSIS — F17210 Nicotine dependence, cigarettes, uncomplicated: Secondary | ICD-10-CM | POA: Diagnosis not present

## 2020-03-05 DIAGNOSIS — I10 Essential (primary) hypertension: Secondary | ICD-10-CM | POA: Diagnosis not present

## 2020-03-05 DIAGNOSIS — L97529 Non-pressure chronic ulcer of other part of left foot with unspecified severity: Secondary | ICD-10-CM | POA: Diagnosis not present

## 2020-03-05 DIAGNOSIS — I70245 Atherosclerosis of native arteries of left leg with ulceration of other part of foot: Secondary | ICD-10-CM | POA: Diagnosis not present

## 2020-03-05 DIAGNOSIS — J45909 Unspecified asthma, uncomplicated: Secondary | ICD-10-CM | POA: Diagnosis not present

## 2020-03-05 DIAGNOSIS — Z79899 Other long term (current) drug therapy: Secondary | ICD-10-CM | POA: Insufficient documentation

## 2020-03-05 DIAGNOSIS — I739 Peripheral vascular disease, unspecified: Secondary | ICD-10-CM

## 2020-03-05 DIAGNOSIS — I998 Other disorder of circulatory system: Secondary | ICD-10-CM | POA: Diagnosis not present

## 2020-03-05 HISTORY — PX: ABDOMINAL AORTOGRAM W/LOWER EXTREMITY: CATH118223

## 2020-03-05 LAB — POCT I-STAT, CHEM 8
BUN: 14 mg/dL (ref 8–23)
Calcium, Ion: 1.11 mmol/L — ABNORMAL LOW (ref 1.15–1.40)
Chloride: 99 mmol/L (ref 98–111)
Creatinine, Ser: 0.7 mg/dL (ref 0.44–1.00)
Glucose, Bld: 76 mg/dL (ref 70–99)
HCT: 42 % (ref 36.0–46.0)
Hemoglobin: 14.3 g/dL (ref 12.0–15.0)
Potassium: 4.4 mmol/L (ref 3.5–5.1)
Sodium: 136 mmol/L (ref 135–145)
TCO2: 27 mmol/L (ref 22–32)

## 2020-03-05 SURGERY — ABDOMINAL AORTOGRAM W/LOWER EXTREMITY
Anesthesia: LOCAL | Laterality: Bilateral

## 2020-03-05 MED ORDER — IODIXANOL 320 MG/ML IV SOLN
INTRAVENOUS | Status: DC | PRN
  Administered 2020-03-05: 125 mL

## 2020-03-05 MED ORDER — LABETALOL HCL 5 MG/ML IV SOLN
10.0000 mg | INTRAVENOUS | Status: DC | PRN
  Administered 2020-03-05 (×2): 10 mg via INTRAVENOUS

## 2020-03-05 MED ORDER — ASPIRIN EC 81 MG PO TBEC: 81.0000 mg | DELAYED_RELEASE_TABLET | Freq: Every day | ORAL | Status: DC

## 2020-03-05 MED ORDER — SODIUM CHLORIDE 0.9 % IV SOLN: INTRAVENOUS | Status: AC

## 2020-03-05 MED ORDER — SODIUM CHLORIDE 0.9% FLUSH: 3.0000 mL | Freq: Two times a day (BID) | INTRAVENOUS | Status: DC

## 2020-03-05 MED ORDER — LIDOCAINE HCL (PF) 1 % IJ SOLN
INTRAMUSCULAR | Status: DC | PRN
  Administered 2020-03-05: 8 mL

## 2020-03-05 MED ORDER — SODIUM CHLORIDE 0.9 % IV SOLN: INTRAVENOUS | Status: DC

## 2020-03-05 MED ORDER — HYDRALAZINE HCL 20 MG/ML IJ SOLN
5.0000 mg | INTRAMUSCULAR | Status: AC | PRN
  Administered 2020-03-05 (×2): 5 mg via INTRAVENOUS

## 2020-03-05 MED ORDER — ACETAMINOPHEN 325 MG PO TABS
650.0000 mg | ORAL_TABLET | ORAL | Status: DC | PRN
  Administered 2020-03-05: 650 mg via ORAL
  Filled 2020-03-05: qty 2

## 2020-03-05 MED ORDER — HEPARIN (PORCINE) IN NACL 1000-0.9 UT/500ML-% IV SOLN
INTRAVENOUS | Status: AC
  Filled 2020-03-05: qty 1000

## 2020-03-05 MED ORDER — LABETALOL HCL 5 MG/ML IV SOLN
INTRAVENOUS | Status: AC
  Filled 2020-03-05: qty 4

## 2020-03-05 MED ORDER — SODIUM CHLORIDE 0.9 % IV SOLN: 250.0000 mL | INTRAVENOUS | Status: DC | PRN

## 2020-03-05 MED ORDER — SODIUM CHLORIDE 0.9% FLUSH: 3.0000 mL | INTRAVENOUS | Status: DC | PRN

## 2020-03-05 MED ORDER — ONDANSETRON HCL 4 MG/2ML IJ SOLN: 4.0000 mg | Freq: Four times a day (QID) | INTRAMUSCULAR | Status: DC | PRN

## 2020-03-05 MED ORDER — HYDRALAZINE HCL 20 MG/ML IJ SOLN
INTRAMUSCULAR | Status: AC
  Filled 2020-03-05: qty 1

## 2020-03-05 MED ORDER — LIDOCAINE HCL (PF) 1 % IJ SOLN
INTRAMUSCULAR | Status: AC
  Filled 2020-03-05: qty 30

## 2020-03-05 MED ORDER — HEPARIN (PORCINE) IN NACL 1000-0.9 UT/500ML-% IV SOLN
INTRAVENOUS | Status: DC | PRN
  Administered 2020-03-05 (×2): 500 mL

## 2020-03-05 SURGICAL SUPPLY — 9 items
CATH OMNI FLUSH 5F 65CM (CATHETERS) ×2 IMPLANT
KIT MICROPUNCTURE NIT STIFF (SHEATH) ×2 IMPLANT
KIT PV (KITS) ×2 IMPLANT
SHEATH PINNACLE 5F 10CM (SHEATH) ×2 IMPLANT
SHEATH PROBE COVER 6X72 (BAG) ×2 IMPLANT
SYR MEDRAD MARK V 150ML (SYRINGE) ×2 IMPLANT
TRANSDUCER W/STOPCOCK (MISCELLANEOUS) ×2 IMPLANT
TRAY PV CATH (CUSTOM PROCEDURE TRAY) ×2 IMPLANT
WIRE BENTSON .035X145CM (WIRE) ×2 IMPLANT

## 2020-03-05 NOTE — Op Note (Signed)
    Patient name: Veronica Stewart MRN: [id number] DOB: 10-29-1955 Sex: female  03/05/2020 Pre-operative Diagnosis: Critical limb ischemia of the left lower extremity with tissue loss Post-operative diagnosis:  Same Surgeon:  Marty Heck, MD Procedure Performed: 1.  Ultrasound-guided access of right common femoral artery 2.  Aortogram including catheter selection of the aorta 3.  Bilateral lower extremity arteriogram with runoff  Indications: 64 year old female who presented to clinic yesterday with critical limb ischemia of the left lower extremity with tissue loss ongoing since March of this year.  She presents today for planned lower extremity arteriogram and possible intervention after risk benefits discussed.  Findings:   Aortogram showed patent renal arteries bilaterally although the right renal artery has about a 50% stenosis in mid segment of the artery.  Infrarenal aorta is widely patent with no flow-limiting stenosis.  On the left she has a patent common iliac artery and external iliac artery occlusion with reconstitution of a very diseased and calcified common femoral and then flush SFA occlusion with only profunda runoff.  She does reconstitute a very diseased above-knee popliteal artery with a healthier looking below-knee popliteal artery and only single-vessel peroneal runoff with a collateral at the ankle to the posterior tibial artery.  On the right she has a patent common and external iliac as well as a patent common femoral.  There are multiple high-grade stenosis in the proximal SFA with the remainder of the SFA popliteal artery patent and apparent single-vessel runoff via the anterior tibial artery.  Flow was much more sluggish on the right and difficult to evaluate complete runoff into the foot.   Procedure:  The patient was identified in the holding area and taken to room 8.  The patient was then placed supine on the table and prepped and draped in the usual sterile  fashion.  A time out was called.  Ultrasound was used to evaluate the right common femoral artery.  It was patent .  A digital ultrasound image was acquired.  A micropuncture needle was used to access the right common femoral artery under ultrasound guidance.  An 018 wire was advanced without resistance and a micropuncture sheath was placed.  The 018 wire was removed and a benson wire was placed.  The micropuncture sheath was exchanged for a 5 french sheath.  Next the catheter was pulled down and bilateral lower extremity runoff was obtained with pertinent findings noted above.  After evaluating the images on the left she has an external iliac occlusion with a reconstitution of a very diseased common femoral and then a flush SFA occlusion with above-knee popliteal artery reconstitutes and single-vessel peroneal runoff.  I think she will be best served with open surgery.  Wires and catheters were removed.  Should was taken to holding have her sheath removed from right common femoral artery and remained stable.  Plan: Patient will be scheduled for left femoral endarterectomy with attempted retrograde iliac stent and left fem pop bypass.  Vein mapping will be obtained today.  If unable to cross her iliac occlusion would potentially need a femorofemoral bypass with left femoropopliteal bypass.  I offered to schedule her surgery early next week and she wants to delay until early August.  I discussed there is some risk of worsening tissue loss in her left foot.   Marty Heck, MD [facility] Office: [place]

## 2020-03-05 NOTE — Progress Notes (Signed)
Lower extremity mapping has been completed.   Preliminary results in CV Proc.   Abram Sander 03/05/2020 3:18 PM

## 2020-03-05 NOTE — Progress Notes (Signed)
Site area: rt groin fa sheath Site Prior to Removal:  Level 0 Pressure Applied For: 20 minutes Manual:   yes Patient Status During Pull:  stable Post Pull Site:  Level 0 Post Pull Instructions Given:  yes Post Pull Pulses Present: rt peroneal dopplered Dressing Applied:  Gauze and tegaderm Bedrest begins @ 2550 Comments:

## 2020-03-05 NOTE — Progress Notes (Signed)
Up and tolerated well; right groin stable no bleeding or hematoma

## 2020-03-05 NOTE — H&P (Signed)
History and Physical Interval Note:  03/05/2020 9:39 AM  [name]  has presented today for surgery, with the diagnosis of ischemia.  The various methods of treatment have been discussed with the patient and family. After consideration of risks, benefits and other options for treatment, the patient has consented to  Procedure(s): ABDOMINAL AORTOGRAM W/LOWER EXTREMITY (N/A) as a surgical intervention.  The patient's history has been reviewed, patient examined, no change in status, stable for surgery.  I have reviewed the patient's chart and labs.  Questions were answered to the patient's satisfaction.    CLI left foot with rest pain and tissue loss.  Marty Heck  Patient name: Veronica Stewart         MRN: [id number]        DOB: October 31, 1955          Sex: female  REASON FOR CONSULT: Pain and tissue loss left foot  HPI: Veronica Stewart is a 64 y.o. female, with history of hypertension and tobacco abuse that presents for evaluation of PAD in the left lower extremity.  She describes having severe pain in the left foot all the time even at night since at least March of this year.  She has also had a left fifth toenail fall off with a small ulceration.  She has been followed by Dr. Earleen Newport with [facility] and ultimately he made a referral here to vascular surgery.  She states her right foot is doing fine and she is only having issues with the left foot.  She denies any other medical problems other than hypertension.  Does smoke half pack a day and has smoked her entire life.  Does walk with a walker following a fall with a hip fracture back in March.  No previous lower extremity revascularization.      Past Medical History:  Diagnosis Date  . Asthma   . Hypertension          Past Surgical History:  Procedure Laterality Date  . INTRAMEDULLARY (IM) NAIL INTERTROCHANTERIC Left 03/18/2018   Procedure: INTRAMEDULLARY (IM) NAIL INTERTROCHANTRIC;  Surgeon: Shona Needles, MD;  Location: [facility];  Service: Orthopedics;  Laterality: Left;         Family History  Problem Relation Age of Onset  . Sudden Cardiac Death Neg Hx     SOCIAL HISTORY: Social History        Socioeconomic History  . Marital status: Legally Separated    Spouse name: Not on file  . Number of children: Not on file  . Years of education: Not on file  . Highest education level: Not on file  Occupational History  . Not on file  Tobacco Use  . Smoking status: Current Every Day Smoker    Packs/day: 0.50    Types: Cigarettes  . Smokeless tobacco: Never Used  Substance and Sexual Activity  . Alcohol use: Yes    Comment: two 40 oz beers most days   . Drug use: Yes    Types: Marijuana  . Sexual activity: Not on file  Other Topics Concern  . Not on file  Social History Narrative  . Not on file   Social Determinants of Health      Financial Resource Strain:   . Difficulty of Paying Living Expenses:   Food Insecurity:   . Worried About Charity fundraiser in the Last Year:   . Arboriculturist in the Last Year:   Transportation Needs:   .  Lack of Transportation (Medical):   Marland Kitchen Lack of Transportation (Non-Medical):   Physical Activity:   . Days of Exercise per Week:   . Minutes of Exercise per Session:   Stress:   . Feeling of Stress :   Social Connections:   . Frequency of Communication with Friends and Family:   . Frequency of Social Gatherings with Friends and Family:   . Attends Religious Services:   . Active Member of Clubs or Organizations:   . Attends Archivist Meetings:   Marland Kitchen Marital Status:   Intimate Partner Violence:   . Fear of Current or Ex-Partner:   . Emotionally Abused:   Marland Kitchen Physically Abused:   . Sexually Abused:     No Known Allergies  Current Outpatient Medications  Medication Sig Dispense Refill  . albuterol (VENTOLIN HFA) 108 (90 Base) MCG/ACT inhaler INHALE 2 PUFFS BY MOUTH EVERY 6 HOURS AS NEEDED FOR WHEEZING  AND SHORTNESS OF BREATH 8.5 g 2  . cholecalciferol 2000 units TABS Take 1 tablet (2,000 Units total) by mouth daily. 30 tablet 1  . Fluticasone-Salmeterol (ADVAIR) 100-50 MCG/DOSE AEPB Inhale 1 puff into the lungs 2 (two) times daily. 1 each 3  . mupirocin ointment (BACTROBAN) 2 % Apply 1 application topically 2 (two) times daily. 30 g 2  . Nutritional Supplements (ENSURE ACTIVE HIGH PROTEIN) LIQD Take 237 oz by mouth 2 (two) times daily. 2964 mL 10  . PROLIA 60 MG/ML SOSY injection Inject into the skin as directed.    . Vitamin D, Ergocalciferol, (DRISDOL) 1.25 MG (50000 UNIT) CAPS capsule Take 50,000 Units by mouth 2 (two) times a week.    . doxycycline (VIBRA-TABS) 100 MG tablet Take 1 tablet (100 mg total) by mouth 2 (two) times daily. (Patient not taking: Reported on 03/04/2020) 20 tablet 0            Current Facility-Administered Medications  Medication Dose Route Frequency Provider Last Rate Last Admin  . albuterol (PROVENTIL) (2.5 MG/3ML) 0.083% nebulizer solution 2.5 mg  2.5 mg Nebulization Once Kerin Perna, NP        REVIEW OF SYSTEMS:  [X]  denotes positive finding, [ ]  denotes negative finding Cardiac  Comments:  Chest pain or chest pressure:    Shortness of breath upon exertion:    Short of breath when lying flat:    Irregular heart rhythm:        Vascular    Pain in calf, thigh, or hip brought on by ambulation:    Pain in feet at night that wakes you up from your sleep:     Blood clot in your veins:    Leg swelling:         Pulmonary    Oxygen at home:    Productive cough:     Wheezing:         Neurologic    Sudden weakness in arms or legs:     Sudden numbness in arms or legs:     Sudden onset of difficulty speaking or slurred speech:    Temporary loss of vision in one eye:     Problems with dizziness:         Gastrointestinal    Blood in stool:     Vomited blood:         Genitourinary     Burning when urinating:     Blood in urine:        Psychiatric    Major depression:  Hematologic    Bleeding problems:    Problems with blood clotting too easily:        Skin    Rashes or ulcers:        Constitutional    Fever or chills:      PHYSICAL EXAM:    Vitals:   03/04/20 0900  BP: (!) 164/118  Pulse: 93  Resp: 16  Temp: (!) 97.2 F (36.2 C)  TempSrc: Temporal  SpO2: 100%  Weight: 102 lb (46.3 kg)  Height: 5\' 9"  (1.753 m)    GENERAL: The patient is a well-nourished female, in no acute distress. The vital signs are documented above. CARDIAC: There is a regular rate and rhythm.  VASCULAR:  Right femoral pulse palpable Left femoral pulse nonpalpable No palpable pedal pulses PULMONARY: There is good air exchange bilaterally without wheezing or rales. ABDOMEN: Soft and non-tender with normal pitched bowel sounds.  MUSCULOSKELETAL: There are no major deformities or cyanosis. NEUROLOGIC: No focal weakness or paresthesias are detected. SKIN: There are no ulcers or rashes noted. PSYCHIATRIC: The patient has a normal affect.      DATA:   ABIs today were 0.81 on the right monophasic and absent on the left  Assessment/Plan:  64 year old female with history of hypertension and tobacco abuse that presents for evaluation of critical limb ischemia with tissue loss in the left lower extremity.  She describes severe rest pain in the foot since at least March and now has a ulceration on her left fifth toe after toenail fell off.  Unable to obtain any ABIs in clinic today.  She does remain motor and sensory intact.  I recommended left lower extremity arteriogram with possible intervention tomorrow in the Cath Lab.  We discussed that she may ultimately require bypass if there are no endovascular options.  She is certainly at high risk for limb loss.  Risks and benefits discussed in detail.   Marty Heck,  MD [facility] Office: [phone]

## 2020-03-05 NOTE — Discharge Instructions (Signed)
Femoral Site Care This sheet gives you information about how to care for yourself after your procedure. Your health care provider may also give you more specific instructions. If you have problems or questions, contact your health care provider. What can I expect after the procedure? After the procedure, it is common to have:  Bruising that usually fades within 1-2 weeks.  Tenderness at the site. Follow these instructions at home: Wound care  Follow instructions from your health care provider about how to take care of your insertion site. Make sure you: ? Wash your hands with soap and water before you change your bandage (dressing). If soap and water are not available, use hand sanitizer. ? Change your dressing as told by your health care provider. ? Leave stitches (sutures), skin glue, or adhesive strips in place. These skin closures may need to stay in place for 2 weeks or longer. If adhesive strip edges start to loosen and curl up, you may trim the loose edges. Do not remove adhesive strips completely unless your health care provider tells you to do that.  Do not take baths, swim, or use a hot tub until your health care provider approves.  You may shower 24-48 hours after the procedure or as told by your health care provider. ? Gently wash the site with plain soap and water. ? Pat the area dry with a clean towel. ? Do not rub the site. This may cause bleeding.  Do not apply powder or lotion to the site. Keep the site clean and dry.  Check your femoral site every day for signs of infection. Check for: ? Redness, swelling, or pain. ? Fluid or blood. ? Warmth. ? Pus or a bad smell. Activity  For the first 2-3 days after your procedure, or as long as directed: ? Avoid climbing stairs as much as possible. ? Do not squat.  Do not lift anything that is heavier than 10 lb (4.5 kg), or the limit that you are told, until your health care provider says that it is safe.  Rest as  directed. ? Avoid sitting for a long time without moving. Get up to take short walks every 1-2 hours.  Do not drive for 24 hours if you were given a medicine to help you relax (sedative). General instructions  Take over-the-counter and prescription medicines only as told by your health care provider.  Keep all follow-up visits as told by your health care provider. This is important. Contact a health care provider if you have:  A fever or chills.  You have redness, swelling, or pain around your insertion site. Get help right away if:  The catheter insertion area swells very fast.  You pass out.  You suddenly start to sweat or your skin gets clammy.  The catheter insertion area is bleeding, and the bleeding does not stop when you hold steady pressure on the area.  The area near or just beyond the catheter insertion site becomes pale, cool, tingly, or numb. These symptoms may represent a serious problem that is an emergency. Do not wait to see if the symptoms will go away. Get medical help right away. Call your local emergency services (911 in the [place] Do not drive yourself to the hospital. Summary  After the procedure, it is common to have bruising that usually fades within 1-2 weeks.  Check your femoral site every day for signs of infection.  Do not lift anything that is heavier than 10 lb (4.5 kg), or the   limit that you are told, until your health care provider says that it is safe. This information is not intended to replace advice given to you by your health care provider. Make sure you discuss any questions you have with your health care provider. Document Revised: 08/15/2017 Document Reviewed: 08/15/2017 Elsevier Patient Education  2020 [employer]  

## 2020-03-05 NOTE — Progress Notes (Signed)
Report received from Cassandra, RN

## 2020-03-06 ENCOUNTER — Other Ambulatory Visit: Payer: Self-pay

## 2020-03-06 ENCOUNTER — Encounter (HOSPITAL_COMMUNITY): Payer: Self-pay | Admitting: Vascular Surgery

## 2020-03-07 ENCOUNTER — Telehealth (INDEPENDENT_AMBULATORY_CARE_PROVIDER_SITE_OTHER): Payer: Self-pay | Admitting: Primary Care

## 2020-03-07 NOTE — Telephone Encounter (Signed)
Pt last seen Veronica Stewart on 01/07/2020. Pt will be having femoral endarterectomy on 8-9-2021and would like Veronica Stewart to call her to discuss getting home health aid to assistant her after surgery. Pt has  Medicaid insurance

## 2020-03-10 ENCOUNTER — Ambulatory Visit: Payer: Medicaid Other | Admitting: Podiatry

## 2020-03-10 ENCOUNTER — Encounter: Payer: Self-pay | Admitting: Podiatry

## 2020-03-10 ENCOUNTER — Other Ambulatory Visit: Payer: Self-pay

## 2020-03-10 DIAGNOSIS — M79672 Pain in left foot: Secondary | ICD-10-CM

## 2020-03-10 DIAGNOSIS — I96 Gangrene, not elsewhere classified: Secondary | ICD-10-CM | POA: Diagnosis not present

## 2020-03-10 DIAGNOSIS — I739 Peripheral vascular disease, unspecified: Secondary | ICD-10-CM | POA: Diagnosis not present

## 2020-03-13 NOTE — Telephone Encounter (Signed)
Spoke to patient and informed on PCP advising. Pt. Understood.  

## 2020-03-16 NOTE — Progress Notes (Signed)
Subjective: 64 year old female presents the office today for evaluation of gangrenous changes to her left foot on the distal aspect of the toes.  She denies any open sores currently denies any drainage coming from the areas.  She gets pain to the foot and the toes.  She did follow-up with vascular surgery and she is scheduled for possible bypass. Denies any systemic complaints such as fevers, chills, nausea, vomiting. No acute changes since last appointment, and no other complaints at this time.   Objective: AAO x3, NAD DP/PT pulses palpable bilaterally, CRT less than 3 seconds Gangrenous changes present the distal aspect of the toes on left side but appear to be stable.  The wound was present fifth toe has dried.  There is no drainage or pus from the toes there is no fluctuation.  Minimal swelling of the foot.  No warmth. No pain with calf compression, swelling, warmth, erythema  Assessment: 63 year old female with stable gangrenous changes left foot  Plan: -All treatment options discussed with the patient including all alternatives, risks, complications.  -For now keep area toes clean and dry.  Monitoring skin breakdown or signs of infection and report emergency room treatment occur.  Follow-up with vascular surgery. -Patient encouraged to call the office with any questions, concerns, change in symptoms.   Trula Slade DPM

## 2020-03-17 NOTE — Progress Notes (Signed)
Your procedure is scheduled on Monday, August 9th.  Report to [facility] Main Entrance "A" at 8:50 A.M., and check in at the Admitting office.  Call this number if you have problems the morning of surgery:  [phone]  Call [phone] if you have any questions prior to your surgery date Monday-Friday 8am-4pm   Remember:  Do not eat or drink after midnight the night before your surgery   Take these medicines the morning of surgery with A SIP OF WATER  Fluticasone-Salmeterol (ADVAIR)/inhaler  If needed - acetaminophen (TYLENOL)  albuterol (VENTOLIN HFA)/inhaler - bring with you the day of surgery   As of today, STOP taking any Aspirin (unless otherwise instructed by your surgeon) Aleve, Naproxen, Ibuprofen, Motrin, Advil, Goody's, BC's, all herbal medications, fish oil, and all vitamins.                     Do not wear jewelry, make up, or nail polish            Do not wear lotions, powders, perfumes, or deodorant.            Do not shave 48 hours prior to surgery.              Do not bring valuables to the hospital.            [facility] is not responsible for any belongings or valuables.  Do NOT Smoke (Tobacco/Vaping) or drink Alcohol 24 hours prior to your procedure If you use a CPAP at night, you may bring all equipment for your overnight stay.   Contacts, glasses, dentures or bridgework may not be worn into surgery.      For patients admitted to the hospital, discharge time will be determined by your treatment team.   Patients discharged the day of surgery will not be allowed to drive home, and someone needs to stay with them for 24 hours.  Special instructions:   [place]- Preparing For Surgery  Before surgery, you can play an important role. Because skin is not sterile, your skin needs to be as free of germs as possible. You can reduce the number of germs on your skin by washing with CHG (chlorahexidine gluconate) Soap before surgery.  CHG is an antiseptic cleaner  which kills germs and bonds with the skin to continue killing germs even after washing.    Oral Hygiene is also important to reduce your risk of infection.  Remember - BRUSH YOUR TEETH THE MORNING OF SURGERY WITH YOUR REGULAR TOOTHPASTE  Please do not use if you have an allergy to CHG or antibacterial soaps. If your skin becomes reddened/irritated stop using the CHG.  Do not shave (including legs and underarms) for at least 48 hours prior to first CHG shower. It is OK to shave your face.  Please follow these instructions carefully.   1. Shower the NIGHT BEFORE SURGERY and the MORNING OF SURGERY with CHG Soap.   2. If you chose to wash your hair, wash your hair first as usual with your normal shampoo.  3. After you shampoo, rinse your hair and body thoroughly to remove the shampoo.  4. Use CHG as you would any other liquid soap. You can apply CHG directly to the skin and wash gently with a scrungie or a clean washcloth.   5. Apply the CHG Soap to your body ONLY FROM THE NECK DOWN.  Do not use on open wounds or open sores. Avoid contact with your  eyes, ears, mouth and genitals (private parts). Wash Face and genitals (private parts)  with your normal soap.   6. Wash thoroughly, paying special attention to the area where your surgery will be performed.  7. Thoroughly rinse your body with warm water from the neck down.  8. DO NOT shower/wash with your normal soap after using and rinsing off the CHG Soap.  9. Pat yourself dry with a CLEAN TOWEL.  10. Wear CLEAN PAJAMAS to bed the night before surgery  11. Place CLEAN SHEETS on your bed the night of your first shower and DO NOT SLEEP WITH PETS.  Day of Surgery: Wear Clean/Comfortable clothing the morning of surgery Do not apply any deodorants/lotions.   Remember to brush your teeth WITH YOUR REGULAR TOOTHPASTE.   Please read over the following fact sheets that you were given.

## 2020-03-18 ENCOUNTER — Telehealth: Payer: Self-pay

## 2020-03-18 ENCOUNTER — Encounter (HOSPITAL_COMMUNITY): Payer: Self-pay

## 2020-03-18 ENCOUNTER — Other Ambulatory Visit: Payer: Self-pay

## 2020-03-18 ENCOUNTER — Encounter (HOSPITAL_COMMUNITY)
Admission: RE | Admit: 2020-03-18 | Discharge: 2020-03-18 | Disposition: A | Discharge status: Home / Self Care | Payer: Medicaid Other | Source: Ambulatory Visit | Attending: Vascular Surgery | Admitting: Vascular Surgery

## 2020-03-18 DIAGNOSIS — Z01812 Encounter for preprocedural laboratory examination: Secondary | ICD-10-CM | POA: Insufficient documentation

## 2020-03-18 DIAGNOSIS — N390 Urinary tract infection, site not specified: Secondary | ICD-10-CM

## 2020-03-18 LAB — APTT: aPTT: 37 seconds — ABNORMAL HIGH (ref 24–36)

## 2020-03-18 LAB — TYPE AND SCREEN
ABO/RH(D): O POS
Antibody Screen: NEGATIVE

## 2020-03-18 LAB — URINALYSIS, ROUTINE W REFLEX MICROSCOPIC
Bilirubin Urine: NEGATIVE
Glucose, UA: NEGATIVE mg/dL
Ketones, ur: NEGATIVE mg/dL
Nitrite: NEGATIVE
Protein, ur: 30 mg/dL — AB
Specific Gravity, Urine: 1.028 (ref 1.005–1.030)
WBC, UA: >50 WBC/hpf — ABNORMAL HIGH (ref 0–5)
pH: 5 (ref 5.0–8.0)

## 2020-03-18 LAB — COMPREHENSIVE METABOLIC PANEL
ALT: 11 U/L (ref 0–44)
AST: 22 U/L (ref 15–41)
Albumin: 4.1 g/dL (ref 3.5–5.0)
Alkaline Phosphatase: 52 U/L (ref 38–126)
Anion gap: 11 (ref 5–15)
BUN: 16 mg/dL (ref 8–23)
CO2: 24 mmol/L (ref 22–32)
Calcium: 9.4 mg/dL (ref 8.9–10.3)
Chloride: 99 mmol/L (ref 98–111)
Creatinine, Ser: 0.9 mg/dL (ref 0.44–1.00)
GFR calc Af Amer: >60 mL/min (ref >60)
GFR calc non Af Amer: >60 mL/min (ref >60)
Glucose, Bld: 83 mg/dL (ref 70–99)
Potassium: 3.9 mmol/L (ref 3.5–5.1)
Sodium: 134 mmol/L — ABNORMAL LOW (ref 135–145)
Total Bilirubin: 1 mg/dL (ref 0.3–1.2)
Total Protein: 8.3 g/dL — ABNORMAL HIGH (ref 6.5–8.1)

## 2020-03-18 LAB — CBC
HCT: 41.9 % (ref 36.0–46.0)
Hemoglobin: 13 g/dL (ref 12.0–15.0)
MCH: 28.3 pg (ref 26.0–34.0)
MCHC: 31 g/dL (ref 30.0–36.0)
MCV: 91.1 fL (ref 80.0–100.0)
Platelets: 320 10*3/uL (ref 150–400)
RBC: 4.6 MIL/uL (ref 3.87–5.11)
RDW: 16.4 % — ABNORMAL HIGH (ref 11.5–15.5)
WBC: 8 10*3/uL (ref 4.0–10.5)
nRBC: 0 % (ref 0.0–0.2)

## 2020-03-18 LAB — PROTIME-INR
INR: 1.1 (ref 0.8–1.2)
Prothrombin Time: 13.8 seconds (ref 11.4–15.2)

## 2020-03-18 LAB — SURGICAL PCR SCREEN
MRSA, PCR: NEGATIVE
Staphylococcus aureus: NEGATIVE

## 2020-03-18 MED ORDER — CIPROFLOXACIN HCL 500 MG PO TABS: 500.0000 mg | ORAL_TABLET | Freq: Two times a day (BID) | ORAL | 0 refills | Status: DC

## 2020-03-18 NOTE — Telephone Encounter (Signed)
Informed pt that urine test from today indicates urinary tract infection and will send Rx for the antibiotic ciprofloxacin 500 mg take 1 tablet twice daily x 7 days to pharmacy. NKDA verified. Pt verbalized understanding.

## 2020-03-18 NOTE — Progress Notes (Signed)
Abnormal labs resulted. Dr. Carlis Abbott notified via [place].

## 2020-03-18 NOTE — Progress Notes (Signed)
PCP - Juluis Mire, NP  Cardiologist - denies  PPM/ICD - denies  Chest x-ray - N/A EKG - 03/05/2020 Stress Test - denies ECHO - denies Cardiac Cath - denies  Sleep Study - denies CPAP - N/A  DM: denies  Blood Thinner Instructions: N/A Aspirin Instructions: N/A  ERAS Protcol - No  COVID TEST- Scheduled for 03/21/2020. Patient verbalized understanding of self-quarantine instructions, appointment time and place.  Anesthesia review: No  Patient denies shortness of breath, fever, cough and chest pain at PAT appointment  All instructions explained to the patient, with a verbal understanding of the material. Patient agrees to go over the instructions while at home for a better understanding. Patient also instructed to self quarantine after being tested for COVID-19. The opportunity to ask questions was provided.

## 2020-03-20 ENCOUNTER — Ambulatory Visit (INDEPENDENT_AMBULATORY_CARE_PROVIDER_SITE_OTHER): Payer: Medicaid Other | Admitting: Podiatry

## 2020-03-20 ENCOUNTER — Other Ambulatory Visit: Payer: Self-pay

## 2020-03-20 DIAGNOSIS — I96 Gangrene, not elsewhere classified: Secondary | ICD-10-CM

## 2020-03-20 DIAGNOSIS — I739 Peripheral vascular disease, unspecified: Secondary | ICD-10-CM

## 2020-03-20 DIAGNOSIS — M79672 Pain in left foot: Secondary | ICD-10-CM

## 2020-03-21 ENCOUNTER — Other Ambulatory Visit (HOSPITAL_COMMUNITY)
Admission: RE | Admit: 2020-03-21 | Discharge: 2020-03-21 | Disposition: A | Discharge status: Home / Self Care | Payer: Medicaid Other | Source: Ambulatory Visit | Attending: Vascular Surgery | Admitting: Vascular Surgery

## 2020-03-21 DIAGNOSIS — Z01812 Encounter for preprocedural laboratory examination: Secondary | ICD-10-CM | POA: Insufficient documentation

## 2020-03-21 DIAGNOSIS — Z20822 Contact with and (suspected) exposure to covid-19: Secondary | ICD-10-CM | POA: Diagnosis not present

## 2020-03-21 LAB — SARS CORONAVIRUS 2 (TAT 6-24 HRS): SARS Coronavirus 2: NEGATIVE

## 2020-03-23 NOTE — Progress Notes (Signed)
Subjective: 64 year old female presents the office today for evaluation of gangrenous changes to her left foot on the distal aspect of the toes.  She states that she seems to be doing well.  She is scheduled for vascular procedure next week. She denies any new sores to her foot or any new issues.  Denies any fevers, chills, nausea, vomiting.  No calf pain, chest pain, shortness of breath.   Objective: AAO x3, NAD DP/PT pulses palpable bilaterally, CRT less than 3 seconds Gangrenous changes present the distal aspect of the toes on left side but appear to be stable still without any drainage or pus or any new sores identified.  Mild edema there is no erythema or warmth associated the foot.  There is no areas of fluctuation. No pain with calf compression, swelling, warmth, erythema  Assessment: 64 year old female with stable gangrenous changes left foot  Plan: -All treatment options discussed with the patient including all alternatives, risks, complications.  -For now continue to keep area toes clean and dry.  Monitoring skin breakdown or signs of infection and report emergency room treatment occur. -Schedule her surgery on Monday with vascular surgery. -Patient encouraged to call the office with any questions, concerns, change in symptoms.   Trula Slade DPM

## 2020-03-24 ENCOUNTER — Inpatient Hospital Stay (HOSPITAL_COMMUNITY): Payer: Medicaid Other

## 2020-03-24 ENCOUNTER — Other Ambulatory Visit: Payer: Self-pay

## 2020-03-24 ENCOUNTER — Inpatient Hospital Stay (HOSPITAL_COMMUNITY)
Admission: RE | Admit: 2020-03-24 | Discharge: 2020-03-28 | DRG: 271 | Disposition: A | Discharge status: Home Health | Payer: Medicaid Other | Attending: Vascular Surgery | Admitting: Vascular Surgery

## 2020-03-24 ENCOUNTER — Encounter (HOSPITAL_COMMUNITY): Payer: Self-pay | Admitting: Vascular Surgery

## 2020-03-24 ENCOUNTER — Inpatient Hospital Stay (HOSPITAL_COMMUNITY): Payer: Medicaid Other | Admitting: Certified Registered Nurse Anesthetist

## 2020-03-24 ENCOUNTER — Encounter (HOSPITAL_COMMUNITY)
Admission: RE | Disposition: A | Discharge status: Home Health | Payer: Self-pay | Source: Home / Self Care | Attending: Vascular Surgery

## 2020-03-24 DIAGNOSIS — I745 Embolism and thrombosis of iliac artery: Secondary | ICD-10-CM | POA: Diagnosis present

## 2020-03-24 DIAGNOSIS — J45909 Unspecified asthma, uncomplicated: Secondary | ICD-10-CM | POA: Diagnosis present

## 2020-03-24 DIAGNOSIS — J4520 Mild intermittent asthma, uncomplicated: Secondary | ICD-10-CM

## 2020-03-24 DIAGNOSIS — R000 Tachycardia, unspecified: Secondary | ICD-10-CM | POA: Diagnosis not present

## 2020-03-24 DIAGNOSIS — L97529 Non-pressure chronic ulcer of other part of left foot with unspecified severity: Secondary | ICD-10-CM | POA: Diagnosis present

## 2020-03-24 DIAGNOSIS — I10 Essential (primary) hypertension: Secondary | ICD-10-CM | POA: Diagnosis present

## 2020-03-24 DIAGNOSIS — F17210 Nicotine dependence, cigarettes, uncomplicated: Secondary | ICD-10-CM | POA: Diagnosis present

## 2020-03-24 DIAGNOSIS — I739 Peripheral vascular disease, unspecified: Secondary | ICD-10-CM

## 2020-03-24 DIAGNOSIS — I70262 Atherosclerosis of native arteries of extremities with gangrene, left leg: Principal | ICD-10-CM | POA: Diagnosis present

## 2020-03-24 DIAGNOSIS — I779 Disorder of arteries and arterioles, unspecified: Secondary | ICD-10-CM

## 2020-03-24 DIAGNOSIS — I96 Gangrene, not elsewhere classified: Secondary | ICD-10-CM

## 2020-03-24 HISTORY — PX: INTRAOPERATIVE ARTERIOGRAM: SHX5157

## 2020-03-24 HISTORY — PX: FEMORAL-POPLITEAL BYPASS GRAFT: SHX937

## 2020-03-24 HISTORY — PX: ENDOVEIN HARVEST OF GREATER SAPHENOUS VEIN: SHX5059

## 2020-03-24 HISTORY — PX: ENDARTERECTOMY FEMORAL: SHX5804

## 2020-03-24 HISTORY — PX: INSERTION OF ILIAC STENT: SHX6256

## 2020-03-24 LAB — POCT ACTIVATED CLOTTING TIME
Activated Clotting Time: 252 seconds
Activated Clotting Time: 175 seconds
Activated Clotting Time: 285 seconds
Activated Clotting Time: 224 seconds

## 2020-03-24 SURGERY — ENDARTERECTOMY, FEMORAL
Anesthesia: General | Site: Leg Upper

## 2020-03-24 MED ORDER — SUGAMMADEX SODIUM 200 MG/2ML IV SOLN
INTRAVENOUS | Status: DC | PRN
  Administered 2020-03-24: 93 mg via INTRAVENOUS

## 2020-03-24 MED ORDER — FENTANYL CITRATE (PF) 100 MCG/2ML IJ SOLN: 25.0000 ug | INTRAMUSCULAR | Status: DC | PRN

## 2020-03-24 MED ORDER — MIDAZOLAM HCL 5 MG/5ML IJ SOLN
INTRAMUSCULAR | Status: DC | PRN
  Administered 2020-03-24: 2 mg via INTRAVENOUS

## 2020-03-24 MED ORDER — MORPHINE SULFATE (PF) 2 MG/ML IV SOLN
2.0000 mg | INTRAVENOUS | Status: DC | PRN
  Administered 2020-03-24 – 2020-03-25 (×2): 2 mg via INTRAVENOUS
  Filled 2020-03-24 (×2): qty 1

## 2020-03-24 MED ORDER — PANTOPRAZOLE SODIUM 40 MG PO TBEC
40.0000 mg | DELAYED_RELEASE_TABLET | Freq: Every day | ORAL | Status: DC
  Administered 2020-03-24 – 2020-03-28 (×5): 40 mg via ORAL
  Filled 2020-03-24 (×5): qty 1

## 2020-03-24 MED ORDER — ACETAMINOPHEN 500 MG PO TABS: 1000.0000 mg | ORAL_TABLET | Freq: Once | ORAL | Status: DC | PRN

## 2020-03-24 MED ORDER — PROPOFOL 10 MG/ML IV BOLUS
INTRAVENOUS | Status: DC | PRN
  Administered 2020-03-24: 110 mg via INTRAVENOUS

## 2020-03-24 MED ORDER — PROTAMINE SULFATE 10 MG/ML IV SOLN
INTRAVENOUS | Status: AC
  Filled 2020-03-24: qty 10

## 2020-03-24 MED ORDER — HEPARIN SODIUM (PORCINE) 5000 UNIT/ML IJ SOLN
5000.0000 [IU] | Freq: Three times a day (TID) | INTRAMUSCULAR | Status: DC
  Administered 2020-03-25 – 2020-03-28 (×9): 5000 [IU] via SUBCUTANEOUS
  Filled 2020-03-24 (×9): qty 1

## 2020-03-24 MED ORDER — ONDANSETRON HCL 4 MG/2ML IJ SOLN: 4.0000 mg | Freq: Four times a day (QID) | INTRAMUSCULAR | Status: DC | PRN

## 2020-03-24 MED ORDER — ASPIRIN EC 81 MG PO TBEC
81.0000 mg | DELAYED_RELEASE_TABLET | Freq: Every day | ORAL | Status: DC
  Administered 2020-03-25 – 2020-03-28 (×4): 81 mg via ORAL
  Filled 2020-03-24 (×4): qty 1

## 2020-03-24 MED ORDER — ONDANSETRON HCL 4 MG/2ML IJ SOLN
INTRAMUSCULAR | Status: AC
  Filled 2020-03-24: qty 2

## 2020-03-24 MED ORDER — CHLORHEXIDINE GLUCONATE 0.12 % MT SOLN: 15.0000 mL | Freq: Once | OROMUCOSAL | Status: AC

## 2020-03-24 MED ORDER — CHLORHEXIDINE GLUCONATE 0.12 % MT SOLN
OROMUCOSAL | Status: AC
  Administered 2020-03-24: 15 mL via OROMUCOSAL
  Filled 2020-03-24: qty 15

## 2020-03-24 MED ORDER — PROTAMINE SULFATE 10 MG/ML IV SOLN
INTRAVENOUS | Status: AC
  Filled 2020-03-24: qty 25

## 2020-03-24 MED ORDER — IODIXANOL 320 MG/ML IV SOLN
INTRAVENOUS | Status: DC | PRN
  Administered 2020-03-24: 50 mL via INTRA_ARTERIAL

## 2020-03-24 MED ORDER — CHLORHEXIDINE GLUCONATE CLOTH 2 % EX PADS: 6.0000 | MEDICATED_PAD | Freq: Once | CUTANEOUS | Status: DC

## 2020-03-24 MED ORDER — HYDRALAZINE HCL 20 MG/ML IJ SOLN
10.0000 mg | Freq: Once | INTRAMUSCULAR | Status: AC
  Administered 2020-03-24: 10 mg via INTRAVENOUS

## 2020-03-24 MED ORDER — OXYCODONE HCL 5 MG/5ML PO SOLN: 5.0000 mg | Freq: Once | ORAL | Status: DC | PRN

## 2020-03-24 MED ORDER — SODIUM CHLORIDE 0.9 % IV SOLN: 500.0000 mL | Freq: Once | INTRAVENOUS | Status: DC | PRN

## 2020-03-24 MED ORDER — ALBUTEROL SULFATE (2.5 MG/3ML) 0.083% IN NEBU: 3.0000 mL | INHALATION_SOLUTION | Freq: Four times a day (QID) | RESPIRATORY_TRACT | Status: DC | PRN

## 2020-03-24 MED ORDER — SODIUM CHLORIDE 0.9 % IV SOLN: INTRAVENOUS | Status: DC

## 2020-03-24 MED ORDER — ACETAMINOPHEN 160 MG/5ML PO SOLN: 1000.0000 mg | Freq: Once | ORAL | Status: DC | PRN

## 2020-03-24 MED ORDER — ROCURONIUM BROMIDE 10 MG/ML (PF) SYRINGE
PREFILLED_SYRINGE | INTRAVENOUS | Status: AC
  Filled 2020-03-24: qty 10

## 2020-03-24 MED ORDER — ORAL CARE MOUTH RINSE: 15.0000 mL | Freq: Once | OROMUCOSAL | Status: AC

## 2020-03-24 MED ORDER — PHENYLEPHRINE 40 MCG/ML (10ML) SYRINGE FOR IV PUSH (FOR BLOOD PRESSURE SUPPORT)
PREFILLED_SYRINGE | INTRAVENOUS | Status: AC
  Filled 2020-03-24: qty 20

## 2020-03-24 MED ORDER — MIDAZOLAM HCL 2 MG/2ML IJ SOLN
INTRAMUSCULAR | Status: AC
  Filled 2020-03-24: qty 2

## 2020-03-24 MED ORDER — SODIUM CHLORIDE 0.9 % IV SOLN
INTRAVENOUS | Status: DC | PRN
  Administered 2020-03-24: 500 mL

## 2020-03-24 MED ORDER — CEFAZOLIN SODIUM-DEXTROSE 2-4 GM/100ML-% IV SOLN
2.0000 g | INTRAVENOUS | Status: AC
  Administered 2020-03-24: 2 g via INTRAVENOUS
  Filled 2020-03-24: qty 100

## 2020-03-24 MED ORDER — ALBUMIN HUMAN 5 % IV SOLN: INTRAVENOUS | Status: DC | PRN

## 2020-03-24 MED ORDER — HEPARIN SODIUM (PORCINE) 1000 UNIT/ML IJ SOLN
INTRAMUSCULAR | Status: DC | PRN
  Administered 2020-03-24: 4000 [IU] via INTRAVENOUS
  Administered 2020-03-24: 5000 [IU] via INTRAVENOUS

## 2020-03-24 MED ORDER — CLOPIDOGREL BISULFATE 75 MG PO TABS
75.0000 mg | ORAL_TABLET | Freq: Every day | ORAL | Status: DC
  Administered 2020-03-25 – 2020-03-28 (×4): 75 mg via ORAL
  Filled 2020-03-24 (×4): qty 1

## 2020-03-24 MED ORDER — PROPOFOL 10 MG/ML IV BOLUS
INTRAVENOUS | Status: AC
  Filled 2020-03-24: qty 20

## 2020-03-24 MED ORDER — FENTANYL CITRATE (PF) 250 MCG/5ML IJ SOLN
INTRAMUSCULAR | Status: AC
  Filled 2020-03-24: qty 5

## 2020-03-24 MED ORDER — PHENYLEPHRINE HCL-NACL 10-0.9 MG/250ML-% IV SOLN
INTRAVENOUS | Status: DC | PRN
  Administered 2020-03-24: 50 ug/min via INTRAVENOUS

## 2020-03-24 MED ORDER — CEFAZOLIN SODIUM-DEXTROSE 1-4 GM/50ML-% IV SOLN
1.0000 g | Freq: Three times a day (TID) | INTRAVENOUS | Status: AC
  Administered 2020-03-24 – 2020-03-25 (×2): 1 g via INTRAVENOUS
  Filled 2020-03-24 (×2): qty 50

## 2020-03-24 MED ORDER — ONDANSETRON HCL 4 MG/2ML IJ SOLN
INTRAMUSCULAR | Status: DC | PRN
  Administered 2020-03-24: 4 mg via INTRAVENOUS

## 2020-03-24 MED ORDER — ATORVASTATIN CALCIUM 10 MG PO TABS
20.0000 mg | ORAL_TABLET | Freq: Every day | ORAL | Status: DC
  Administered 2020-03-24 – 2020-03-28 (×5): 20 mg via ORAL
  Filled 2020-03-24 (×5): qty 2

## 2020-03-24 MED ORDER — DOCUSATE SODIUM 100 MG PO CAPS
100.0000 mg | ORAL_CAPSULE | Freq: Every day | ORAL | Status: DC
  Administered 2020-03-25 – 2020-03-28 (×4): 100 mg via ORAL
  Filled 2020-03-24 (×4): qty 1

## 2020-03-24 MED ORDER — HYDRALAZINE HCL 20 MG/ML IJ SOLN
10.0000 mg | Freq: Once | INTRAMUSCULAR | Status: AC
  Filled 2020-03-24: qty 1

## 2020-03-24 MED ORDER — HEMOSTATIC AGENTS (NO CHARGE) OPTIME
TOPICAL | Status: DC | PRN
  Administered 2020-03-24: 1 via TOPICAL

## 2020-03-24 MED ORDER — PHENYLEPHRINE 40 MCG/ML (10ML) SYRINGE FOR IV PUSH (FOR BLOOD PRESSURE SUPPORT)
PREFILLED_SYRINGE | INTRAVENOUS | Status: DC | PRN
  Administered 2020-03-24: 40 ug via INTRAVENOUS
  Administered 2020-03-24 (×2): 80 ug via INTRAVENOUS

## 2020-03-24 MED ORDER — DEXAMETHASONE SODIUM PHOSPHATE 10 MG/ML IJ SOLN
INTRAMUSCULAR | Status: AC
  Filled 2020-03-24: qty 1

## 2020-03-24 MED ORDER — LABETALOL HCL 5 MG/ML IV SOLN
10.0000 mg | INTRAVENOUS | Status: DC | PRN
  Filled 2020-03-24: qty 4

## 2020-03-24 MED ORDER — ACETAMINOPHEN 650 MG RE SUPP: 325.0000 mg | RECTAL | Status: DC | PRN

## 2020-03-24 MED ORDER — ENSURE MAX PROTEIN PO LIQD
237.0000 [oz_av] | Freq: Two times a day (BID) | ORAL | Status: DC
  Filled 2020-03-24: qty 7260

## 2020-03-24 MED ORDER — GUAIFENESIN-DM 100-10 MG/5ML PO SYRP: 15.0000 mL | ORAL_SOLUTION | ORAL | Status: DC | PRN

## 2020-03-24 MED ORDER — ENSURE MAX PROTEIN PO LIQD
11.0000 [oz_av] | Freq: Two times a day (BID) | ORAL | Status: DC
  Administered 2020-03-24 – 2020-03-28 (×7): 11 [oz_av] via ORAL
  Filled 2020-03-24 (×8): qty 330

## 2020-03-24 MED ORDER — ACETAMINOPHEN 325 MG PO TABS: 325.0000 mg | ORAL_TABLET | ORAL | Status: DC | PRN

## 2020-03-24 MED ORDER — 0.9 % SODIUM CHLORIDE (POUR BTL) OPTIME
TOPICAL | Status: DC | PRN
  Administered 2020-03-24: 1000 mL

## 2020-03-24 MED ORDER — OXYCODONE HCL 5 MG PO TABS: 5.0000 mg | ORAL_TABLET | Freq: Once | ORAL | Status: DC | PRN

## 2020-03-24 MED ORDER — METOPROLOL TARTRATE 5 MG/5ML IV SOLN
2.0000 mg | INTRAVENOUS | Status: DC | PRN
  Administered 2020-03-24: 2 mg via INTRAVENOUS
  Filled 2020-03-24 (×2): qty 5

## 2020-03-24 MED ORDER — ALBUTEROL SULFATE (2.5 MG/3ML) 0.083% IN NEBU
2.5000 mg | INHALATION_SOLUTION | Freq: Once | RESPIRATORY_TRACT | Status: AC
  Administered 2020-03-24: 2.5 mg via RESPIRATORY_TRACT
  Filled 2020-03-24: qty 3

## 2020-03-24 MED ORDER — LIDOCAINE 2% (20 MG/ML) 5 ML SYRINGE
INTRAMUSCULAR | Status: AC
  Filled 2020-03-24: qty 5

## 2020-03-24 MED ORDER — LACTATED RINGERS IV SOLN: INTRAVENOUS | Status: DC

## 2020-03-24 MED ORDER — HYDRALAZINE HCL 20 MG/ML IJ SOLN
5.0000 mg | INTRAMUSCULAR | Status: DC | PRN
  Filled 2020-03-24: qty 0.25

## 2020-03-24 MED ORDER — ROCURONIUM BROMIDE 10 MG/ML (PF) SYRINGE
PREFILLED_SYRINGE | INTRAVENOUS | Status: DC | PRN
  Administered 2020-03-24: 20 mg via INTRAVENOUS
  Administered 2020-03-24: 60 mg via INTRAVENOUS

## 2020-03-24 MED ORDER — OXYCODONE-ACETAMINOPHEN 5-325 MG PO TABS
1.0000 | ORAL_TABLET | ORAL | Status: DC | PRN
  Administered 2020-03-24 – 2020-03-28 (×13): 2 via ORAL
  Filled 2020-03-24 (×14): qty 2

## 2020-03-24 MED ORDER — DEXAMETHASONE SODIUM PHOSPHATE 10 MG/ML IJ SOLN
INTRAMUSCULAR | Status: DC | PRN
  Administered 2020-03-24: 4 mg via INTRAVENOUS

## 2020-03-24 MED ORDER — EPHEDRINE SULFATE-NACL 50-0.9 MG/10ML-% IV SOSY
PREFILLED_SYRINGE | INTRAVENOUS | Status: DC | PRN
  Administered 2020-03-24: 10 mg via INTRAVENOUS

## 2020-03-24 MED ORDER — LIDOCAINE 2% (20 MG/ML) 5 ML SYRINGE
INTRAMUSCULAR | Status: DC | PRN
  Administered 2020-03-24: 40 mg via INTRAVENOUS

## 2020-03-24 MED ORDER — FLUTICASONE FUROATE-VILANTEROL 100-25 MCG/INH IN AEPB
1.0000 | INHALATION_SPRAY | Freq: Every day | RESPIRATORY_TRACT | Status: DC
  Administered 2020-03-25 – 2020-03-28 (×3): 1 via RESPIRATORY_TRACT
  Filled 2020-03-24: qty 28

## 2020-03-24 MED ORDER — ALUM & MAG HYDROXIDE-SIMETH 200-200-20 MG/5ML PO SUSP: 15.0000 mL | ORAL | Status: DC | PRN

## 2020-03-24 MED ORDER — PHENYLEPHRINE 40 MCG/ML (10ML) SYRINGE FOR IV PUSH (FOR BLOOD PRESSURE SUPPORT)
PREFILLED_SYRINGE | INTRAVENOUS | Status: AC
  Filled 2020-03-24: qty 10

## 2020-03-24 MED ORDER — SENNOSIDES-DOCUSATE SODIUM 8.6-50 MG PO TABS
1.0000 | ORAL_TABLET | Freq: Every evening | ORAL | Status: DC | PRN
  Administered 2020-03-26: 1 via ORAL
  Filled 2020-03-24: qty 1

## 2020-03-24 MED ORDER — ACETAMINOPHEN 10 MG/ML IV SOLN: 1000.0000 mg | Freq: Once | INTRAVENOUS | Status: DC | PRN

## 2020-03-24 MED ORDER — POTASSIUM CHLORIDE CRYS ER 20 MEQ PO TBCR: 20.0000 meq | EXTENDED_RELEASE_TABLET | Freq: Every day | ORAL | Status: DC | PRN

## 2020-03-24 MED ORDER — CHLORHEXIDINE GLUCONATE CLOTH 2 % EX PADS: 6.0000 | MEDICATED_PAD | Freq: Every day | CUTANEOUS | Status: DC

## 2020-03-24 MED ORDER — HYDRALAZINE HCL 20 MG/ML IJ SOLN
INTRAMUSCULAR | Status: AC
  Administered 2020-03-24: 10 mg via INTRAVENOUS
  Filled 2020-03-24: qty 1

## 2020-03-24 MED ORDER — PROTAMINE SULFATE 10 MG/ML IV SOLN
INTRAVENOUS | Status: DC | PRN
Start: 2020-03-24 — End: 2020-03-24
  Administered 2020-03-24: 50 mg via INTRAVENOUS

## 2020-03-24 MED ORDER — MAGNESIUM SULFATE 2 GM/50ML IV SOLN: 2.0000 g | Freq: Every day | INTRAVENOUS | Status: DC | PRN

## 2020-03-24 MED ORDER — FENTANYL CITRATE (PF) 100 MCG/2ML IJ SOLN
INTRAMUSCULAR | Status: DC | PRN
  Administered 2020-03-24 (×6): 50 ug via INTRAVENOUS
  Administered 2020-03-24: 100 ug via INTRAVENOUS

## 2020-03-24 MED ORDER — SODIUM CHLORIDE 0.9 % IV SOLN
INTRAVENOUS | Status: AC
  Filled 2020-03-24: qty 1.2

## 2020-03-24 MED ORDER — PHENOL 1.4 % MT LIQD: 1.0000 | OROMUCOSAL | Status: DC | PRN

## 2020-03-24 SURGICAL SUPPLY — 84 items
BAG SNAP BAND KOVER 36X36 (MISCELLANEOUS) ×5 IMPLANT
BANDAGE ESMARK 6X9 LF (GAUZE/BANDAGES/DRESSINGS) IMPLANT
BLADE CLIPPER SURG (BLADE) ×5 IMPLANT
BNDG ESMARK 6X9 LF (GAUZE/BANDAGES/DRESSINGS)
CANISTER SUCT 3000ML PPV (MISCELLANEOUS) ×5 IMPLANT
CANNULA VESSEL 3MM 2 BLNT TIP (CANNULA) ×5 IMPLANT
CATH BEACON 5 .035 65 KMP TIP (CATHETERS) ×5 IMPLANT
CATH OMNI FLUSH 5F 65CM (CATHETERS) ×5 IMPLANT
CHLORAPREP W/TINT 26 (MISCELLANEOUS) ×5 IMPLANT
CLIP FOGARTY SPRING 6M (CLIP) IMPLANT
CLIP VESOCCLUDE MED 24/CT (CLIP) ×5 IMPLANT
CLIP VESOCCLUDE SM WIDE 24/CT (CLIP) ×5 IMPLANT
COVER DOME SNAP 22 D (MISCELLANEOUS) ×5 IMPLANT
COVER PROBE W GEL 5X96 (DRAPES) ×10 IMPLANT
COVER SURGICAL LIGHT HANDLE (MISCELLANEOUS) ×5 IMPLANT
CUFF TOURN SGL QUICK 24 (TOURNIQUET CUFF)
CUFF TOURN SGL QUICK 34 (TOURNIQUET CUFF)
CUFF TOURN SGL QUICK 42 (TOURNIQUET CUFF) IMPLANT
CUFF TRNQT CYL 24X4X16.5-23 (TOURNIQUET CUFF) IMPLANT
CUFF TRNQT CYL 34X4.125X (TOURNIQUET CUFF) IMPLANT
DERMABOND ADVANCED (GAUZE/BANDAGES/DRESSINGS) ×4
DERMABOND ADVANCED .7 DNX12 (GAUZE/BANDAGES/DRESSINGS) ×6 IMPLANT
DRAIN CHANNEL 15F RND FF W/TCR (WOUND CARE) IMPLANT
DRAPE C-ARM 42X72 X-RAY (DRAPES) ×5 IMPLANT
DRAPE HALF SHEET 40X57 (DRAPES) IMPLANT
ELECT REM PT RETURN 9FT ADLT (ELECTROSURGICAL) ×5
ELECTRODE REM PT RTRN 9FT ADLT (ELECTROSURGICAL) ×3 IMPLANT
EVACUATOR SILICONE 100CC (DRAIN) IMPLANT
GAUZE 4X4 16PLY RFD (DISPOSABLE) ×5 IMPLANT
GLIDEWIRE ADV .035X260CM (WIRE) ×5 IMPLANT
GLOVE BIO SURGEON STRL SZ 6.5 (GLOVE) ×12 IMPLANT
GLOVE BIO SURGEON STRL SZ7.5 (GLOVE) ×15 IMPLANT
GLOVE BIO SURGEONS STRL SZ 6.5 (GLOVE) ×3
GLOVE BIOGEL PI IND STRL 8 (GLOVE) ×6 IMPLANT
GLOVE BIOGEL PI INDICATOR 8 (GLOVE) ×4
GOWN STRL REUS W/ TWL LRG LVL3 (GOWN DISPOSABLE) ×9 IMPLANT
GOWN STRL REUS W/ TWL XL LVL3 (GOWN DISPOSABLE) ×6 IMPLANT
GOWN STRL REUS W/TWL LRG LVL3 (GOWN DISPOSABLE) ×15
GOWN STRL REUS W/TWL XL LVL3 (GOWN DISPOSABLE) ×10
HEMOSTAT SNOW SURGICEL 2X4 (HEMOSTASIS) ×5 IMPLANT
HEMOSTAT SPONGE AVITENE ULTRA (HEMOSTASIS) IMPLANT
INSERT FOGARTY SM (MISCELLANEOUS) ×5 IMPLANT
KIT BASIN OR (CUSTOM PROCEDURE TRAY) ×5 IMPLANT
KIT TURNOVER KIT B (KITS) ×5 IMPLANT
LOOP VESSEL MINI RED (MISCELLANEOUS) ×10 IMPLANT
NS IRRIG 1000ML POUR BTL (IV SOLUTION) ×10 IMPLANT
PACK ENDO MINOR (CUSTOM PROCEDURE TRAY) ×5 IMPLANT
PACK PERIPHERAL VASCULAR (CUSTOM PROCEDURE TRAY) ×5 IMPLANT
PAD ARMBOARD 7.5X6 YLW CONV (MISCELLANEOUS) ×10 IMPLANT
PATCH VASC XENOSURE 1CMX6CM (Vascular Products) ×5 IMPLANT
PATCH VASC XENOSURE 1X6 (Vascular Products) ×3 IMPLANT
SET MICROPUNCTURE 5F STIFF (MISCELLANEOUS) ×5 IMPLANT
SHEATH BRITE TIP 7FRX11 (SHEATH) ×5 IMPLANT
SHEATH PINNACLE 5F 10CM (SHEATH) ×5 IMPLANT
STENT VIABAHNBX 7X29X135 (Permanent Stent) ×5 IMPLANT
STENT VIABAHNBX 7X39X136 (Permanent Stent) ×5 IMPLANT
STOPCOCK 4 WAY LG BORE MALE ST (IV SETS) IMPLANT
STOPCOCK MORSE 400PSI 3WAY (MISCELLANEOUS) ×5 IMPLANT
SUT ETHILON 3 0 PS 1 (SUTURE) IMPLANT
SUT GORETEX 5 0 TT13 24 (SUTURE) IMPLANT
SUT GORETEX 6.0 TT13 (SUTURE) IMPLANT
SUT MNCRL AB 4-0 PS2 18 (SUTURE) ×20 IMPLANT
SUT PROLENE 5 0 C 1 24 (SUTURE) ×50 IMPLANT
SUT PROLENE 6 0 BV (SUTURE) ×35 IMPLANT
SUT PROLENE 7 0 BV 1 (SUTURE) IMPLANT
SUT SILK 2 0 PERMA HAND 18 BK (SUTURE) IMPLANT
SUT SILK 3 0 (SUTURE) ×15
SUT SILK 3-0 18XBRD TIE 12 (SUTURE) ×9 IMPLANT
SUT VIC AB 2-0 CT1 27 (SUTURE) ×15
SUT VIC AB 2-0 CT1 TAPERPNT 27 (SUTURE) ×9 IMPLANT
SUT VIC AB 3-0 SH 27 (SUTURE) ×25
SUT VIC AB 3-0 SH 27X BRD (SUTURE) ×15 IMPLANT
SYR 10ML LL (SYRINGE) ×15 IMPLANT
SYR MEDRAD MARK V 150ML (SYRINGE) ×5 IMPLANT
TAPE UMBILICAL COTTON 1/8X30 (MISCELLANEOUS) IMPLANT
TOWEL GREEN STERILE (TOWEL DISPOSABLE) ×5 IMPLANT
TRAY FOLEY MTR SLVR 16FR STAT (SET/KITS/TRAYS/PACK) ×5 IMPLANT
TUBING EXTENTION W/L.L. (IV SETS) IMPLANT
TUBING HIGH PRESSURE 120CM (CONNECTOR) IMPLANT
TUBING INJECTOR 48 (MISCELLANEOUS) ×5 IMPLANT
UNDERPAD 30X36 HEAVY ABSORB (UNDERPADS AND DIAPERS) ×5 IMPLANT
WATER STERILE IRR 1000ML POUR (IV SOLUTION) ×5 IMPLANT
WIRE BENTSON .035X145CM (WIRE) ×5 IMPLANT
WIRE STARTER BENTSON 035X150 (WIRE) ×5 IMPLANT

## 2020-03-24 NOTE — Discharge Instructions (Signed)
 [facility]  Discharge instructions  Lower Extremity Bypass Surgery  Please refer to the following instruction for your post-procedure care. Your surgeon or physician assistant will discuss any changes with you.  Activity  You are encouraged to walk as much as you can. You can slowly return to normal activities during the month after your surgery. Avoid strenuous activity and heavy lifting until your doctor tells you it's OK. Avoid activities such as vacuuming or swinging a golf club. Do not drive until your doctor give the OK and you are no longer taking prescription pain medications. It is also normal to have difficulty with sleep habits, eating and bowel movement after surgery. These will go away with time.  Bathing/Showering  Shower daily after you go home. Do not soak in a bathtub, hot tub, or swim until the incision heals completely.  Incision Care  Clean your incision with mild soap and water. Shower every day. Pat the area dry with a clean towel. You do not need a bandage unless otherwise instructed. Do not apply any ointments or creams to your incision. If you have open wounds you will be instructed how to care for them or a visiting nurse may be arranged for you. If you have staples or sutures along your incision they will be removed at your post-op appointment. You may have skin glue on your incision. Do not peel it off. It will come off on its own in about one week.  Wash the groin wound with soap and water daily and pat dry. (No tub bath-only shower)  Then put a dry gauze or washcloth in the groin to keep this area dry to help prevent wound infection.  Do this daily and as needed.  Do not use Vaseline or neosporin on your incisions.  Only use soap and water on your incisions and then protect and keep dry.  Diet  Resume your normal diet. There are no special food restrictions following this procedure. A low fat/ low cholesterol diet is  recommended for all patients with vascular disease. In order to heal from your surgery, it is CRITICAL to get adequate nutrition. Your body requires vitamins, minerals, and protein. Vegetables are the best source of vitamins and minerals. Vegetables also provide the perfect balance of protein. Processed food has little nutritional value, so try to avoid this.  Medications  Resume taking all your medications unless your doctor or physician assistant tells you not to. If your incision is causing pain, you may take over-the-counter pain relievers such as acetaminophen (Tylenol). If you were prescribed a stronger pain medication, please aware these medication can cause nausea and constipation. Prevent nausea by taking the medication with a snack or meal. Avoid constipation by drinking plenty of fluids and eating foods with high amount of fiber, such as fruits, vegetables, and grains. Take Colace 100 mg (an over-the-counter stool softener) twice a day as needed for constipation.  Do not take Tylenol if you are taking prescription pain medications.  Follow Up  Our office will schedule a follow up appointment 2-3 weeks following discharge.  Please call us immediately for any of the following conditions  Severe or worsening pain in your legs or feet while at rest or while walking Increase pain, redness, warmth, or drainage (pus) from your incision site(s) Fever of 101 degree or higher The swelling in your leg with the bypass suddenly worsens and becomes more painful than when you were in the hospital If you have   been instructed to feel your graft pulse then you should do so every day. If you can no longer feel this pulse, call the office immediately. Not all patients are given this instruction.  Leg swelling is common after leg bypass surgery.  The swelling should improve over a few months following surgery. To improve the swelling, you may elevate your legs above the level of your heart while you are  sitting or resting. Your surgeon or physician assistant may ask you to apply an ACE wrap or wear compression (TED) stockings to help to reduce swelling.  Reduce your risk of vascular disease  Stop smoking. If you would like help call [facility] at 1-800-QUIT-NOW (1-800-784-8669) or [place] at [phone].  Manage your cholesterol Maintain a desired weight Control your diabetes weight Control your diabetes Keep your blood pressure down  If you have any questions, please call the office at [phone]  

## 2020-03-24 NOTE — Op Note (Signed)
Date: March 24, 2020  Preoperative diagnosis: Critical limb ischemia of the left lower extremity with tissue loss and gangrenous changes to toes 1 through 5  Postoperative diagnosis: Same  Procedure: 1.  Left common femoral endarterectomy with profundoplasty including endarterectomy of the distal left external iliac artery with bovine pericardial patch angioplasty 2.  Left iliac arteriogram 3.  Retrograde left external iliac artery angioplasty with stent placement (7 mm x 29 mm VBX and 7 mm x 39 mm VBX) 4.  Harvest of left great saphenous vein 5.  Left common femoral to below-knee popliteal artery bypass with ipsilateral non-reversed great saphenous vein tunneled subfascial and subsartorial   Surgeon: Dr. Marty Heck, MD  Assistant: Noland Fordyce, PA  Indications: Patient is a 64 year old female who recently presented with critical limb ischemia of the left lower extremity with gangrenous changes to all 5 toes.  Ultimately in the office she had ABIs of 0 although she did have peroneal signal.  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  She presents today after risk benefits discussed including high risk for limb loss even with maximal intervention.  An assistant was needed for exposure and to expedite the case.    Findings: Left common femoral endarterectomy was initially performed with eversion endarterectomy of the profunda and I carried the endarterectomy onto the distal external iliac.  There was no inflow on the left given external iliac occlusion.  After patch angioplasty was performed with bovine pericardial patch in the left groin, I then accessed the patch retrograde with a micro access needle and ultimately placed a 7 French sheath and was able to cross her external iliac  occlusion retrograde and confirmed I was in the true lumen on hand-injection.  The left external iliac artery was then stented with a 7 mm x 29 mm VBX and 7 mm x 39 mm VBX.  Retrograde injection showed no evidence of residual stenosis with inline flow down the left iliac into the common femoral patch.  I then found a nice saphenous vein in the left leg even though vein mapping suggested no usable saphenous vein.  The saphenous vein was harvested from the groin all the way down to below the knee.  A left common femoral to below-knee popliteal bypass was tunneled in nonreversed fashion in the subsartorial subfascial space.  All the valves were lysed with a valvulotome.  She had a very brisk peroneal signal in the left foot at completion of the case.  Anesthesia: General  Details: Patient was taken to the operating room after informed consent was obtained.  Placed on operative table in supine position.  General endotracheal anesthesia was induced.  She had preoperative antibiotics.  Both groins and left leg were prepped and draped in the usual sterile fashion.  Initially made a vertical groin incision over her left common femoral artery.  Ultimately dissected down with Bovie cautery opened the femoral sheath and got out the SFA and profunda that were controlled with Vesseloops and dissected well under the inguinal ligament to get as proximal on the external iliac as possible.  Ultimately the patient was given 100 units/kg heparin and ACT was checked to maintain greater than 250.  Initially opened the left common femoral artery with 11 blade scalpel extended with Potts scissors.  There was no inflow on the left obviously given the  left external iliac occlusion.  The [facility] was then used to create a nice endarterectomy plane in the common femoral artery and the profunda was endarterectomized with eversion technique and I also endarterectomized the proximal SFA for future endovascular options.  I then  carried the endarterectomy proximal and using a [place] elevator was able to endarterectomized a large portion of the external iliac.  Ultimately the endarterectomized portion of the artery was passed off the field but I still had no inflow.  Bovine pericardial patch was then trimmed and sewn to the left common femoral artery with 5-0 Prolene parachute technique.  I then accessed the patch retrograde with a micro access needle placed a microwire and then a micro sheath and then placed a Bentson wire and exchanged for a short 7 [place] sheath.  I then was able to cross the external iliac occlusion retrograde with a Bentson wire and a KMP catheter and appeared to stay in the true lumen and confirmed on hand-injection I was in the true lumen of the left common iliac as well as the aorta.  That point time we then selected a 7 mm VBX stents and initially deployed a 7 mm x 29 mm VBC in the distal left external iliac into the patch and then a second 7 mm x 39 mm VBX more proximal while preserving the hypogastric artery.  Retrograde injection showed that we had a nice inline flow down the left iliac and a palpable left femoral pulse.  I then used ultrasound guidance and evaluated the left leg and found a usable saphenous vein that was marked.  I then made four skip incisions and through this I circumferentially mobilized the left saphenous vein and all side branches were ligated between 3-0 silk ties and divided.  It was mobilized from below the knee all the way up to the saphenofemoral junction.  Once I felt like I had enough vein for a below-knee popliteal target, I then transected the vein distally on a right angle clamp and this was tied off with 2-0 silk.  The vein was passed through the tunnels then the saphenofemoral junction was clamped with a right angle clamp the vein was transected passed off the field and then oversewn with a 5-0 Prolene at the saphenofemoral junction.  I then opened the fascia to gain access to  the popliteal space in the below-knee popliteal region.  The popliteal artery was then mobilized off the popliteal veins using Meyerding retractors for added visualization.  I was able to get Vesseloops proximally distally in the below-knee popliteal artery and this appeared to be a soft target for sewing.  I then used a curved Gore tunneler and tunneled from the below-knee popliteal artery up to the common femoral artery in the left leg in the subfascial subsartorial space.  I then placed a vessel cannula after the vein was reversed and this was pressurized and several branches had to be repaired with 6-0 Prolene.  Once I was happy with the vein that appeared to dilate nicely I then used a Cooley clamp and got control of the left common femoral artery where we had placed a patch with bovine pericardium.  This was opened with 11 blade scalpel Potts scissors.  I then spatulated the end of the vein bypass in nonreversed fashion this was sewn end-to-side to the common femoral with 5-0 Prolene parachute technique.  Initially had no pulsatile flow down the bypass given this was nonreversed.  I then used a valvulotome and  carefully lysed all the valves and then we had excellent pulsatile bleeding down the bypass.  I then marked the vein for orientation and it was sewn to the tunneler that had previously been tunneled.  We then carefully passed the vein through the tunneler in the subfascial subsartorial space to the below-knee popliteal artery.  We then straightened the leg and the vein was cut to the appropriate length.  We had good pulsatile inflow suggesting no twisting.  I then pulled up on my Vesseloops on the below-knee popliteal artery and opened this with 11blade scalpel extended with Pott scissors.  I then spatulated the vein and a end-to-side anastomosis was sewn to the below-knee popliteal artery with 6-0 Prolene in parachute technique.  We de-aired everything prior to completion.  Once we came off clamps there  was a good peroneal signal at the ankle.  Patient was given 50 mg protamine for reversal.  I then irrigated all the incisions.  The groin was closed with multiple layers of 2-0 Vicryl 3-0 Vicryl 4-0 Monocryl Dermabond.  The below-knee popliteal exposure was closed with 2-0 Vicryl 3-0 Vicryl 4-0 Monocryl Dermabond.  All the other vein harvest incisions were closed with 3-0 Vicryl 4-0 Monocryl Dermabond.  Taken to recovery in stable condition with brisk left peroneal signal.  Complication: None  Condition: Stable  Marty Heck, MD [facility] [place] Office: [place]

## 2020-03-24 NOTE — Anesthesia Postprocedure Evaluation (Signed)
Anesthesia Post Note  Patient: Veronica Stewart  Procedure(s) Performed: LEFT COMMON FEMORAL ENDARTERECTOMY (Left Groin) INSERTION OF RETROGRADE ILIAC STENT (Left Groin) BYPASS GRAFT FEMORAL-POPLITEAL ARTERY (Left Leg Upper) INTRA OPERATIVE ARTERIOGRAM (Left Groin) HARVEST OF GREATER SAPHENOUS VEIN (Left Leg Upper)     Patient location during evaluation: PACU Anesthesia Type: General Level of consciousness: awake and alert, oriented and awake Pain management: pain level controlled Vital Signs Assessment: post-procedure vital signs reviewed and stable Respiratory status: spontaneous breathing, nonlabored ventilation, respiratory function stable and patient connected to nasal cannula oxygen Cardiovascular status: blood pressure returned to baseline and stable Postop Assessment: no apparent nausea or vomiting Anesthetic complications: no   No complications documented.  Last Vitals:  Vitals:   03/24/20 1850 03/24/20 1952  BP:    Pulse: 100   Resp: (!) 9   Temp:  (!) 36.1 C  SpO2: 98%     Last Pain:  Vitals:   03/24/20 1952  TempSrc:   PainSc: 0-No pain                 Catalina Gravel

## 2020-03-24 NOTE — Transfer of Care (Signed)
Immediate Anesthesia Transfer of Care Note  Patient: Veronica Stewart  Procedure(s) Performed: LEFT COMMON FEMORAL ENDARTERECTOMY (Left Groin) INSERTION OF RETROGRADE ILIAC STENT (Left Groin) BYPASS GRAFT FEMORAL-POPLITEAL ARTERY (Left Leg Upper) INTRA OPERATIVE ARTERIOGRAM (Left Groin) HARVEST OF GREATER SAPHENOUS VEIN (Left Leg Upper)  Patient Location: PACU  Anesthesia Type:General  Level of Consciousness: awake and patient cooperative  Airway & Oxygen Therapy: Patient Spontanous Breathing and Patient connected to face mask oxygen  Post-op Assessment: Report given to RN, Post -op Vital signs reviewed and stable and Patient moving all extremities  Post vital signs: Reviewed and stable  Last Vitals:  Vitals Value Taken Time  BP 122/84 03/24/20 1807  Temp    Pulse 96 03/24/20 1808  Resp 12 03/24/20 1808  SpO2 100 % 03/24/20 1808  Vitals shown include unvalidated device data.  Last Pain:  Vitals:   03/24/20 0911  TempSrc:   PainSc: 7       Patients Stated Pain Goal: 3 (12/87/86 7672)  Complications: No complications documented.

## 2020-03-24 NOTE — Anesthesia Procedure Notes (Signed)
Arterial Line Insertion Start/End8/04/2020 1:55 PM, 03/24/2020 2:00 PM Performed by: Lowella Dell, CRNA, CRNA  Preanesthetic checklist: patient identified, IV checked, site marked, monitors and equipment checked, pre-op evaluation, timeout performed and anesthesia consent Patient sedated Right, radial was placed Catheter size: 20 G Hand hygiene performed  and maximum sterile barriers used   Attempts: 2 (x1 attempt by SRNA followed by x1 attempt by CRNA) Procedure performed without using ultrasound guided technique. Following insertion, Biopatch and dressing applied. Post procedure assessment: normal  Patient tolerated the procedure well with no immediate complications.

## 2020-03-24 NOTE — Progress Notes (Signed)
Verbal orders received from Dr. Ermalene Postin pertaining to elevated BP.

## 2020-03-24 NOTE — H&P (Signed)
History and Physical Interval Note:  03/24/2020 12:43 PM  [name]  has presented today for surgery, with the diagnosis of CRITICAL LOWER LIMB ISCHEMIA.  The various methods of treatment have been discussed with the patient and family. After consideration of risks, benefits and other options for treatment, the patient has consented to  Procedure(s): LEFT COMMON FEMORAL ENDARTERECTOMY (Left) INSERTION OF RETROGRADE ILIAC STENT (N/A) BYPASS GRAFT FEMORAL-POPLITEAL ARTERY (Left) BYPASS GRAFT FEMORAL-FEMORAL ARTERY (N/A) as a surgical intervention.  The patient's history has been reviewed, patient examined, no change in status, stable for surgery.  I have reviewed the patient's chart and labs.  Questions were answered to the patient's satisfaction.    Critical limb ischemia with tissue loss in the left foot.  She has left external iliac occlusion and flush SFA occlusion on the left.  Discussed with her option of proceeding with primary amputation given she walks with a walker and extensive risk with surgery of still requiring amputation.  She wants to proceed and would need left femoral endarterectomy with attempted iliac stenting and a femoropopliteal bypass with likely prostatic given no vein on vein mapping.  Bailout would be a femorofemoral as discussed with the patient.  Marty Heck  Patient name: Veronica Stewart         MRN: [id number]        DOB: Feb 25, 1956          Sex: female  REASON FOR CONSULT: Pain and tissue loss left foot  HPI: Veronica Stewart is a 64 y.o. female, with history of hypertension and tobacco abuse that presents for evaluation of PAD in the left lower extremity.  She describes having severe pain in the left foot all the time even at night since at least March of this year.  She has also had a left fifth toenail fall off with a small ulceration.  She has been followed by Dr. Earleen Newport with [facility] and ultimately he made a referral here to  vascular surgery.  She states her right foot is doing fine and she is only having issues with the left foot.  She denies any other medical problems other than hypertension.  Does smoke half pack a day and has smoked her entire life.  Does walk with a walker following a fall with a hip fracture back in March.  No previous lower extremity revascularization.      Past Medical History:  Diagnosis Date  . Asthma   . Hypertension          Past Surgical History:  Procedure Laterality Date  . INTRAMEDULLARY (IM) NAIL INTERTROCHANTERIC Left 03/18/2018   Procedure: INTRAMEDULLARY (IM) NAIL INTERTROCHANTRIC;  Surgeon: Shona Needles, MD;  Location: [facility];  Service: Orthopedics;  Laterality: Left;         Family History  Problem Relation Age of Onset  . Sudden Cardiac Death Neg Hx     SOCIAL HISTORY: Social History        Socioeconomic History  . Marital status: Legally Separated    Spouse name: Not on file  . Number of children: Not on file  . Years of education: Not on file  . Highest education level: Not on file  Occupational History  . Not on file  Tobacco Use  . Smoking status: Current Every Day Smoker    Packs/day: 0.50    Types: Cigarettes  . Smokeless tobacco: Never Used  Substance and Sexual Activity  . Alcohol use: Yes  Comment: two 40 oz beers most days   . Drug use: Yes    Types: Marijuana  . Sexual activity: Not on file  Other Topics Concern  . Not on file  Social History Narrative  . Not on file   Social Determinants of Health      Financial Resource Strain:   . Difficulty of Paying Living Expenses:   Food Insecurity:   . Worried About Charity fundraiser in the Last Year:   . Arboriculturist in the Last Year:   Transportation Needs:   . Film/video editor (Medical):   Marland Kitchen Lack of Transportation (Non-Medical):   Physical Activity:   . Days of Exercise per Week:   . Minutes of Exercise per Session:   Stress:   . Feeling of  Stress :   Social Connections:   . Frequency of Communication with Friends and Family:   . Frequency of Social Gatherings with Friends and Family:   . Attends Religious Services:   . Active Member of Clubs or Organizations:   . Attends Archivist Meetings:   Marland Kitchen Marital Status:   Intimate Partner Violence:   . Fear of Current or Ex-Partner:   . Emotionally Abused:   Marland Kitchen Physically Abused:   . Sexually Abused:     No Known Allergies        Current Outpatient Medications  Medication Sig Dispense Refill  . albuterol (VENTOLIN HFA) 108 (90 Base) MCG/ACT inhaler INHALE 2 PUFFS BY MOUTH EVERY 6 HOURS AS NEEDED FOR WHEEZING AND SHORTNESS OF BREATH 8.5 g 2  . cholecalciferol 2000 units TABS Take 1 tablet (2,000 Units total) by mouth daily. 30 tablet 1  . Fluticasone-Salmeterol (ADVAIR) 100-50 MCG/DOSE AEPB Inhale 1 puff into the lungs 2 (two) times daily. 1 each 3  . mupirocin ointment (BACTROBAN) 2 % Apply 1 application topically 2 (two) times daily. 30 g 2  . Nutritional Supplements (ENSURE ACTIVE HIGH PROTEIN) LIQD Take 237 oz by mouth 2 (two) times daily. 2964 mL 10  . PROLIA 60 MG/ML SOSY injection Inject into the skin as directed.    . Vitamin D, Ergocalciferol, (DRISDOL) 1.25 MG (50000 UNIT) CAPS capsule Take 50,000 Units by mouth 2 (two) times a week.    . doxycycline (VIBRA-TABS) 100 MG tablet Take 1 tablet (100 mg total) by mouth 2 (two) times daily. (Patient not taking: Reported on 03/04/2020) 20 tablet 0            Current Facility-Administered Medications  Medication Dose Route Frequency Provider Last Rate Last Admin  . albuterol (PROVENTIL) (2.5 MG/3ML) 0.083% nebulizer solution 2.5 mg  2.5 mg Nebulization Once Kerin Perna, NP        REVIEW OF SYSTEMS:  [X]  denotes positive finding, [ ]  denotes negative finding Cardiac  Comments:  Chest pain or chest pressure:    Shortness of breath upon exertion:    Short of breath when lying flat:      Irregular heart rhythm:        Vascular    Pain in calf, thigh, or hip brought on by ambulation:    Pain in feet at night that wakes you up from your sleep:     Blood clot in your veins:    Leg swelling:         Pulmonary    Oxygen at home:    Productive cough:     Wheezing:         Neurologic  Sudden weakness in arms or legs:     Sudden numbness in arms or legs:     Sudden onset of difficulty speaking or slurred speech:    Temporary loss of vision in one eye:     Problems with dizziness:         Gastrointestinal    Blood in stool:     Vomited blood:         Genitourinary    Burning when urinating:     Blood in urine:        Psychiatric    Major depression:         Hematologic    Bleeding problems:    Problems with blood clotting too easily:        Skin    Rashes or ulcers:        Constitutional    Fever or chills:      PHYSICAL EXAM:    Vitals:   03/04/20 0900  BP: (!) 164/118  Pulse: 93  Resp: 16  Temp: (!) 97.2 F (36.2 C)  TempSrc: Temporal  SpO2: 100%  Weight: 102 lb (46.3 kg)  Height: 5\' 9"  (1.753 m)    GENERAL: The patient is a well-nourished female, in no acute distress. The vital signs are documented above. CARDIAC: There is a regular rate and rhythm.  VASCULAR:  Right femoral pulse palpable Left femoral pulse nonpalpable No palpable pedal pulses PULMONARY: There is good air exchange bilaterally without wheezing or rales. ABDOMEN: Soft and non-tender with normal pitched bowel sounds.  MUSCULOSKELETAL: There are no major deformities or cyanosis. NEUROLOGIC: No focal weakness or paresthesias are detected. SKIN: There are no ulcers or rashes noted. PSYCHIATRIC: The patient has a normal affect.      DATA:   ABIs today were 0.81 on the right monophasic and absent on the left  Assessment/Plan:  64 year old female with history of  hypertension and tobacco abuse that presents for evaluation of critical limb ischemia with tissue loss in the left lower extremity.  She describes severe rest pain in the foot since at least March and now has a ulceration on her left fifth toe after toenail fell off.  Unable to obtain any ABIs in clinic today.  She does remain motor and sensory intact.  I recommended left lower extremity arteriogram with possible intervention tomorrow in the Cath Lab.  We discussed that she may ultimately require bypass if there are no endovascular options.  She is certainly at high risk for limb loss.  Risks and benefits discussed in detail.   Marty Heck, MD [facility] Office: [phone]

## 2020-03-24 NOTE — Progress Notes (Signed)
Elevated BP, no c/o dizziness, pain, or blurred vision. Dr. Ermalene Postin made aware. Verbal orders received.

## 2020-03-24 NOTE — Anesthesia Procedure Notes (Addendum)
Procedure Name: Intubation Date/Time: 03/24/2020 1:04 PM Performed by: Lowella Dell, CRNA Pre-anesthesia Checklist: Patient identified, Emergency Drugs available, Suction available and Patient being monitored Patient Re-evaluated:Patient Re-evaluated prior to induction Oxygen Delivery Method: Circle System Utilized Preoxygenation: Pre-oxygenation with 100% oxygen Induction Type: IV induction Ventilation: Mask ventilation without difficulty Laryngoscope Size: Miller and 2 Grade View: Grade I Tube type: Oral Tube size: 7.0 mm Number of attempts: 1 Airway Equipment and Method: Stylet Placement Confirmation: ETT inserted through vocal cords under direct vision,  positive ETCO2 and breath sounds checked- equal and bilateral Secured at: 22 cm Tube secured with: Tape Dental Injury: Teeth and Oropharynx as per pre-operative assessment  Comments: Airway performed by Charlies Constable

## 2020-03-24 NOTE — Progress Notes (Signed)
BP rechecked, still elevated. Dr. Ermalene Postin aware, no new orders given

## 2020-03-24 NOTE — Anesthesia Preprocedure Evaluation (Signed)
Anesthesia Evaluation  Patient identified by MRN, date of birth, ID band Patient awake    Reviewed: Allergy & Precautions, NPO status , Patient's Chart, lab work & pertinent test results  History of Anesthesia Complications Negative for: history of anesthetic complications  Airway Mallampati: II  TM Distance: >3 FB Neck ROM: Full    Dental  (+) Missing, Loose, Poor Dentition, Chipped,    Pulmonary asthma , neg recent URI, Current SmokerPatient did not abstain from smoking.,    breath sounds clear to auscultation       Cardiovascular hypertension, + Peripheral Vascular Disease   Rhythm:Regular     Neuro/Psych negative neurological ROS  negative psych ROS   GI/Hepatic negative GI ROS,   Endo/Other  negative endocrine ROS  Renal/GU negative Renal ROS     Musculoskeletal negative musculoskeletal ROS (+)   Abdominal   Peds  Hematology negative hematology ROS (+)   Anesthesia Other Findings Uncontrolled HTN preop with DBP 110-130. Discussed with Dr Carlis Abbott.  Given limb threatening ischemia  preop BP optimization would lead to limb loss. Decision to proceed with preop BP improvement. Discussed with patient who agrees  Reproductive/Obstetrics                             Anesthesia Physical Anesthesia Plan  ASA: III  Anesthesia Plan: General   Post-op Pain Management:    Induction: Intravenous  PONV Risk Score and Plan: 2 and Ondansetron and Dexamethasone  Airway Management Planned: Oral ETT  Additional Equipment: Arterial line  Intra-op Plan:   Post-operative Plan: Extubation in OR  Informed Consent: I have reviewed the patients History and Physical, chart, labs and discussed the procedure including the risks, benefits and alternatives for the proposed anesthesia with the patient or authorized representative who has indicated his/her understanding and acceptance.     Dental advisory  given  Plan Discussed with: CRNA and Surgeon  Anesthesia Plan Comments:         Anesthesia Quick Evaluation

## 2020-03-24 NOTE — Progress Notes (Signed)
@  approx 2226 pt HR increased from ST 100s to ST vs. SVT 150s-170s sustained. All other VSS were unchanged from prior assessment. Pt was not aware of tachycardia and denied palpitations or a sensation of feeling like her heart was racing. Rate and rhythm confirmed by 12 lead EKG, and PRN IV Lopressor 2mg  administered per standing order. HR returned to NSR 80s-90s @approx . 2245. Will convey to Day RN and will notify on-call physician if reoccurrence.

## 2020-03-25 ENCOUNTER — Encounter (HOSPITAL_COMMUNITY): Payer: Self-pay | Admitting: Vascular Surgery

## 2020-03-25 ENCOUNTER — Encounter (HOSPITAL_COMMUNITY): Payer: Medicaid Other

## 2020-03-25 ENCOUNTER — Ambulatory Visit: Payer: Medicaid Other | Admitting: Podiatry

## 2020-03-25 LAB — LIPID PANEL
Cholesterol: 89 mg/dL (ref 0–200)
HDL: 44 mg/dL (ref >40)
LDL Cholesterol: 39 mg/dL (ref 0–99)
Total CHOL/HDL Ratio: 2 RATIO
Triglycerides: 31 mg/dL (ref <150)
VLDL: 6 mg/dL (ref 0–40)

## 2020-03-25 LAB — CBC
HCT: 29.1 % — ABNORMAL LOW (ref 36.0–46.0)
Hemoglobin: 9.5 g/dL — ABNORMAL LOW (ref 12.0–15.0)
MCH: 28.9 pg (ref 26.0–34.0)
MCHC: 32.6 g/dL (ref 30.0–36.0)
MCV: 88.4 fL (ref 80.0–100.0)
Platelets: 248 10*3/uL (ref 150–400)
RBC: 3.29 MIL/uL — ABNORMAL LOW (ref 3.87–5.11)
RDW: 15.8 % — ABNORMAL HIGH (ref 11.5–15.5)
WBC: 8.6 10*3/uL (ref 4.0–10.5)
nRBC: 0 % (ref 0.0–0.2)

## 2020-03-25 LAB — COMPREHENSIVE METABOLIC PANEL
ALT: 10 U/L (ref 0–44)
AST: 14 U/L — ABNORMAL LOW (ref 15–41)
Albumin: 3.3 g/dL — ABNORMAL LOW (ref 3.5–5.0)
Alkaline Phosphatase: 35 U/L — ABNORMAL LOW (ref 38–126)
Anion gap: 13 (ref 5–15)
BUN: 14 mg/dL (ref 8–23)
CO2: 21 mmol/L — ABNORMAL LOW (ref 22–32)
Calcium: 8 mg/dL — ABNORMAL LOW (ref 8.9–10.3)
Chloride: 100 mmol/L (ref 98–111)
Creatinine, Ser: 0.82 mg/dL (ref 0.44–1.00)
GFR calc Af Amer: >60 mL/min (ref >60)
GFR calc non Af Amer: >60 mL/min (ref >60)
Glucose, Bld: 128 mg/dL — ABNORMAL HIGH (ref 70–99)
Potassium: 4.1 mmol/L (ref 3.5–5.1)
Sodium: 134 mmol/L — ABNORMAL LOW (ref 135–145)
Total Bilirubin: 0.8 mg/dL (ref 0.3–1.2)
Total Protein: 6.1 g/dL — ABNORMAL LOW (ref 6.5–8.1)

## 2020-03-25 MED ORDER — ORAL CARE MOUTH RINSE
15.0000 mL | Freq: Two times a day (BID) | OROMUCOSAL | Status: DC
  Administered 2020-03-27 – 2020-03-28 (×2): 15 mL via OROMUCOSAL

## 2020-03-25 NOTE — Progress Notes (Signed)
Mobility Specialist - Progress Note   03/25/20 1632  Mobility  Activity Transferred to/from [facility]  Level of Assistance Moderate assist, patient does 50-74%  Assistive Device None  Mobility Response Tolerated well  Mobility performed by Mobility specialist  $Mobility charge 1 Mobility    Pt requested assistance to [facility] as she urgently needed to urinate. She required mod assist w/ standing up and transferring to/from [facility].    Pricilla Handler Mobility Specialist Mobility Specialist Phone: [phone]

## 2020-03-25 NOTE — Evaluation (Signed)
Physical Therapy Evaluation Patient Details Name: Veronica Stewart MRN: [id number] DOB: [birth year] Today's Date: 03/25/2020   History of Present Illness  Patient is a 64 y/o female who presents s/p Left common femoral endarterectomy and Left common femoral to below-knee popliteal artery bypass 03/24/20. PMH includes HTN, alcohol use, asthma, femur fx s/p IM nailing.  Clinical Impression  Patient presents with LLE pain and post surgical deficits s/p above. Pt reports living with Aunt and is Mod I for ADLs and ambulation using RW PTA. Reports hx of falls. Today, pt requires Mod A to stand from low chair surface and Min guard for safe ambulation with use of RW. HR ranged from 80s-126 bpm with activity and appropriate BP response to activity. Instructed pt in there ex and stretching to perform with LLE. Question some cognitive deficits relating to awareness and safety. Will follow acutely to maximize independence and mobility prior to return home.    Follow Up Recommendations Home health PT;Supervision for mobility/OOB    Equipment Recommendations  None recommended by PT    Recommendations for Other Services       Precautions / Restrictions Precautions Precautions: Fall Precaution Comments: watch HR Restrictions Weight Bearing Restrictions: No      Mobility  Bed Mobility               General bed mobility comments: Up in chair upon PT arrival.  Transfers Overall transfer level: Needs assistance Equipment used: Rolling walker (2 wheeled) Transfers: Sit to/from Stand Sit to Stand: Mod assist;Supervision         General transfer comment: Assist to power to standing from low chair with difficulty transitioning UEs from arm rest to walker, needing assist to maintain balance; supervision to stand from elevated commode seat.  Ambulation/Gait Ambulation/Gait assistance: Min guard Gait Distance (Feet): 20 Feet (x2 bouts) Assistive device: Rolling walker (2 wheeled) Gait  Pattern/deviations: Step-to pattern;Decreased step length - left;Narrow base of support;Trunk flexed Gait velocity: decreased Gait velocity interpretation: <1.31 ft/sec, indicative of household ambulator General Gait Details: Slow, mostly steady gait with narrow BoS and decreased knee extension during stance phase, decreased step lengths and flexed at hips.  Stairs            Wheelchair Mobility    Modified Rankin (Stroke Patients Only)       Balance Overall balance assessment: Needs assistance;History of Falls Sitting-balance support: Feet supported;No upper extremity supported Sitting balance-Leahy Scale: Fair     Standing balance support: During functional activity Standing balance-Leahy Scale: Poor Standing balance comment: Requires UE support ins tanding.                             Pertinent Vitals/Pain Pain Assessment: Faces Faces Pain Scale: Hurts a little bit Pain Location: LLE Pain Descriptors / Indicators: Operative site guarding Pain Intervention(s): Monitored during session;Repositioned    Home Living Family/patient expects to be discharged to:: Private residence Living Arrangements: Other relatives Engineer, petroleum) Available Help at Discharge: Family;Available PRN/intermittently Type of Home: House Home Access: Level entry     Home Layout: One level Home Equipment: Walker - 2 wheels;Shower seat;Bedside commode      Prior Function Level of Independence: Independent with assistive device(s)         Comments: Uses RW for ambulation, does own ADLs. Aunt does cooking. Keeps room clean. Does not drive.  Hx of falls.     Hand Dominance   Dominant Hand: Right    Extremity/Trunk  Assessment   Upper Extremity Assessment Upper Extremity Assessment: Defer to OT evaluation    Lower Extremity Assessment Lower Extremity Assessment: LLE deficits/detail;Generalized weakness LLE Deficits / Details: Decreased knee extension AROM and hip abduction LLE  Sensation: decreased light touch       Communication   Communication: No difficulties  Cognition Arousal/Alertness: Awake/alert Behavior During Therapy: WFL for tasks assessed/performed Overall Cognitive Status: Impaired/Different from baseline Area of Impairment: Orientation;Safety/judgement;Awareness;Problem solving                 Orientation Level: Disoriented to;Situation       Safety/Judgement: Decreased awareness of safety;Decreased awareness of deficits Awareness: Intellectual Problem Solving: Slow processing;Requires verbal cues General Comments: Pt thinks she had her pelvis repaired yesterday. Follows commands well. Not the best historian. Not sure of cognitive baseline.      General Comments General comments (skin integrity, edema, etc.): HR ranged from 80-126 bpm with activity. Sitting BP pre activity 98/61, post activity 109/75, reports mild dizziness with mobility.    Exercises General Exercises - Lower Extremity Ankle Circles/Pumps: AROM;Both;10 reps;Seated Quad Sets: AROM;Left;10 reps;Seated   Assessment/Plan    PT Assessment Patient needs continued PT services  PT Problem List Decreased strength;Decreased mobility;Decreased range of motion;Decreased safety awareness;Pain;Impaired sensation;Decreased balance;Decreased skin integrity;Cardiopulmonary status limiting activity;Decreased cognition;Decreased activity tolerance       PT Treatment Interventions Therapeutic activities;Gait training;Therapeutic exercise;Patient/family education;Balance training;Functional mobility training;Cognitive remediation    PT Goals (Current goals can be found in the Care Plan section)  Acute Rehab PT Goals Patient Stated Goal: to get home PT Goal Formulation: With patient Time For Goal Achievement: 04/08/20 Potential to Achieve Goals: Good    Frequency Min 3X/week   Barriers to discharge        Co-evaluation               AM-PAC PT "6 Clicks" Mobility   Outcome Measure Help needed turning from your back to your side while in a flat bed without using bedrails?: A Little Help needed moving from lying on your back to sitting on the side of a flat bed without using bedrails?: A Little Help needed moving to and from a bed to a chair (including a wheelchair)?: A Little Help needed standing up from a chair using your arms (e.g., wheelchair or bedside chair)?: A Lot Help needed to walk in hospital room?: A Little Help needed climbing 3-5 steps with a railing? : A Lot 6 Click Score: 16    End of Session   Activity Tolerance: Patient limited by fatigue;Patient limited by pain Patient left: in chair;with call bell/phone within reach Nurse Communication: Mobility status PT Visit Diagnosis: Pain;Difficulty in walking, not elsewhere classified (R26.2) Pain - Right/Left: Left Pain - part of body: Leg    Time: 0786-7544 PT Time Calculation (min) (ACUTE ONLY): 21 min   Charges:   PT Evaluation $PT Eval Moderate Complexity: 1 Mod          Marisa Severin, PT, DPT Acute Rehabilitation Services Pager [phone] Office [phone]      Marguarite Arbour A [facility] 03/25/2020, 9:04 AM

## 2020-03-25 NOTE — Progress Notes (Addendum)
Progress Note    03/25/2020 7:32 AM 1 Day Post-Op  Subjective:  RN noted HRs in the 100-170s last evening. Treated with some Lopressor with NSR. Patient without symptoms. No recurrence. Patient feels good this morning. States pain is well controlled. She has been able to get up and sit in chair. Tolerating diet   Vitals:   03/25/20 0400 03/25/20 0500  BP: 95/66 99/72  Pulse: 91 92  Resp: 14 20  Temp:  98.5 F (36.9 C)  SpO2: 100% 100%   Physical Exam: Cardiac:  regular Lungs: non labored Incisions: left lower extremity incisions clean, dry and intact without swelling or hematoma Extremities:  2+ left femoral pulse, left leg warm. Doppler PT/ Pero/ Dp signals in left foot. Dry gangrene of left toes Abdomen:  Soft, flat, non tender Neurologic: alert and oriented  CBC    Component Value Date/Time   WBC 8.6 03/25/2020 0216   RBC 3.29 (L) 03/25/2020 0216   HGB 9.5 (L) 03/25/2020 0216   HGB 14.6 03/26/2019 1450   HCT 29.1 (L) 03/25/2020 0216   HCT 44.8 03/26/2019 1450   PLT 248 03/25/2020 0216   PLT 340 03/26/2019 1450   MCV 88.4 03/25/2020 0216   MCV 92 03/26/2019 1450   MCH 28.9 03/25/2020 0216   MCHC 32.6 03/25/2020 0216   RDW 15.8 (H) 03/25/2020 0216   RDW 14.7 03/26/2019 1450   LYMPHSABS 1.3 03/26/2019 1450   MONOABS 0.4 03/18/2018 0213   EOSABS 0.1 03/26/2019 1450   BASOSABS 0.0 03/26/2019 1450    BMET    Component Value Date/Time   NA 134 (L) 03/25/2020 0216   NA 132 (L) 03/26/2019 1450   K 4.1 03/25/2020 0216   CL 100 03/25/2020 0216   CO2 21 (L) 03/25/2020 0216   GLUCOSE 128 (H) 03/25/2020 0216   BUN 14 03/25/2020 0216   BUN 10 03/26/2019 1450   CREATININE 0.82 03/25/2020 0216   CALCIUM 8.0 (L) 03/25/2020 0216   GFRNONAA >60 03/25/2020 0216   GFRAA >60 03/25/2020 0216    INR    Component Value Date/Time   INR 1.1 03/18/2020 1445     Intake/Output Summary (Last 24 hours) at 03/25/2020 0732 Last data filed at 03/25/2020 0400 Gross per 24  hour  Intake 3992.3 ml  Output 590 ml  Net 3402.3 ml     Assessment/Plan:  64 y.o. female is s/p  1.  Left common femoral endarterectomy with profundoplasty including endarterectomy of the distal left external iliac artery with bovine pericardial patch angioplasty 2.  Left iliac arteriogram 3.  Retrograde left external iliac artery angioplasty with stent placement (7 mm x 29 mm VBX and 7 mm x 39 mm VBX) 4.  Harvest of left great saphenous vein 5.  Left common femoral to below-knee popliteal artery bypass with ipsilateral non-reversed great saphenous vein tunneled subfascial and subsartorial  1 Day Post-Op. Left leg is well perfused. Incisions are clean, dry and intact without swelling or hematoma. Doppler PP, DP and Pero signals. Pain is well controlled. Hemodynamically stable. Tolerating diet. States that she will need some assistance when she goes home with [facility] RN. OOB and Mobilize today  DVT prophylaxis:  Sq heparin   Karoline Caldwell, PA-C [facility] [phone] 03/25/2020 7:32 AM   I have seen and evaluated the patient. I agree with the PA note as documented above. POD#1 s/p left common femoral endarterectomy, retrograde left external iliac artery stent, left common femoral to BK pop bypass with  vein.  Very brisk left peroneal signal (has single vessel runoff).  Incisions look good.  Hgb 9.5 this am.  OOB to chair and ambulate with therapy.  Aspirin and plavix for left iliac stent.  Statin.  Hemodynamics ok this am.  Got PRN last night for tachycardia.    Marty Heck, MD [facility] [place] Office: [phone]

## 2020-03-25 NOTE — Evaluation (Signed)
Occupational Therapy Evaluation Patient Details Name: Veronica Stewart MRN: [id number] DOB: 07-07-1956 Today's Date: 03/25/2020    History of Present Illness Patient is a 65 y/o female who presents s/p Left common femoral endarterectomy and Left common femoral to below-knee popliteal artery bypass 03/24/20. PMH includes HTN, alcohol use, asthma, femur fx s/p IM nailing.   Clinical Impression   This 64 y/o female presents with the above. PTA pt reports being mod independent with ADL and functional mobility using RW. Pt requiring some encouragement to participate in therapy session today. Overall pt requiring minA for mobility tasks using RW, up to [place] for LB ADL. Pt with tendency to limit wt bearing through LLE with mobility today due to pain/discomfort. Max HR noted 115 bpm. Pt to benefit from continued acute OT services and currently recommend follow up [facility] services after discharge to maximize her overall safety and independence with ADL and mobility. Will follow.     Follow Up Recommendations  Home health OT;Supervision/Assistance - 24 hour    Equipment Recommendations  None recommended by OT           Precautions / Restrictions Precautions Precautions: Fall Precaution Comments: watch HR Restrictions Weight Bearing Restrictions: No      Mobility Bed Mobility Overal bed mobility: Needs Assistance Bed Mobility: Supine to Sit;Sit to Supine     Supine to sit: Min guard Sit to supine: Min guard   General bed mobility comments: increased time and effort with HOB slightly elevated, no physical assist required   Transfers Overall transfer level: Needs assistance Equipment used: Rolling walker (2 wheeled) Transfers: Sit to/from Stand Sit to Stand: Mod assist         General transfer comment: increased boosting assist from EOB, minA from [facility] over toilet - VCs and assist for safety when transitioning to sitting EOB as pt attempting to sit prematurely    Balance Overall  balance assessment: Needs assistance;History of Falls Sitting-balance support: Feet supported;No upper extremity supported Sitting balance-Leahy Scale: Fair     Standing balance support: Bilateral upper extremity supported;During functional activity Standing balance-Leahy Scale: Poor Standing balance comment: Requires UE support ins tanding.                           ADL either performed or assessed with clinical judgement   ADL Overall ADL's : Needs assistance/impaired Eating/Feeding: Modified independent;Sitting   Grooming: Wash/dry hands;Set up;Sitting   Upper Body Bathing: Min guard;Sitting   Lower Body Bathing: Moderate assistance;Sit to/from stand   Upper Body Dressing : Min guard;Sitting   Lower Body Dressing: Moderate assistance;Sit to/from stand   Toilet Transfer: Minimal assistance;Ambulation;RW;BSC Toilet Transfer Details (indicate cue type and reason): BSC over toilet  Toileting- Clothing Manipulation and Hygiene: Minimal assistance;Sit to/from stand;Sitting/lateral lean Toileting - Clothing Manipulation Details (indicate cue type and reason): assist for gown management while pt performing pericare      Functional mobility during ADLs: Minimal assistance;Rolling walker General ADL Comments: pt with limtiations due to LLE pain, tends to maintain NWB/limited wt bearing in LLE with mobility, performing mobility tasks to/from bathroom during session                          Pertinent Vitals/Pain Pain Assessment: Faces Faces Pain Scale: Hurts even more Pain Location: LLE Pain Descriptors / Indicators: Operative site guarding Pain Intervention(s): Monitored during session;Repositioned     Hand Dominance Right   Extremity/Trunk Assessment Upper  Extremity Assessment Upper Extremity Assessment: Overall WFL for tasks assessed   Lower Extremity Assessment Lower Extremity Assessment: Defer to PT evaluation       Communication  Communication Communication: No difficulties   Cognition Arousal/Alertness: Awake/alert Behavior During Therapy: Flat affect Overall Cognitive Status: Impaired/Different from baseline Area of Impairment: Safety/judgement;Awareness;Problem solving                         Safety/Judgement: Decreased awareness of safety;Decreased awareness of deficits Awareness: Intellectual Problem Solving: Slow processing;Requires verbal cues General Comments: pt initially not responding to therapist upon arrival and requiring encouragement to engage/to do so. not very forthcoming with information    General Comments  max HR noted 115 with activity     Exercises     Shoulder Instructions      Home Living Family/patient expects to be discharged to:: Private residence Living Arrangements: Other relatives (Aunt) Available Help at Discharge: Family;Available PRN/intermittently Type of Home: House Home Access: Level entry     Home Layout: One level     Bathroom Shower/Tub: Tub/shower unit;Curtain   Biochemist, clinical: Standard     Home Equipment: Environmental consultant - 2 wheels;Shower seat;Bedside commode          Prior Functioning/Environment Level of Independence: Independent with assistive device(s)        Comments: Uses RW for ambulation, does own ADLs. Aunt does cooking. Keeps room clean. Does not drive.  Hx of falls.        OT Problem List: Decreased range of motion;Decreased strength;Decreased activity tolerance;Impaired balance (sitting and/or standing);Decreased cognition;Decreased safety awareness;Decreased knowledge of use of DME or AE;Pain;Cardiopulmonary status limiting activity      OT Treatment/Interventions: Self-care/ADL training;Therapeutic exercise;Energy conservation;DME and/or AE instruction;Therapeutic activities;Patient/family education;Balance training    OT Goals(Current goals can be found in the care plan section) Acute Rehab OT Goals Patient Stated Goal: to get  home OT Goal Formulation: With patient Time For Goal Achievement: 04/08/20 Potential to Achieve Goals: Good  OT Frequency: Min 2X/week   Barriers to D/C:            Co-evaluation              AM-PAC OT "6 Clicks" Daily Activity     Outcome Measure Help from another person eating meals?: None Help from another person taking care of personal grooming?: A Little Help from another person toileting, which includes using toliet, bedpan, or urinal?: A Little Help from another person bathing (including washing, rinsing, drying)?: A Lot Help from another person to put on and taking off regular upper body clothing?: A Little Help from another person to put on and taking off regular lower body clothing?: A Lot 6 Click Score: 17   End of Session Equipment Utilized During Treatment: Gait belt;Rolling walker Nurse Communication: Mobility status  Activity Tolerance: Patient tolerated treatment well Patient left: in bed;with call bell/phone within reach;with bed alarm set  OT Visit Diagnosis: Other abnormalities of gait and mobility (R26.89);Pain Pain - Right/Left: Left Pain - part of body: Leg                Time: 1420-1433 OT Time Calculation (min): 13 min Charges:  OT General Charges $OT Visit: 1 Visit OT Evaluation $OT Eval Moderate Complexity: [place], OT Acute Rehabilitation Services Pager [phone] Office [phone]    Raymondo Band 03/25/2020, 4:03 PM

## 2020-03-25 NOTE — Progress Notes (Signed)
Mobility Specialist - Progress Note   03/25/20 1455  Mobility  Activity Refused mobility    Pt refused mobility as she just finished w/ OT and was feeling tired.   Pricilla Handler Mobility Specialist Mobility Specialist Phone: [phone]

## 2020-03-25 NOTE — Progress Notes (Addendum)
PHARMACIST LIPID MONITORING   Veronica Stewart is a 64 y.o. female admitted on 03/24/2020 with LLE ischemia.  Pharmacy has been consulted to optimize lipid-lowering therapy with the indication of secondary prevention for clinical ASCVD.  Recent Labs:  Lipid Panel (last 6 months):   Lab Results  Component Value Date   CHOL 89 03/25/2020   TRIG 31 03/25/2020   HDL 44 03/25/2020   CHOLHDL 2.0 03/25/2020   VLDL 6 03/25/2020   LDLCALC 39 03/25/2020    Hepatic function panel (last 6 months):   Lab Results  Component Value Date   AST 14 (L) 03/25/2020   ALT 10 03/25/2020   ALKPHOS 35 (L) 03/25/2020   BILITOT 0.8 03/25/2020    SCr (since admission):   Serum creatinine: 0.82 mg/dL 03/25/20 0216 Estimated creatinine clearance: 51.5 mL/min  Current lipid-lowering therapy: None   Assessment: Patient's LDL is 39. Does not meet criteria for lipid protocol.    Plan: -No changes to statin therapy.  -Pharmacy to sign off    Albertina Parr, PharmD., BCPS, BCCCP Clinical Pharmacist Clinical phone for 03/25/20 until 3:30pm: [phone] If after 3:30pm, please refer to [facility] for unit-specific pharmacist

## 2020-03-26 NOTE — Progress Notes (Signed)
Mobility Specialist - Progress Note   03/26/20 1457  Mobility  Activity Refused mobility    Pt refused mobility, stating she has been ambulating a lot today and that she would like to rest.   Pricilla Handler Mobility Specialist Mobility Specialist Phone: [phone]

## 2020-03-26 NOTE — Progress Notes (Addendum)
Progress Note    03/26/2020 7:08 AM 2 Days Post-Op  Subjective:  Notes reviewed.  Expected incisional pain. Denies CP or SOB. Assisted patient to toilet with RW.  HR = 108  Tachycardic again last night at approx. 3am for 15 mintues. No symptoms and HR returned to 100s- 110s spontaneously. Vitals:   03/26/20 0350 03/26/20 0455  BP: 114/77 118/86  Pulse:  95  Resp: 12 17  Temp:  99 F (37.2 C)  SpO2:  100%    Physical Exam: Cardiac:  RRR Lungs:  nonlabored Incisions:  Well approximated. No drainage or hematoma Extremities:  LLE: dry gangrene of toes. Brisk peroneal signal. + AT and DP signals.  + right DP signal Abdomen:  ND  CBC    Component Value Date/Time   WBC 8.6 03/25/2020 0216   RBC 3.29 (L) 03/25/2020 0216   HGB 9.5 (L) 03/25/2020 0216   HGB 14.6 03/26/2019 1450   HCT 29.1 (L) 03/25/2020 0216   HCT 44.8 03/26/2019 1450   PLT 248 03/25/2020 0216   PLT 340 03/26/2019 1450   MCV 88.4 03/25/2020 0216   MCV 92 03/26/2019 1450   MCH 28.9 03/25/2020 0216   MCHC 32.6 03/25/2020 0216   RDW 15.8 (H) 03/25/2020 0216   RDW 14.7 03/26/2019 1450   LYMPHSABS 1.3 03/26/2019 1450   MONOABS 0.4 03/18/2018 0213   EOSABS 0.1 03/26/2019 1450   BASOSABS 0.0 03/26/2019 1450    BMET    Component Value Date/Time   NA 134 (L) 03/25/2020 0216   NA 132 (L) 03/26/2019 1450   K 4.1 03/25/2020 0216   CL 100 03/25/2020 0216   CO2 21 (L) 03/25/2020 0216   GLUCOSE 128 (H) 03/25/2020 0216   BUN 14 03/25/2020 0216   BUN 10 03/26/2019 1450   CREATININE 0.82 03/25/2020 0216   CALCIUM 8.0 (L) 03/25/2020 0216   GFRNONAA >60 03/25/2020 0216   GFRAA >60 03/25/2020 0216     Intake/Output Summary (Last 24 hours) at 03/26/2020 0708 Last data filed at 03/26/2020 1517 Gross per 24 hour  Intake 600 ml  Output --  Net 600 ml    HOSPITAL MEDICATIONS Scheduled Meds: . aspirin EC  81 mg Oral Q0600  . atorvastatin  20 mg Oral Daily  . clopidogrel  75 mg Oral Q0600  . docusate sodium   100 mg Oral Daily  . fluticasone furoate-vilanterol  1 puff Inhalation Daily  . heparin  5,000 Units Subcutaneous Q8H  . mouth rinse  15 mL Mouth Rinse BID  . pantoprazole  40 mg Oral Daily  . Ensure Max Protein  11 oz Oral BID   Continuous Infusions: . sodium chloride    . sodium chloride Stopped (03/25/20 0533)  . magnesium sulfate bolus IVPB     PRN Meds:.sodium chloride, acetaminophen **OR** acetaminophen, albuterol, alum & mag hydroxide-simeth, guaiFENesin-dextromethorphan, hydrALAZINE, labetalol, magnesium sulfate bolus IVPB, metoprolol tartrate, morphine injection, ondansetron, oxyCODONE-acetaminophen, phenol, potassium chloride, senna-docusate  Assessment:  POD #2 s/p left common femoral endarterectomy, retrograde left external iliac artery stent, left common femoral to BK pop bypass with vein. Good Doppler signals. Pain controlled.  Two episodes of sinus tachy.  BP stable. Not symptomatic.  Plan: -Continue Plavix and asa. Home with HHPT.  Will confer with Dr. Carlis Abbott re: disposition today. -DVT prophylaxis:  heparin Osmond   Risa Grill, PA-C [facility] [phone] 03/26/2020  7:08 AM   I have seen and evaluated the patient. I agree with the PA note as  documented above. POD#2 s/p left common femoral endarterectomy, retrograde left external iliac artery stent, left common femoral to BK pop bypass with vein.  Very brisk left peroneal signal (has single vessel runoff).  Incisions continue to look good.  Feels she needs one more day.  Continue aspirin and plavix.  Work with therapy. OOB and walk with walker.  PT recommending home health PT.    Marty Heck, MD [facility] [place] Office: [phone]

## 2020-03-26 NOTE — Progress Notes (Signed)
Physical Therapy Treatment Patient Details Name: Veronica Stewart MRN: [id number] DOB: 12/29/1955 Today's Date: 03/26/2020    History of Present Illness Patient is a 64 y/o female who presents s/p Left common femoral endarterectomy and Left common femoral to below-knee popliteal artery bypass 03/24/20. PMH includes HTN, alcohol use, asthma, femur fx s/p IM nailing.    PT Comments    Patient self limiting in terms of ambulation distance, encouraged twice today to walk in hallway, but pt continued to state she is up and around in the room a lot.  Still without BM per pt so encouraged mobility to stimulate her bowels.  Feel she should progress to be able to return home with follow up HHPT.    Follow Up Recommendations  Home health PT;Supervision for mobility/OOB     Equipment Recommendations  None recommended by PT    Recommendations for Other Services       Precautions / Restrictions Precautions Precautions: Fall Precaution Comments: watch HR    Mobility  Bed Mobility Overal bed mobility: Needs Assistance Bed Mobility: Supine to Sit;Sit to Supine     Supine to sit: Supervision Sit to supine: Min assist   General bed mobility comments: assist for L LE into bed  Transfers Overall transfer level: Needs assistance Equipment used: Rolling walker (2 wheeled) Transfers: Sit to/from Stand Sit to Stand: Min assist         General transfer comment: up from EOB and from 3:1 over toilet min to minguard A for balance pt with heavy UE use  Ambulation/Gait Ambulation/Gait assistance: Min guard Gait Distance (Feet): 40 Feet Assistive device: Rolling walker (2 wheeled) Gait Pattern/deviations: Step-through pattern;Step-to pattern;Decreased stance time - left;Trunk flexed     General Gait Details: limited stance time on L, pt with limited tolerance, but did take two laps in room with encouragement   Stairs             Wheelchair Mobility    Modified Rankin (Stroke  Patients Only)       Balance Overall balance assessment: Needs assistance;History of Falls   Sitting balance-Leahy Scale: Good     Standing balance support: Bilateral upper extremity supported;During functional activity Standing balance-Leahy Scale: Poor Standing balance comment: Requires UE support in standing.                            Cognition Arousal/Alertness: Awake/alert Behavior During Therapy: Flat affect Overall Cognitive Status: Impaired/Different from baseline Area of Impairment: Safety/judgement;Awareness;Problem solving                         Safety/Judgement: Decreased awareness of safety   Problem Solving: Slow processing;Requires verbal cues        Exercises      General Comments        Pertinent Vitals/Pain Pain Score: 7  Pain Location: LLE Pain Descriptors / Indicators: Operative site guarding Pain Intervention(s): Monitored during session;Repositioned;Limited activity within patient's tolerance    Home Living                      Prior Function            PT Goals (current goals can now be found in the care plan section) Progress towards PT goals: Progressing toward goals    Frequency    Min 3X/week      PT Plan Current plan remains appropriate    Co-evaluation  AM-PAC PT "6 Clicks" Mobility   Outcome Measure  Help needed turning from your back to your side while in a flat bed without using bedrails?: None Help needed moving from lying on your back to sitting on the side of a flat bed without using bedrails?: None Help needed moving to and from a bed to a chair (including a wheelchair)?: A Little Help needed standing up from a chair using your arms (e.g., wheelchair or bedside chair)?: A Little Help needed to walk in hospital room?: A Little Help needed climbing 3-5 steps with a railing? : A Little 6 Click Score: 20    End of Session   Activity Tolerance: Patient limited by  pain Patient left: in bed;with call bell/phone within reach   PT Visit Diagnosis: Pain;Difficulty in walking, not elsewhere classified (R26.2) Pain - Right/Left: Left Pain - part of body: Leg     Time: 1515-1530 PT Time Calculation (min) (ACUTE ONLY): 15 min  Charges:  $Gait Training: 8-22 mins                     Magda Kiel, PT Acute Rehabilitation Services Pager:[phone] Office:[phone] 03/26/2020    [name] 03/26/2020, 5:03 PM

## 2020-03-26 NOTE — Progress Notes (Signed)
Pt up to bathroom to try to have BM. HR up to 160s when back to bed. BP 114/87. HR slowly decreased to 130s, where it sustained for about 10 minutes. Pt asymptomatic otherwise. HR down to 90 with no intervention.

## 2020-03-26 NOTE — Progress Notes (Signed)
@  0252 while attempting to have a BM in bathroom, pt HR jumped from 80s-100s NSR/ST to 130s-170s ST/SVT. HR sustained for approximately 15 minutes and pt remained asymptomatic. PRN IV Lopressor obtained as on previous night, but before drawing medication up pt's HR returned to 100s-110s ST. Will convey to Day RN and continue to monitor.

## 2020-03-27 MED ORDER — OXYCODONE-ACETAMINOPHEN 5-325 MG PO TABS: 1.0000 | ORAL_TABLET | ORAL | 0 refills | Status: DC | PRN

## 2020-03-27 MED ORDER — CLOPIDOGREL BISULFATE 75 MG PO TABS: 75.0000 mg | ORAL_TABLET | Freq: Every day | ORAL | 3 refills | Status: DC

## 2020-03-27 MED ORDER — ATORVASTATIN CALCIUM 20 MG PO TABS: 20.0000 mg | ORAL_TABLET | Freq: Every day | ORAL | 11 refills | Status: DC

## 2020-03-27 MED ORDER — ASPIRIN 81 MG PO TBEC: 81.0000 mg | DELAYED_RELEASE_TABLET | Freq: Every day | ORAL | 11 refills | Status: DC

## 2020-03-27 NOTE — Progress Notes (Signed)
[facility] [place]  Subjective  - no complaints.  Walked with therapy yesterday.   Objective 114/77 97 98.4 F (36.9 C) (Oral) 16 99%  Intake/Output Summary (Last 24 hours) at 03/27/2020 0813 Last data filed at 03/27/2020 0636 Gross per 24 hour  Intake 120 ml  Output --  Net 120 ml    Left groin incision c/d/i Left leg incisions c/d/i Brisk left peroneal signal  Laboratory Lab Results: Recent Labs    03/25/20 0216  WBC 8.6  HGB 9.5*  HCT 29.1*  PLT 248   BMET Recent Labs    03/25/20 0216  NA 134*  K 4.1  CL 100  CO2 21*  GLUCOSE 128*  BUN 14  CREATININE 0.82  CALCIUM 8.0*    COAG Lab Results  Component Value Date   INR 1.1 03/18/2020   INR 1.07 03/18/2018   No results found for: PTT  Assessment/Planning:  POD#3 s/p left common femoral endarterectomy, retrograde left external iliac artery stent, left common femoral to BK pop bypass with vein. Very brisk left peroneal signal (has single vessel runoff). Incisions continue to look good. Plan d/c home today with home health PT.  Looks good overall.  Aspirin and plavix at discharge.  F/U 3-4 weeks for wound check in the office.  Marty Heck 03/27/2020 8:13 AM --

## 2020-03-27 NOTE — Discharge Summary (Signed)
Bypass Discharge Summary Patient ID: Veronica Stewart [id number] 64 y.o. 12/07/1955  Admit date: 03/24/2020  Discharge date and time: 03/28/2020  Admitting Physician: Veronica Heck, MD   Discharge Physician: Veronica Martinez, MD  Admission Diagnoses: PAD (peripheral artery disease) ([place]) [I73.9] Peripheral arterial occlusive disease ([place]) [I77.9]  Discharge Diagnoses: PAD  Admission Condition: fair  Discharged Condition: good  Indication for Admission: critical left lower extremity limb ischemia with tissue loss in left foot  HPI: 64 year old female with history of hypertension and tobacco abuse thatpresented for evaluation of critical limb ischemia with tissue loss in the left lower extremity. She describes severe rest pain in the foot since at least March and now has a ulceration on her left fifth toe after toenail fell off. Unable to obtain any ABIs in clinic today. She does remain motor and sensory intact. Dr. Carlis Stewart recommended left lower extremity arteriogram with possible intervention tomorrow in the Cath Lab. We discussed that she may ultimately require bypass if there are no endovascular options. She subsequently underwent aortogram with bilateral lower extremity arteriogram with runoff on 03/05/20.Aortogram showed patent renal arteries bilaterally although the right renal artery has about a 50% stenosis in mid segment of the artery.  Infrarenal aorta is widely patent with no flow-limiting stenosis.  On the left she has a patent common iliac artery and external iliac artery occlusion with reconstitution of a very diseased and calcified common femoral and then flush SFA occlusion with only profunda runoff.  She does reconstitute a very diseased above-knee popliteal artery with a healthier looking below-knee popliteal artery and only single-vessel peroneal runoff with a collateral at the ankle to the posterior tibial artery.  On the right she has a patent common and  external iliac as well as a patent common femoral.  There are multiple high-grade stenosis in the proximal SFA with the remainder of the SFA popliteal artery patent and apparent single-vessel runoff via the anterior tibial artery.  Flow was much more sluggish on the right and difficult to evaluate complete runoff into the foot.  It was discussed with patient that if unable to cross her iliac occlusion would potentially need a femorofemoral bypass with left femoropopliteal bypass. Her surgery was to be scheduled one week following her angiogram but she elected to have it delayed until early August.  She was admitted to the hospital on 03/24/20 and underwent  1.  Left common femoral endarterectomy with profundoplasty including endarterectomy of the distal left external iliac artery with bovine pericardial patch angioplasty 2.  Left iliac arteriogram 3.  Retrograde left external iliac artery angioplasty with stent placement (7 mm x 29 mm VBX and 7 mm x 39 mm VBX) 4.  Harvest of left great saphenous vein 5.  Left common femoral to below-knee popliteal artery bypass with ipsilateral non-reversed great saphenous vein tunneled subfascial and subsartorial  She tolerated the procedure well and was transferred to the floor in stable condition.  POD#1 she had some tachycardia overnight without any symptoms. This was treated with Metoprolol and she returned to NSR. Pain remained well controlled. Her left leg incisions remained clean, dry and intact. Brisk doppler pero signals. Patient requested [facility] services to assist with her incision care  POD#2-3 Patients lower extremity bypass graft with patency. Doppler Pero signal. Incisions intact and dry. Pain continued to be well controlled. PT/ OT recommending [place] PT/OT services however no home care agency would accept her. She did not feel comfortable leaving to go home  POD# 4 Patients  incisions intact. Brisk left peroneal doppler signal. Patient doing well and  comfortable now with discharging home. Option for SNF discussed with her but she wants to discharge home. Outpatient options for therapy discussed with patient. She will discharge home on Aspirin and Plavix. She will follow up with Dr. Carlis Stewart in 3-4 weeks    Consults: None  Treatments: surgery:  1.  Left common femoral endarterectomy with profundoplasty including endarterectomy of the distal left external iliac artery with bovine pericardial patch angioplasty 2.  Left iliac arteriogram 3.  Retrograde left external iliac artery angioplasty with stent placement (7 mm x 29 mm VBX and 7 mm x 39 mm VBX) 4.  Harvest of left great saphenous vein 5.  Left common femoral to below-knee popliteal artery bypass with ipsilateral non-reversed great saphenous vein tunneled subfascial and subsartorial    Disposition: Discharge disposition: 01-Home or Self Care      - For [facility] Registry use ---  Post-op:  Wound infection: No  Graft infection: No  Transfusion: No  If yes, o units given New Arrhythmia: No Patency judged by: [ ]  Dopper only, [ ]  Palpable graft pulse, Valu.Nieves ] Palpable distal pulse, [ ]  ABI inc. > 0.15, [ ]  Duplex D/C Ambulatory Status: Ambulatory with Assistance  Complications: MI: Valu.Nieves ] No, [ ]  Troponin only, [ ]  EKG or Clinical CHF: No Resp failure: Valu.Nieves ] none, [ ]  Pneumonia, [ ]  Ventilator Chg in renal function: Valu.Nieves ] none, [ ]  Inc. Cr > 0.5, [ ]  Temp. Dialysis, [ ]  Permanent dialysis Stroke: Valu.Nieves ] None, [ ]  Minor, [ ]  Major Return to OR: No  Reason for return to OR: [ ]  Bleeding, [ ]  Infection, [ ]  Thrombosis, [ ]  Revision  Discharge medications: Statin use:  Yes ASA use:  Yes Plavix use:  Yes Beta blocker use: No Coumadin use: No  for medical reason not indicated    Patient Instructions:  Allergies as of 03/27/2020   No Known Allergies     Medication List    TAKE these medications   acetaminophen 500 MG tablet Commonly known as: TYLENOL Take 500 mg by mouth every 6  (six) hours as needed for mild pain.   albuterol 108 (90 Base) MCG/ACT inhaler Commonly known as: VENTOLIN HFA INHALE 2 PUFFS BY MOUTH EVERY 6 HOURS AS NEEDED FOR WHEEZING AND SHORTNESS OF BREATH What changed:   how much to take  how to take this  when to take this  reasons to take this  additional instructions   aspirin 81 MG EC tablet Take 1 tablet (81 mg total) by mouth daily at 6 (six) AM. Swallow whole. Start taking on: March 28, 2020   atorvastatin 20 MG tablet Commonly known as: LIPITOR Take 1 tablet (20 mg total) by mouth daily.   ciprofloxacin 500 MG tablet Commonly known as: CIPRO Take 1 tablet (500 mg total) by mouth 2 (two) times daily.   clopidogrel 75 MG tablet Commonly known as: PLAVIX Take 1 tablet (75 mg total) by mouth daily at 6 (six) AM. Start taking on: March 28, 2020   Ensure Active High Protein Liqd Take 237 oz by mouth 2 (two) times daily.   Fluticasone-Salmeterol 100-50 MCG/DOSE Aepb Commonly known as: ADVAIR Inhale 1 puff into the lungs 2 (two) times daily. What changed:   when to take this  reasons to take this   mupirocin ointment 2 % Commonly known as: BACTROBAN Apply 1 application topically 2 (two) times daily.  oxyCODONE-acetaminophen 5-325 MG tablet Commonly known as: Percocet Take 1 tablet by mouth every 4 (four) hours as needed for severe pain.   Prolia 60 MG/ML Sosy injection Generic drug: denosumab Inject 60 mg into the skin every 6 (six) months.   Vitamin D (Ergocalciferol) 1.25 MG (50000 UNIT) Caps capsule Commonly known as: DRISDOL Take 50,000 Units by mouth 2 (two) times a week. Monday and Thursday      Activity: activity as tolerated Diet: heart healthy Wound Care: keep wound clean and dry  Follow-up with Dr. Monica Stewart in 3-4 week.   Signed: Karoline Caldwell 03/27/2020 7:51 AM

## 2020-03-27 NOTE — Progress Notes (Signed)
Occupational Therapy Treatment Patient Details Name: Veronica Stewart MRN: [id number] DOB: September 08, 1955 Today's Date: 03/27/2020    History of present illness Patient is a 64 y/o female who presents s/p Left common femoral endarterectomy and Left common femoral to below-knee popliteal artery bypass 03/24/20. PMH includes HTN, alcohol use, asthma, femur fx s/p IM nailing.   OT comments  Patient in agreement with OT treatment session with focus on self-care re-education, safety with use of RW, functional transfers and household mobility. Patient making progress toward goals demonstrating ability to complete UB/LB dressing with set-up to Min guard in sitting/standing with use of RW. Patient requiring cues for walker safety with short distance ambulation in room. Vitals monitored throughout with HR of 100-120 at rest and 130's with BADL tasks. Patient would benefit from continued acute OT services in prep for safe d/c home with recommendation for 24hr assist and HHOT.    Follow Up Recommendations  Home health OT;Supervision/Assistance - 24 hour    Equipment Recommendations  None recommended by OT    Recommendations for Other Services      Precautions / Restrictions Precautions Precautions: Fall Precaution Comments: watch HR Restrictions Weight Bearing Restrictions: No       Mobility Bed Mobility Overal bed mobility: Needs Assistance Bed Mobility: Supine to Sit;Sit to Supine     Supine to sit: Modified independent (Device/Increase time)        Transfers Overall transfer level: Needs assistance Equipment used: Rolling walker (2 wheeled) Transfers: Sit to/from Stand Sit to Stand: Min guard         General transfer comment: Min gaurd to supervision for STS from EOB x3 trials     Balance     Sitting balance-Leahy Scale: Good       Standing balance-Leahy Scale: Fair                             ADL either performed or assessed with clinical judgement   ADL  Overall ADL's : Needs assistance/impaired                 Upper Body Dressing : Set up;Sitting Upper Body Dressing Details (indicate cue type and reason): Assist to retrieve clothing from closset but able to don shirt without help Lower Body Dressing: Set up;Sitting/lateral leans;Sit to/from stand                       Vision       Perception     Praxis      Cognition Arousal/Alertness: Awake/alert Behavior During Therapy: Flat affect                                            Exercises     Shoulder Instructions       General Comments HR 120's at rest elevating to 130's with dressing tasks seated EOB. Max HR 138.      Pertinent Vitals/ Pain       Pain Assessment: 0-10 Pain Score: 8  Pain Location: LLE Pain Descriptors / Indicators: Operative site guarding Pain Intervention(s): Limited activity within patient's tolerance;Monitored during session;Repositioned  Home Living  Prior Functioning/Environment              Frequency  Min 2X/week        Progress Toward Goals  OT Goals(current goals can now be found in the care plan section)  Progress towards OT goals: Progressing toward goals  Acute Rehab OT Goals Patient Stated Goal: to get home OT Goal Formulation: With patient Time For Goal Achievement: 04/08/20 Potential to Achieve Goals: Good ADL Goals Pt Will Perform Grooming: with supervision;standing;sitting Pt Will Perform Lower Body Bathing: with supervision;sitting/lateral leans;sit to/from stand Pt Will Perform Lower Body Dressing: with supervision;sit to/from stand;sitting/lateral leans Pt Will Transfer to Toilet: with supervision;ambulating Pt Will Perform Toileting - Clothing Manipulation and hygiene: with supervision;sit to/from stand;sitting/lateral leans  Plan Discharge plan remains appropriate    Co-evaluation                 AM-PAC OT "6  Clicks" Daily Activity     Outcome Measure   Help from another person eating meals?: None Help from another person taking care of personal grooming?: A Little Help from another person toileting, which includes using toliet, bedpan, or urinal?: A Little Help from another person bathing (including washing, rinsing, drying)?: A Little Help from another person to put on and taking off regular upper body clothing?: A Little Help from another person to put on and taking off regular lower body clothing?: A Little 6 Click Score: 19    End of Session Equipment Utilized During Treatment: Rolling walker  OT Visit Diagnosis: Other abnormalities of gait and mobility (R26.89);Pain Pain - Right/Left: Left Pain - part of body: Leg   Activity Tolerance Patient tolerated treatment well   Patient Left in chair;with call bell/phone within reach   Nurse Communication          Time: 3736-6815 OT Time Calculation (min): 23 min  Charges: OT General Charges $OT Visit: 1 Visit OT Treatments $Self Care/Home Management : 23-37 mins  Mickaela Starlin H. OTR/L Supplemental OT, Department of rehab services [phone]   Nehemiah Mcfarren R H. 03/27/2020, 9:27 AM

## 2020-03-27 NOTE — Plan of Care (Signed)
Continue to monitor

## 2020-03-27 NOTE — Progress Notes (Signed)
In to discharge patient, patient states that she is having pain and just does not "feel like going home this evening". Patient states she wants to go tomorrow. Dr. Donzetta Matters notified and agrees for CM to see patient again in AM and reassess.

## 2020-03-27 NOTE — TOC Transition Note (Signed)
Transition of Care (TOC) - CM/SW Discharge Note Marvetta Gibbons RN, BSN Transitions of Care Unit 4E- RN Case Manager See Treatment Team for direct phone #    Patient Details  Name: Veronica Stewart MRN: [id number] Date of Birth: 06/08/1956  Transition [facility]) CM/SW Contact:  Dawayne Patricia, RN Phone Number: 03/27/2020, 2:54 PM   Clinical Narrative:    Pt stable for transition home today, s/p fempop. Orders written for HHRN/PT/OT- spoke with pt at bedside to discuss [facility] needs- list provided for [facility] choice Per CMS guidelines from medicare.gov website with star ratings (copy placed in shadow chart)- based on Medicaid provider website for in-network providers- [facility] [place] [place]. Per pt she does not have a preference in provider.  Pt reports she has an aide with [employer]. Pt states she has transportation home, address, phone # and PCP all confirmed.  Spoke with Tiffany at [facility] regarding pt- they had a pre-op referral from Vascular office on pt- however are unable to accept the Medicaid referral at this time.  Calls made to [facility]- unable to accept at this time [facility]- unable to accept [facility]- unable to accept Returned to pt's room to let her know that [facility] was unable to be secured, and to offer outpt option for therapy needs. Pt is unsure if she would like to do outpt at this time. Discussed locations and did select [facility] outpt [address] location as closest to pt's home. Pt stated that she would think about it and agreed to have info placed on d/c paperwork so that she can f/u should she decide that she would like to do outpt therapy.   Pt reports that she has RW and BSC at home already- no other DME needs noted.    Final next level of care: Home/Self Care Barriers to Discharge: No [facility] will accept this patient   Patient Goals and CMS Choice Patient states their goals for this hospitalization and ongoing recovery are:: to get better CMS  Medicare.gov Compare Post Acute Care list provided to:: Patient Choice offered to / list presented to : Patient  Discharge Placement                 Home      Discharge Plan and Services   Discharge Planning Services: CM Consult Post Acute Care Choice: Home Health          DME Arranged: N/A DME Agency: NA       HH Arranged: RN, PT, OT [place] Agency:  (no agency will accept)        Social Determinants of Health (SDOH) Interventions     Readmission Risk Interventions Readmission Risk Prevention Plan 03/27/2020  Transportation Screening Complete  PCP or Specialist Appt within 5-7 Days Complete  Home Care Screening Complete  Medication Review (RN CM) Complete  Some recent data might be hidden

## 2020-03-28 NOTE — Progress Notes (Addendum)
Progress Note    03/28/2020 8:05 AM 4 Days Post-Op  Subjective: no complaints. Feels more comfortable to go home today. She does not want to go to SNF and states that she has family at home who can help her   Vitals:   03/27/20 2342 03/28/20 0419  BP: 124/82 124/83  Pulse: 81 88  Resp: 16 17  Temp: 98.5 F (36.9 C) 99.1 F (37.3 C)  SpO2: 100% 98%   Physical Exam: Cardiac: regular Lungs: non labored Incisions:  Left lower extremity incisions are clean, dry and intact Extremities: left lower extremity is well perfused and warm. Mild swelling of left thigh. Brisk left Peroneal doppler signal. Left leg and foot warm Neurologic: alert and oriented  CBC    Component Value Date/Time   WBC 8.6 03/25/2020 0216   RBC 3.29 (L) 03/25/2020 0216   HGB 9.5 (L) 03/25/2020 0216   HGB 14.6 03/26/2019 1450   HCT 29.1 (L) 03/25/2020 0216   HCT 44.8 03/26/2019 1450   PLT 248 03/25/2020 0216   PLT 340 03/26/2019 1450   MCV 88.4 03/25/2020 0216   MCV 92 03/26/2019 1450   MCH 28.9 03/25/2020 0216   MCHC 32.6 03/25/2020 0216   RDW 15.8 (H) 03/25/2020 0216   RDW 14.7 03/26/2019 1450   LYMPHSABS 1.3 03/26/2019 1450   MONOABS 0.4 03/18/2018 0213   EOSABS 0.1 03/26/2019 1450   BASOSABS 0.0 03/26/2019 1450    BMET    Component Value Date/Time   NA 134 (L) 03/25/2020 0216   NA 132 (L) 03/26/2019 1450   K 4.1 03/25/2020 0216   CL 100 03/25/2020 0216   CO2 21 (L) 03/25/2020 0216   GLUCOSE 128 (H) 03/25/2020 0216   BUN 14 03/25/2020 0216   BUN 10 03/26/2019 1450   CREATININE 0.82 03/25/2020 0216   CALCIUM 8.0 (L) 03/25/2020 0216   GFRNONAA >60 03/25/2020 0216   GFRAA >60 03/25/2020 0216    INR    Component Value Date/Time   INR 1.1 03/18/2020 1445     Intake/Output Summary (Last 24 hours) at 03/28/2020 0805 Last data filed at 03/27/2020 1900 Gross per 24 hour  Intake 240 ml  Output --  Net 240 ml     Assessment/Plan:  64 y.o. female is s/p left common femoral  endarterectomy, retrograde left external iliac artery stent, left common femoral to BK pop bypass with vein. Very brisk left peroneal signal. Incisionscontinue tolook  good.Aspirin and plavix at discharge. No HH agencies are available for patient. She is still considering outpatient services but at this time is ready to go home.Plan d/c home today.  F/U 3-4 weeks for wound check in the office.    Karoline Caldwell, PA-C [facility] [phone] 03/28/2020 8:05 AM   I have seen and evaluated the patient. I agree with the PA note as documented above. POD#4s/p left common femoral endarterectomy, retrograde left external iliac artery stent, left common femoral to BK pop bypass with vein. Very brisk left peroneal signal (has single vessel runoff). Incisionscontinue tolook good.Discharge was delayed yesterday and could not arrange any home health.  Discussed options with the patient this morning of going home without home health versus SNF placement.  She was denied by all home health agencies.  She is adamantly against a SNF and states she has support at home and her niece is a Music therapist.  She feels much better today and is ready for discharge.  Again recommended aspirin Plavix for iliac stents  and follow-up in 3 to 4 weeks.  Marty Heck, MD [facility] [place] Office: [phone]

## 2020-03-28 NOTE — Plan of Care (Signed)
  Problem: Education: Goal: Knowledge of General Education information will improve Description Including pain rating scale, medication(s)/side effects and non-pharmacologic comfort measures Outcome: Progressing   Problem: Health Behavior/Discharge Planning: Goal: Ability to manage health-related needs will improve Outcome: Progressing   

## 2020-03-28 NOTE — Plan of Care (Signed)
Continue to monitor

## 2020-03-31 ENCOUNTER — Telehealth: Payer: Self-pay

## 2020-03-31 NOTE — Telephone Encounter (Signed)
Transition Care Management Follow-up Telephone Call  Date of discharge and from where: 03/28/2020, [facility]  How have you been since you were released from the hospital?  She said she is doing "pretty good."  Any questions or concerns?  she would like a referral for [facility].    Items Reviewed:  Did the pt receive and understand the discharge instructions provided? she said she has the instructions and did not have any questions   Medications obtained and verified? she said she has all medications and did not have any questions about her med regime  Any new allergies since your discharge?  none reported   Do you have support at home?  yes, her uncle is with her and she said that she has other family that is coming to help  No home health. The hospital was unable to secure a home health provider.  Patient is considering outpatient therapy.  Has RW, cane, BSC  Functional Questionnaire: (I = Independent and D = Dependent) ADLs:  Independent, using RW, family is providing needed assistance    Follow up appointments reviewed:   PCP Hospital f/u appt confirmed?04/04/2020 @ 1050, [facility]u [facility]t confirmed? podiatry -04/10/2020; [facility] - 04/22/2020  Are transportation arrangements needed? she said that she uses [facility] to her appts.  This CM informed her that her medicaid plan will also pay for transportation to her medical appts  If their condition worsens, is the pt aware to call PCP or go to the Emergency Dept.?  yes  Was the patient provided with contact information for the PCP's office or ED?  provided her with the phone number for [facility]  Was to pt encouraged to call back with questions or concerns?  yes

## 2020-04-02 ENCOUNTER — Telehealth: Payer: Self-pay | Admitting: *Deleted

## 2020-04-02 NOTE — Telephone Encounter (Signed)
Spoke with patient regarding leg swelling s/p left common femoral endarterectomy, fem-pop and vein harvest on 03/24/20. Her foot is warm and not painful. The swelling was not sudden onset. Advised that swelling is normal post procedure and to elevate her legs. If the swelling gets worse despite elevation she is to call back.

## 2020-04-04 ENCOUNTER — Other Ambulatory Visit: Payer: Self-pay

## 2020-04-04 ENCOUNTER — Telehealth (INDEPENDENT_AMBULATORY_CARE_PROVIDER_SITE_OTHER): Payer: Medicaid Other | Admitting: Primary Care

## 2020-04-04 DIAGNOSIS — Z09 Encounter for follow-up examination after completed treatment for conditions other than malignant neoplasm: Secondary | ICD-10-CM

## 2020-04-04 NOTE — Progress Notes (Signed)
Legs and feet are swollen(left)

## 2020-04-04 NOTE — Progress Notes (Signed)
I  connected with [name] on 04/04/20 at 10:50 AM EDT by telephone and verified that I am speaking with the correct person using two identifier Patient is at home I discussed the limitations, risks, security and privacy concerns of performing an evaluation and management service by telephone and the availability of in person appointments. I also discussed with the patient that there may be a patient responsible charge related to this service. The patient expressed understanding and agreed to proceed.  Juluis Mire Np-C at office Telephone Note  HPI [name] is a 64 y.o.female presents for follow up from the hospital. Admit date to the hospital was 03/24/20, patient was discharged from the hospital on 03/28/20, patient was admitted for: CRITICAL LOWER LIMB ISCHEMIA-8/5/2021gangrenous changes to her left foot on the distal aspect of the toes. Left leg swelling and foot with decrease pain  Past Medical History:  Diagnosis Date  . Asthma   . Hypertension      No Known Allergies    Current Outpatient Medications on File Prior to Visit  Medication Sig Dispense Refill  . acetaminophen (TYLENOL) 500 MG tablet Take 500 mg by mouth every 6 (six) hours as needed for mild pain.    Marland Kitchen albuterol (VENTOLIN HFA) 108 (90 Base) MCG/ACT inhaler INHALE 2 PUFFS BY MOUTH EVERY 6 HOURS AS NEEDED FOR WHEEZING AND SHORTNESS OF BREATH (Patient taking differently: Inhale 2 puffs into the lungs every 6 (six) hours as needed for wheezing or shortness of breath. ) 8.5 g 2  . aspirin EC 81 MG EC tablet Take 1 tablet (81 mg total) by mouth daily at 6 (six) AM. Swallow whole. 30 tablet 11  . atorvastatin (LIPITOR) 20 MG tablet Take 1 tablet (20 mg total) by mouth daily. 30 tablet 11  . clopidogrel (PLAVIX) 75 MG tablet Take 1 tablet (75 mg total) by mouth daily at 6 (six) AM. 30 tablet 3  . Fluticasone-Salmeterol (ADVAIR) 100-50 MCG/DOSE AEPB Inhale 1 puff into the lungs 2 (two) times daily. (Patient  taking differently: Inhale 1 puff into the lungs daily as needed (shortness of breath). ) 1 each 3  . mupirocin ointment (BACTROBAN) 2 % Apply 1 application topically 2 (two) times daily. 30 g 2  . Nutritional Supplements (ENSURE ACTIVE HIGH PROTEIN) LIQD Take 237 oz by mouth 2 (two) times daily. 2964 mL 10  . oxyCODONE-acetaminophen (PERCOCET) 5-325 MG tablet Take 1 tablet by mouth every 4 (four) hours as needed for severe pain. 30 tablet 0  . PROLIA 60 MG/ML SOSY injection Inject 60 mg into the skin every 6 (six) months.     . Vitamin D, Ergocalciferol, (DRISDOL) 1.25 MG (50000 UNIT) CAPS capsule Take 50,000 Units by mouth 2 (two) times a week. Monday and Thursday    . ciprofloxacin (CIPRO) 500 MG tablet Take 1 tablet (500 mg total) by mouth 2 (two) times daily. (Patient not taking: Reported on 04/04/2020) 14 tablet 0   Current Facility-Administered Medications on File Prior to Visit  Medication Dose Route Frequency Provider Last Rate Last Admin  . albuterol (PROVENTIL) (2.5 MG/3ML) 0.083% nebulizer solution 2.5 mg  2.5 mg Nebulization Once Kerin Perna, NP        ROS: all negative except above.  : Assessment and Plan: Zooey was seen today for hospitalization follow-up.  Diagnoses and all orders for this visit:  Hospital discharge follow-up discharge home. Outpatient options for therapy discussed with patient. discharge home on Aspirin and Plavix. She will follow up with  Dr. Carlis Abbott in 3-4 weeks    Follow Up Instructions:    I discussed the assessment and treatment plan with the patient. The patient was provided an opportunity to ask questions and all were answered. The patient agreed with the plan and demonstrated an understanding of the instructions.   The patient was advised to call back or seek an in-person evaluation if the symptoms worsen or if the condition fails to improve as anticipated.  I provided 12 inutes of non-face-to-face time during this encounter. This includes  reviewing previous encounters, surgeries, labs and imaging    Kerin Perna, NP

## 2020-04-10 ENCOUNTER — Ambulatory Visit: Payer: Medicaid Other | Admitting: Podiatry

## 2020-04-22 ENCOUNTER — Encounter: Payer: Self-pay | Admitting: Vascular Surgery

## 2020-04-22 ENCOUNTER — Other Ambulatory Visit: Payer: Self-pay

## 2020-04-22 ENCOUNTER — Ambulatory Visit (INDEPENDENT_AMBULATORY_CARE_PROVIDER_SITE_OTHER): Payer: Self-pay | Admitting: Vascular Surgery

## 2020-04-22 VITALS — BP 128/93 | HR 102 | Temp 97.3°F | Resp 14

## 2020-04-22 DIAGNOSIS — I739 Peripheral vascular disease, unspecified: Secondary | ICD-10-CM

## 2020-04-22 MED ORDER — OXYCODONE-ACETAMINOPHEN 5-325 MG PO TABS: 1.0000 | ORAL_TABLET | ORAL | 0 refills | Status: DC | PRN

## 2020-04-22 MED ORDER — CLOPIDOGREL BISULFATE 75 MG PO TABS: 75.0000 mg | ORAL_TABLET | Freq: Every day | ORAL | 3 refills | Status: DC

## 2020-04-22 NOTE — Progress Notes (Signed)
Patient name: Veronica Stewart MRN: [id number] DOB: [birth year] Sex: female  REASON FOR VISIT: Postop check  HPI: Veronica Stewart is a 64 y.o. female the presents for postop check after left common femoral endarterectomy with retrograde left external iliac stent for chronic total occlusion and a left common femoral to below-knee popliteal artery bypass with vein on 03/24/2020 for critical limb ischemia with tissue loss.  On follow-up today she is doing quite well and states a lot of the pain in her foot is resolved.  She does complain of swelling and she has been treating this by elevating her leg.    Past Medical History:  Diagnosis Date  . Asthma   . Hypertension     Past Surgical History:  Procedure Laterality Date  . ABDOMINAL AORTOGRAM W/LOWER EXTREMITY Bilateral 03/05/2020   Procedure: ABDOMINAL AORTOGRAM W/LOWER EXTREMITY;  Surgeon: Marty Heck, MD;  Location: [facility];  Service: Cardiovascular;  Laterality: Bilateral;  . ENDARTERECTOMY FEMORAL Left 03/24/2020   Procedure: LEFT COMMON FEMORAL ENDARTERECTOMY;  Surgeon: Marty Heck, MD;  Location: [facility];  Service: Vascular;  Laterality: Left;  . ENDOVEIN HARVEST OF GREATER SAPHENOUS VEIN Left 03/24/2020   Procedure: HARVEST OF GREATER SAPHENOUS VEIN;  Surgeon: Marty Heck, MD;  Location: [facility];  Service: Vascular;  Laterality: Left;  . FEMORAL-POPLITEAL BYPASS GRAFT Left 03/24/2020   Procedure: BYPASS GRAFT FEMORAL-POPLITEAL ARTERY;  Surgeon: Marty Heck, MD;  Location: [facility];  Service: Vascular;  Laterality: Left;  . INSERTION OF ILIAC STENT Left 03/24/2020   Procedure: INSERTION OF RETROGRADE ILIAC STENT;  Surgeon: Marty Heck, MD;  Location: [facility];  Service: Vascular;  Laterality: Left;  . INTRAMEDULLARY (IM) NAIL INTERTROCHANTERIC Left 03/18/2018   Procedure: INTRAMEDULLARY (IM) NAIL INTERTROCHANTRIC;  Surgeon: Shona Needles, MD;  Location: [facility];  Service: Orthopedics;  Laterality: Left;   . INTRAOPERATIVE ARTERIOGRAM Left 03/24/2020   Procedure: INTRA OPERATIVE ARTERIOGRAM;  Surgeon: Marty Heck, MD;  Location: [facility];  Service: Vascular;  Laterality: Left;    Family History  Problem Relation Age of Onset  . Sudden Cardiac Death Neg Hx     SOCIAL HISTORY: Social History   Tobacco Use  . Smoking status: Current Every Day Smoker    Packs/day: 0.50    Types: Cigarettes  . Smokeless tobacco: Never Used  Substance Use Topics  . Alcohol use: Yes    Comment: two 40 oz beers most days     No Known Allergies  Current Outpatient Medications  Medication Sig Dispense Refill  . acetaminophen (TYLENOL) 500 MG tablet Take 500 mg by mouth every 6 (six) hours as needed for mild pain.    Marland Kitchen albuterol (VENTOLIN HFA) 108 (90 Base) MCG/ACT inhaler INHALE 2 PUFFS BY MOUTH EVERY 6 HOURS AS NEEDED FOR WHEEZING AND SHORTNESS OF BREATH (Patient taking differently: Inhale 2 puffs into the lungs every 6 (six) hours as needed for wheezing or shortness of breath. ) 8.5 g 2  . aspirin EC 81 MG EC tablet Take 1 tablet (81 mg total) by mouth daily at 6 (six) AM. Swallow whole. 30 tablet 11  . atorvastatin (LIPITOR) 20 MG tablet Take 1 tablet (20 mg total) by mouth daily. 30 tablet 11  . clopidogrel (PLAVIX) 75 MG tablet Take 1 tablet (75 mg total) by mouth daily at 6 (six) AM. 30 tablet 3  . Fluticasone-Salmeterol (ADVAIR) 100-50 MCG/DOSE AEPB Inhale 1 puff into the lungs 2 (two) times daily. (Patient taking differently:  Inhale 1 puff into the lungs daily as needed (shortness of breath). ) 1 each 3  . mupirocin ointment (BACTROBAN) 2 % Apply 1 application topically 2 (two) times daily. 30 g 2  . Nutritional Supplements (ENSURE ACTIVE HIGH PROTEIN) LIQD Take 237 oz by mouth 2 (two) times daily. 2964 mL 10  . PROLIA 60 MG/ML SOSY injection Inject 60 mg into the skin every 6 (six) months.     . Vitamin D, Ergocalciferol, (DRISDOL) 1.25 MG (50000 UNIT) CAPS capsule Take 50,000 Units by mouth 2  (two) times a week. Monday and Thursday    . ciprofloxacin (CIPRO) 500 MG tablet Take 1 tablet (500 mg total) by mouth 2 (two) times daily. (Patient not taking: Reported on 04/04/2020) 14 tablet 0  . oxyCODONE-acetaminophen (PERCOCET) 5-325 MG tablet Take 1 tablet by mouth every 4 (four) hours as needed for severe pain. (Patient not taking: Reported on 04/22/2020) 30 tablet 0   Current Facility-Administered Medications  Medication Dose Route Frequency Provider Last Rate Last Admin  . albuterol (PROVENTIL) (2.5 MG/3ML) 0.083% nebulizer solution 2.5 mg  2.5 mg Nebulization Once Kerin Perna, NP        REVIEW OF SYSTEMS:  [X]  denotes positive finding, [ ]  denotes negative finding Cardiac  Comments:  Chest pain or chest pressure:    Shortness of breath upon exertion:    Short of breath when lying flat:    Irregular heart rhythm:        Vascular    Pain in calf, thigh, or hip brought on by ambulation:    Pain in feet at night that wakes you up from your sleep:     Blood clot in your veins:    Leg swelling:         Pulmonary    Oxygen at home:    Productive cough:     Wheezing:         Neurologic    Sudden weakness in arms or legs:     Sudden numbness in arms or legs:     Sudden onset of difficulty speaking or slurred speech:    Temporary loss of vision in one eye:     Problems with dizziness:         Gastrointestinal    Blood in stool:     Vomited blood:         Genitourinary    Burning when urinating:     Blood in urine:        Psychiatric    Major depression:         Hematologic    Bleeding problems:    Problems with blood clotting too easily:        Skin    Rashes or ulcers:        Constitutional    Fever or chills:      PHYSICAL EXAM: Vitals:   04/22/20 1546  BP: (!) 128/93  Pulse: (!) 102  Resp: 14  Temp: (!) 97.3 F (36.3 C)  TempSrc: Temporal  SpO2: 97%    GENERAL: The patient is a well-nourished female, in no acute distress. The vital signs  are documented above. CARDIAC: There is a regular rate and rhythm.  VASCULAR:  Palpable left femoral pulse Brisk left DP/PT signals Left groin and leg incisions healing well 2+ edema left leg  DATA:   None  Assessment/Plan:  64 year old female presents for postop check status post left common femoral endarterectomy with retrograde left external iliac stent  for chronic total occlusion and left common femoral to below-knee pop bypass with ipsilateral great saphenous vein on 03/24/2020.  All of her incisions are healing without issue.  She has palpable left femoral pulse and very brisk dorsalis pedis and posterior tibial signals at the left ankle.  Discussed that the swelling would improve with time and continue elevating the leg.  She has a little bit of dry gangrene on the tip of the left great toe but I think this will heal with time given significant improvement in inflow.  She is being followed by podiatry.  Instructed that she continue aspirin and Plavix for her left iliac stent.  I did refill her pain medicine for 1 more prescription.  I will have her follow-up in 6 months with ABIs and left leg arterial duplex for ongoing surveillance.   Marty Heck, MD [facility] Office: [phone]

## 2020-04-29 ENCOUNTER — Other Ambulatory Visit: Payer: Self-pay | Admitting: *Deleted

## 2020-04-29 DIAGNOSIS — I739 Peripheral vascular disease, unspecified: Secondary | ICD-10-CM

## 2020-08-23 ENCOUNTER — Other Ambulatory Visit: Payer: Self-pay | Admitting: Vascular Surgery

## 2020-10-28 ENCOUNTER — Other Ambulatory Visit: Payer: Self-pay

## 2020-10-28 ENCOUNTER — Ambulatory Visit (INDEPENDENT_AMBULATORY_CARE_PROVIDER_SITE_OTHER): Payer: Medicaid Other | Admitting: Podiatry

## 2020-10-28 ENCOUNTER — Emergency Department (HOSPITAL_COMMUNITY)
Admission: EM | Admit: 2020-10-28 | Discharge: 2020-10-28 | Disposition: A | Discharge status: Home / Self Care | Payer: Medicaid Other | Attending: Emergency Medicine | Admitting: Emergency Medicine

## 2020-10-28 ENCOUNTER — Encounter (HOSPITAL_COMMUNITY): Payer: Self-pay

## 2020-10-28 DIAGNOSIS — F17210 Nicotine dependence, cigarettes, uncomplicated: Secondary | ICD-10-CM | POA: Insufficient documentation

## 2020-10-28 DIAGNOSIS — Z7901 Long term (current) use of anticoagulants: Secondary | ICD-10-CM

## 2020-10-28 DIAGNOSIS — M79675 Pain in left toe(s): Secondary | ICD-10-CM

## 2020-10-28 DIAGNOSIS — B351 Tinea unguium: Secondary | ICD-10-CM

## 2020-10-28 DIAGNOSIS — Z79899 Other long term (current) drug therapy: Secondary | ICD-10-CM | POA: Diagnosis not present

## 2020-10-28 DIAGNOSIS — Z7982 Long term (current) use of aspirin: Secondary | ICD-10-CM | POA: Insufficient documentation

## 2020-10-28 DIAGNOSIS — I739 Peripheral vascular disease, unspecified: Secondary | ICD-10-CM

## 2020-10-28 DIAGNOSIS — R040 Epistaxis: Secondary | ICD-10-CM | POA: Insufficient documentation

## 2020-10-28 DIAGNOSIS — J45909 Unspecified asthma, uncomplicated: Secondary | ICD-10-CM | POA: Diagnosis not present

## 2020-10-28 DIAGNOSIS — I10 Essential (primary) hypertension: Secondary | ICD-10-CM | POA: Diagnosis not present

## 2020-10-28 DIAGNOSIS — M79674 Pain in right toe(s): Secondary | ICD-10-CM

## 2020-10-28 LAB — CBC
HCT: 29.1 % — ABNORMAL LOW (ref 36.0–46.0)
Hemoglobin: 8.6 g/dL — ABNORMAL LOW (ref 12.0–15.0)
MCH: 22.2 pg — ABNORMAL LOW (ref 26.0–34.0)
MCHC: 29.6 g/dL — ABNORMAL LOW (ref 30.0–36.0)
MCV: 75.2 fL — ABNORMAL LOW (ref 80.0–100.0)
Platelets: 281 10*3/uL (ref 150–400)
RBC: 3.87 MIL/uL (ref 3.87–5.11)
RDW: 18.4 % — ABNORMAL HIGH (ref 11.5–15.5)
WBC: 8.2 10*3/uL (ref 4.0–10.5)
nRBC: 0 % (ref 0.0–0.2)

## 2020-10-28 LAB — BASIC METABOLIC PANEL
Anion gap: 8 (ref 5–15)
BUN: 15 mg/dL (ref 8–23)
CO2: 26 mmol/L (ref 22–32)
Calcium: 9.3 mg/dL (ref 8.9–10.3)
Chloride: 98 mmol/L (ref 98–111)
Creatinine, Ser: 0.83 mg/dL (ref 0.44–1.00)
GFR, Estimated: >60 mL/min (ref >60)
Glucose, Bld: 92 mg/dL (ref 70–99)
Potassium: 3.7 mmol/L (ref 3.5–5.1)
Sodium: 132 mmol/L — ABNORMAL LOW (ref 135–145)

## 2020-10-28 MED ORDER — MUPIROCIN 2 % EX OINT
1.0000 | TOPICAL_OINTMENT | Freq: Two times a day (BID) | CUTANEOUS | 2 refills | Status: DC
Start: 2020-10-28 — End: 2021-02-12

## 2020-10-28 MED ORDER — LIDOCAINE HCL URETHRAL/MUCOSAL 2 % EX GEL
1.0000 "application " | Freq: Once | CUTANEOUS | Status: AC
  Administered 2020-10-28: 1 via TOPICAL
  Filled 2020-10-28: qty 11

## 2020-10-28 MED ORDER — TRANEXAMIC ACID FOR EPISTAXIS
500.0000 mg | Freq: Once | TOPICAL | Status: AC
  Administered 2020-10-28: 500 mg via TOPICAL
  Filled 2020-10-28: qty 10

## 2020-10-28 MED ORDER — OXYMETAZOLINE HCL 0.05 % NA SOLN
1.0000 | Freq: Once | NASAL | Status: AC
  Administered 2020-10-28: 1 via NASAL
  Filled 2020-10-28: qty 30

## 2020-10-28 MED ORDER — AMOXICILLIN-POT CLAVULANATE 875-125 MG PO TABS
1.0000 | ORAL_TABLET | Freq: Two times a day (BID) | ORAL | 0 refills | Status: DC
Start: 2020-10-28 — End: 2021-02-17

## 2020-10-28 NOTE — Discharge Instructions (Signed)
Take antibiotics as prescribed. Take the entire course.  Keep packing in until is is removed by the ear, nose and throat doctor.  Call the ENT office listed below to set up an appointment to remove the packing.  Return to the ER with any new, worsening, or concerning symptoms.

## 2020-10-28 NOTE — ED Provider Notes (Signed)
[facility] EMERGENCY DEPARTMENT Provider Note   CSN: [id number] Arrival date & time: 10/28/20  0125     History Chief Complaint  Patient presents with  . Epistaxis    Veronica Stewart is a 65 y.o. female presenting for evaluation of nosebleed.  Patient states she was at home when she started to have bleeding from the right side of her nose.  It has been persistent for several hours.  She was not blowing her nose or irritating it in any way at the time it started bleeding.  She does have a history of previous nosebleeds, but usually will stop within an hour.  Patient is on Plavix, has been for several months.  She denies trauma or injury.  She denies headache, fever, chills.    Additional history obtained in chart review.  Patient with a history of PAD with recent arterial occlusive disease which is why patient is on Plavix and aspirin, hypertension, alcohol use, malnutrition  HPI     Past Medical History:  Diagnosis Date  . Asthma   . Hypertension     Patient Active Problem List   Diagnosis Date Noted  . PAD (peripheral artery disease) ([place]) 03/24/2020  . Peripheral arterial occlusive disease ([place]) 03/24/2020  . Critical lower limb ischemia ([place]) 03/04/2020  . Post-operative pain   . Acute blood loss anemia   . Displaced intertrochanteric fracture of left femur, initial encounter for closed fracture ([place]) 03/18/2018  . Hypertension 03/18/2018  . Alcohol dependence with acute alcoholic intoxication ([place]) 03/18/2018  . Hyponatremia 03/18/2018  . Asthma 03/18/2018  . Protein-calorie malnutrition, severe 03/18/2018    Past Surgical History:  Procedure Laterality Date  . ABDOMINAL AORTOGRAM W/LOWER EXTREMITY Bilateral 03/05/2020   Procedure: ABDOMINAL AORTOGRAM W/LOWER EXTREMITY;  Surgeon: Marty Heck, MD;  Location: [facility];  Service: Cardiovascular;  Laterality: Bilateral;  . ENDARTERECTOMY FEMORAL Left 03/24/2020   Procedure: LEFT  COMMON FEMORAL ENDARTERECTOMY;  Surgeon: Marty Heck, MD;  Location: [place];  Service: Vascular;  Laterality: Left;  . ENDOVEIN HARVEST OF GREATER SAPHENOUS VEIN Left 03/24/2020   Procedure: HARVEST OF GREATER SAPHENOUS VEIN;  Surgeon: Marty Heck, MD;  Location: [place];  Service: Vascular;  Laterality: Left;  . FEMORAL-POPLITEAL BYPASS GRAFT Left 03/24/2020   Procedure: BYPASS GRAFT FEMORAL-POPLITEAL ARTERY;  Surgeon: Marty Heck, MD;  Location: [facility];  Service: Vascular;  Laterality: Left;  . INSERTION OF ILIAC STENT Left 03/24/2020   Procedure: INSERTION OF RETROGRADE ILIAC STENT;  Surgeon: Marty Heck, MD;  Location: [facility];  Service: Vascular;  Laterality: Left;  . INTRAMEDULLARY (IM) NAIL INTERTROCHANTERIC Left 03/18/2018   Procedure: INTRAMEDULLARY (IM) NAIL INTERTROCHANTRIC;  Surgeon: Shona Needles, MD;  Location: [facility];  Service: Orthopedics;  Laterality: Left;  . INTRAOPERATIVE ARTERIOGRAM Left 03/24/2020   Procedure: INTRA OPERATIVE ARTERIOGRAM;  Surgeon: Marty Heck, MD;  Location: [facility];  Service: Vascular;  Laterality: Left;     OB History   No obstetric history on file.     Family History  Problem Relation Age of Onset  . Sudden Cardiac Death Neg Hx     Social History   Tobacco Use  . Smoking status: Current Every Day Smoker    Packs/day: 0.50    Types: Cigarettes  . Smokeless tobacco: Never Used  Vaping Use  . Vaping Use: Never used  Substance Use Topics  . Alcohol use: Yes    Comment: two 40 oz beers most days   .  Drug use: Not Currently    Types: Marijuana    Home Medications Prior to Admission medications   Medication Sig Start Date End Date Taking? Authorizing Provider  amoxicillin-clavulanate (AUGMENTIN) 875-125 MG tablet Take 1 tablet by mouth every 12 (twelve) hours. 10/28/20  Yes Breken Nazari, PA-C  acetaminophen (TYLENOL) 500 MG tablet Take 500 mg by mouth every 6 (six) hours as needed for mild pain.     [provider]  albuterol (VENTOLIN HFA) 108 (90 Base) MCG/ACT inhaler INHALE 2 PUFFS BY MOUTH EVERY 6 HOURS AS NEEDED FOR WHEEZING AND SHORTNESS OF BREATH Patient taking differently: Inhale 2 puffs into the lungs every 6 (six) hours as needed for wheezing or shortness of breath.  12/06/19   Kerin Perna, NP  aspirin EC 81 MG EC tablet Take 1 tablet (81 mg total) by mouth daily at 6 (six) AM. Swallow whole. 03/28/20   Baglia, Corrina, PA-C  atorvastatin (LIPITOR) 20 MG tablet Take 1 tablet (20 mg total) by mouth daily. 03/27/20   Baglia, Corrina, PA-C  ciprofloxacin (CIPRO) 500 MG tablet Take 1 tablet (500 mg total) by mouth 2 (two) times daily. Patient not taking: Reported on 04/04/2020 03/18/20   Marty Heck, MD  clopidogrel (PLAVIX) 75 MG tablet TAKE 1 TABLET(75 MG) BY MOUTH DAILY AT 6 AM 08/25/20   Angelia Mould, MD  Fluticasone-Salmeterol (ADVAIR) 100-50 MCG/DOSE AEPB Inhale 1 puff into the lungs 2 (two) times daily. Patient taking differently: Inhale 1 puff into the lungs daily as needed (shortness of breath).  12/06/19   Kerin Perna, NP  mupirocin ointment (BACTROBAN) 2 % Apply 1 application topically 2 (two) times daily. 02/13/20   Trula Slade, DPM  Nutritional Supplements (ENSURE ACTIVE HIGH PROTEIN) LIQD Take 237 oz by mouth 2 (two) times daily. 12/24/19   Kerin Perna, NP  oxyCODONE-acetaminophen (PERCOCET) 5-325 MG tablet Take 1 tablet by mouth every 4 (four) hours as needed for severe pain. 04/22/20 04/22/21  Marty Heck, MD  PROLIA 60 MG/ML SOSY injection Inject 60 mg into the skin every 6 (six) months.  02/05/20   [provider]  Vitamin D, Ergocalciferol, (DRISDOL) 1.25 MG (50000 UNIT) CAPS capsule Take 50,000 Units by mouth 2 (two) times a week. Monday and Thursday 12/03/19   [provider]    Allergies    Patient has no known allergies.  Review of Systems   Review of Systems  HENT: Positive for nosebleeds.    Hematological: Bruises/bleeds easily.  All other systems reviewed and are negative.   Physical Exam Updated Vital Signs BP (!) 160/128   Pulse 83   Temp 98.1 F (36.7 C) (Oral)   Resp 14   Ht 5\' 9"  (1.753 m)   Wt 46.7 kg   SpO2 99%   BMI 15.21 kg/m   Physical Exam Vitals and nursing note reviewed.  Constitutional:      General: She is not in acute distress.    Appearance: She is underweight.  HENT:     Head: Normocephalic and atraumatic.     Nose:     Comments: Blood noted in the right nare without active bleeding.  Mucosal edema noted.  No bleeding from the left nare.  No blood in the oropharynx. Cardiovascular:     Rate and Rhythm: Normal rate and regular rhythm.     Pulses: Normal pulses.  Pulmonary:     Effort: Pulmonary effort is normal.  Abdominal:     General: There  is no distension.  Musculoskeletal:        General: Normal range of motion.     Cervical back: Normal range of motion and neck supple.  Skin:    General: Skin is warm.     Findings: No rash.  Neurological:     Mental Status: She is alert and oriented to person, place, and time.     ED Results / Procedures / Treatments   Labs (all labs ordered are listed, but only abnormal results are displayed) Labs Reviewed  CBC - Abnormal; Notable for the following components:      Result Value   Hemoglobin 8.6 (*)    HCT 29.1 (*)    MCV 75.2 (*)    MCH 22.2 (*)    MCHC 29.6 (*)    RDW 18.4 (*)    All other components within normal limits  BASIC METABOLIC PANEL - Abnormal; Notable for the following components:   Sodium 132 (*)    All other components within normal limits    EKG EKG Interpretation  Date/Time:  Tuesday October 28 2020 01:28:15 EDT Ventricular Rate:  88 PR Interval:    QRS Duration: 65 QT Interval:  361 QTC Calculation: 437 R Axis:   81 Text Interpretation: Sinus rhythm Biatrial enlargement Anterior infarct, old Confirmed by Ripley Fraise [phone]) on 10/28/2020 1:30:45  AM   Radiology No results found.  Procedures .Epistaxis Management  Date/Time: 10/28/2020 4:53 AM Performed by: Franchot Heidelberg, PA-C Authorized by: Franchot Heidelberg, PA-C   Consent:    Consent obtained:  Verbal   Consent given by:  Patient   Risks discussed:  Bleeding, infection, nasal injury and pain   Alternatives discussed:  Alternative treatment Anesthesia:    Anesthesia method:  Topical application   Topical anesthetic:  Lidocaine gel Procedure details:    Treatment site:  R anterior   Treatment method:  Anterior pack   Treatment complexity:  Limited   Treatment episode: initial   Post-procedure details:    Assessment:  Bleeding stopped   Procedure completion:  Tolerated Comments:     Pt initally tx with aftin and direct pressure. Then tx with rapid rhino size 4.5 and lidocaine jelly.  Pt then tx with merocel tampon and lidocaine jelly after initial packing was dislodged.      Medications Ordered in ED Medications  tranexamic acid (CYKLOKAPRON) 1000 MG/10ML topical solution 500 mg (has no administration in time range)  oxymetazoline (AFRIN) 0.05 % nasal spray 1 spray (1 spray Each Nare Given 10/28/20 0253)  lidocaine (XYLOCAINE) 2 % jelly 1 application (1 application Topical Given 10/28/20 0504)    ED Course  I have reviewed the triage vital signs and the nursing notes.  Pertinent labs & imaging results that were available during my care of the patient were reviewed by me and considered in my medical decision making (see chart for details).    MDM Rules/Calculators/A&P                          Patient presenting for evaluation of nosebleed.  On exam, patient peers nontoxic.  This is been going on for several hours, however stopped upon arrival to the ER.  She does not have any active bleeding, will check labs to ensure no significant anemia.  Will continue to monitor to ensure no repeat bleeding.  Labs interpreted by me, overall reassuring.  Patient is  anemic with a hemoglobin of 8.6, however this is not  significantly different from her recent check of 9.5.  Will have patient follow-up with her PCP, but she does not need emergent transfusion at this time.  Patient had repeat bleeding, will use Afrin and reassess.  Pt with continued bleeding. As I cannot visualize the origin of bleeding, will need to pack for definitive tx.   Pt sneezed and rapid rhino was dislodged. Repeat packing placed with merocel tampon.   Pt sneezed packing out again (?if pulling it out). Will apply txa. May need ENT consultation if unable to stop bleeding.   Pt signed out to [facility], PA- C for f/u on continued bleeding.   Final Clinical Impression(s) / ED Diagnoses Final diagnoses:  Epistaxis    Rx / DC Orders ED Discharge Orders         Ordered    amoxicillin-clavulanate (AUGMENTIN) 875-125 MG tablet  Every 12 hours        10/28/20 0608           Franchot Heidelberg, PA-C 10/28/20 7573    Ripley Fraise, MD 10/29/20 [phone]

## 2020-10-28 NOTE — ED Notes (Signed)
Pt given a cup of ginger ale.

## 2020-10-28 NOTE — ED Provider Notes (Signed)
  Physical Exam  BP (!) 160/128   Pulse 83   Temp 98.1 F (36.7 C) (Oral)   Resp 14   Ht 5\' 9"  (1.753 m)   Wt 46.7 kg   SpO2 99%   BMI 15.21 kg/m   Physical Exam Vitals and nursing note reviewed.  Constitutional:      General: She is not in acute distress.    Appearance: She is underweight. She is not ill-appearing, toxic-appearing or diaphoretic.  HENT:     Head: Normocephalic and atraumatic. No raccoon eyes, abrasion, contusion, masses, right periorbital erythema, left periorbital erythema or laceration.     Jaw: No trismus or pain on movement.     Nose: Rhinorrhea present. No nasal deformity, signs of injury or nasal tenderness.     Comments: Minimal rhinorrhea from right nare  No bleeding noted    Mouth/Throat:     Pharynx: Uvula midline.     Comments: No blood noted in patient's oropharynx Cardiovascular:     Rate and Rhythm: Normal rate.  Pulmonary:     Effort: Pulmonary effort is normal. No respiratory distress.     Breath sounds: No stridor.  Skin:    General: Skin is warm and dry.  Neurological:     General: No focal deficit present.     Mental Status: She is alert.  Psychiatric:        Behavior: Behavior is cooperative.     ED Course/Procedures     Procedures  MDM   Care of patient assumed from PA Caccavale at [facility].  Agree with history, physical exam and plan.  See their note for further details. Briefly, patient presents with complaint of epistaxis from right nostril.  Bleeding started approximately 2100.  Patient does have a history of previous nosebleeds however will stop within an hour.  Patient is on Plavix and has been for several months.  Patient denies any trauma or injury.  Patient denies any headache, fever, chills.  Patient is anemic with a hemoglobin of 8.6 however baseline appears to be around 9.  Will have patient follow-up with her primary care provider but does not need emergent transfusion at this time.  Patient was given Afrin. Patient  was given [employer] however became dislodged. Patient was given Merocel tampon however became dislodged.  Patient had TXA ordered by previous provider prior to myself assuming care.  Will reevaluate patient after he receives this adjunct.  If bleeding is not resolved will reach out to ENT.  0737 assessment of patient her bleeding has been controlled.  We will continue to monitor with plan to discharge home.  0715 nosebleed continues to be well controlled.  Will discharge patient with follow-up to ENT.  Will have patient also follow-up with primary care provider as she was noted to be hypertensive in the emergency department.  Discussed results, findings, treatment and follow up. Patient advised of return precautions. Patient verbalized understanding and agreed with plan.     Loni Beckwith, PA-C 10/28/20 0719    Ripley Fraise, MD 10/29/20 [phone]

## 2020-10-28 NOTE — Patient Instructions (Signed)
Apply a small amount of mupirocin ointment on the left hallux nail bed

## 2020-10-28 NOTE — ED Triage Notes (Signed)
EMS reports pt is from home. Was watching television and nose began to bleed. Started around 9pm. Bleeding controlled at this time. Takes plavix.

## 2020-10-28 NOTE — ED Notes (Signed)
Went in to assess pt. Rhino rocket came out. Pt still having some mild bleeding.

## 2020-10-28 NOTE — ED Notes (Addendum)
Pt has a small amount of bleeding present to right nare.

## 2020-10-29 ENCOUNTER — Telehealth: Payer: Self-pay | Admitting: *Deleted

## 2020-10-29 NOTE — Telephone Encounter (Signed)
Transition Care Management Unsuccessful Follow-up Telephone Call  Date of discharge and from where:  10/28/2020 - [facility]  Attempts:  1st Attempt  Reason for unsuccessful TCM follow-up call:  Line rang busy x2

## 2020-10-30 NOTE — Telephone Encounter (Signed)
Transition Care Management Unsuccessful Follow-up Telephone Call  Date of discharge and from where:  10/28/2020 from [facility]  Attempts:  2nd Attempt  Reason for unsuccessful TCM follow-up call:  Left voice message

## 2020-10-31 ENCOUNTER — Other Ambulatory Visit: Payer: Self-pay | Admitting: *Deleted

## 2020-10-31 DIAGNOSIS — I739 Peripheral vascular disease, unspecified: Secondary | ICD-10-CM

## 2020-10-31 DIAGNOSIS — M79606 Pain in leg, unspecified: Secondary | ICD-10-CM

## 2020-10-31 NOTE — Telephone Encounter (Signed)
Transition Care Management Follow-up Telephone Call  Date of discharge and from where: 10/28/2020 - [facility]  How have you been since you were released from the hospital? "Much better"  Any questions or concerns? No  Items Reviewed:  Did the pt receive and understand the discharge instructions provided? Yes   Medications obtained and verified? Yes   Other? No   Any new allergies since your discharge? No   Dietary orders reviewed? No  Do you have support at home? Yes   Home Care and Equipment/Supplies: Were home health services ordered? not applicable If so, what is the name of the agency? N/A  Has the agency set up a time to come to the patient's home? not applicable Were any new equipment or medical supplies ordered?  No What is the name of the medical supply agency? N/A Were you able to get the supplies/equipment? not applicable Do you have any questions related to the use of the equipment or supplies? No  Functional Questionnaire: (I = Independent and D = Dependent) ADLs: I  Bathing/Dressing- I  Meal Prep- I  Eating- I  Maintaining continence- I  Transferring/Ambulation- I  Managing Meds- I  Follow up appointments reviewed:   PCP Hospital f/u appt confirmed? No  - advised pt that if she continued having nose bleeds she would need to see her PCP. She verbalized understanding.  [facility] f/u appt confirmed? No    Are transportation arrangements needed? No   If their condition worsens, is the pt aware to call PCP or go to the Emergency Dept.? Yes  Was the patient provided with contact information for the PCP's office or ED? Yes  Was to pt encouraged to call back with questions or concerns? Yes

## 2020-11-04 ENCOUNTER — Ambulatory Visit (INDEPENDENT_AMBULATORY_CARE_PROVIDER_SITE_OTHER): Payer: Medicaid Other | Admitting: Vascular Surgery

## 2020-11-04 ENCOUNTER — Encounter: Payer: Self-pay | Admitting: Vascular Surgery

## 2020-11-04 ENCOUNTER — Ambulatory Visit (HOSPITAL_COMMUNITY)
Admission: RE | Admit: 2020-11-04 | Discharge: 2020-11-04 | Disposition: A | Discharge status: Home / Self Care | Payer: Medicaid Other | Source: Ambulatory Visit | Attending: Vascular Surgery | Admitting: Vascular Surgery

## 2020-11-04 ENCOUNTER — Other Ambulatory Visit: Payer: Self-pay

## 2020-11-04 ENCOUNTER — Ambulatory Visit (INDEPENDENT_AMBULATORY_CARE_PROVIDER_SITE_OTHER)
Admission: RE | Admit: 2020-11-04 | Discharge: 2020-11-04 | Disposition: A | Discharge status: Home / Self Care | Payer: Medicaid Other | Source: Ambulatory Visit | Attending: Vascular Surgery | Admitting: Vascular Surgery

## 2020-11-04 VITALS — BP 144/102 | HR 85 | Temp 97.8°F | Resp 14 | Ht 69.0 in | Wt 106.0 lb

## 2020-11-04 DIAGNOSIS — M79606 Pain in leg, unspecified: Secondary | ICD-10-CM | POA: Insufficient documentation

## 2020-11-04 DIAGNOSIS — I70229 Atherosclerosis of native arteries of extremities with rest pain, unspecified extremity: Secondary | ICD-10-CM | POA: Diagnosis not present

## 2020-11-04 DIAGNOSIS — I739 Peripheral vascular disease, unspecified: Secondary | ICD-10-CM | POA: Insufficient documentation

## 2020-11-04 NOTE — Progress Notes (Signed)
Subjective: 65 y.o. returns the office today for painful, elongated, thickened toenails which she cannot trim herself. Denies any redness or drainage around the nails. Denies any acute changes since last appointment and no new complaints today. Denies any systemic complaints such as fevers, chills, nausea, vomiting.   Since I last saw her she underwent left common femoral endarterectomy with retrograde left external iliac stent for chronic total occlusion and left common femoral to below-knee popliteal artery bypass on March 24, 2020 with Dr. Carlis Abbott.  PCP: Kerin Perna, NP   Objective: AAO 3, NAD DP/PT pulses decreased Nails are significantly hypertrophic, dystrophic, elongated, brittle, discolored 10. There is tenderness overlying the nails 1-5 bilaterally. There is no surrounding erythema or drainage along the nail sites.  Upon debridement of the left hallux of the nail came off in total and underlying nail that appear to be healed. Dry skin present.  There is no open lesions identified.  No open lesions or pre-ulcerative lesions are identified. No pain with calf compression, swelling, warmth, erythema.  Assessment: Patient presents with symptomatic onychomycosis; left hallux onycholysis  Plan: -Treatment options including alternatives, risks, complications were discussed -Nails sharply debrided 10 without complication/bleeding. -Mupirocin ointment the left hallux toenail site.  The nail came off on its own in total with minimal debris to the toenail. -Discussed daily foot inspection. If there are any changes, to call the office immediately.  -Follow-up in 3 months or sooner if any problems are to arise. In the meantime, encouraged to call the office with any questions, concerns, changes symptoms.  Celesta Gentile, DPM

## 2020-11-04 NOTE — Progress Notes (Signed)
Patient name: Veronica Stewart MRN: [id number] DOB: [birth year] Sex: female  REASON FOR VISIT: 6 month follow-up for surveillance left leg PAD  HPI: Veronica Stewart is a 65 y.o. female the presents for 6 month follow-up for surveillance of left leg PAD.  She previously had a left common femoral endarterectomy with retrograde left external iliac stent for chronic total occlusion and a left common femoral to below-knee popliteal artery bypass with vein on 03/24/2020 for critical limb ischemia with tissue loss.    States her left leg is doing well.  She is having no problems with the right leg either.  She is still smoking.  She is taking her aspirin Plavix as previously directed.  She did have a toenail fall off left great toe but feels this is healing.  Past Medical History:  Diagnosis Date  . Asthma   . Hypertension     Past Surgical History:  Procedure Laterality Date  . ABDOMINAL AORTOGRAM W/LOWER EXTREMITY Bilateral 03/05/2020   Procedure: ABDOMINAL AORTOGRAM W/LOWER EXTREMITY;  Surgeon: Marty Heck, MD;  Location: [place] [facility];  Service: Cardiovascular;  Laterality: Bilateral;  . ENDARTERECTOMY FEMORAL Left 03/24/2020   Procedure: LEFT COMMON FEMORAL ENDARTERECTOMY;  Surgeon: Marty Heck, MD;  Location: [place];  Service: Vascular;  Laterality: Left;  . ENDOVEIN HARVEST OF GREATER SAPHENOUS VEIN Left 03/24/2020   Procedure: HARVEST OF GREATER SAPHENOUS VEIN;  Surgeon: Marty Heck, MD;  Location: [place];  Service: Vascular;  Laterality: Left;  . FEMORAL-POPLITEAL BYPASS GRAFT Left 03/24/2020   Procedure: BYPASS GRAFT FEMORAL-POPLITEAL ARTERY;  Surgeon: Marty Heck, MD;  Location: [place];  Service: Vascular;  Laterality: Left;  . INSERTION OF ILIAC STENT Left 03/24/2020   Procedure: INSERTION OF RETROGRADE ILIAC STENT;  Surgeon: Marty Heck, MD;  Location: [place];  Service: Vascular;  Laterality: Left;  . INTRAMEDULLARY (IM) NAIL INTERTROCHANTERIC Left  03/18/2018   Procedure: INTRAMEDULLARY (IM) NAIL INTERTROCHANTRIC;  Surgeon: Shona Needles, MD;  Location: [place];  Service: Orthopedics;  Laterality: Left;  . INTRAOPERATIVE ARTERIOGRAM Left 03/24/2020   Procedure: INTRA OPERATIVE ARTERIOGRAM;  Surgeon: Marty Heck, MD;  Location[facility];  Service: Vascular;  Laterality: Left;    Family History  Problem Relation Age of Onset  . Sudden Cardiac Death Neg Hx     SOCIAL HISTORY: Social History   Tobacco Use  . Smoking status: Current Every Day Smoker    Packs/day: 0.50    Types: Cigarettes  . Smokeless tobacco: Never Used  Substance Use Topics  . Alcohol use: Yes    Comment: two 40 oz beers most days     No Known Allergies  Current Outpatient Medications  Medication Sig Dispense Refill  . acetaminophen (TYLENOL) 500 MG tablet Take 500 mg by mouth every 6 (six) hours as needed for mild pain.    Marland Kitchen albuterol (VENTOLIN HFA) 108 (90 Base) MCG/ACT inhaler INHALE 2 PUFFS BY MOUTH EVERY 6 HOURS AS NEEDED FOR WHEEZING AND SHORTNESS OF BREATH (Patient taking differently: Inhale 2 puffs into the lungs every 6 (six) hours as needed for wheezing or shortness of breath.) 8.5 g 2  . amoxicillin-clavulanate (AUGMENTIN) 875-125 MG tablet Take 1 tablet by mouth every 12 (twelve) hours. 14 tablet 0  . aspirin EC 81 MG EC tablet Take 1 tablet (81 mg total) by mouth daily at 6 (six) AM. Swallow whole. 30 tablet 11  . atorvastatin (LIPITOR) 20 MG tablet Take 1 tablet (20 mg total) by  mouth daily. 30 tablet 11  . clopidogrel (PLAVIX) 75 MG tablet TAKE 1 TABLET(75 MG) BY MOUTH DAILY AT 6 AM 30 tablet 3  . Fluticasone-Salmeterol (ADVAIR) 100-50 MCG/DOSE AEPB Inhale 1 puff into the lungs 2 (two) times daily. (Patient taking differently: Inhale 1 puff into the lungs daily as needed (shortness of breath).) 1 each 3  . mupirocin ointment (BACTROBAN) 2 % Apply 1 application topically 2 (two) times daily. 30 g 2  . mupirocin ointment (BACTROBAN) 2 % Apply 1  application topically 2 (two) times daily. 30 g 2  . Nutritional Supplements (ENSURE ACTIVE HIGH PROTEIN) LIQD Take 237 oz by mouth 2 (two) times daily. 2964 mL 10  . PROLIA 60 MG/ML SOSY injection Inject 60 mg into the skin every 6 (six) months.     . Vitamin D, Ergocalciferol, (DRISDOL) 1.25 MG (50000 UNIT) CAPS capsule Take 50,000 Units by mouth 2 (two) times a week. Monday and Thursday    . ciprofloxacin (CIPRO) 500 MG tablet Take 1 tablet (500 mg total) by mouth 2 (two) times daily. (Patient not taking: No sig reported) 14 tablet 0  . oxyCODONE-acetaminophen (PERCOCET) 5-325 MG tablet Take 1 tablet by mouth every 4 (four) hours as needed for severe pain. (Patient not taking: Reported on 11/04/2020) 30 tablet 0   Current Facility-Administered Medications  Medication Dose Route Frequency Provider Last Rate Last Admin  . albuterol (PROVENTIL) (2.5 MG/3ML) 0.083% nebulizer solution 2.5 mg  2.5 mg Nebulization Once Kerin Perna, NP        REVIEW OF SYSTEMS:  [X]  denotes positive finding, [ ]  denotes negative finding Cardiac  Comments:  Chest pain or chest pressure:    Shortness of breath upon exertion:    Short of breath when lying flat:    Irregular heart rhythm:        Vascular    Pain in calf, thigh, or hip brought on by ambulation:    Pain in feet at night that wakes you up from your sleep:     Blood clot in your veins:    Leg swelling:         Pulmonary    Oxygen at home:    Productive cough:     Wheezing:         Neurologic    Sudden weakness in arms or legs:     Sudden numbness in arms or legs:     Sudden onset of difficulty speaking or slurred speech:    Temporary loss of vision in one eye:     Problems with dizziness:         Gastrointestinal    Blood in stool:     Vomited blood:         Genitourinary    Burning when urinating:     Blood in urine:        Psychiatric    Major depression:         Hematologic    Bleeding problems:    Problems with blood  clotting too easily:        Skin    Rashes or ulcers:        Constitutional    Fever or chills:      PHYSICAL EXAM: Vitals:   11/04/20 1131  BP: (!) 144/102  Pulse: 85  Resp: 14  Temp: 97.8 F (36.6 C)  TempSrc: Temporal  SpO2: 96%  Weight: 106 lb (48.1 kg)  Height: 5\' 9"  (1.753 m)    GENERAL:  The patient is a well-nourished female, in no acute distress. The vital signs are documented above. CARDIAC: There is a regular rate and rhythm.  VASCULAR:  Palpable left femoral pulse Brisk left DP/PT signals Left groin and leg incisions previously well healed Neuro: No deficits.   DATA:   ABIs today are 1.01 on the right biphasic and 1.10 on the left biphasic  Left femoral to below-knee pop bypass patent with no stenosis  Left external iliac artery stent patent with no stenosis  Assessment/Plan:  65 year-old female presents for 6 month follow-up status post left common femoral endarterectomy with retrograde left external iliac stent for chronic total occlusion and left common femoral to below-knee pop bypass with ipsilateral great saphenous vein on 03/24/2020.  Discussed with her today that her external iliac stent on the left as well as her left common femoral to below-knee popliteal bypass is widely patent with no recurrent stenosis.  Overall she is doing well from a vascular standpoint.  Discussed the importance of smoking cessation.  Discussed continuing dual antiplatelet therapy with aspirin Plavix from my standpoint.  We will have her follow-up in 1 year with repeat studies for ongoing surveillance.  She knows to call with questions or concerns.   Marty Heck, MD [facility] Office: [phone]

## 2020-11-11 ENCOUNTER — Ambulatory Visit (INDEPENDENT_AMBULATORY_CARE_PROVIDER_SITE_OTHER): Payer: Medicaid Other | Admitting: Podiatry

## 2020-11-11 ENCOUNTER — Other Ambulatory Visit: Payer: Self-pay

## 2020-11-11 ENCOUNTER — Encounter: Payer: Self-pay | Admitting: Podiatry

## 2020-11-11 DIAGNOSIS — L601 Onycholysis: Secondary | ICD-10-CM

## 2020-11-11 DIAGNOSIS — I739 Peripheral vascular disease, unspecified: Secondary | ICD-10-CM

## 2020-11-13 NOTE — Progress Notes (Signed)
Subjective: 65 year old female presents the office today for evaluation status post left hallux onycholysis.  She states that the area is doing well.  Denies any drainage or pus or any swelling or redness.  No significant discomfort identified today.  Some minimal occasional discomfort.  No drainage. Denies any systemic complaints such as fevers, chills, nausea, vomiting. No acute changes since last appointment, and no other complaints at this time.   Objective: AAO x3, NAD DP/PT pulses palpable bilaterally, CRT less than 3 seconds Left hallux onycholysis.  The nail bed appears to be healed there is no open lesion.  There is no drainage or pus.  No edema, erythema or signs of infection. No pain with calf compression, swelling, warmth, erythema  Assessment: Left hallux onycholysis  Plan: -All treatment options discussed with the patient including all alternatives, risks, complications.  -Nailbed site is healed.  I would continue antibiotic ointment and a bandage as a precaution.  Offloading.  Monitor for any signs or symptoms of infection or open lesions. -Patient encouraged to call the office with any questions, concerns, change in symptoms.   Trula Slade DPM

## 2021-01-13 ENCOUNTER — Ambulatory Visit (INDEPENDENT_AMBULATORY_CARE_PROVIDER_SITE_OTHER): Payer: Medicaid Other | Admitting: Podiatry

## 2021-01-13 ENCOUNTER — Other Ambulatory Visit: Payer: Self-pay

## 2021-01-13 ENCOUNTER — Encounter: Payer: Self-pay | Admitting: Podiatry

## 2021-01-13 DIAGNOSIS — Z7901 Long term (current) use of anticoagulants: Secondary | ICD-10-CM | POA: Diagnosis not present

## 2021-01-13 DIAGNOSIS — M79674 Pain in right toe(s): Secondary | ICD-10-CM | POA: Diagnosis not present

## 2021-01-13 DIAGNOSIS — M79675 Pain in left toe(s): Secondary | ICD-10-CM

## 2021-01-13 DIAGNOSIS — B351 Tinea unguium: Secondary | ICD-10-CM

## 2021-01-14 NOTE — Progress Notes (Signed)
Subjective: 65 y.o. returns the office today for painful, elongated, thickened toenails which she cannot trim herself. Denies any redness or drainage around the nails.  The left side of the nail came off is been doing well but any drainage or pus or swelling the wound appears to be healed.  Denies any acute changes since last appointment and no new complaints today. Denies any systemic complaints such as fevers, chills, nausea, vomiting.   PCP: Kerin Perna, NP   Objective: AAO 3, NAD DP/PT pulses palpable, CRT less than 3 seconds Nails hypertrophic, dystrophic, elongated, brittle, discolored 9. There is tenderness overlying the nails 1-5 bilaterally except the left hallux with no nails present. There is no surrounding erythema or drainage along the nail sites. No open lesions or pre-ulcerative lesions are identified. No pain with calf compression, swelling, warmth, erythema.  Assessment: Patient presents with symptomatic onychomycosis  Plan: -Treatment options including alternatives, risks, complications were discussed -Nails sharply debrided 9 without complication/bleeding. -Discussed daily foot inspection. If there are any changes, to call the office immediately.  -Follow-up in 3 months or sooner if any problems are to arise. In the meantime, encouraged to call the office with any questions, concerns, changes symptoms.  Celesta Gentile, DPM

## 2021-02-11 ENCOUNTER — Inpatient Hospital Stay (HOSPITAL_COMMUNITY)
Admission: EM | Admit: 2021-02-11 | Discharge: 2021-02-17 | DRG: 150 | Disposition: A | Discharge status: Home / Self Care | Payer: Medicaid Other | Attending: Student in an Organized Health Care Education/Training Program | Admitting: Student in an Organized Health Care Education/Training Program

## 2021-02-11 ENCOUNTER — Other Ambulatory Visit: Payer: Self-pay

## 2021-02-11 DIAGNOSIS — Z8249 Family history of ischemic heart disease and other diseases of the circulatory system: Secondary | ICD-10-CM

## 2021-02-11 DIAGNOSIS — D6832 Hemorrhagic disorder due to extrinsic circulating anticoagulants: Secondary | ICD-10-CM | POA: Diagnosis present

## 2021-02-11 DIAGNOSIS — Z7952 Long term (current) use of systemic steroids: Secondary | ICD-10-CM

## 2021-02-11 DIAGNOSIS — I259 Chronic ischemic heart disease, unspecified: Secondary | ICD-10-CM | POA: Diagnosis present

## 2021-02-11 DIAGNOSIS — I5041 Acute combined systolic (congestive) and diastolic (congestive) heart failure: Secondary | ICD-10-CM | POA: Diagnosis present

## 2021-02-11 DIAGNOSIS — J45909 Unspecified asthma, uncomplicated: Secondary | ICD-10-CM | POA: Diagnosis present

## 2021-02-11 DIAGNOSIS — Z20822 Contact with and (suspected) exposure to covid-19: Secondary | ICD-10-CM | POA: Diagnosis present

## 2021-02-11 DIAGNOSIS — I110 Hypertensive heart disease with heart failure: Secondary | ICD-10-CM | POA: Diagnosis present

## 2021-02-11 DIAGNOSIS — E876 Hypokalemia: Secondary | ICD-10-CM | POA: Diagnosis present

## 2021-02-11 DIAGNOSIS — Z7901 Long term (current) use of anticoagulants: Secondary | ICD-10-CM | POA: Diagnosis not present

## 2021-02-11 DIAGNOSIS — Z95820 Peripheral vascular angioplasty status with implants and grafts: Secondary | ICD-10-CM

## 2021-02-11 DIAGNOSIS — D62 Acute posthemorrhagic anemia: Secondary | ICD-10-CM | POA: Diagnosis present

## 2021-02-11 DIAGNOSIS — R040 Epistaxis: Principal | ICD-10-CM | POA: Diagnosis present

## 2021-02-11 DIAGNOSIS — I4891 Unspecified atrial fibrillation: Secondary | ICD-10-CM | POA: Diagnosis present

## 2021-02-11 DIAGNOSIS — Z833 Family history of diabetes mellitus: Secondary | ICD-10-CM

## 2021-02-11 DIAGNOSIS — D509 Iron deficiency anemia, unspecified: Secondary | ICD-10-CM | POA: Diagnosis present

## 2021-02-11 DIAGNOSIS — F17210 Nicotine dependence, cigarettes, uncomplicated: Secondary | ICD-10-CM | POA: Diagnosis present

## 2021-02-11 DIAGNOSIS — Z635 Disruption of family by separation and divorce: Secondary | ICD-10-CM

## 2021-02-11 DIAGNOSIS — Z79899 Other long term (current) drug therapy: Secondary | ICD-10-CM

## 2021-02-11 DIAGNOSIS — I4892 Unspecified atrial flutter: Secondary | ICD-10-CM | POA: Diagnosis present

## 2021-02-11 DIAGNOSIS — Z681 Body mass index (BMI) 19 or less, adult: Secondary | ICD-10-CM

## 2021-02-11 DIAGNOSIS — I739 Peripheral vascular disease, unspecified: Secondary | ICD-10-CM | POA: Diagnosis present

## 2021-02-11 DIAGNOSIS — E871 Hypo-osmolality and hyponatremia: Secondary | ICD-10-CM | POA: Diagnosis present

## 2021-02-11 DIAGNOSIS — E43 Unspecified severe protein-calorie malnutrition: Secondary | ICD-10-CM | POA: Diagnosis present

## 2021-02-11 DIAGNOSIS — Z7902 Long term (current) use of antithrombotics/antiplatelets: Secondary | ICD-10-CM

## 2021-02-11 DIAGNOSIS — Z7982 Long term (current) use of aspirin: Secondary | ICD-10-CM

## 2021-02-11 DIAGNOSIS — I10 Essential (primary) hypertension: Secondary | ICD-10-CM | POA: Diagnosis present

## 2021-02-11 LAB — CBC WITH DIFFERENTIAL/PLATELET
Abs Immature Granulocytes: 0 10*3/uL (ref 0.00–0.07)
Basophils Absolute: 0.2 10*3/uL — ABNORMAL HIGH (ref 0.0–0.1)
Basophils Relative: 2 %
Eosinophils Absolute: 0.3 10*3/uL (ref 0.0–0.5)
Eosinophils Relative: 4 %
HCT: 28.7 % — ABNORMAL LOW (ref 36.0–46.0)
Hemoglobin: 8.5 g/dL — ABNORMAL LOW (ref 12.0–15.0)
Lymphocytes Relative: 15 %
Lymphs Abs: 1.2 10*3/uL (ref 0.7–4.0)
MCH: 20.5 pg — ABNORMAL LOW (ref 26.0–34.0)
MCHC: 29.6 g/dL — ABNORMAL LOW (ref 30.0–36.0)
MCV: 69.2 fL — ABNORMAL LOW (ref 80.0–100.0)
Monocytes Absolute: 0.5 10*3/uL (ref 0.1–1.0)
Monocytes Relative: 6 %
Neutro Abs: 5.7 10*3/uL (ref 1.7–7.7)
Neutrophils Relative %: 73 %
Platelets: 356 10*3/uL (ref 150–400)
RBC: 4.15 MIL/uL (ref 3.87–5.11)
RDW: 21.7 % — ABNORMAL HIGH (ref 11.5–15.5)
WBC: 7.8 10*3/uL (ref 4.0–10.5)
nRBC: 0 % (ref 0.0–0.2)
nRBC: 0 /100 WBC

## 2021-02-11 LAB — IRON AND TIBC
Iron: 16 ug/dL — ABNORMAL LOW (ref 28–170)
Saturation Ratios: 3 % — ABNORMAL LOW (ref 10.4–31.8)
TIBC: 475 ug/dL — ABNORMAL HIGH (ref 250–450)
UIBC: 459 ug/dL

## 2021-02-11 LAB — BASIC METABOLIC PANEL
Anion gap: 9 (ref 5–15)
BUN: 10 mg/dL (ref 8–23)
CO2: 26 mmol/L (ref 22–32)
Calcium: 9.3 mg/dL (ref 8.9–10.3)
Chloride: 99 mmol/L (ref 98–111)
Creatinine, Ser: 0.74 mg/dL (ref 0.44–1.00)
GFR, Estimated: >60 mL/min (ref >60)
Glucose, Bld: 87 mg/dL (ref 70–99)
Potassium: 3.9 mmol/L (ref 3.5–5.1)
Sodium: 134 mmol/L — ABNORMAL LOW (ref 135–145)

## 2021-02-11 LAB — RESP PANEL BY RT-PCR (FLU A&B, COVID) ARPGX2
Influenza A by PCR: NEGATIVE
Influenza B by PCR: NEGATIVE
SARS Coronavirus 2 by RT PCR: NEGATIVE

## 2021-02-11 LAB — HIV ANTIBODY (ROUTINE TESTING W REFLEX): HIV Screen 4th Generation wRfx: NONREACTIVE

## 2021-02-11 LAB — MRSA NEXT GEN BY PCR, NASAL: MRSA by PCR Next Gen: NOT DETECTED

## 2021-02-11 LAB — PROTIME-INR
INR: 1.1 (ref 0.8–1.2)
Prothrombin Time: 14.1 seconds (ref 11.4–15.2)

## 2021-02-11 LAB — FERRITIN: Ferritin: 5 ng/mL — ABNORMAL LOW (ref 11–307)

## 2021-02-11 LAB — MAGNESIUM: Magnesium: 1.9 mg/dL (ref 1.7–2.4)

## 2021-02-11 LAB — APTT: aPTT: 34 seconds (ref 24–36)

## 2021-02-11 MED ORDER — TRANEXAMIC ACID FOR EPISTAXIS
500.0000 mg | Freq: Once | TOPICAL | Status: AC
  Administered 2021-02-11: 11:00:00 500 mg via TOPICAL
  Filled 2021-02-11 (×4): qty 10

## 2021-02-11 MED ORDER — DILTIAZEM HCL-DEXTROSE 125-5 MG/125ML-% IV SOLN (PREMIX)
5.0000 mg/h | INTRAVENOUS | Status: DC
  Administered 2021-02-11: 5 mg/h via INTRAVENOUS
  Filled 2021-02-11: qty 125

## 2021-02-11 MED ORDER — ACETAMINOPHEN 325 MG PO TABS
650.0000 mg | ORAL_TABLET | Freq: Four times a day (QID) | ORAL | Status: DC | PRN
  Administered 2021-02-12 – 2021-02-14 (×2): 650 mg via ORAL
  Filled 2021-02-11 (×2): qty 2

## 2021-02-11 MED ORDER — SALINE SPRAY 0.65 % NA SOLN
1.0000 | NASAL | Status: DC | PRN
  Administered 2021-02-11: 1 via NASAL
  Filled 2021-02-11: qty 44

## 2021-02-11 MED ORDER — METOPROLOL TARTRATE 25 MG PO TABS
25.0000 mg | ORAL_TABLET | Freq: Two times a day (BID) | ORAL | Status: DC
  Administered 2021-02-11 – 2021-02-16 (×11): 25 mg via ORAL
  Filled 2021-02-11 (×11): qty 1

## 2021-02-11 MED ORDER — ONDANSETRON HCL 4 MG PO TABS: 4.0000 mg | ORAL_TABLET | Freq: Four times a day (QID) | ORAL | Status: DC | PRN

## 2021-02-11 MED ORDER — METOPROLOL TARTRATE 25 MG PO TABS
25.0000 mg | ORAL_TABLET | Freq: Once | ORAL | Status: AC | PRN
  Administered 2021-02-12: 25 mg via ORAL
  Filled 2021-02-11: qty 1

## 2021-02-11 MED ORDER — DILTIAZEM LOAD VIA INFUSION
10.0000 mg | Freq: Once | INTRAVENOUS | Status: DC
  Filled 2021-02-11: qty 10

## 2021-02-11 MED ORDER — DILTIAZEM LOAD VIA INFUSION
10.0000 mg | Freq: Once | INTRAVENOUS | Status: AC
  Administered 2021-02-11: 10 mg via INTRAVENOUS
  Filled 2021-02-11: qty 10

## 2021-02-11 MED ORDER — METOPROLOL TARTRATE 25 MG PO TABS
25.0000 mg | ORAL_TABLET | Freq: Two times a day (BID) | ORAL | Status: DC
  Filled 2021-02-11: qty 1

## 2021-02-11 MED ORDER — DILTIAZEM HCL 25 MG/5ML IV SOLN: 10.0000 mg | Freq: Once | INTRAVENOUS | Status: DC

## 2021-02-11 MED ORDER — OXYMETAZOLINE HCL 0.05 % NA SOLN
2.0000 | Freq: Once | NASAL | Status: AC
  Administered 2021-02-11: 10:00:00 2 via NASAL

## 2021-02-11 MED ORDER — DILTIAZEM LOAD VIA INFUSION
15.0000 mg | Freq: Once | INTRAVENOUS | Status: AC
  Administered 2021-02-11: 15 mg via INTRAVENOUS
  Filled 2021-02-11: qty 15

## 2021-02-11 MED ORDER — FLUTICASONE FUROATE-VILANTEROL 100-25 MCG/INH IN AEPB
1.0000 | INHALATION_SPRAY | Freq: Every day | RESPIRATORY_TRACT | Status: DC
  Administered 2021-02-12 – 2021-02-17 (×6): 1 via RESPIRATORY_TRACT
  Filled 2021-02-11: qty 28

## 2021-02-11 MED ORDER — ENSURE ENLIVE PO LIQD
237.0000 mL | Freq: Two times a day (BID) | ORAL | Status: DC
  Administered 2021-02-12: 237 mL via ORAL
  Filled 2021-02-11: qty 237

## 2021-02-11 MED ORDER — ACETAMINOPHEN 650 MG RE SUPP: 650.0000 mg | Freq: Four times a day (QID) | RECTAL | Status: DC | PRN

## 2021-02-11 MED ORDER — ONDANSETRON HCL 4 MG/2ML IJ SOLN: 4.0000 mg | Freq: Four times a day (QID) | INTRAMUSCULAR | Status: DC | PRN

## 2021-02-11 MED ORDER — ALBUTEROL SULFATE (2.5 MG/3ML) 0.083% IN NEBU: 2.5000 mg | INHALATION_SOLUTION | Freq: Four times a day (QID) | RESPIRATORY_TRACT | Status: DC | PRN

## 2021-02-11 MED ORDER — SODIUM CHLORIDE 0.9 % IV SOLN
510.0000 mg | INTRAVENOUS | Status: DC
  Administered 2021-02-11: 510 mg via INTRAVENOUS
  Filled 2021-02-11: qty 17

## 2021-02-11 MED ORDER — POLYETHYLENE GLYCOL 3350 17 G PO PACK: 17.0000 g | PACK | Freq: Every day | ORAL | Status: DC | PRN

## 2021-02-11 MED ORDER — ATORVASTATIN CALCIUM 10 MG PO TABS
20.0000 mg | ORAL_TABLET | Freq: Every day | ORAL | Status: DC
  Administered 2021-02-12 – 2021-02-17 (×6): 20 mg via ORAL
  Filled 2021-02-11 (×6): qty 2

## 2021-02-11 MED ORDER — DILTIAZEM HCL-DEXTROSE 125-5 MG/125ML-% IV SOLN (PREMIX)
5.0000 mg/h | INTRAVENOUS | Status: DC
  Filled 2021-02-11: qty 125

## 2021-02-11 NOTE — Consult Note (Signed)
Reason for Consult:nosebleed Referring Physician: ER MD  Veronica Stewart is an 65 y.o. female.  HPI: 6 hours of right sided epistaxis not stopping with conservative measures. On Plavix.  Past Medical History:  Diagnosis Date   Asthma    Hypertension     Past Surgical History:  Procedure Laterality Date   ABDOMINAL AORTOGRAM W/LOWER EXTREMITY Bilateral 03/05/2020   Procedure: ABDOMINAL AORTOGRAM W/LOWER EXTREMITY;  Surgeon: Marty Heck, MD;  Location: [place] [facility];  Service: Cardiovascular;  Laterality: Bilateral;   ENDARTERECTOMY FEMORAL Left 03/24/2020   Procedure: LEFT COMMON FEMORAL ENDARTERECTOMY;  Surgeon: Marty Heck, MD;  Location: [place];  Service: Vascular;  Laterality: Left;   ENDOVEIN HARVEST OF GREATER SAPHENOUS VEIN Left 03/24/2020   Procedure: HARVEST OF GREATER SAPHENOUS VEIN;  Surgeon: Marty Heck, MD;  Location: [place];  Service: Vascular;  Laterality: Left;   FEMORAL-POPLITEAL BYPASS GRAFT Left 03/24/2020   Procedure: BYPASS GRAFT FEMORAL-POPLITEAL ARTERY;  Surgeon: Marty Heck, MD;  Location: [place];  Service: Vascular;  Laterality: Left;   INSERTION OF ILIAC STENT Left 03/24/2020   Procedure: INSERTION OF RETROGRADE ILIAC STENT;  Surgeon: Marty Heck, MD;  Location: [place];  Service: Vascular;  Laterality: Left;   INTRAMEDULLARY (IM) NAIL INTERTROCHANTERIC Left 03/18/2018   Procedure: INTRAMEDULLARY (IM) NAIL INTERTROCHANTRIC;  Surgeon: Shona Needles, MD;  Location: [place];  Service: Orthopedics;  Laterality: Left;   INTRAOPERATIVE ARTERIOGRAM Left 03/24/2020   Procedure: INTRA OPERATIVE ARTERIOGRAM;  Surgeon: Marty Heck, MD;  Location[facility];  Service: Vascular;  Laterality: Left;    Family History  Problem Relation Age of Onset   Sudden Cardiac Death Neg Hx     Social History:  reports that she has been smoking cigarettes. She has been smoking an average of 0.50 packs per day. She has never used smokeless tobacco.  She reports current alcohol use. She reports previous drug use. Drug: Marijuana.  Allergies: No Known Allergies  Medications: I have reviewed the patient's current medications.  Results for orders placed or performed during the hospital encounter of 02/11/21 (from the past 48 hour(s))  Basic metabolic panel     Status: Abnormal   Collection Time: 02/11/21  9:38 AM  Result Value Ref Range   Sodium 134 (L) 135 - 145 mmol/L   Potassium 3.9 3.5 - 5.1 mmol/L   Chloride 99 98 - 111 mmol/L   CO2 26 22 - 32 mmol/L   Glucose, Bld 87 70 - 99 mg/dL    Comment: Glucose reference range applies only to samples taken after fasting for at least 8 hours.   BUN 10 8 - 23 mg/dL   Creatinine, Ser 0.74 0.44 - 1.00 mg/dL   Calcium 9.3 8.9 - 10.3 mg/dL   GFR, Estimated >60 >60 mL/min    Comment: (NOTE) Calculated using the CKD-EPI Creatinine Equation (2021)    Anion gap 9 5 - 15    Comment: Performed at [facility] [address], [place], [place] [address]  CBC with Differential     Status: Abnormal   Collection Time: 02/11/21  9:38 AM  Result Value Ref Range   WBC 7.8 4.0 - 10.5 K/uL   RBC 4.15 3.87 - 5.11 MIL/uL   Hemoglobin 8.5 (L) 12.0 - 15.0 g/dL    Comment: Reticulocyte Hemoglobin testing may be clinically indicated, consider ordering this additional test [id number]    HCT 28.7 (L) 36.0 - 46.0 %   MCV 69.2 (L)  80.0 - 100.0 fL   MCH 20.5 (L) 26.0 - 34.0 pg   MCHC 29.6 (L) 30.0 - 36.0 g/dL   RDW 21.7 (H) 11.5 - 15.5 %   Platelets 356 150 - 400 K/uL    Comment: REPEATED TO VERIFY   nRBC 0.0 0.0 - 0.2 %   Neutrophils Relative % 73 %   Neutro Abs 5.7 1.7 - 7.7 K/uL   Lymphocytes Relative 15 %   Lymphs Abs 1.2 0.7 - 4.0 K/uL   Monocytes Relative 6 %   Monocytes Absolute 0.5 0.1 - 1.0 K/uL   Eosinophils Relative 4 %   Eosinophils Absolute 0.3 0.0 - 0.5 K/uL   Basophils Relative 2 %   Basophils Absolute 0.2 (H) 0.0 - 0.1 K/uL   nRBC 0 0 /100 WBC   Abs Immature Granulocytes 0.00  0.00 - 0.07 K/uL   Target Cells PRESENT     Comment: Performed at [facility], [address] [place], [place] [address]    No results found.  Review of Systems Blood pressure (!) 162/120, pulse (!) 152, resp. rate 16, height 5\' 9"  (1.753 m), weight 48 kg, SpO2 100 %.  Physical Exam Right septal deviation Active bleeding right side Mouth normal Neck without mass or nodes Chest clear CNs intact Ears normal  Assessment/Plan: Epistaxis - right  I packed nose.  Bleeding stopped.  Patient being admitted to medicine for other issues.  Pack to be removed Friday or Saturday (day #3).  Marcina Millard 02/11/2021, 11:57 AM

## 2021-02-11 NOTE — Consult Note (Addendum)
Cardiology Consultation:   Patient ID: Veronica Stewart MRN: [id number]; DOB: 1956/03/23  Admit date: 02/11/2021 Date of Consult: 02/11/2021  PCP:  Kerin Perna, NP   [facility] Providers Cardiologist:  Donato Heinz, MD   Patient Profile:   Veronica Stewart is a 65 y.o. female with a hx of PAD s/p fem-pop bypass, HTN, asthma who is being seen 02/11/2021 for the evaluation of new onset aflutter at the request of Dr. Evette Doffing.  History of Present Illness:   Veronica Stewart is a 65 yo female with PMH noted above. She has never been seen by cardiology in the past. She has been followed by [facility] left sided fem-pop bypass with left external iliac stenting with Dr. Carlis Abbott 03/2020.  Has been on DAPT with aspirin/Plavix since.  Presented to the ED on 6/29 with sinus congestion, sneezed a large clot which resulted in epistasis. Called EMS as she was unable to stop the bleeding.  In the ED her labs showed Na+ 134, K+ 3.9, Cr 0.74, Hgb 8.5. EKG showed new onset aflutter, 142 bpm. Started on IV Diltiazem with bolus. She was seen by ENT, Dr. Marcelline Deist with packing placed to right nare. She was admitted to IM for further management. Cardiology consulted.   In talking with patient she reports long history of epistaxis dating back to her childhood.  Had a visit to the ED 3/22 with epistaxis and had to have a Rhino Rocket placed.  Followed up with ENT.    Past Medical History:  Diagnosis Date   Asthma    Hypertension     Past Surgical History:  Procedure Laterality Date   ABDOMINAL AORTOGRAM W/LOWER EXTREMITY Bilateral 03/05/2020   Procedure: ABDOMINAL AORTOGRAM W/LOWER EXTREMITY;  Surgeon: Marty Heck, MD;  Location: [facility];  Service: Cardiovascular;  Laterality: Bilateral;   ENDARTERECTOMY FEMORAL Left 03/24/2020   Procedure: LEFT COMMON FEMORAL ENDARTERECTOMY;  Surgeon: Marty Heck, MD;  Location: [place];  Service: Vascular;  Laterality: Left;   ENDOVEIN  HARVEST OF GREATER SAPHENOUS VEIN Left 03/24/2020   Procedure: HARVEST OF GREATER SAPHENOUS VEIN;  Surgeon: Marty Heck, MD;  Location: [place];  Service: Vascular;  Laterality: Left;   FEMORAL-POPLITEAL BYPASS GRAFT Left 03/24/2020   Procedure: BYPASS GRAFT FEMORAL-POPLITEAL ARTERY;  Surgeon: Marty Heck, MD;  Location: [place];  Service: Vascular;  Laterality: Left;   INSERTION OF ILIAC STENT Left 03/24/2020   Procedure: INSERTION OF RETROGRADE ILIAC STENT;  Surgeon: Marty Heck, MD;  Location: [place];  Service: Vascular;  Laterality: Left;   INTRAMEDULLARY (IM) NAIL INTERTROCHANTERIC Left 03/18/2018   Procedure: INTRAMEDULLARY (IM) NAIL INTERTROCHANTRIC;  Surgeon: Shona Needles, MD;  Location: [place];  Service: Orthopedics;  Laterality: Left;   INTRAOPERATIVE ARTERIOGRAM Left 03/24/2020   Procedure: INTRA OPERATIVE ARTERIOGRAM;  Surgeon: Marty Heck, MD;  Location: [facility];  Service: Vascular;  Laterality: Left;     Home Medications:  Prior to Admission medications   Medication Sig Start Date End Date Taking? Authorizing Provider  acetaminophen (TYLENOL) 500 MG tablet Take 500 mg by mouth every 6 (six) hours as needed for mild pain.    [provider]  albuterol (VENTOLIN HFA) 108 (90 Base) MCG/ACT inhaler INHALE 2 PUFFS BY MOUTH EVERY 6 HOURS AS NEEDED FOR WHEEZING AND SHORTNESS OF BREATH Patient taking differently: Inhale 2 puffs into the lungs every 6 (six) hours as needed for wheezing or shortness of breath. 12/06/19   Kerin Perna, NP  amoxicillin-clavulanate (AUGMENTIN) 875-125 MG tablet Take 1 tablet by mouth every 12 (twelve) hours. 10/28/20   Caccavale, Sophia, PA-C  aspirin EC 81 MG EC tablet Take 1 tablet (81 mg total) by mouth daily at 6 (six) AM. Swallow whole. 03/28/20   Baglia, Corrina, PA-C  atorvastatin (LIPITOR) 20 MG tablet Take 1 tablet (20 mg total) by mouth daily. 03/27/20   Baglia, Corrina, PA-C  ciprofloxacin (CIPRO) 500 MG tablet Take 1  tablet (500 mg total) by mouth 2 (two) times daily. Patient not taking: No sig reported 03/18/20   Marty Heck, MD  clopidogrel (PLAVIX) 75 MG tablet TAKE 1 TABLET(75 MG) BY MOUTH DAILY AT 6 AM 08/25/20   Angelia Mould, MD  Fluticasone-Salmeterol (ADVAIR) 100-50 MCG/DOSE AEPB Inhale 1 puff into the lungs 2 (two) times daily. Patient taking differently: Inhale 1 puff into the lungs daily as needed (shortness of breath). 12/06/19   Kerin Perna, NP  mupirocin ointment (BACTROBAN) 2 % Apply 1 application topically 2 (two) times daily. 02/13/20   Trula Slade, DPM  mupirocin ointment (BACTROBAN) 2 % Apply 1 application topically 2 (two) times daily. 10/28/20   Trula Slade, DPM  Nutritional Supplements (ENSURE ACTIVE HIGH PROTEIN) LIQD Take 237 oz by mouth 2 (two) times daily. 12/24/19   Kerin Perna, NP  oxyCODONE-acetaminophen (PERCOCET) 5-325 MG tablet Take 1 tablet by mouth every 4 (four) hours as needed for severe pain. Patient not taking: Reported on 11/04/2020 04/22/20 04/22/21  Marty Heck, MD  PROLIA 60 MG/ML SOSY injection Inject 60 mg into the skin every 6 (six) months.  02/05/20   [provider]  Vitamin D, Ergocalciferol, (DRISDOL) 1.25 MG (50000 UNIT) CAPS capsule Take 50,000 Units by mouth 2 (two) times a week. Monday and Thursday 12/03/19   [provider]    Inpatient Medications: Scheduled Meds:  albuterol  2.5 mg Nebulization Once   atorvastatin  20 mg Oral Daily   feeding supplement  237 mL Oral BID   fluticasone furoate-vilanterol  1 puff Inhalation Daily   Continuous Infusions:  diltiazem (CARDIZEM) infusion 10 mg/hr (02/11/21 1323)   PRN Meds: acetaminophen **OR** acetaminophen, albuterol, ondansetron **OR** ondansetron (ZOFRAN) IV, polyethylene glycol  Allergies:   No Known Allergies  Social History:   Social History   Socioeconomic History   Marital status: Legally Separated    Spouse name: Not on file    Number of children: Not on file   Years of education: Not on file   Highest education level: Not on file  Occupational History   Not on file  Tobacco Use   Smoking status: Every Day    Packs/day: 0.50    Pack years: 0.00    Types: Cigarettes   Smokeless tobacco: Never  Vaping Use   Vaping Use: Never used  Substance and Sexual Activity   Alcohol use: Yes    Comment: two 40 oz beers most days    Drug use: Not Currently    Types: Marijuana   Sexual activity: Not on file  Other Topics Concern   Not on file  Social History Narrative   Not on file   Social Determinants of Health   Financial Resource Strain: Not on file  Food Insecurity: Not on file  Transportation Needs: Not on file  Physical Activity: Not on file  Stress: Not on file  Social Connections: Not on file  Intimate Partner Violence: Not on file    Family History:  Family History  Problem Relation Age of Onset   Sudden Cardiac Death Neg Hx      ROS:  Please see the history of present illness.   All other ROS reviewed and negative.     Physical Exam/Data:   Vitals:   02/11/21 1200 02/11/21 1215 02/11/21 1330 02/11/21 1500  BP: (!) 175/138 (!) 152/117 (!) 150/102 (!) 148/109  Pulse:    85  Resp:   15 16  SpO2:   96% 99%  Weight:      Height:       No intake or output data in the 24 hours ending 02/11/21 1557 Last 3 Weights 02/11/2021 11/04/2020 10/28/2020  Weight (lbs) 105 lb 13.1 oz 106 lb 103 lb  Weight (kg) 48 kg 48.081 kg 46.72 kg     Body mass index is 15.63 kg/m.  General:  Well nourished, well developed, in no acute distress HEENT: Right nare with packing Lymph: no adenopathy Neck: no JVD Endocrine:  No thryomegaly Vascular: No carotid bruits; FA pulses 2+ bilaterally without bruits  Cardiac:  normal S1, S2; RRR; no murmur  Lungs:  clear to auscultation bilaterally, no wheezing, rhonchi or rales  Abd: soft, nontender, no hepatomegaly  Ext: no edema Musculoskeletal:  No deformities,  BUE and BLE strength normal and equal Skin: warm and dry  Neuro:  CNs 2-12 intact, no focal abnormalities noted Psych:  Normal affect   EKG:  The EKG was personally reviewed and demonstrates:  Atrial flutter  Telemetry:  Telemetry was personally reviewed and demonstrates:  Atrial flutter rates in the 140s, converted to SR around 1530  Relevant CV Studies:  N/a   Laboratory Data:  High Sensitivity Troponin:  No results for input(s): TROPONINIHS in the last 720 hours.   Chemistry Recent Labs  Lab 02/11/21 0938  NA 134*  K 3.9  CL 99  CO2 26  GLUCOSE 87  BUN 10  CREATININE 0.74  CALCIUM 9.3  GFRNONAA >60  ANIONGAP 9    No results for input(s): PROT, ALBUMIN, AST, ALT, ALKPHOS, BILITOT in the last 168 hours. Hematology Recent Labs  Lab 02/11/21 0938  WBC 7.8  RBC 4.15  HGB 8.5*  HCT 28.7*  MCV 69.2*  MCH 20.5*  MCHC 29.6*  RDW 21.7*  PLT 356   BNPNo results for input(s): BNP, PROBNP in the last 168 hours.  DDimer No results for input(s): DDIMER in the last 168 hours.   Radiology/Studies:  No results found.   Assessment and Plan:   Veronica Stewart is a 65 y.o. female with a hx of PAD s/p fem-pop bypass, HTN, asthma who is being seen 02/11/2021 for the evaluation of new onset aflutter at the request of Dr. Evette Doffing.  New onset Atrial Flutter RVR: Initially in the 140s on admission. Converted to SR after Diltiazem bolus/drip. Would favor transitioning over to metoprolol, start 25mg  BID.  -- check echo -- question whether her epistasis triggered the Afib? Given her recurrent nose bleeds, this does complicate the issue of anticoagulation. ChadsVasc of at least 3. May be reasonable, if no recurrence this admission to DC with outpatient cardiac monitor on ASA alone. If recurrent aflutter noted, then would need to revisit anticoagulation.  -- check TSH  Epistasis: reports long history of freq nose bleeds. Has had to have packing placed now with two separate episodes.  ENT consulted in the ED with packing placed to right the right nare.  -- packing to be removed Friday/Saturday  HTN: elevated in  the ED -- notes indicate her BP meds were stopped in the past for hypotension. As above will stop Dilt drip and transition to metoprolol 25mg  BID  PAD s/p fem-pop bypass: with left external iliac stenting 8/21. Has been on ASA/plavix since that time. Suspect she should been able to stop DAPT. Reasonable to continue on ASA as monotherapy, especially given her recurrent bleeding.  Anemia: in the setting of acute blood loss from nose bleed. Hgb 8.5 on admission. Was 8.6 with previous ER visit.  -- ASA/plavix held on admission  Asthma: PRN albuterol   Risk Assessment/Risk Scores:   CHA2DS2-VASc Score = 3  This indicates a 3.2% annual risk of stroke. The patient's score is based upon: CHF History: No HTN History: Yes Diabetes History: No Stroke History: No Vascular Disease History: Yes Age Score: 0 Gender Score: 1   For questions or updates, please contact [place] Please consult [email or web address] for contact info under    Signed, Reino Bellis, NP  02/11/2021 3:57 PM  Patient seen and examined.  Agree with the documentation.  Veronica Stewart is a 65 year old female with a history of PAD status post femoropopliteal bypass, asthma, hypertension who we are consulted by Dr. Evette Doffing for evaluation of atrial flutter.  She underwent left femoropopliteal bypass and left external iliac stenting with Dr. Carlis Abbott in August 2021.  She has been on aspirin and Plavix since that time.  She has chronic issues with epistaxis but has been worse since that time.  She had to go to the ED in March 2022 and had to have [employer] placed.  She presented to the ED today with epistaxis.  She was seen by ENT and underwent nasal packing.  EKG on presentation however showed new onset atrial flutter, rate 142.  Vitals on admission otherwise notable for BP 151/115, SPO2 100% on room air.   Labs notable for creatinine 0.74, sodium 134, potassium 3.9, WBC 7.8, platelets 56, hemoglobin 8.5 (was 8.6 in March).  She was started on diltiazem drip.  Review of telemetry shows she was in atrial flutter with rates 140s and subsequently converted to normal sinus rhythm, currently with rate 80s to 90s.  On exam, patient is alert and oriented, regular rate and rhythm, no murmurs, lungs CTAB, no LE edema or JVD.  Given her epistaxis, and considering her iliac stent was placed last August, recommend discontinuing Plavix.  Would plan to continue on aspirin alone.  For atrial flutter, she converted to sinus rhythm.  Would stop diltiazem drip and start p.o. metoprolol.  Will check echocardiogram.  Her CHA2DS2-VASc score is 3, however in the setting of her issues with epistaxis, would hold anticoagulation for now.  Can plan cardiac monitor on discharge, if having recurrent aflutter can consider starting Eliquis (and discontinuing aspirin).  Donato Heinz, MD

## 2021-02-11 NOTE — ED Notes (Signed)
Attempted to call report, RN unavailable, number and name left with secretary for call back.

## 2021-02-11 NOTE — ED Provider Notes (Signed)
[facility] EMERGENCY DEPARTMENT Provider Note   CSN: [id number] Arrival date & time: 02/11/21  9628     History Chief Complaint  Patient presents with   Epistaxis    Veronica Stewart is a 65 y.o. female.  HPI 65 year old female presents with epistaxis.  Started around 8:40 AM.  Seems to be coming out of the right nare.  She has had intermittent nosebleeds since being put on Plavix.  She tells me this is for peripheral artery disease.  She is not having any shortness of breath, dizziness, chest pain.  No trauma to her nose.  She has a lot of allergies and so her nose runs a lot.  Past Medical History:  Diagnosis Date   Asthma    Hypertension     Patient Active Problem List   Diagnosis Date Noted   Uncontrolled atrial flutter ([place]) 02/11/2021   Epistaxis    PAD (peripheral artery disease) ([place]) 03/24/2020   Peripheral arterial occlusive disease ([place]) 03/24/2020   Critical lower limb ischemia (HCC) 03/04/2020   Post-operative pain    Acute blood loss anemia    Displaced intertrochanteric fracture of left femur, initial encounter for closed fracture ([place]) 03/18/2018   Hypertension 03/18/2018   Alcohol dependence with acute alcoholic intoxication ([place]) 03/18/2018   Hyponatremia 03/18/2018   Asthma 03/18/2018   Protein-calorie malnutrition, severe 03/18/2018    Past Surgical History:  Procedure Laterality Date   ABDOMINAL AORTOGRAM W/LOWER EXTREMITY Bilateral 03/05/2020   Procedure: ABDOMINAL AORTOGRAM W/LOWER EXTREMITY;  Surgeon: Marty Heck, MD;  Location: [place] [facility];  Service: Cardiovascular;  Laterality: Bilateral;   ENDARTERECTOMY FEMORAL Left 03/24/2020   Procedure: LEFT COMMON FEMORAL ENDARTERECTOMY;  Surgeon: Marty Heck, MD;  Location: [place];  Service: Vascular;  Laterality: Left;   ENDOVEIN HARVEST OF GREATER SAPHENOUS VEIN Left 03/24/2020   Procedure: HARVEST OF GREATER SAPHENOUS VEIN;  Surgeon: Marty Heck, MD;   Location: [place];  Service: Vascular;  Laterality: Left;   FEMORAL-POPLITEAL BYPASS GRAFT Left 03/24/2020   Procedure: BYPASS GRAFT FEMORAL-POPLITEAL ARTERY;  Surgeon: Marty Heck, MD;  Location: [place];  Service: Vascular;  Laterality: Left;   INSERTION OF ILIAC STENT Left 03/24/2020   Procedure: INSERTION OF RETROGRADE ILIAC STENT;  Surgeon: Marty Heck, MD;  Location: [place];  Service: Vascular;  Laterality: Left;   INTRAMEDULLARY (IM) NAIL INTERTROCHANTERIC Left 03/18/2018   Procedure: INTRAMEDULLARY (IM) NAIL INTERTROCHANTRIC;  Surgeon: Shona Needles, MD;  Location: [place];  Service: Orthopedics;  Laterality: Left;   INTRAOPERATIVE ARTERIOGRAM Left 03/24/2020   Procedure: INTRA OPERATIVE ARTERIOGRAM;  Surgeon: Marty Heck, MD;  Location: [place];  Service: Vascular;  Laterality: Left;     OB History   No obstetric history on file.     Family History  Problem Relation Age of Onset   Sudden Cardiac Death Neg Hx     Social History   Tobacco Use   Smoking status: Every Day    Packs/day: 0.50    Pack years: 0.00    Types: Cigarettes   Smokeless tobacco: Never  Vaping Use   Vaping Use: Never used  Substance Use Topics   Alcohol use: Yes    Comment: two 40 oz beers most days    Drug use: Not Currently    Types: Marijuana    Home Medications Prior to Admission medications   Medication Sig Start Date End Date Taking? Authorizing Provider  acetaminophen (TYLENOL) 500 MG tablet Take 500  mg by mouth every 6 (six) hours as needed for mild pain.    [provider]  albuterol (VENTOLIN HFA) 108 (90 Base) MCG/ACT inhaler INHALE 2 PUFFS BY MOUTH EVERY 6 HOURS AS NEEDED FOR WHEEZING AND SHORTNESS OF BREATH Patient taking differently: Inhale 2 puffs into the lungs every 6 (six) hours as needed for wheezing or shortness of breath. 12/06/19   Kerin Perna, NP  amoxicillin-clavulanate (AUGMENTIN) 875-125 MG tablet Take 1 tablet by mouth every 12 (twelve)  hours. 10/28/20   Caccavale, Sophia, PA-C  aspirin EC 81 MG EC tablet Take 1 tablet (81 mg total) by mouth daily at 6 (six) AM. Swallow whole. 03/28/20   Baglia, Corrina, PA-C  atorvastatin (LIPITOR) 20 MG tablet Take 1 tablet (20 mg total) by mouth daily. 03/27/20   Baglia, Corrina, PA-C  ciprofloxacin (CIPRO) 500 MG tablet Take 1 tablet (500 mg total) by mouth 2 (two) times daily. Patient not taking: No sig reported 03/18/20   Marty Heck, MD  clopidogrel (PLAVIX) 75 MG tablet TAKE 1 TABLET(75 MG) BY MOUTH DAILY AT 6 AM 08/25/20   Angelia Mould, MD  Fluticasone-Salmeterol (ADVAIR) 100-50 MCG/DOSE AEPB Inhale 1 puff into the lungs 2 (two) times daily. Patient taking differently: Inhale 1 puff into the lungs daily as needed (shortness of breath). 12/06/19   Kerin Perna, NP  mupirocin ointment (BACTROBAN) 2 % Apply 1 application topically 2 (two) times daily. 02/13/20   Trula Slade, DPM  mupirocin ointment (BACTROBAN) 2 % Apply 1 application topically 2 (two) times daily. 10/28/20   Trula Slade, DPM  Nutritional Supplements (ENSURE ACTIVE HIGH PROTEIN) LIQD Take 237 oz by mouth 2 (two) times daily. 12/24/19   Kerin Perna, NP  oxyCODONE-acetaminophen (PERCOCET) 5-325 MG tablet Take 1 tablet by mouth every 4 (four) hours as needed for severe pain. Patient not taking: Reported on 11/04/2020 04/22/20 04/22/21  Marty Heck, MD  PROLIA 60 MG/ML SOSY injection Inject 60 mg into the skin every 6 (six) months.  02/05/20   [provider]  Vitamin D, Ergocalciferol, (DRISDOL) 1.25 MG (50000 UNIT) CAPS capsule Take 50,000 Units by mouth 2 (two) times a week. Monday and Thursday 12/03/19   [provider]    Allergies    Patient has no known allergies.  Review of Systems   Review of Systems  Constitutional:  Negative for fever.  HENT:  Positive for nosebleeds.   Respiratory:  Negative for shortness of breath.   Cardiovascular:  Negative for chest  pain.  Neurological:  Negative for dizziness and light-headedness.  All other systems reviewed and are negative.  Physical Exam Updated Vital Signs BP (!) 148/109 (BP Location: Right Arm)   Pulse 85   Resp 16   Ht 5\' 9"  (1.753 m)   Wt 48 kg   SpO2 99%   BMI 15.63 kg/m   Physical Exam Vitals and nursing note reviewed.  Constitutional:      General: She is not in acute distress.    Appearance: She is well-developed. She is not ill-appearing or diaphoretic.  HENT:     Head: Normocephalic and atraumatic.     Right Ear: External ear normal.     Left Ear: External ear normal.     Nose:     Right Nostril: Epistaxis present. No foreign body or septal hematoma.     Left Nostril: No epistaxis.     Comments: Large clot removed from right nare. After this there  is bleeding that is hard to localize and not clearly anterior Eyes:     General:        Right eye: No discharge.        Left eye: No discharge.  Cardiovascular:     Rate and Rhythm: Regular rhythm. Tachycardia present.     Heart sounds: Normal heart sounds.  Pulmonary:     Effort: Pulmonary effort is normal.     Breath sounds: Normal breath sounds.  Abdominal:     Palpations: Abdomen is soft.     Tenderness: There is no abdominal tenderness.  Skin:    General: Skin is warm and dry.  Neurological:     Mental Status: She is alert.  Psychiatric:        Mood and Affect: Mood is not anxious.    ED Results / Procedures / Treatments   Labs (all labs ordered are listed, but only abnormal results are displayed) Labs Reviewed  BASIC METABOLIC PANEL - Abnormal; Notable for the following components:      Result Value   Sodium 134 (*)    All other components within normal limits  CBC WITH DIFFERENTIAL/PLATELET - Abnormal; Notable for the following components:   Hemoglobin 8.5 (*)    HCT 28.7 (*)    MCV 69.2 (*)    MCH 20.5 (*)    MCHC 29.6 (*)    RDW 21.7 (*)    Basophils Absolute 0.2 (*)    All other components within  normal limits  RESP PANEL BY RT-PCR (FLU A&B, COVID) ARPGX2  PROTIME-INR  APTT  HIV ANTIBODY (ROUTINE TESTING W REFLEX)  IRON AND TIBC  FERRITIN  TSH  MAGNESIUM    EKG EKG Interpretation  Date/Time:  Wednesday February 11 2021 11:18:48 EDT Ventricular Rate:  144 PR Interval:  136 QRS Duration: 64 QT Interval:  346 QTC Calculation: 535 R Axis:   60 Text Interpretation: likely 2:1 atrial flutter Anterior infarct , age undetermined Abnormal ECG Confirmed by Sherwood Gambler [id number]) on 02/11/2021 11:39:26 AM  Radiology No results found.  Procedures .Critical Care  Date/Time: 02/11/2021 3:45 PM Performed by: Sherwood Gambler, MD Authorized by: Sherwood Gambler, MD   Critical care provider statement:    Critical care time (minutes):  45   Critical care time was exclusive of:  Separately billable procedures and treating other patients   Critical care was necessary to treat or prevent imminent or life-threatening deterioration of the following conditions:  Circulatory failure and cardiac failure   Critical care was time spent personally by me on the following activities:  Discussions with consultants, evaluation of patient's response to treatment, examination of patient, ordering and performing treatments and interventions, ordering and review of laboratory studies, ordering and review of radiographic studies, pulse oximetry, re-evaluation of patient's condition, obtaining history from patient or surrogate and review of old charts .Epistaxis Management  Date/Time: 02/11/2021 3:45 PM Performed by: Sherwood Gambler, MD Authorized by: Sherwood Gambler, MD   Consent:    Consent obtained:  Verbal   Consent given by:  Patient Procedure details:    Treatment site:  R anterior and R posterior   Treatment method:  Merocel sponge and nasal tampon   Treatment complexity:  Extensive   Treatment episode: recurring   Post-procedure details:    Assessment:  No improvement   Procedure completion:   Tolerated with difficulty   Medications Ordered in ED Medications  diltiazem (CARDIZEM) 1 mg/mL load via infusion 15 mg (15 mg Intravenous Bolus from  Bag 02/11/21 1210)    And  diltiazem (CARDIZEM) 125 mg in dextrose 5% 125 mL (1 mg/mL) infusion (10 mg/hr Intravenous Rate/Dose Change 02/11/21 1323)  atorvastatin (LIPITOR) tablet 20 mg (has no administration in time range)  fluticasone furoate-vilanterol (BREO ELLIPTA) 100-25 MCG/INH 1 puff (1 puff Inhalation Not Given 02/11/21 1320)  albuterol (PROVENTIL) (2.5 MG/3ML) 0.083% nebulizer solution 2.5 mg (has no administration in time range)  acetaminophen (TYLENOL) tablet 650 mg (has no administration in time range)    Or  acetaminophen (TYLENOL) suppository 650 mg (has no administration in time range)  polyethylene glycol (MIRALAX / GLYCOLAX) packet 17 g (has no administration in time range)  ondansetron (ZOFRAN) tablet 4 mg (has no administration in time range)    Or  ondansetron (ZOFRAN) injection 4 mg (has no administration in time range)  feeding supplement (ENSURE ENLIVE / ENSURE PLUS) liquid 237 mL (has no administration in time range)  diltiazem (CARDIZEM) 1 mg/mL load via infusion 10 mg (has no administration in time range)  tranexamic acid (CYKLOKAPRON) 1000 MG/10ML topical solution 500 mg (500 mg Topical Given by Other 02/11/21 1034)  oxymetazoline (AFRIN) 0.05 % nasal spray 2 spray (2 sprays Right Nare Given 02/11/21 0940)    ED Course  I have reviewed the triage vital signs and the nursing notes.  Pertinent labs & imaging results that were available during my care of the patient were reviewed by me and considered in my medical decision making (see chart for details).  Clinical Course as of 02/11/21 1546  Wed Feb 11, 2021  1123 Unfortunately I have tried pressure, Afrin, topical TXA, Murocel and Rhino Rocket, all of which the patient has either not tolerated or have not worked.  Somewhat brisk bleeding.  ENT consulted though he is  currently in the OR with an airway and will need to call back. [SG]    Clinical Course User Index [SG] Sherwood Gambler, MD   MDM Rules/Calculators/A&P                          Patient has multiple things going on.  Unbeknownst to her she has atrial flutter with RVR.  She has no palpitations, dizziness, chest pain, etc.  Discussed with Dr. Burt Knack, given unclear etiology she is clearly not a cardioversion candidate and he recommends Cardizem bolus and drip.  This has eventually slow down her heart rate.  Otherwise she was having difficult to control nosebleeds while on Plavix.  She was not tolerating packing very well.  Dr. Marcelline Deist of valuated and was able to get a merocel in far enough to inhibit bleeding. Will need admission.  Final Clinical Impression(s) / ED Diagnoses Final diagnoses:  Acute blood loss anemia  Atrial fibrillation with RVR (HCC)  Right-sided epistaxis  Atrial flutter with rapid ventricular response ([place])    Rx / DC Orders ED Discharge Orders          Ordered    Amb referral to [facility] Clinic        02/11/21 [place], MD 02/11/21 1547

## 2021-02-11 NOTE — ED Triage Notes (Signed)
Pt BIB [facility] from home for a nosebleed that started at 0745 after sneezing. Pt is on plavix. Has had this happen before in the past. Pt is hypertensive 154/116 HR Sinus Tach 140s. Denies chest pain, SOB, blurry vision, headaches. Bleeding controlled with pressure at this time. Dr. Regenia Skeeter at bedside evaluating.

## 2021-02-11 NOTE — Progress Notes (Addendum)
Paged by RN regarding elevated HR after cessation of diltiazem drip. Examined and evaluated at bedside. She denies any chest pain, palpitations, dyspnea. Noted to be in A.flutter on telemetry with HR above 130-140s. During conversation, she spontaneously converted back to sinus with HR down to 80s but converted back to a.flutter within 2 minutes. Attempted vagal maneuver with conversion back to sinus briefly. BP remained stable at 132/96. Discussed with Rn and advised to give metoprolol dose now. If HR remains elevated, can give another dose in 2 hours.

## 2021-02-11 NOTE — Plan of Care (Signed)
  Problem: Education: Goal: Knowledge of General Education information will improve Description: Including pain rating scale, medication(s)/side effects and non-pharmacologic comfort measures Outcome: Progressing   Problem: Health Behavior/Discharge Planning: Goal: Ability to manage health-related needs will improve Outcome: Progressing   Problem: Clinical Measurements: Goal: Cardiovascular complication will be avoided Outcome: Progressing   Problem: Nutrition: Goal: Adequate nutrition will be maintained Outcome: Progressing   Problem: Pain Managment: Goal: General experience of comfort will improve Outcome: Progressing

## 2021-02-11 NOTE — H&P (Addendum)
Date: 02/11/2021               Patient Name:  Veronica Stewart MRN: [id number]  DOB: [birth year] Age / Sex: 65 y.o., female   PCP: Kerin Perna, NP         Medical Service: Internal Medicine Teaching Service         Attending Physician: Dr. Evette Doffing, Mallie Mussel, *    First Contact: Dr. Johnney Ou Pager: [phone]  Second Contact: Dr. Charleen Kirks Pager: [phone]       After Hours (After 5p/  First Contact Pager: [phone]  weekends / holidays): Second Contact Pager: [phone]   Chief Complaint: Nose bleed  History of Present Illness:  Veronica Stewart is a 65 yo F w/ PMH of PAD s/p left fem/pop bypass, stent placement, HTN, Asthma presenting to [facility] with nose bleed. She was in her usual state of health until 8am today when she developed worsening sinus congestion on the right side and 'sneezed very hard' She sneezed out large clump of blood and her nose began to profusely bleed. She mentions having previous episode of nose bleeds that resolve spontaneously but due to the volume of blood, she called EMS. She denies any trauma to head or foreign object insertion. She denies any history of bleeding disorders. She denies any changes in medications. She is currently on DAPT for her PAD s/p stent since her vascular surgery on 03/27/20.  On review of systems, she denies any chest pain, palpitations, dyspnea, cough, nausea, vomiting, diarrhea, constipation, dysuria, urgency, frequency, focal weakness, dizziness, numbness, or tingling. She does endorse generalized fatigue and lack of appetite  Meds:  Aspirin 81mg  daily Clopidogrel 75mg  daily Atorvastatin 20mg  daily Albuterol 1-2 puffs PRN Fluticasone-salmeterol 1 puff BID  Allergies: Allergies as of 02/11/2021   (No Known Allergies)   Past Medical History:  Diagnosis Date   Asthma    Hypertension    Family History: Mentions multiple family members with diabetes and high blood pressure. Cousin had leukemia  Social History: Lives with  aunt. Used to work at [employer]. 1/2 pack daily smoking history >40 years. Drinks 3 cans of beer on weekends. Mentions prior hx of cocaine use but last use in 2008.  Review of Systems: A complete ROS was negative except as per HPI.   Physical Exam: Blood pressure (!) 152/117, pulse (!) 148, resp. rate 12, height 5\' 9"  (1.753 m), weight 48 kg, SpO2 100 %.  Gen: Well-developed, thin-appearing, NAD HEENT: NCAT head, hearing intact, EOMI, PERRL, Nasal packing in place with surrounding bloody mucus drainage, MMM CV: Tachycardic, irregular Pulm: CTAB, No rales, no wheezes Abd: Soft, BS+, NTND, No rebound, no guarding Extm: ROM intact, Peripheral pulses intact, No peripheral edema Skin: Dry, Warm, normal turgor, LLE well-healed surgical scar Neuro: AAOx3 Psych: Normal mood and affect  EKG: personally reviewed my interpretation is atrial flutter, HR 140  CXR: N/A  Assessment & Plan by Problem: Active Problems:   Acute blood loss anemia   Epistaxis   Uncontrolled atrial flutter (HCC)  Veronica Stewart is a 65 yo F w/ PMH of PAD s/p left fem/pop bypass, stent placement, HTN, Asthma presenting to [facility] with epistaxis found to have blood loss anemia and new atrial flutter with RVR.  Recurrent epistaxis Acute on chronic blood loss anemia Presenting with epistaxis. No obvious inciting event. Chart review shows prior ED visit for similar symptoms. Exacerbated by DAPT for her PAD. Admit hgb 8.5. MCV 69.2. ENT on board - Appreciate ENT recs: Packing  placed, remove Friday or Saturday - Iron panel, PT-INR - Hold anti-platelet therapy - Trend cbc - Transfuse as needed to keep hgb >7  Atrial Flutter with RVR On admission noted to be in 2:1 atrial flutter. HR in 140s. BP 152/117 Started on dilt in ED. Cardiology on board. CHADSVASC2 score of 3. Unable to anti-coagulate in setting of bleed - Appreciate cardiology recs - C/w dilt gtt - Check TSH, mag - Telemetry - Consider echo when HR better  controlled  PAD Hx of PAD s/p bypass, stent placement in [facility] by [facility] 03/2020. + pulses on exam. Denies sxs. On aspirin, clopidogrel, atorvastatin - Hold anti-platelet therapy - C/w atorvastatin  Malnourishment Noted BMI 15.63. Take Ensure for supplement at home - C/w Ensure - RD consult  HTN Admit bp 152/117. Chart review shows previous hx of essential hypertension on amlodipine, lisinopril, hctz. Discontinued by PCP after episode of hypotension. - Monitor - Should improve w/ dilt gtt  Asthma No wheezing on exam - C/w albuterol PRN, Advair 1 puff BID  DVT prophx: SCD Diet: HH Bowel: Miralax Code: Full  Prior to Admission Living Arrangement: Home Anticipated Discharge Location: Home Barriers to Discharge: Medical treatment  Dispo: Admit patient to Observation with expected length of stay less than 2 midnights.  Signed: Mosetta Anis, MD 02/11/2021, 1:02 PM Pager: [phone] After 5pm on weekdays and 1pm on weekends: On Call Pager: [phone]

## 2021-02-11 NOTE — ED Notes (Signed)
SWOT transporting pt to floor

## 2021-02-12 ENCOUNTER — Other Ambulatory Visit (HOSPITAL_COMMUNITY): Payer: Medicaid Other

## 2021-02-12 ENCOUNTER — Observation Stay (HOSPITAL_COMMUNITY): Payer: Medicaid Other

## 2021-02-12 DIAGNOSIS — I5041 Acute combined systolic (congestive) and diastolic (congestive) heart failure: Secondary | ICD-10-CM | POA: Diagnosis present

## 2021-02-12 DIAGNOSIS — I259 Chronic ischemic heart disease, unspecified: Secondary | ICD-10-CM | POA: Diagnosis present

## 2021-02-12 DIAGNOSIS — Z7902 Long term (current) use of antithrombotics/antiplatelets: Secondary | ICD-10-CM | POA: Diagnosis not present

## 2021-02-12 DIAGNOSIS — R040 Epistaxis: Secondary | ICD-10-CM | POA: Diagnosis present

## 2021-02-12 DIAGNOSIS — I110 Hypertensive heart disease with heart failure: Secondary | ICD-10-CM | POA: Diagnosis present

## 2021-02-12 DIAGNOSIS — I5043 Acute on chronic combined systolic (congestive) and diastolic (congestive) heart failure: Secondary | ICD-10-CM | POA: Diagnosis not present

## 2021-02-12 DIAGNOSIS — D509 Iron deficiency anemia, unspecified: Secondary | ICD-10-CM | POA: Diagnosis present

## 2021-02-12 DIAGNOSIS — I739 Peripheral vascular disease, unspecified: Secondary | ICD-10-CM | POA: Diagnosis present

## 2021-02-12 DIAGNOSIS — Z20822 Contact with and (suspected) exposure to covid-19: Secondary | ICD-10-CM | POA: Diagnosis present

## 2021-02-12 DIAGNOSIS — Z79899 Other long term (current) drug therapy: Secondary | ICD-10-CM | POA: Diagnosis not present

## 2021-02-12 DIAGNOSIS — I480 Paroxysmal atrial fibrillation: Secondary | ICD-10-CM | POA: Diagnosis not present

## 2021-02-12 DIAGNOSIS — Z7982 Long term (current) use of aspirin: Secondary | ICD-10-CM | POA: Diagnosis not present

## 2021-02-12 DIAGNOSIS — I4891 Unspecified atrial fibrillation: Secondary | ICD-10-CM | POA: Diagnosis present

## 2021-02-12 DIAGNOSIS — Z7952 Long term (current) use of systemic steroids: Secondary | ICD-10-CM | POA: Diagnosis not present

## 2021-02-12 DIAGNOSIS — J45909 Unspecified asthma, uncomplicated: Secondary | ICD-10-CM | POA: Diagnosis present

## 2021-02-12 DIAGNOSIS — Z95820 Peripheral vascular angioplasty status with implants and grafts: Secondary | ICD-10-CM | POA: Diagnosis not present

## 2021-02-12 DIAGNOSIS — D6832 Hemorrhagic disorder due to extrinsic circulating anticoagulants: Secondary | ICD-10-CM | POA: Diagnosis present

## 2021-02-12 DIAGNOSIS — I4892 Unspecified atrial flutter: Secondary | ICD-10-CM | POA: Diagnosis present

## 2021-02-12 DIAGNOSIS — E43 Unspecified severe protein-calorie malnutrition: Secondary | ICD-10-CM | POA: Diagnosis present

## 2021-02-12 DIAGNOSIS — Z635 Disruption of family by separation and divorce: Secondary | ICD-10-CM | POA: Diagnosis not present

## 2021-02-12 DIAGNOSIS — Z681 Body mass index (BMI) 19 or less, adult: Secondary | ICD-10-CM | POA: Diagnosis not present

## 2021-02-12 DIAGNOSIS — Z8249 Family history of ischemic heart disease and other diseases of the circulatory system: Secondary | ICD-10-CM | POA: Diagnosis not present

## 2021-02-12 DIAGNOSIS — E871 Hypo-osmolality and hyponatremia: Secondary | ICD-10-CM | POA: Diagnosis present

## 2021-02-12 DIAGNOSIS — D62 Acute posthemorrhagic anemia: Secondary | ICD-10-CM | POA: Diagnosis present

## 2021-02-12 DIAGNOSIS — I10 Essential (primary) hypertension: Secondary | ICD-10-CM | POA: Diagnosis not present

## 2021-02-12 DIAGNOSIS — Z833 Family history of diabetes mellitus: Secondary | ICD-10-CM | POA: Diagnosis not present

## 2021-02-12 DIAGNOSIS — F17210 Nicotine dependence, cigarettes, uncomplicated: Secondary | ICD-10-CM | POA: Diagnosis present

## 2021-02-12 LAB — COMPREHENSIVE METABOLIC PANEL
ALT: 8 U/L (ref 0–44)
AST: 13 U/L — ABNORMAL LOW (ref 15–41)
Albumin: 3 g/dL — ABNORMAL LOW (ref 3.5–5.0)
Alkaline Phosphatase: 69 U/L (ref 38–126)
Anion gap: 5 (ref 5–15)
BUN: 8 mg/dL (ref 8–23)
CO2: 27 mmol/L (ref 22–32)
Calcium: 9 mg/dL (ref 8.9–10.3)
Chloride: 98 mmol/L (ref 98–111)
Creatinine, Ser: 0.82 mg/dL (ref 0.44–1.00)
GFR, Estimated: >60 mL/min (ref >60)
Glucose, Bld: 107 mg/dL — ABNORMAL HIGH (ref 70–99)
Potassium: 3.4 mmol/L — ABNORMAL LOW (ref 3.5–5.1)
Sodium: 130 mmol/L — ABNORMAL LOW (ref 135–145)
Total Bilirubin: 0.8 mg/dL (ref 0.3–1.2)
Total Protein: 7 g/dL (ref 6.5–8.1)

## 2021-02-12 LAB — CBC
HCT: 24.1 % — ABNORMAL LOW (ref 36.0–46.0)
Hemoglobin: 7.4 g/dL — ABNORMAL LOW (ref 12.0–15.0)
MCH: 20.9 pg — ABNORMAL LOW (ref 26.0–34.0)
MCHC: 30.7 g/dL (ref 30.0–36.0)
MCV: 68.1 fL — ABNORMAL LOW (ref 80.0–100.0)
Platelets: 331 10*3/uL (ref 150–400)
RBC: 3.54 MIL/uL — ABNORMAL LOW (ref 3.87–5.11)
RDW: 20.7 % — ABNORMAL HIGH (ref 11.5–15.5)
WBC: 7.3 10*3/uL (ref 4.0–10.5)
nRBC: 0 % (ref 0.0–0.2)

## 2021-02-12 LAB — ECHOCARDIOGRAM COMPLETE
Area-P 1/2: 4.79 cm2
Height: 73 in
MV M vel: 5.41 m/s
MV Peak grad: 117.1 mmHg
Radius: 0.6 cm
S' Lateral: 3.7 cm
Weight: 1672 oz

## 2021-02-12 LAB — TSH: TSH: 2.239 u[IU]/mL (ref 0.350–4.500)

## 2021-02-12 MED ORDER — ENSURE ENLIVE PO LIQD
237.0000 mL | Freq: Three times a day (TID) | ORAL | Status: DC
  Administered 2021-02-12 – 2021-02-17 (×12): 237 mL via ORAL

## 2021-02-12 MED ORDER — MAGNESIUM SULFATE 2 GM/50ML IV SOLN
2.0000 g | Freq: Once | INTRAVENOUS | Status: AC
  Administered 2021-02-12: 2 g via INTRAVENOUS
  Filled 2021-02-12: qty 50

## 2021-02-12 MED ORDER — POTASSIUM CHLORIDE CRYS ER 20 MEQ PO TBCR
40.0000 meq | EXTENDED_RELEASE_TABLET | Freq: Two times a day (BID) | ORAL | Status: AC
  Administered 2021-02-12 (×2): 40 meq via ORAL
  Filled 2021-02-12 (×3): qty 2

## 2021-02-12 MED ORDER — ADULT MULTIVITAMIN W/MINERALS CH
1.0000 | ORAL_TABLET | Freq: Every day | ORAL | Status: DC
  Administered 2021-02-13 – 2021-02-17 (×5): 1 via ORAL
  Filled 2021-02-12 (×5): qty 1

## 2021-02-12 MED ORDER — AMIODARONE HCL 200 MG PO TABS
200.0000 mg | ORAL_TABLET | Freq: Two times a day (BID) | ORAL | Status: DC
  Administered 2021-02-12 – 2021-02-17 (×11): 200 mg via ORAL
  Filled 2021-02-12 (×11): qty 1

## 2021-02-12 NOTE — Progress Notes (Signed)
Subjective: Has not had any further bleeding since pack placement yesterday.  Objective: Vital signs in last 24 hours: Temp:  [97.6 F (36.4 C)-98.8 F (37.1 C)] 98.7 F (37.1 C) (06/30 1517) Pulse Rate:  [74-125] 74 (06/30 1517) Resp:  [11-15] 13 (06/30 1517) BP: (136-158)/(93-108) 137/93 (06/30 1517) SpO2:  [96 %-99 %] 99 % (06/30 1517) Weight:  [47.4 kg] 47.4 kg (06/30 0245) Wt Readings from Last 1 Encounters:  02/12/21 47.4 kg    Intake/Output from previous day: 06/29 0701 - 06/30 0700 In: 300 [P.O.:300] Out: -  Intake/Output this shift: No intake/output data recorded.  General appearance: alert, cooperative, and no distress Nose: Right nasal passage packed, no active bleeding  Recent Labs    02/11/21 0938 02/12/21 0005  WBC 7.8 7.3  HGB 8.5* 7.4*  HCT 28.7* 24.1*  PLT 356 331    Recent Labs    02/11/21 0938 02/12/21 0005  NA 134* 130*  K 3.9 3.4*  CL 99 98  CO2 26 27  GLUCOSE 87 107*  BUN 10 8  CREATININE 0.74 0.82  CALCIUM 9.3 9.0    Medications: I have reviewed the patient's current medications.  Assessment/Plan: Right epistaxis, anti-platelet therapy  Continue to hold anti-platelet therapy.  Plan pack removal on Saturday.   LOS: 0 days   Melida Quitter 02/12/2021, 5:26 PM

## 2021-02-12 NOTE — Progress Notes (Signed)
Pt HR sustaining 120s- BP 158/104- paged Dr Alfonse Spruce with Internal Med- orders to give prn dose of Metoprolol and monitor, keeping him up to date on her response to med. Pt is asymptomatic- EKG done.

## 2021-02-12 NOTE — Progress Notes (Signed)
  Echocardiogram 2D Echocardiogram with strain and 3D has been performed.  Veronica Stewart M 02/12/2021, 9:46 AM

## 2021-02-12 NOTE — Progress Notes (Addendum)
Subjective:  Veronica Stewart was evaluated and examined at the bedside this morning. She stated she was feeling better this morning and was thankful the packing in her nose was stopping the bleeding. She denied melena, hematuria, vaginal bleeding, and has not noticed any other areas of bleeding. She additionally denied any dyspnea, cough, and fatigue. She stated she had visited the hospital on one previous occasion for a nose bleed but did not follow up with her primary care physician after the visit. She was instructed to try and move around more today to see if she became short of breath or fatigued, and the plan to give her iron and a transfusion is she became symptomatic today was discussed. She was agreeable to the plan.  She denied any feelings of palpitations overnight but reported she was told by the nurses that her her rate had increased. She did not report any trouble taking her oral Metoprolol and said she is able to tolerate it well. She was informed of the plan to obtain an echocardiogram today to investigate the etiology of her atrial flutter, and she was agreeable to this plan.  Objective:  Vital signs in last 24 hours: Vitals:   02/11/21 2352 02/12/21 0217 02/12/21 0245 02/12/21 0334  BP: (!) 146/105 (!) 158/104  (!) 149/108  Pulse: 74 (!) 125 (!) 120 80  Resp: 15  11 14   Temp: 97.6 F (36.4 C)   98.2 F (36.8 C)  TempSrc: Oral   Oral  SpO2: 97%  96% 97%  Weight:   47.4 kg   Height:       Weight change:   Intake/Output Summary (Last 24 hours) at 02/12/2021 1040 Last data filed at 02/11/2021 1835 Gross per 24 hour  Intake 300 ml  Output --  Net 300 ml   Constitutional: well-appearing and sitting comfortably in bed, in no acute distress HENT: normocephalic atraumatic, packing in place in right nostril Eyes: conjunctiva non-erythematous Neck: supple Cardiovascular: regular rate and rhythm, no m/r/g Pulmonary/Chest: normal work of breathing on room air Abdominal: soft,  non-tender, non-distended Ext: no edema MSK: normal bulk and tone Neurological: alert & oriented x 3 Skin: warm and dry Psych: appropriate mood and affect  Assessment/Plan:  Principal Problem:   Uncontrolled atrial flutter (HCC) Active Problems:   Hypertension   Iron deficiency anemia   PAD (peripheral artery disease) (HCC)   Epistaxis  Veronica Stewart is a 65 y.o. person on hospital day 2 with medical history significant for PAD s/p left femoral/popliteal bypass, stent placement, HTN, on dual antiplatelet therapy, who presented with with epistaxis and was found to have atrial flutter who is now admitted for new atrial flutter and anemia.  Epistaxis Iron Deficiency Anemia Patient with nasal packing in place and remains asymptomatic as of this morning. Dual anti-platelet therapy likely contributing to recurrent and persistent nose bleeds. Hgb of 8.3 on admission has declined to 7.4 and warrants close monitoring. ENT is managing nasal packing. -Continue to hold aspirin/plavix -Trend CBC to monitor Hgb level and plan to transfuse if Hgb falls below 7 or patient become symptomatic -Plan to give second feraheme dose in 3-8 days which will require close follow up with PCP to schedule -Will remove packing tomorrow or Saturday per ENT  Uncontrolled Atrial Flutter New Heart Failure with mildly Reduced EF She has had continued, intermittent episodes of atrial flutter since admission. Cardiology is managing. Echo shows mildly reduced ejection fraction at 40-45%, concentric LVH, grade 1 diastolic dysfunction, mildly reduced  RV function with strain, normal atrial size. -Hold Stewart/Plavix as above -Continue metoprolol 25 mg BID -Start amiodarone 200 mg BID -Per CARDS recommendation:      -Plan to start pt on Eliquis monotherapy once okay per ENT      -If patient fails to tolerate Eliquis, Stewart 81 mg once daily monotherapy recommended. -Patient appears euvolemic, no need for lasix today. Will want  ischemic evaluation at some point. Suspect heart failure is either due to ischemic disease or longstanding hypertension.  -Will plan to start losartan 25mg  daily soon as BP allows.  Hypokalemia Pt with potassium of 3.4 and magnesium of 1.9. -Will give 2 doses of 40 mEq Klorcon  Hyponatremia Pt with sodium of 130 which is classified as mild and she is asymptomatic. Likely due to poor p.o. intake, and nutrition has been consulted. -Will check urine osm -Will trend BMP daily   DVT Prophx:SCD Diet: Normal Bowel: PRN miralax Code: Full   LOS: 0 days   Veronica Stewart, Medical Student 02/12/2021, 10:40 AM

## 2021-02-12 NOTE — Progress Notes (Signed)
Initial Nutrition Assessment  DOCUMENTATION CODES:   Underweight, Severe malnutrition in context of chronic illness  INTERVENTION:   - Liberalize diet to Regular  - Ensure Enlive po TID, each supplement provides 350 kcal and 20 grams of protein  - Magic Cup BID with meals, each supplement provides 290 kcal and 9 grams of protein  - MVI with minerals daily  NUTRITION DIAGNOSIS:   Severe Malnutrition related to chronic illness (PAD, newly diagnosed CHF) as evidenced by severe muscle depletion, severe fat depletion.  GOAL:   Patient will meet greater than or equal to 90% of their needs  MONITOR:   PO intake, Supplement acceptance, Labs, Weight trends, I & O's  REASON FOR ASSESSMENT:   Consult Assessment of nutrition requirement/status  ASSESSMENT:   65 year old female who presented to the ED on 6/29 with epistaxis. PMH of PAD s/p left fem/pop bypass, stent placement, HTN, asthma. Pt also found to have new onset atrial flutter.  Pt with iron deficiency anemia, new heart failure with mildly reduced EF.  Pt currently on a Heart Healthy diet with 1 meal completions charted of 70%. Discussed diet liberalization with MD who agreed. Diet was liberalized to Regular to promote PO intake.  Spoke with pt at bedside. RN in room providing nursing care. Pt reports good appetite this admission. She states that she consumed 100% of her [place] and cheese sandwich today. Pt preparing to drink an Ensure Enlive during RD visit. RN reports pt had already consumed 1 full Ensure Enlive supplement earlier this morning.  Pt reports that at home she eats 2 meals daily. Pt does not elaborate on what a meal consists of but states that she will eat whatever her aunt fixes. Pt states that she eats when she's hungry and stops whenever she gets full. Pt states, "I eat, but I don't eat a lot." Pt endorses snacking between means on items like oatmeal cream pies. She states that she drinks 1 Ensure a day at  home per the recommendation of her doctor.  Pt does not know her normal weight. Pt does not normally weigh herself but hasn't noticed any changes in how her clothes are fitting. Reviewed weight history in chart. Noted pt weight weight gain from July 2021 through March 2022. Current weight of 47.4 kg is down only slightly compared to weight from 11/04/20 of 48.1 kg.  Discussed importance of adequate kcal and protein intake in maintaining lean muscle mass. Pt expresses understanding. Pt agreeable to trying to drink at least 2 Ensure supplements daily. She is also willing to try the vanilla Magic Cups with meals. RD will also order a daily MVI with minerals.  Meal Completion: 70% x 1 documented meal  Medications reviewed and include: Ensure Enlive BID, IV feraheme weekly, klor-con 40 mEq x 2, IV magnesium sulfate 2 grams once  Labs reviewed: sodium 130, potassium 3.4, hemoglobin 7.4  NUTRITION - FOCUSED PHYSICAL EXAM:  Flowsheet Row Most Recent Value  Orbital Region Severe depletion  Upper Arm Region Severe depletion  Thoracic and Lumbar Region Severe depletion  Buccal Region Severe depletion  Temple Region Severe depletion  Clavicle Bone Region Severe depletion  Clavicle and Acromion Bone Region Severe depletion  Scapular Bone Region Severe depletion  Dorsal Hand Severe depletion  Patellar Region Severe depletion  Anterior Thigh Region Severe depletion  Posterior Calf Region Severe depletion  Edema (RD Assessment) None  Hair Reviewed  Eyes Reviewed  Mouth Reviewed  Skin Reviewed  Nails Reviewed  Diet Order:   Diet Order             Diet regular Room service appropriate? Yes; Fluid consistency: Thin  Diet effective now                   EDUCATION NEEDS:   Education needs have been addressed  Skin:  Skin Assessment: Reviewed RN Assessment  Last BM:  no documented BM  Height:   Ht Readings from Last 1 Encounters:  02/11/21 6\' 1"  (1.854 m)   Previous  heights in chart: 5\' 9"  and 5\' 10"   Weight:   Wt Readings from Last 1 Encounters:  02/12/21 47.4 kg    BMI:  Body mass index is 13.79 kg/m.  Estimated Nutritional Needs:   Kcal:  1650-1850  Protein:  70-85 grams  Fluid:  1.6-1.8 L    Gustavus Bryant, MS, RD, LDN Inpatient Clinical Dietitian Please see AMi[facility] for contact information.

## 2021-02-12 NOTE — Progress Notes (Signed)
Progress Note  Patient Name: Veronica Stewart Date of Encounter: 02/12/2021  [facility] Cardiologist: Donato Heinz, MD   Subjective   Denies any chest pain, dyspnea, or palpitations  Inpatient Medications    Scheduled Meds:  atorvastatin  20 mg Oral Daily   feeding supplement  237 mL Oral BID   fluticasone furoate-vilanterol  1 puff Inhalation Daily   metoprolol tartrate  25 mg Oral BID   Continuous Infusions:  ferumoxytol 510 mg (02/11/21 1741)   PRN Meds: acetaminophen **OR** acetaminophen, albuterol, ondansetron **OR** ondansetron (ZOFRAN) IV, polyethylene glycol, sodium chloride   Vital Signs    Vitals:   02/11/21 2352 02/12/21 0217 02/12/21 0245 02/12/21 0334  BP: (!) 146/105 (!) 158/104  (!) 149/108  Pulse: 74 (!) 125 (!) 120 80  Resp: 15  11 14   Temp: 97.6 F (36.4 C)   98.2 F (36.8 C)  TempSrc: Oral   Oral  SpO2: 97%  96% 97%  Weight:   47.4 kg   Height:        Intake/Output Summary (Last 24 hours) at 02/12/2021 0913 Last data filed at 02/11/2021 1835 Gross per 24 hour  Intake 300 ml  Output --  Net 300 ml   Last 3 Weights 02/12/2021 02/11/2021 02/11/2021  Weight (lbs) 104 lb 8 oz 103 lb 9.6 oz 105 lb 13.1 oz  Weight (kg) 47.401 kg 46.993 kg 48 kg      Telemetry    Intermittent atrial flutter to 120s, currently normal sinus rhythm in 70s- Personally Reviewed  ECG    No new ECG- Personally Reviewed  Physical Exam   GEN: No acute distress.   Neck: No JVD Cardiac: RRR, no murmurs, rubs, or gallops.  Respiratory: Clear to auscultation bilaterally. GI: Soft, nontender, non-distended  MS: No edema; No deformity. Neuro:  Nonfocal  Psych: Normal affect   Labs    High Sensitivity Troponin:  No results for input(s): TROPONINIHS in the last 720 hours.    Chemistry Recent Labs  Lab 02/11/21 0938 02/12/21 0005  NA 134* 130*  K 3.9 3.4*  CL 99 98  CO2 26 27  GLUCOSE 87 107*  BUN 10 8  CREATININE 0.74 0.82  CALCIUM 9.3 9.0   PROT  --  7.0  ALBUMIN  --  3.0*  AST  --  13*  ALT  --  8  ALKPHOS  --  69  BILITOT  --  0.8  GFRNONAA >60 >60  ANIONGAP 9 5     Hematology Recent Labs  Lab 02/11/21 0938 02/12/21 0005  WBC 7.8 7.3  RBC 4.15 3.54*  HGB 8.5* 7.4*  HCT 28.7* 24.1*  MCV 69.2* 68.1*  MCH 20.5* 20.9*  MCHC 29.6* 30.7  RDW 21.7* 20.7*  PLT 356 331    BNPNo results for input(s): BNP, PROBNP in the last 168 hours.   DDimer No results for input(s): DDIMER in the last 168 hours.   Radiology    No results found.  Cardiac Studies     Patient Profile     65 y.o. female with a hx of PAD s/p fem-pop bypass, HTN, asthma who is being seen 02/11/2021 for the evaluation of new onset aflutter   Assessment & Plan    New onset Atrial Flutter RVR: Initially in the 140s on admission. Continues to have intermittent episodes of AFL -- check echo -- metoprolol 25 mg BID -- Currently in sinus rhythm but given she is going in and out of atrial  flutter, recommend starting amiodarone to maintain sinus rhythm.  Will start amio 200 mg twice daily -- Not a good candidate for anticoagulation right now given her epistaxis and acute blood loss anemia.  Would recommend Eliquis once okay per ENT   Epistaxis: reports long history of freq nose bleeds. Has had to have packing placed now with two separate episodes. ENT consulted in the ED with packing placed to right the right nare. -- packing to be removed Friday/Saturday   HTN: on metoprolol 25mg  BID as above   PAD s/p fem-pop bypass: with left external iliac stenting 8/21. Has been on ASA/plavix since that time. Would plan to discontinue plavix and likely Eliquis monotherapy given atrial flutter as above (once able to start anticoagulation per ENT).  If not tolerating Eliquis, would favor aspirin 81 mg daily   Anemia: in the setting of acute blood loss from nose bleed. Hgb 8.5 on admission, down to 7.4 today -- ASA/plavix held on admission --ENT following for  epistaxis   Asthma: PRN albuterol   For questions or updates, please contact [place] Please consult [email or web address] for contact info under        Signed, Donato Heinz, MD  02/12/2021, 9:13 AM

## 2021-02-12 NOTE — Plan of Care (Signed)
  Problem: Education: Goal: Knowledge of General Education information will improve Description: Including pain rating scale, medication(s)/side effects and non-pharmacologic comfort measures Outcome: Progressing   Problem: Health Behavior/Discharge Planning: Goal: Ability to manage health-related needs will improve Outcome: Progressing   Problem: Clinical Measurements: Goal: Cardiovascular complication will be avoided Outcome: Progressing   Problem: Pain Managment: Goal: General experience of comfort will improve Outcome: Progressing   

## 2021-02-13 DIAGNOSIS — I10 Essential (primary) hypertension: Secondary | ICD-10-CM

## 2021-02-13 DIAGNOSIS — I5041 Acute combined systolic (congestive) and diastolic (congestive) heart failure: Secondary | ICD-10-CM

## 2021-02-13 LAB — CBC
HCT: 25.5 % — ABNORMAL LOW (ref 36.0–46.0)
Hemoglobin: 7.4 g/dL — ABNORMAL LOW (ref 12.0–15.0)
MCH: 20.1 pg — ABNORMAL LOW (ref 26.0–34.0)
MCHC: 29 g/dL — ABNORMAL LOW (ref 30.0–36.0)
MCV: 69.1 fL — ABNORMAL LOW (ref 80.0–100.0)
Platelets: 352 10*3/uL (ref 150–400)
RBC: 3.69 MIL/uL — ABNORMAL LOW (ref 3.87–5.11)
RDW: 20.9 % — ABNORMAL HIGH (ref 11.5–15.5)
WBC: 10.2 10*3/uL (ref 4.0–10.5)
nRBC: 0 % (ref 0.0–0.2)

## 2021-02-13 LAB — BASIC METABOLIC PANEL
Anion gap: 7 (ref 5–15)
BUN: 10 mg/dL (ref 8–23)
CO2: 24 mmol/L (ref 22–32)
Calcium: 9 mg/dL (ref 8.9–10.3)
Chloride: 99 mmol/L (ref 98–111)
Creatinine, Ser: 0.79 mg/dL (ref 0.44–1.00)
GFR, Estimated: >60 mL/min (ref >60)
Glucose, Bld: 92 mg/dL (ref 70–99)
Potassium: 5 mmol/L (ref 3.5–5.1)
Sodium: 130 mmol/L — ABNORMAL LOW (ref 135–145)

## 2021-02-13 LAB — OSMOLALITY, URINE: Osmolality, Ur: 249 mOsm/kg — ABNORMAL LOW (ref 300–900)

## 2021-02-13 LAB — MAGNESIUM: Magnesium: 2.5 mg/dL — ABNORMAL HIGH (ref 1.7–2.4)

## 2021-02-13 MED ORDER — LOSARTAN POTASSIUM 25 MG PO TABS
25.0000 mg | ORAL_TABLET | Freq: Every day | ORAL | Status: DC
  Administered 2021-02-13 – 2021-02-15 (×3): 25 mg via ORAL
  Filled 2021-02-13 (×3): qty 1

## 2021-02-13 NOTE — Progress Notes (Addendum)
Progress Note  Patient Name: Veronica Stewart Date of Encounter: 02/13/2021  [facility] Cardiologist: Donato Heinz, MD   Subjective   Denies any chest pain, dyspnea, or palpitations  Inpatient Medications    Scheduled Meds:  amiodarone  200 mg Oral BID   atorvastatin  20 mg Oral Daily   feeding supplement  237 mL Oral TID BM   fluticasone furoate-vilanterol  1 puff Inhalation Daily   metoprolol tartrate  25 mg Oral BID   multivitamin with minerals  1 tablet Oral Daily   Continuous Infusions:  ferumoxytol 510 mg (02/11/21 1741)   PRN Meds: acetaminophen **OR** acetaminophen, albuterol, ondansetron **OR** ondansetron (ZOFRAN) IV, polyethylene glycol, sodium chloride   Vital Signs    Vitals:   02/12/21 2345 02/13/21 0403 02/13/21 0619 02/13/21 0721  BP: (!) 142/104 (!) 146/103  (!) 120/94  Pulse: 88 82  94  Resp: 14 15  18   Temp: 98.5 F (36.9 C) 98.1 F (36.7 C)  97.8 F (36.6 C)  TempSrc: Oral Oral  Oral  SpO2: 99% 99%  97%  Weight:   47.9 kg   Height:        Intake/Output Summary (Last 24 hours) at 02/13/2021 0809 Last data filed at 02/13/2021 0700 Gross per 24 hour  Intake 185.91 ml  Output 700 ml  Net -514.09 ml    Last 3 Weights 02/13/2021 02/12/2021 02/11/2021  Weight (lbs) 105 lb 9.6 oz 104 lb 8 oz 103 lb 9.6 oz  Weight (kg) 47.9 kg 47.401 kg 46.993 kg      Telemetry    Intermittent atrial flutter to 120s, currently normal sinus rhythm in 70s- Personally Reviewed  ECG    No new ECG- Personally Reviewed  Physical Exam   GEN: No acute distress.   Neck: No JVD Cardiac: RRR, no murmurs, rubs, or gallops.  Respiratory: Clear to auscultation bilaterally. GI: Soft, nontender, non-distended  MS: No edema; No deformity. Neuro:  Nonfocal  Psych: Normal affect   Labs    High Sensitivity Troponin:  No results for input(s): TROPONINIHS in the last 720 hours.    Chemistry Recent Labs  Lab 02/11/21 0938 02/12/21 0005 02/13/21 0039  NA  134* 130* 130*  K 3.9 3.4* 5.0  CL 99 98 99  CO2 26 27 24   GLUCOSE 87 107* 92  BUN 10 8 10   CREATININE 0.74 0.82 0.79  CALCIUM 9.3 9.0 9.0  PROT  --  7.0  --   ALBUMIN  --  3.0*  --   AST  --  13*  --   ALT  --  8  --   ALKPHOS  --  69  --   BILITOT  --  0.8  --   GFRNONAA >60 >60 >60  ANIONGAP 9 5 7       Hematology Recent Labs  Lab 02/11/21 0938 02/12/21 0005 02/13/21 0039  WBC 7.8 7.3 10.2  RBC 4.15 3.54* 3.69*  HGB 8.5* 7.4* 7.4*  HCT 28.7* 24.1* 25.5*  MCV 69.2* 68.1* 69.1*  MCH 20.5* 20.9* 20.1*  MCHC 29.6* 30.7 29.0*  RDW 21.7* 20.7* 20.9*  PLT 356 331 352     BNPNo results for input(s): BNP, PROBNP in the last 168 hours.   DDimer No results for input(s): DDIMER in the last 168 hours.   Radiology    ECHOCARDIOGRAM COMPLETE  Result Date: 02/12/2021    ECHOCARDIOGRAM REPORT   Patient Name:   ALBERTINA LEISE Date of Exam: 02/12/2021 Medical Rec #:  [id number]       Height:       73.0 in Accession #:    [id number]      Weight:       104.5 lb Date of Birth:  Sep 30, 1955       BSA:          1.633 m Patient Age:    65 years        BP:           149/108 mmHg Patient Gender: F               HR:           75 bpm. Exam Location:  Inpatient Procedure: 2D Echo, 3D Echo, Cardiac Doppler and Color Doppler Indications:    Atrial Flutter I48.92  History:        Patient has no prior history of Echocardiogram examinations.                 Risk Factors:Hypertension.  Sonographer:    Darlina Sicilian RDCS Referring Phys: [id number] [place]  1. Left ventricular ejection fraction, by estimation, is 40 to 45%. Left ventricular ejection fraction by 3D volume is 41 %. The left ventricle has mildly decreased function. The left ventricle demonstrates global hypokinesis. There is mild concentric left ventricular hypertrophy. Left ventricular diastolic parameters are consistent with Grade I diastolic dysfunction (impaired relaxation).  2. Abnormal RV strain ~ -18.0%. Right  ventricular systolic function is mildly reduced. The right ventricular size is normal. There is normal pulmonary artery systolic pressure. The estimated right ventricular systolic pressure is 40.8 mmHg.  3. Abnormal LA reservoir strain ~13.5%.  4. A small pericardial effusion is present. The pericardial effusion is anterior to the right ventricle. There is no evidence of cardiac tamponade.  5. The mitral valve is grossly normal. Moderate mitral valve regurgitation. No evidence of mitral stenosis.  6. Tricuspid valve regurgitation is moderate.  7. The aortic valve is tricuspid. Aortic valve regurgitation is not visualized. No aortic stenosis is present.  8. The inferior vena cava is normal in size with <50% respiratory variability, suggesting right atrial pressure of 8 mmHg. FINDINGS  Left Ventricle: Left ventricular ejection fraction, by estimation, is 40 to 45%. Left ventricular ejection fraction by 3D volume is 41 %. The left ventricle has mildly decreased function. The left ventricle demonstrates global hypokinesis. The left ventricular internal cavity size was normal in size. There is mild concentric left ventricular hypertrophy. Left ventricular diastolic parameters are consistent with Grade I diastolic dysfunction (impaired relaxation). Right Ventricle: Abnormal RV strain ~ -18.0%. The right ventricular size is normal. No increase in right ventricular wall thickness. Right ventricular systolic function is mildly reduced. There is normal pulmonary artery systolic pressure. The tricuspid regurgitant velocity is 2.60 m/s, and with an assumed right atrial pressure of 8 mmHg, the estimated right ventricular systolic pressure is 14.4 mmHg. Left Atrium: Abnormal LA reservoir strain ~13.5%. Left atrial size was normal in size. Right Atrium: Right atrial size was normal in size. Pericardium: A small pericardial effusion is present. The pericardial effusion is anterior to the right ventricle. There is no evidence of  cardiac tamponade. Mitral Valve: The mitral valve is grossly normal. Moderate mitral valve regurgitation. No evidence of mitral valve stenosis. Tricuspid Valve: The tricuspid valve is grossly normal. Tricuspid valve regurgitation is moderate . No evidence of tricuspid stenosis. Aortic Valve: The aortic valve is tricuspid. Aortic valve regurgitation is not visualized. No aortic stenosis  is present. Pulmonic Valve: The pulmonic valve was grossly normal. Pulmonic valve regurgitation is not visualized. No evidence of pulmonic stenosis. Aorta: The aortic root and ascending aorta are structurally normal, with no evidence of dilitation. Venous: The inferior vena cava is normal in size with less than 50% respiratory variability, suggesting right atrial pressure of 8 mmHg. IAS/Shunts: The atrial septum is grossly normal.  LEFT VENTRICLE PLAX 2D LVIDd:         4.50 cm         Diastology LVIDs:         3.70 cm         LV e' medial:    6.92 cm/s LV PW:         1.10 cm         LV E/e' medial:  11.1 LV IVS:        1.20 cm         LV e' lateral:   7.71 cm/s LVOT diam:     1.90 cm         LV E/e' lateral: 10.0 LV SV:         32 LV SV Index:   20              2D LVOT Area:     2.84 cm        Longitudinal                                Strain                                2D Strain GLS  -12.2 %                                Avg:                                 3D Volume EF                                LV 3D EF:    Left                                             ventricular                                             ejection                                             fraction by                                             3D volume  is 41 %.                                 3D Volume EF:                                3D EF:        41 % RIGHT VENTRICLE RV S prime:     7.96 cm/s TAPSE (M-mode): 1.7 cm LEFT ATRIUM             Index       RIGHT ATRIUM           Index LA diam:        2.80  cm 1.71 cm/m  RA Area:     11.70 cm LA Vol (A2C):   52.8 ml 32.34 ml/m RA Volume:   26.90 ml  16.47 ml/m LA Vol (A4C):   50.2 ml 30.74 ml/m LA Biplane Vol: 53.3 ml 32.64 ml/m  AORTIC VALVE LVOT Vmax:   60.67 cm/s LVOT Vmean:  39.533 cm/s LVOT VTI:    0.113 m  AORTA Ao Root diam: 3.00 cm Ao Asc diam:  3.40 cm MITRAL VALVE                 TRICUSPID VALVE MV Area (PHT): 4.79 cm      TR Peak grad:   27.0 mmHg MV Decel Time: 159 msec      TR Vmax:        260.00 cm/s MR Peak grad:    117.1 mmHg MR Mean grad:    68.0 mmHg   SHUNTS MR Vmax:         541.00 cm/s Systemic VTI:  0.11 m MR Vmean:        388.0 cm/s  Systemic Diam: 1.90 cm MR PISA:         2.26 cm MR PISA Eff ROA: 16 mm MR PISA Radius:  0.60 cm MV E velocity: 76.85 cm/s MV A velocity: 61.15 cm/s MV E/A ratio:  1.26 Eleonore Chiquito MD Electronically signed by Eleonore Chiquito MD Signature Date/Time: 02/12/2021/12:57:58 PM    Final     Cardiac Studies     Patient Profile     65 y.o. female with a hx of PAD s/p fem-pop bypass, HTN, asthma who is being seen 02/11/2021 for the evaluation of new onset aflutter   Assessment & Plan    New onset Atrial Flutter RVR: Initially in the 140s on admission. Has had intermittent episodes throughout admission. CHADSVASc score 4 (CHF, HTN, PAD, female).  Echo shows EF 40 to 45% -- continue metoprolol 25 mg BID, can consolidate to toprol XL prior to discharge -- Given she has been going in and out of atrial flutter, started amiodarone to maintain sinus rhythm.  No atrial flutter over last 24 hours.  Would continue amiodarone 200 mg twice daily x2 weeks, then decrease to 200 mg daily x2 weeks, then discontinue.  Will not plan on long-term amiodarone, but would continue as she recovers from her acute illness -- Not a good candidate for anticoagulation right now given her epistaxis and acute blood loss anemia.  Would recommend Eliquis once OK per ENT  Acute combined systolic and diastolic heart failure: Echo 02/13/2020  shows EF 40 to 45%.  New diagnosis heart failure.   -Given her PAD history, high suspicion for obstructive CAD,  though she denies any issues with chest pain.  She will need ischemia evaluation at some point, likely as an outpatient once issues with anemia/epistaxis have resolved -Continue metoprolol, plan to consolidate to Toprol-XL prior to discharge -Potassium 5.0 today, will hold off on Entresto at this time but start losartan 25 mg daily.  Plan to transition to [facility] once able -Appears euvolemic   Epistaxis: reports long history of freq nose bleeds. Has had to have packing placed now with two separate episodes. ENT consulted in the ED with packing placed to right the right nare. -- packing to be removed Saturday   HTN: on metoprolol 25mg  BID as above.  BP has been elevated, will add losartan   PAD s/p fem-pop bypass: with left external iliac stenting 8/21. Has been on ASA/plavix since that time. Would plan to discontinue plavix and likely Eliquis monotherapy given atrial flutter as above (once able to start anticoagulation per ENT).  If not tolerating Eliquis, would favor just aspirin 81 mg daily   Anemia: in the setting of acute blood loss from nose bleed. Hgb 8.5 on admission, down to 7.4 today --ASA/plavix held on admission --ENT following for epistaxis   Asthma: PRN albuterol   For questions or updates, please contact [place] Please consult [email or web address] for contact info under        Signed, Donato Heinz, MD  02/13/2021, 8:09 AM

## 2021-02-13 NOTE — Plan of Care (Signed)
  Problem: Education: Goal: Knowledge of General Education information will improve Description: Including pain rating scale, medication(s)/side effects and non-pharmacologic comfort measures Outcome: Progressing   Problem: Health Behavior/Discharge Planning: Goal: Ability to manage health-related needs will improve Outcome: Progressing   Problem: Clinical Measurements: Goal: Ability to maintain clinical measurements within normal limits will improve Outcome: Progressing Goal: Will remain free from infection Outcome: Progressing Goal: Respiratory complications will improve Outcome: Progressing   Problem: Nutrition: Goal: Adequate nutrition will be maintained Outcome: Progressing   Problem: Coping: Goal: Level of anxiety will decrease Outcome: Progressing   Problem: Elimination: Goal: Will not experience complications related to bowel motility Outcome: Progressing   Problem: Pain Managment: Goal: General experience of comfort will improve Outcome: Progressing

## 2021-02-13 NOTE — Progress Notes (Addendum)
Subjective:  Ms. Ditton was evaluated and examined at her bedside this morning. She reports feeling well overall, and she denies fatigue, dyspnea, melena, vaginal bleeding, hematuria. She states she has never been told she has had issues with her iron levels before. She has been able to get up and walk around with her cane without difficulty. She was told her iron levels were low and there would need to be further evaluation to discover the underlying cause. She is agreeable to the plan of removing her nasal packing tomorrow and getting her home if there are no complications.   She reports that someone spoke to her about the results of her echocardiogram yesterday but she cannot remember what was said. The results of her echo were explained and she was told the cause of her structural changes would need to be evaluated further. She denied any feelings of palpitations and is agreeable to the plan.  Objective:  Vital signs in last 24 hours: Vitals:   02/13/21 0619 02/13/21 0721 02/13/21 0811 02/13/21 1118  BP:  (!) 120/94  110/77  Pulse:  94  78  Resp:  18  13  Temp:  97.8 F (36.6 C)  97.8 F (36.6 C)  TempSrc:  Oral  Oral  SpO2:  97% 98% 98%  Weight: 47.9 kg     Height:       Weight change: -0.1 kg  Intake/Output Summary (Last 24 hours) at 02/13/2021 1129 Last data filed at 02/13/2021 1036 Gross per 24 hour  Intake 185.91 ml  Output 1000 ml  Net -814.09 ml    Constitutional: well-appearing and sitting in bed comfortably, in no acute distress HENT: normocephalic atraumatic Eyes: conjunctiva non-erythematous Neck: supple Cardiovascular: regular rate and rhythm, no m/r/g Pulmonary/Chest: normal work of breathing on room air MSK: normal bulk and tone Ext: no edema Neurological: alert & oriented x 3 Skin: warm and dry Psych: appropriate mood and affect  Assessment/Plan:  Principal Problem:   Uncontrolled atrial flutter (HCC) Active Problems:   Hypertension   Iron  deficiency anemia   PAD (peripheral artery disease) (HCC)   Epistaxis  Veronica Stewart is a 65 y.o. person on hospital day 3 with medical history significant for PAD s/p left femoral/popliteal bypass, stent placement, HTN, on dual antiplatelet therapy, who presented to the emergency department with epistaxis and was admitted for management of atrial flutter.  Epistaxis Nasal packing remains in place and ENT is managing. Dual anti-platelet therapy has likely contributed to recurrent and persistent nose bleeds.  -Continue to hold ASA/plavix -Will remove nasal packing tomorrow per ENT  Iron Deficiency Anemia Lab work at this time is indicative of iron deficiency anemia with decreased Hgb, iron, ferritin, and increased TIBC. This lends itself towards an extended chronicity as opposed to acute blood loss from recent two episodes of epistaxis. Chronic nature of iron deficiency with no obvious source of bleeding at this time will be most appropriately managed with outpatient follow-up.  -Received Feraheme 510mg  on 6/29. Iron deficit is around 1300mg . Can dose second Feraheme 510mg  on 7/2.  -Continues to be asymptomatic and Hgb > 7.0 so no transfusion indicated at this time -Outpatient follow up to work up iron deficiency anemia, may benefit from colonoscopy to rule out malignancy if other comorbidities are controlled  Atrial Flutter, now in Sinus Rhythm Intermittent episodes throughout duration of admission. Cardiology is following. She has a CHADSVASc score of 4 and an ejection fraction of 40-45%. -Will respect recommendations per cardiology:  -continue  metoprolol 25 mg BID and consolidate to to toprol XL before discharge  -continue amiodarone 200 mg BID x 2 weeks, then d/c  -Eliquis monotherapy for anticoagulation once okay per ENT -Will discuss Eliquis monotherapy vs. ASA alone with ENT  New Heart Failure with mildly Reduced EF Cardiology is managing and echo revels new diagnosis heart failure  with EF to 40-45%. Pt is euvolemic today and does not require decongestion.  -Given hx of PAD, outpatient ischemic evaluation indicated and will require scheduled close follow up -continue metoprolol 25 mg BID and consolidate to toprol XL before discharge -Start losartan 25 mg daily  Hyperkalemia Pt with potassium of 3.4 and magnesium of 1.9 yesterday. Two doses of Klorcon 40 mEq given yesterday which have increased potassium to 5.0 and magnesium to 2.5. -No additional Klorcon indicated  Hyponatremia Nutrition is following and patient was changed from heart healthy diet to normal to provide more protein and nutrition. Urine osmolality somewhat dilute at 249. Sodium likely to improve with more solute.  -Free water restriction if sodium worsens  DVT Prophx:SCD Diet: Normal Bowel: PRN miralax Code: Full   LOS: 1 day   Archer Asa, Medical Student 02/13/2021, 11:29 AM

## 2021-02-14 LAB — BASIC METABOLIC PANEL
Anion gap: 8 (ref 5–15)
BUN: 11 mg/dL (ref 8–23)
CO2: 24 mmol/L (ref 22–32)
Calcium: 9.1 mg/dL (ref 8.9–10.3)
Chloride: 97 mmol/L — ABNORMAL LOW (ref 98–111)
Creatinine, Ser: 0.89 mg/dL (ref 0.44–1.00)
GFR, Estimated: >60 mL/min (ref >60)
Glucose, Bld: 94 mg/dL (ref 70–99)
Potassium: 4.6 mmol/L (ref 3.5–5.1)
Sodium: 129 mmol/L — ABNORMAL LOW (ref 135–145)

## 2021-02-14 LAB — CBC
HCT: 24.2 % — ABNORMAL LOW (ref 36.0–46.0)
Hemoglobin: 7.1 g/dL — ABNORMAL LOW (ref 12.0–15.0)
MCH: 20.5 pg — ABNORMAL LOW (ref 26.0–34.0)
MCHC: 29.3 g/dL — ABNORMAL LOW (ref 30.0–36.0)
MCV: 69.7 fL — ABNORMAL LOW (ref 80.0–100.0)
Platelets: 360 10*3/uL (ref 150–400)
RBC: 3.47 MIL/uL — ABNORMAL LOW (ref 3.87–5.11)
RDW: 21.5 % — ABNORMAL HIGH (ref 11.5–15.5)
WBC: 11.8 10*3/uL — ABNORMAL HIGH (ref 4.0–10.5)
nRBC: 0.2 % (ref 0.0–0.2)

## 2021-02-14 LAB — MAGNESIUM: Magnesium: 2 mg/dL (ref 1.7–2.4)

## 2021-02-14 MED ORDER — OXYMETAZOLINE HCL 0.05 % NA SOLN
1.0000 | Freq: Two times a day (BID) | NASAL | Status: AC
  Administered 2021-02-14 – 2021-02-16 (×6): 1 via NASAL
  Filled 2021-02-14: qty 30

## 2021-02-14 NOTE — Progress Notes (Signed)
Subjective: No bleeding with pack in place.  Objective: Vital signs in last 24 hours: Temp:  [97.8 F (36.6 C)-99.1 F (37.3 C)] 98.5 F (36.9 C) (07/02 0726) Pulse Rate:  [77-96] 88 (07/02 0820) Resp:  [11-16] 16 (07/02 0820) BP: (110-141)/(77-99) 125/84 (07/02 0726) SpO2:  [95 %-99 %] 97 % (07/02 0726) Weight:  [48.4 kg] 48.4 kg (07/02 0400) Wt Readings from Last 1 Encounters:  02/14/21 48.4 kg    Intake/Output from previous day: 07/01 0701 - 07/02 0700 In: 674 [P.O.:674] Out: 300 [Urine:300] Intake/Output this shift: No intake/output data recorded.  General appearance: alert, cooperative, and no distress Nose: right nasal pack removed, minor bleeding after, drip pad added  Recent Labs    02/13/21 0039 02/14/21 0016  WBC 10.2 11.8*  HGB 7.4* 7.1*  HCT 25.5* 24.2*  PLT 352 360    Recent Labs    02/13/21 0039 02/14/21 0016  NA 130* 129*  K 5.0 4.6  CL 99 97*  CO2 24 24  GLUCOSE 92 94  BUN 10 11  CREATININE 0.79 0.89  CALCIUM 9.0 9.1    Medications: I have reviewed the patient's current medications.  Assessment/Plan: Right epistaxis, anti-platelet therapy  Right pack removed, minor bleeding resulting.  Afrin ordered to bedside to use if bleeding worsens.  Instructed nurse to change drip pad as needed until bleeding stops.  Delay resuming anti-platelet or anti-coagulation 1-2 days after bleeding has fully stopped.   LOS: 2 days   Melida Quitter 02/14/2021, 9:12 AM

## 2021-02-14 NOTE — Progress Notes (Signed)
Progress Note  Patient Name: Veronica Stewart Date of Encounter: 02/14/2021  [facility] Cardiologist: Donato Heinz, MD   Subjective   Feels well today- nose is still packed. She converted to sinus rhythm.  Inpatient Medications    Scheduled Meds:  amiodarone  200 mg Oral BID   atorvastatin  20 mg Oral Daily   feeding supplement  237 mL Oral TID BM   fluticasone furoate-vilanterol  1 puff Inhalation Daily   losartan  25 mg Oral Daily   metoprolol tartrate  25 mg Oral BID   multivitamin with minerals  1 tablet Oral Daily   oxymetazoline  1 spray Each Nare BID   Continuous Infusions:  ferumoxytol 510 mg (02/11/21 1741)   PRN Meds: acetaminophen **OR** acetaminophen, albuterol, ondansetron **OR** ondansetron (ZOFRAN) IV, polyethylene glycol, sodium chloride   Vital Signs    Vitals:   02/14/21 0400 02/14/21 0726 02/14/21 0820 02/14/21 1113  BP: (!) 141/94 125/84  117/79  Pulse: 78 82 88 83  Resp: 16 14 16 15   Temp:  98.5 F (36.9 C)  98.8 F (37.1 C)  TempSrc: Oral Oral  Oral  SpO2: 99% 97%  95%  Weight: 48.4 kg     Height:        Intake/Output Summary (Last 24 hours) at 02/14/2021 1130 Last data filed at 02/13/2021 1600 Gross per 24 hour  Intake 437 ml  Output --  Net 437 ml   Last 3 Weights 02/14/2021 02/13/2021 02/12/2021  Weight (lbs) 106 lb 11.2 oz 105 lb 9.6 oz 104 lb 8 oz  Weight (kg) 48.4 kg 47.9 kg 47.401 kg      Telemetry    Currently normal sinus rhythm in 70s- Personally Reviewed  ECG    No new ECG- Personally Reviewed  Physical Exam   GEN: No acute distress.   Neck: No JVD Cardiac: RRR, no murmurs, rubs, or gallops.  Respiratory: Clear to auscultation bilaterally. GI: Soft, nontender, non-distended  MS: No edema; No deformity. Neuro:  Nonfocal  Psych: Normal affect   Labs    High Sensitivity Troponin:  No results for input(s): TROPONINIHS in the last 720 hours.    Chemistry Recent Labs  Lab 02/12/21 0005 02/13/21 0039  02/14/21 0016  NA 130* 130* 129*  K 3.4* 5.0 4.6  CL 98 99 97*  CO2 27 24 24   GLUCOSE 107* 92 94  BUN 8 10 11   CREATININE 0.82 0.79 0.89  CALCIUM 9.0 9.0 9.1  PROT 7.0  --   --   ALBUMIN 3.0*  --   --   AST 13*  --   --   ALT 8  --   --   ALKPHOS 69  --   --   BILITOT 0.8  --   --   GFRNONAA >60 >60 >60  ANIONGAP 5 7 8      Hematology Recent Labs  Lab 02/12/21 0005 02/13/21 0039 02/14/21 0016  WBC 7.3 10.2 11.8*  RBC 3.54* 3.69* 3.47*  HGB 7.4* 7.4* 7.1*  HCT 24.1* 25.5* 24.2*  MCV 68.1* 69.1* 69.7*  MCH 20.9* 20.1* 20.5*  MCHC 30.7 29.0* 29.3*  RDW 20.7* 20.9* 21.5*  PLT 331 352 360    BNPNo results for input(s): BNP, PROBNP in the last 168 hours.   DDimer No results for input(s): DDIMER in the last 168 hours.   Radiology    No results found.  Cardiac Studies     Patient Profile     65  y.o. female with a hx of PAD s/p fem-pop bypass, HTN, asthma who is being seen 02/11/2021 for the evaluation of new onset aflutter   Assessment & Plan    New onset Atrial Flutter RVR: Initially in the 140s on admission. Has had intermittent episodes throughout admission. CHADSVASc score 4 (CHF, HTN, PAD, female).  Echo shows EF 40 to 45% -- continue metoprolol 25 mg BID, can consolidate to toprol XL prior to discharge -- Given she has been going in and out of atrial flutter, started amiodarone to maintain sinus rhythm.  No atrial flutter over last 24 hours.  Would continue amiodarone 200 mg twice daily x2 weeks, then decrease to 200 mg daily x2 weeks, then discontinue.  Will not plan on long-term amiodarone, but would continue as she recovers from her acute illness -- Not a good candidate for anticoagulation right now given her epistaxis and acute blood loss anemia.  Would recommend Eliquis once OK per ENT which appears to be in 1-2 days after bleeding has fully stopped.  Acute combined systolic and diastolic heart failure: Echo 02/13/2020 shows EF 40 to 45%.  New diagnosis  heart failure.   -Given her PAD history, high suspicion for obstructive CAD, though she denies any issues with chest pain.  She will need ischemia evaluation at some point, likely as an outpatient once issues with anemia/epistaxis have resolved -Continue metoprolol, plan to consolidate to Toprol-XL prior to discharge -Potassium 5.0 today, will hold off on Entresto at this time but start losartan 25 mg daily.  Plan to transition to [facility] once able -Appears euvolemic   Epistaxis: reports long history of freq nose bleeds. Has had to have packing placed now with two separate episodes. ENT consulted in the ED with packing placed to right the right nare. -- packing to be removed Saturday   HTN: on metoprolol 25mg  BID as above.  BP has been elevated, will add losartan   PAD s/p fem-pop bypass: with left external iliac stenting 8/21. Has been on ASA/plavix since that time. Would plan to discontinue plavix and likely Eliquis monotherapy given atrial flutter as above (once able to start anticoagulation per ENT).  If not tolerating Eliquis, would favor just aspirin 81 mg daily   Anemia: in the setting of acute blood loss from nose bleed. Hgb 8.5 on admission, down to 7.4 today --ASA/plavix held on admission --ENT following for epistaxis   Asthma: PRN albuterol   For questions or updates, please contact [place] Please consult [email or web address] for contact info under   Pixie Casino, MD, FACC, [place] Director of the [facility] Diplomate of the American Board of Clinical Lipidology Attending Cardiologist  Direct Dial: [phone]  Fax: [phone]  Website:  [email or web address]  Pixie Casino, MD  02/14/2021, 11:30 AM

## 2021-02-14 NOTE — Plan of Care (Signed)
  Problem: Education: Goal: Knowledge of General Education information will improve Description: Including pain rating scale, medication(s)/side effects and non-pharmacologic comfort measures Outcome: Progressing   Problem: Health Behavior/Discharge Planning: Goal: Ability to manage health-related needs will improve Outcome: Progressing   Problem: Clinical Measurements: Goal: Ability to maintain clinical measurements within normal limits will improve Outcome: Progressing   Problem: Clinical Measurements: Goal: Diagnostic test results will improve Outcome: Progressing   Problem: Activity: Goal: Risk for activity intolerance will decrease Outcome: Progressing   Problem: Nutrition: Goal: Adequate nutrition will be maintained Outcome: Progressing   Problem: Pain Managment: Goal: General experience of comfort will improve Outcome: Progressing   Problem: Safety: Goal: Ability to remain free from injury will improve Outcome: Progressing

## 2021-02-14 NOTE — Progress Notes (Addendum)
[facility]  Resident Daily Progress Note  Subjective:  Veronica Stewart was evaluated and examined at her bedside this morning. She has no acute complaints, and states she is feeling well. She reports no fatigue, lightheadedness or dizziness, or palpitations. She has been able to walk back and forth from the bathroom without difficulty.  She reports she had some slight bleeding from her nose after her nasal packing was removed this morning, but she denies pain or significant bleeding in her nose. We discussed the plan of watching to see if her nose begins to bleed again while determining the best medicine regimen to send her home on, and she was agreeable to the plan. She states she would prefer to remain here one additional night to ensure the bleeding does not resume.  Follow up with cardiology for further evaluation of her structural heart disease was discussed, and she was agreeable.  Objective:  Vital signs in last 24 hours: Vitals:   02/14/21 0341 02/14/21 0400 02/14/21 0726 02/14/21 0820  BP: (!) 134/94 (!) 141/94 125/84   Pulse: 77 78 82 88  Resp: 15 16 14 16   Temp: 98.6 F (37 C)  98.5 F (36.9 C)   TempSrc: Oral Oral Oral   SpO2: 97% 99% 97%   Weight:  48.4 kg    Height:       Weight change: 0.5 kg  Intake/Output Summary (Last 24 hours) at 02/14/2021 1008 Last data filed at 02/13/2021 1600 Gross per 24 hour  Intake 674 ml  Output 300 ml  Net 374 ml    Constitutional: well-appearing and sitting in bed comfortably, in no acute distress HENT: normocephalic atraumatic Eyes: conjunctiva non-erythematous Neck: supple Cardiovascular: regular rate and rhythm, no m/r/g Pulmonary/Chest: normal work of breathing on room air MSK: normal bulk and tone Neurological: alert & oriented x 3 Skin: warm and dry Psych: appropriate mood and affect  Assessment/Plan:  Principal Problem:   Uncontrolled atrial flutter (HCC) Active Problems:   Hypertension    Iron deficiency anemia   PAD (peripheral artery disease) (HCC)   Epistaxis  Veronica Stewart is a 65 y.o. person on hospital day 3 with medical history significant for PAD s/p left femoral/popliteal bypass, stent placement, HTN, on dual antiplatelet therapy, who presented to the emergency department with epistaxis and was admitted for management of atrial flutter.  Atrial Flutter, now in Sinus Rhythm Pt has experienced intermittent episodes of moving in an out of sinus rhythm throughout admission, but has been stable in sinus rhythm since yesterday. Current medication regimen seems to be appropriately controlling her arrhythmia. -Continue metoprolol 25 mg BID and consolidate to toprol XL before discharge -Continue amiodarone 200 mg BID for 2 weeks, then d/c. -Discussed Eliquis monotherapy vs. ASA alone with ENT and will plan to start pt on Eliquis for anticoagulation to mitigate stroke risk.  Epistaxis ENT is managing and has removed nasal packing this morning. Pt with no significant bleeding since packing removal and appears stable. -Will respect recommendations per ENT  -Continue bedside prn Afrin  -Resume anti-coagulation after bleeding has completely stopped for 1-2 days.  Iron Deficiency Anemia Pt remains asymptomatic and stable but Hgb down today to 7.1. Appears to be chronic in nature given her low iron and increased TIBC, along with no obvious source of bleeding.  -Asymptomatic and Hgb remains > 7.0 so no transfusion indicated at this time. -Will give second dose of 510mg  Feraheme today as iron deficit is around 1000 mg. -Close outpatient  follow up will be required to work up iron deficiency. Pt may benefit from colonoscopy to rule out malignant pathology if current comorbidities are otherwise controlled.  New Heart Failure with mildly Reduced EF Cardiology is managing and patient remains euvolemic and stable today. No need for decongestion. -Continue metoprolol 25 mg BID and  consolidate to toprol XL before discharge -Continue losartan 25 mg daily -Close outpatient follow up will be required for evaluation of ischemic or hypertensive etiologies of structural heart disease  Hyperkalemia Potassium has declined to 4.6 since yesterday and magnesium is 2.0. -No additional Klorcon indicated  Hyponatremia Sodium at 129 today despite dietary intervention. Patient is asymptomatic and hyponatremia is mild. -Will restrict free water to see if sodium improves -If sodium fails to improve, will check urine sodium  DVT Prophx:SCD Diet: Normal Bowel: PRN miralax Code: Full   LOS: 2 days   Archer Asa, Medical Student 02/14/2021, 10:08 AM   Attestation for Student Documentation:  I personally was present and performed or re-performed the history, physical exam and medical decision-making activities of this service and have verified that the service and findings are accurately documented in the student's note.  Maudie Mercury, MD 02/14/2021, 12:12 PM

## 2021-02-15 DIAGNOSIS — I5043 Acute on chronic combined systolic (congestive) and diastolic (congestive) heart failure: Secondary | ICD-10-CM

## 2021-02-15 DIAGNOSIS — D62 Acute posthemorrhagic anemia: Secondary | ICD-10-CM

## 2021-02-15 DIAGNOSIS — I480 Paroxysmal atrial fibrillation: Secondary | ICD-10-CM

## 2021-02-15 LAB — CBC
HCT: 25.9 % — ABNORMAL LOW (ref 36.0–46.0)
Hemoglobin: 7.6 g/dL — ABNORMAL LOW (ref 12.0–15.0)
MCH: 20.6 pg — ABNORMAL LOW (ref 26.0–34.0)
MCHC: 29.3 g/dL — ABNORMAL LOW (ref 30.0–36.0)
MCV: 70.2 fL — ABNORMAL LOW (ref 80.0–100.0)
Platelets: 366 10*3/uL (ref 150–400)
RBC: 3.69 MIL/uL — ABNORMAL LOW (ref 3.87–5.11)
RDW: 22.7 % — ABNORMAL HIGH (ref 11.5–15.5)
WBC: 8.6 10*3/uL (ref 4.0–10.5)
nRBC: 0 % (ref 0.0–0.2)

## 2021-02-15 LAB — BASIC METABOLIC PANEL
Anion gap: 10 (ref 5–15)
BUN: 9 mg/dL (ref 8–23)
CO2: 25 mmol/L (ref 22–32)
Calcium: 9.3 mg/dL (ref 8.9–10.3)
Chloride: 98 mmol/L (ref 98–111)
Creatinine, Ser: 0.78 mg/dL (ref 0.44–1.00)
GFR, Estimated: >60 mL/min (ref >60)
Glucose, Bld: 78 mg/dL (ref 70–99)
Potassium: 4.5 mmol/L (ref 3.5–5.1)
Sodium: 133 mmol/L — ABNORMAL LOW (ref 135–145)

## 2021-02-15 MED ORDER — LOSARTAN POTASSIUM 25 MG PO TABS
25.0000 mg | ORAL_TABLET | Freq: Once | ORAL | Status: DC
  Filled 2021-02-15: qty 1

## 2021-02-15 MED ORDER — LOSARTAN POTASSIUM 50 MG PO TABS
50.0000 mg | ORAL_TABLET | Freq: Every day | ORAL | Status: DC
  Administered 2021-02-16 – 2021-02-17 (×2): 50 mg via ORAL
  Filled 2021-02-15 (×2): qty 1

## 2021-02-15 NOTE — Plan of Care (Signed)

## 2021-02-15 NOTE — Progress Notes (Signed)
HD#3 Subjective:  Overnight Events: no overnight events   Patient seen and evaluated at bedside. She reports that she has been doing well. No recurrent bleeding that she has noticed. Discussed with her plan to restart eliquis tomorrow and monitor for signs of bleeding for 1-2 days with discharge afterwards.  Objective:  Vital signs in last 24 hours: Vitals:   02/14/21 2325 02/15/21 0401 02/15/21 0745 02/15/21 0821  BP: (!) 141/91 (!) 149/92 (!) 145/96   Pulse: 68 78 76 76  Resp: 12 20 13 16   Temp: 98.5 F (36.9 C) 98.2 F (36.8 C) 98 F (36.7 C)   TempSrc: Oral Oral Oral   SpO2: 99% 96% 96% 96%  Weight:  47.4 kg    Height:       Supplemental O2: Room Air SpO2: 96 %   Physical Exam:  Constitutional: well-appearing, sitting in bed, in no acute distress HENT: normocephalic atraumatic, mucous membranes moist. Blood tinged mucus draining from right nostril.no signs of active bleed. Eyes: conjunctiva non-erythematous Neck: supple Cardiovascular: regular rate and rhythm, no m/r/g Pulmonary/Chest: normal work of breathing on room air, lungs clear to auscultation bilaterally Abdominal: soft, non-tender, non-distended MSK: normal bulk and tone Neurological: alert & oriented x 3, 5/5 strength in bilateral upper and lower extremities, normal gait Skin: warm and dry Psych: normal mood and mentation  Filed Weights   02/13/21 0619 02/14/21 0400 02/15/21 0401  Weight: 47.9 kg 48.4 kg 47.4 kg     Intake/Output Summary (Last 24 hours) at 02/15/2021 0946 Last data filed at 02/15/2021 0800 Gross per 24 hour  Intake 720 ml  Output 600 ml  Net 120 ml   Net IO Since Admission: 219.91 mL [02/15/21 0946]  Pertinent Labs: CBC Latest Ref Rng & Units 02/15/2021 02/14/2021 02/13/2021  WBC 4.0 - 10.5 K/uL 8.6 11.8(H) 10.2  Hemoglobin 12.0 - 15.0 g/dL 7.6(L) 7.1(L) 7.4(L)  Hematocrit 36.0 - 46.0 % 25.9(L) 24.2(L) 25.5(L)  Platelets 150 - 400 K/uL 366 360 352    CMP Latest Ref Rng & Units  02/15/2021 02/14/2021 02/13/2021  Glucose 70 - 99 mg/dL 78 94 92  BUN 8 - 23 mg/dL 9 11 10   Creatinine 0.44 - 1.00 mg/dL 0.78 0.89 0.79  Sodium 135 - 145 mmol/L 133(L) 129(L) 130(L)  Potassium 3.5 - 5.1 mmol/L 4.5 4.6 5.0  Chloride 98 - 111 mmol/L 98 97(L) 99  CO2 22 - 32 mmol/L 25 24 24   Calcium 8.9 - 10.3 mg/dL 9.3 9.1 9.0  Total Protein 6.5 - 8.1 g/dL - - -  Total Bilirubin 0.3 - 1.2 mg/dL - - -  Alkaline Phos 38 - 126 U/L - - -  AST 15 - 41 U/L - - -  ALT 0 - 44 U/L - - -    Imaging: No results found.  Assessment/Plan:   Principal Problem:   Uncontrolled atrial flutter (HCC) Active Problems:   Hypertension   Iron deficiency anemia   PAD (peripheral artery disease) ([place])   Epistaxis   Patient Summary: Veronica Stewart is a 65 y.o. with a pertinent PMH of PAD s/p left fem/pop bypass, stent placement, HTN, on dual antiplatelet therapy, who presented with severe epistaxis and admitted for atrial flutter.    # Atrial Flutter - intermittent episodes of afl throughout admission. CHADSVACs 4. ECHO shows EF 40 - continue metoprolol 25 mg BID, can consolidate to toprol XL prior to discharge - going in and out of flutter. Maintaining sinus rhythm on amiodarone. No flutter  last 24 hrs. Will continue amiodarone 200mg  BID for two weeks then decrease to 200mg  daily for 2 weeks then discontinue. Longterm amiodarone not recommenced.  - appreciate cardiology recs - will restart Eliquis tomorrow  # acute combined HF with reduced EF This is a new diagnosis. Euvolemic at this time.  - ECHO 02/13/20 EF 36 - high suspicion for obstructive  CAD, but denies CP. Needs ischemic evaluation as outpatient once safe for discharge - continue metoprolol and switch to toprol XL prior to discharge - potassium 5.0. holding entresto but losartan started at 25mg  daily. Will transition to entresto as outpatient.   # Epistaxis Packing removed by ENT 02/14/2021. No recurrence of bleeding. Will continue to  monitor.  # Iron Deficiency anemia Stable. Follow up as outpatient  #HTN - continue metoprolol 25 BID. Losartan increased to 50mg .  # Hyperkalemia Stable monitor  # Hyponatremia - stable monitor  Diet: Normal IVF: None,None VTE: SCDs Code: Full    Dispo: Anticipated discharge to Home in 2 days pending no recurrent bleeding.   Delene Ruffini, MD Internal Medicine Resident PGY-1 Pager [phone] Please contact the on call pager after 5 pm and on weekends at [phone].

## 2021-02-15 NOTE — Progress Notes (Signed)
Progress Note  Patient Name: Veronica Stewart Date of Encounter: 02/15/2021  [facility] Cardiologist: Donato Heinz, MD   Subjective   Feels well today- nose is still packed. She converted to sinus rhythm.  Inpatient Medications    Scheduled Meds:  amiodarone  200 mg Oral BID   atorvastatin  20 mg Oral Daily   feeding supplement  237 mL Oral TID BM   fluticasone furoate-vilanterol  1 puff Inhalation Daily   losartan  25 mg Oral Daily   metoprolol tartrate  25 mg Oral BID   multivitamin with minerals  1 tablet Oral Daily   oxymetazoline  1 spray Each Nare BID   Continuous Infusions:  ferumoxytol 510 mg (02/11/21 1741)   PRN Meds: acetaminophen **OR** acetaminophen, albuterol, ondansetron **OR** ondansetron (ZOFRAN) IV, polyethylene glycol, sodium chloride   Vital Signs    Vitals:   02/15/21 0401 02/15/21 0745 02/15/21 0821 02/15/21 1046  BP: (!) 149/92 (!) 145/96  (!) 137/92  Pulse: 78 76 76 74  Resp: 20 13 16 15   Temp: 98.2 F (36.8 C) 98 F (36.7 C)  98.1 F (36.7 C)  TempSrc: Oral Oral  Oral  SpO2: 96% 96% 96% 99%  Weight: 47.4 kg     Height:        Intake/Output Summary (Last 24 hours) at 02/15/2021 1213 Last data filed at 02/15/2021 0800 Gross per 24 hour  Intake 480 ml  Output 300 ml  Net 180 ml   Last 3 Weights 02/15/2021 02/14/2021 02/13/2021  Weight (lbs) 104 lb 8 oz 106 lb 11.2 oz 105 lb 9.6 oz  Weight (kg) 47.4 kg 48.4 kg 47.9 kg      Telemetry    Currently normal sinus rhythm in 70s- Personally Reviewed  ECG    No new ECG- Personally Reviewed  Physical Exam   GEN: No acute distress.   Neck: No JVD Cardiac: RRR, no murmurs, rubs, or gallops.  Respiratory: Clear to auscultation bilaterally. GI: Soft, nontender, non-distended  MS: No edema; No deformity. Neuro:  Nonfocal  Psych: Normal affect   Labs    High Sensitivity Troponin:  No results for input(s): TROPONINIHS in the last 720 hours.    Chemistry Recent Labs  Lab  02/12/21 0005 02/13/21 0039 02/14/21 0016 02/15/21 0208  NA 130* 130* 129* 133*  K 3.4* 5.0 4.6 4.5  CL 98 99 97* 98  CO2 27 24 24 25   GLUCOSE 107* 92 94 78  BUN 8 10 11 9   CREATININE 0.82 0.79 0.89 0.78  CALCIUM 9.0 9.0 9.1 9.3  PROT 7.0  --   --   --   ALBUMIN 3.0*  --   --   --   AST 13*  --   --   --   ALT 8  --   --   --   ALKPHOS 69  --   --   --   BILITOT 0.8  --   --   --   GFRNONAA >60 >60 >60 >60  ANIONGAP 5 7 8 10      Hematology Recent Labs  Lab 02/13/21 0039 02/14/21 0016 02/15/21 0208  WBC 10.2 11.8* 8.6  RBC 3.69* 3.47* 3.69*  HGB 7.4* 7.1* 7.6*  HCT 25.5* 24.2* 25.9*  MCV 69.1* 69.7* 70.2*  MCH 20.1* 20.5* 20.6*  MCHC 29.0* 29.3* 29.3*  RDW 20.9* 21.5* 22.7*  PLT 352 360 366    BNPNo results for input(s): BNP, PROBNP in the last 168 hours.  DDimer No results for input(s): DDIMER in the last 168 hours.   Radiology    No results found.  Cardiac Studies   None  Patient Profile     65 y.o. female with a hx of PAD s/p fem-pop bypass, HTN, asthma who is being seen 02/11/2021 for the evaluation of new onset aflutter   Assessment & Plan    New onset Atrial Flutter RVR: Initially in the 140s on admission. Has had intermittent episodes throughout admission. CHADSVASc score 4 (CHF, HTN, PAD, female).  Echo shows EF 40 to 45% -- continue metoprolol 25 mg BID, can consolidate to toprol XL prior to discharge -- Given she has been going in and out of atrial flutter, started amiodarone to maintain sinus rhythm.  No atrial flutter over last 24 hours.  Would continue amiodarone 200 mg twice daily x2 weeks, then decrease to 200 mg daily x2 weeks, then discontinue.  Will not plan on long-term amiodarone, but would continue as she recovers from her acute illness -- Per ENT - would be ok to start anticoagulation 1-2 days after bleeding has stopped  Acute combined systolic and diastolic heart failure: Echo 02/13/2020 shows EF 40 to 45%.  New diagnosis heart  failure.   -Given her PAD history, high suspicion for obstructive CAD, though she denies any issues with chest pain.  She will need ischemia evaluation at some point, likely as an outpatient once issues with anemia/epistaxis have resolved -Continue metoprolol, plan to consolidate to Toprol-XL prior to discharge -Potassium 5.0 today, will hold off on Entresto at this time but start losartan 25 mg daily.  Plan to transition to [facility] as outpatient -Appears euvolemic   Epistaxis: reports long history of freq nose bleeds. Has had to have packing placed now with two separate episodes. ENT consulted in the ED with packing placed to right the right nare. -- packing to be removed Saturday   HTN: on metoprolol 25mg  BID as above.  BP has been elevated, will increase losartan to 50 mg daily.   PAD s/p fem-pop bypass: with left external iliac stenting 8/21. Has been on ASA/plavix since that time. Would plan to discontinue plavix and likely Eliquis monotherapy given atrial flutter as above (once able to start anticoagulation per ENT).  If not tolerating Eliquis, would favor just aspirin 81 mg daily   Anemia: in the setting of acute blood loss from nose bleed. Hgb 8.5 on admission, down to 7.4 today --ASA/plavix held on admission --ENT following for epistaxis   Asthma: PRN albuterol   For questions or updates, please contact [place] Please consult [email or web address] for contact info under   Pixie Casino, MD, FACC, [place] Director of the [facility]  [facility] Diplomate of the American Board of Clinical Lipidology Attending Cardiologist  Direct Dial: [phone]  Fax: [phone]  Website:  [email or web address]  Pixie Casino, MD  02/15/2021, 12:13 PM

## 2021-02-16 DIAGNOSIS — E871 Hypo-osmolality and hyponatremia: Secondary | ICD-10-CM

## 2021-02-16 LAB — BASIC METABOLIC PANEL
Anion gap: 6 (ref 5–15)
BUN: 12 mg/dL (ref 8–23)
CO2: 27 mmol/L (ref 22–32)
Calcium: 9 mg/dL (ref 8.9–10.3)
Chloride: 96 mmol/L — ABNORMAL LOW (ref 98–111)
Creatinine, Ser: 0.98 mg/dL (ref 0.44–1.00)
GFR, Estimated: >60 mL/min (ref >60)
Glucose, Bld: 106 mg/dL — ABNORMAL HIGH (ref 70–99)
Potassium: 4.1 mmol/L (ref 3.5–5.1)
Sodium: 129 mmol/L — ABNORMAL LOW (ref 135–145)

## 2021-02-16 LAB — CBC
HCT: 25.4 % — ABNORMAL LOW (ref 36.0–46.0)
Hemoglobin: 7.5 g/dL — ABNORMAL LOW (ref 12.0–15.0)
MCH: 21.1 pg — ABNORMAL LOW (ref 26.0–34.0)
MCHC: 29.5 g/dL — ABNORMAL LOW (ref 30.0–36.0)
MCV: 71.3 fL — ABNORMAL LOW (ref 80.0–100.0)
Platelets: 358 10*3/uL (ref 150–400)
RBC: 3.56 MIL/uL — ABNORMAL LOW (ref 3.87–5.11)
RDW: 23.1 % — ABNORMAL HIGH (ref 11.5–15.5)
WBC: 8.6 10*3/uL (ref 4.0–10.5)
nRBC: 0 % (ref 0.0–0.2)

## 2021-02-16 LAB — SODIUM, URINE, RANDOM: Sodium, Ur: 49 mmol/L

## 2021-02-16 LAB — MAGNESIUM: Magnesium: 1.9 mg/dL (ref 1.7–2.4)

## 2021-02-16 LAB — OSMOLALITY, URINE: Osmolality, Ur: 303 mOsm/kg (ref 300–900)

## 2021-02-16 MED ORDER — METOPROLOL SUCCINATE ER 50 MG PO TB24
50.0000 mg | ORAL_TABLET | Freq: Every day | ORAL | Status: DC
  Administered 2021-02-17: 50 mg via ORAL
  Filled 2021-02-16: qty 1

## 2021-02-16 MED ORDER — LOSARTAN POTASSIUM 50 MG PO TABS: 50.0000 mg | ORAL_TABLET | Freq: Every day | ORAL | 0 refills | Status: DC

## 2021-02-16 MED ORDER — SODIUM CHLORIDE 0.9 % IV SOLN
510.0000 mg | Freq: Once | INTRAVENOUS | Status: AC
  Administered 2021-02-16: 510 mg via INTRAVENOUS
  Filled 2021-02-16: qty 17

## 2021-02-16 MED ORDER — APIXABAN 5 MG PO TABS
5.0000 mg | ORAL_TABLET | Freq: Two times a day (BID) | ORAL | Status: DC
  Administered 2021-02-16 – 2021-02-17 (×3): 5 mg via ORAL
  Filled 2021-02-16 (×3): qty 1

## 2021-02-16 MED ORDER — ENSURE ENLIVE PO LIQD: 237.0000 mL | Freq: Three times a day (TID) | ORAL | 12 refills | Status: DC

## 2021-02-16 MED ORDER — APIXABAN 5 MG PO TABS: 5.0000 mg | ORAL_TABLET | Freq: Two times a day (BID) | ORAL | 0 refills | Status: DC

## 2021-02-16 MED ORDER — METOPROLOL TARTRATE 25 MG PO TABS
25.0000 mg | ORAL_TABLET | Freq: Two times a day (BID) | ORAL | Status: AC
  Administered 2021-02-16: 25 mg via ORAL
  Filled 2021-02-16: qty 1

## 2021-02-16 MED ORDER — METOPROLOL SUCCINATE ER 50 MG PO TB24: 50.0000 mg | ORAL_TABLET | Freq: Every day | ORAL | 0 refills | Status: DC

## 2021-02-16 MED ORDER — AMIODARONE HCL 200 MG PO TABS: ORAL_TABLET | ORAL | 0 refills | Status: DC

## 2021-02-16 NOTE — Discharge Summary (Addendum)
Physician Discharge Summary  Patient ID: Veronica Stewart MRN: [id number] DOB/AGE: Dec 28, 1955 65 y.o.  Admit date: 02/11/2021 Discharge date: 02/16/2021  Admission Diagnoses: atrial flutter and severe epistaxis  Discharge Diagnoses:  Principal Problem:   Uncontrolled atrial flutter ([place]) Active Problems:   Hypertension   Iron deficiency anemia   PAD (peripheral artery disease) (HCC)   Epistaxis   Acute blood loss anemia   Discharged Condition: stable  Hospital Course:   Epistaxis: Patient with severe epistaxis requiring nasal packing for about 3 days.  Chronic aspirin and Plavix were discontinued.  The packing was able to be removed on Saturday and she did not have further bleeding.  Iron deficiency anemia: The patient did have a significant anemia with a hemoglobin around 7.6 g, microcytic, and iron deficient with a very low ferritin.  This suggests that this she has had some amount of occult bleeding for quite some time.  She received 2 iron infusions during the hospitalization.  She had no symptomatic anemia so she did not receive a transfusion.  She is going to follow-up with her primary care physician and if her medical condition warrants I would suggest outpatient colonoscopy for evaluation of the source of the occult bleeding.  Atrial flutter: This was the reason for the patient's admission to our service as it was a new finding.  This was probably caused by underlying structural heart disease and the acute illness.  She spontaneously cardioverted back into sinus rhythm.  Cardiology was consulted and recommended a 4-week course of oral amiodarone to help keep her in sinus rhythm while she recovers from this hospitalization.  She was at elevated risk of stroke, she was carefully started on apixaban 1 day after the nasal packing was removed.  Aspirin and Plavix were discontinued to lower her overall bleeding risk.  New diagnosis chronic heart failure with reduced ejection fraction:  This was found on echocardiogram which we did because of the new atrial flutter.  She appears euvolemic on exam, not decompensated.  Etiology is most likely due to ischemic heart disease, may also be due to longstanding hypertension.  Cardiology recommended an outpatient ischemic evaluation once she has recovered more from this acute hospitalization.  She was discharged with goal-directed medical therapy with metoprolol and losartan.  Ischemic vascular disease: Patient with a history of femoropopliteal bypass due to critical limb ischemia.  Previously medically managed with aspirin and Plavix however we had to discontinue these due to bleeding as above.  We will continue medical management with apixaban and atorvastatin.   Consults: cardiology and ENT  Significant Diagnostic Studies: EKG and ECHO  Treatments: cardiac meds: amiodarone and Eliquis  Discharge Exam: Blood pressure 114/80, pulse 70, temperature 98.5 F (36.9 C), temperature source Oral, resp. rate 15, height 6\' 1"  (1.854 m), weight 48.1 kg, SpO2 99 %. General appearance: alert and cooperative Nose: Bandaged with gauze, blood tinged mucus from right nares Resp: clear to auscultation bilaterally Cardio: regular rate and rhythm, S1, S2 normal, no murmur, click, rub or gallop GI: soft, non-tender; bowel sounds normal; no masses,  no organomegaly Extremities: extremities normal  Disposition:   Discharge Instructions     Amb referral to [facility] Clinic   Complete by: As directed       Allergies as of 02/16/2021   No Known Allergies      Medication List     STOP taking these medications    amoxicillin-clavulanate 875-125 MG tablet Commonly known as: AUGMENTIN   aspirin 81 MG EC tablet  clopidogrel 75 MG tablet Commonly known as: PLAVIX       TAKE these medications    acetaminophen 500 MG tablet Commonly known as: TYLENOL Take 500 mg by mouth every 6 (six) hours as needed for mild pain.   albuterol 108 (90  Base) MCG/ACT inhaler Commonly known as: VENTOLIN HFA INHALE 2 PUFFS BY MOUTH EVERY 6 HOURS AS NEEDED FOR WHEEZING AND SHORTNESS OF BREATH What changed:  how much to take how to take this when to take this reasons to take this additional instructions   amiodarone 200 MG tablet Commonly known as: PACERONE Take 1 tablet (200 mg total) by mouth 2 (two) times daily for 14 days, THEN 1 tablet (200 mg total) daily for 14 days. Start taking on: February 16, 2021   apixaban 5 MG Tabs tablet Commonly known as: ELIQUIS Take 1 tablet (5 mg total) by mouth 2 (two) times daily.   atorvastatin 20 MG tablet Commonly known as: LIPITOR Take 1 tablet (20 mg total) by mouth daily.   feeding supplement Liqd Take 237 mLs by mouth 3 (three) times daily between meals. What changed:  how much to take when to take this   Fluticasone-Salmeterol 100-50 MCG/DOSE Aepb Commonly known as: ADVAIR Inhale 1 puff into the lungs 2 (two) times daily. What changed:  when to take this reasons to take this   losartan 50 MG tablet Commonly known as: COZAAR Take 1 tablet (50 mg total) by mouth daily. Start taking on: February 17, 2021   metoprolol succinate 50 MG 24 hr tablet Commonly known as: TOPROL-XL Take 1 tablet (50 mg total) by mouth daily. Take with or immediately following a meal. Start taking on: February 17, 2021   Prolia 60 MG/ML Sosy injection Generic drug: denosumab Inject 60 mg into the skin every 6 (six) months.   Vitamin D (Ergocalciferol) 1.25 MG (50000 UNIT) Caps capsule Commonly known as: DRISDOL Take 50,000 Units by mouth 2 (two) times a week. Monday and Thursday         Signed: Delene Ruffini MD 02/16/2021, 5:52 PM

## 2021-02-16 NOTE — Discharge Instructions (Addendum)
Dear Veronica Stewart  You were recently admitted for management of your new diagnosis of atrial flutter. You were started on amiodarone and metoprolol which adequately controlled your condition. You were also started on Eliquis to prevent complications from atrial flutter. Please continue taking amiodarone 200mg  twice daily for two weeks then switch two taking 200mg  once daily and then stop. Please ensure you follow up with cardiology as an outpatient.   Continue to monitor for signs of bleeding.

## 2021-02-16 NOTE — Plan of Care (Signed)

## 2021-02-16 NOTE — Progress Notes (Signed)
Progress Note  Patient Name: Veronica Stewart Date of Encounter: 02/16/2021  [facility] Cardiologist: Donato Heinz, MD   Subjective   Maintaining sinus rhythm.  Hgb stable at 7.5.  BP 126/83 this morning.  Denies any chest pain or dyspnea.  Inpatient Medications    Scheduled Meds:  amiodarone  200 mg Oral BID   apixaban  5 mg Oral BID   atorvastatin  20 mg Oral Daily   feeding supplement  237 mL Oral TID BM   fluticasone furoate-vilanterol  1 puff Inhalation Daily   losartan  25 mg Oral Once   losartan  50 mg Oral Daily   metoprolol tartrate  25 mg Oral BID   multivitamin with minerals  1 tablet Oral Daily   oxymetazoline  1 spray Each Nare BID   Continuous Infusions:  ferumoxytol 510 mg (02/11/21 1741)   PRN Meds: acetaminophen **OR** acetaminophen, albuterol, ondansetron **OR** ondansetron (ZOFRAN) IV, polyethylene glycol, sodium chloride   Vital Signs    Vitals:   02/15/21 2238 02/16/21 0000 02/16/21 0415 02/16/21 0800  BP: (!) 159/99 (!) 158/92 (!) 150/94 126/83  Pulse: 78 66 72 74  Resp: 19 13 12 13   Temp: 97.9 F (36.6 C)  98.5 F (36.9 C) 98.6 F (37 C)  TempSrc: Oral  Oral Oral  SpO2: 97%  97% 96%  Weight:   48.1 kg   Height:        Intake/Output Summary (Last 24 hours) at 02/16/2021 0932 Last data filed at 02/16/2021 0800 Gross per 24 hour  Intake 240 ml  Output 300 ml  Net -60 ml    Last 3 Weights 02/16/2021 02/15/2021 02/14/2021  Weight (lbs) 106 lb 0.7 oz 104 lb 8 oz 106 lb 11.2 oz  Weight (kg) 48.1 kg 47.4 kg 48.4 kg      Telemetry    normal sinus rhythm in 70s- Personally Reviewed  ECG    No new ECG- Personally Reviewed  Physical Exam   GEN: No acute distress.   Neck: No JVD Cardiac: RRR, 2/6 systolic murmur Respiratory: Clear to auscultation bilaterally. GI: Soft, nontender, non-distended  MS: No edema; No deformity. Neuro:  Nonfocal  Psych: Normal affect   Labs    High Sensitivity Troponin:  No results for  input(s): TROPONINIHS in the last 720 hours.    Chemistry Recent Labs  Lab 02/12/21 0005 02/13/21 0039 02/14/21 0016 02/15/21 0208 02/16/21 0007  NA 130*   < > 129* 133* 129*  K 3.4*   < > 4.6 4.5 4.1  CL 98   < > 97* 98 96*  CO2 27   < > 24 25 27   GLUCOSE 107*   < > 94 78 106*  BUN 8   < > 11 9 12   CREATININE 0.82   < > 0.89 0.78 0.98  CALCIUM 9.0   < > 9.1 9.3 9.0  PROT 7.0  --   --   --   --   ALBUMIN 3.0*  --   --   --   --   AST 13*  --   --   --   --   ALT 8  --   --   --   --   ALKPHOS 69  --   --   --   --   BILITOT 0.8  --   --   --   --   GFRNONAA >60   < > >60 >60 >60  ANIONGAP 5   < >  8 10 6    < > = values in this interval not displayed.      Hematology Recent Labs  Lab 02/14/21 0016 02/15/21 0208 02/16/21 0007  WBC 11.8* 8.6 8.6  RBC 3.47* 3.69* 3.56*  HGB 7.1* 7.6* 7.5*  HCT 24.2* 25.9* 25.4*  MCV 69.7* 70.2* 71.3*  MCH 20.5* 20.6* 21.1*  MCHC 29.3* 29.3* 29.5*  RDW 21.5* 22.7* 23.1*  PLT 360 366 358     BNPNo results for input(s): BNP, PROBNP in the last 168 hours.   DDimer No results for input(s): DDIMER in the last 168 hours.   Radiology    No results found.  Cardiac Studies   None  Patient Profile     65 y.o. female with a hx of PAD s/p fem-pop bypass, HTN, asthma who is being seen for the evaluation of new onset aflutter   Assessment & Plan    New onset Atrial Flutter RVR: Initially in the 140s on admission. Has had intermittent episodes throughout admission. CHADSVASc score 4 (CHF, HTN, PAD, female).  Echo shows EF 40 to 45% -- continue metoprolol, will consolidate to toprol XL 50 mg daily -- Given she has been going in and out of atrial flutter, started amiodarone to maintain sinus rhythm.  Appears to be maintaining NSR with amio.  Would continue amiodarone 200 mg twice daily x2 weeks, then decrease to 200 mg daily x2 weeks, then discontinue.  Will not plan on long-term amiodarone, but would continue as she recovers from her  acute illness -- Per ENT - would be ok to start anticoagulation 1-2 days after bleeding has stopped.  Started Eliquis 5 mg BID today, would monitor closely for recurrent bleeding  Acute combined systolic and diastolic heart failure: Echo 02/13/2020 shows EF 40 to 45%.  New diagnosis heart failure.   -Given her PAD history, high suspicion for obstructive CAD, though she denies any issues with chest pain.  She will need ischemia evaluation at some point, likely as an outpatient once issues with anemia/epistaxis have resolved -Continue metoprolol, will consolidate to toprol XL 50 mg daily -Continue losartan, dose increased to 50 mg daily -Appears euvolemic   Epistaxis: reports long history of freq nose bleeds. Has had to have packing placed now with two separate episodes. ENT consulted in the ED with packing placed to right the right nare.  Appears to have resolved, packing removed 02/14/21.  Starting Eliquis today as above   Hyponatremia: sodium 129 this morning.  Unclear cause, does not appear volume overloaded on exam.  Check urina Na and urine osm  HTN: on metoprolol and losartan as above   PAD s/p fem-pop bypass: with left external iliac stenting 8/21. Has been on ASA/plavix since that time. Would plan to discontinue plavix and likely Eliquis monotherapy given atrial flutter as above (once able to start anticoagulation per ENT).  If not tolerating Eliquis, would favor just aspirin 81 mg daily   Anemia: in the setting of acute blood loss from nose bleed. Hgb 8.5 on admission, stable at 7.5 today --ASA/plavix held on admission.  Starting Eliquis today --ENT following for epistaxis   Asthma: PRN albuterol   For questions or updates, please contact [place] Please consult [email or web address] for contact info under    Donato Heinz, MD  02/16/2021, 9:32 AM

## 2021-02-16 NOTE — Evaluation (Signed)
Physical Therapy Evaluation Patient Details Name: Veronica Stewart MRN: [id number] DOB: January 18, 1956 Today's Date: 02/16/2021   History of Present Illness  Pt adm 6/29 with severe epistaxis and aflutter. Packing placed for epistaxis. Pt developed acute combined heart failure. PMH - PAD, LLE bypass, HTN, femur fx  Clinical Impression  Pt presents to PT with slightly unsteady gait due to illness and inactivity. Expect pt will make good progress back to baseline with mobility. Will follow acutely but doubt pt will need PT after DC.      Follow Up Recommendations No PT follow up    Equipment Recommendations  None recommended by PT    Recommendations for Other Services       Precautions / Restrictions Precautions Precautions: None      Mobility  Bed Mobility Overal bed mobility: Independent                  Transfers Overall transfer level: Modified independent Equipment used: Straight cane                Ambulation/Gait Ambulation/Gait assistance: Supervision Gait Distance (Feet): 100 Feet Assistive device: Straight cane Gait Pattern/deviations: Step-through pattern;Decreased stride length Gait velocity: decr Gait velocity interpretation: 1.31 - 2.62 ft/sec, indicative of limited community ambulator General Gait Details: In room pt amb with confident gait. In hallway pt less confident and reaching for wall rail at times for reassurance.  Stairs            Wheelchair Mobility    Modified Rankin (Stroke Patients Only)       Balance Overall balance assessment: Needs assistance Sitting-balance support: No upper extremity supported;Feet supported Sitting balance-Leahy Scale: Normal     Standing balance support: No upper extremity supported;During functional activity Standing balance-Leahy Scale: Fair                               Pertinent Vitals/Pain Pain Assessment: No/denies pain    Home Living Family/patient expects to be  discharged to:: Private residence Living Arrangements: Other relatives Available Help at Discharge: Family;Available PRN/intermittently Type of Home: House Home Access: Level entry     Home Layout: One level Home Equipment: Walker - 2 wheels;Shower seat;Bedside commode      Prior Function Level of Independence: Independent with assistive device(s)               Hand Dominance   Dominant Hand: Right    Extremity/Trunk Assessment   Upper Extremity Assessment Upper Extremity Assessment: Overall WFL for tasks assessed    Lower Extremity Assessment Lower Extremity Assessment: Generalized weakness       Communication   Communication: No difficulties  Cognition Arousal/Alertness: Awake/alert Behavior During Therapy: Flat affect Overall Cognitive Status: Within Functional Limits for tasks assessed                                        General Comments General comments (skin integrity, edema, etc.): HR briefly to 120's and then back to 100's    Exercises     Assessment/Plan    PT Assessment Patient needs continued PT services  PT Problem List Decreased strength;Decreased mobility       PT Treatment Interventions DME instruction;Gait training;Functional mobility training;Therapeutic activities;Therapeutic exercise;Balance training;Patient/family education    PT Goals (Current goals can be found in the Care Plan section)  Acute Rehab  PT Goals Patient Stated Goal: go home PT Goal Formulation: With patient Time For Goal Achievement: 02/23/21 Potential to Achieve Goals: Good    Frequency Min 3X/week   Barriers to discharge        Co-evaluation               AM-PAC PT "6 Clicks" Mobility  Outcome Measure Help needed turning from your back to your side while in a flat bed without using bedrails?: None Help needed moving from lying on your back to sitting on the side of a flat bed without using bedrails?: None Help needed moving to  and from a bed to a chair (including a wheelchair)?: None Help needed standing up from a chair using your arms (e.g., wheelchair or bedside chair)?: None Help needed to walk in hospital room?: None Help needed climbing 3-5 steps with a railing? : A Little 6 Click Score: 23    End of Session   Activity Tolerance: Patient tolerated treatment well Patient left: in chair;with call bell/phone within reach   PT Visit Diagnosis: Other abnormalities of gait and mobility (R26.89);Muscle weakness (generalized) (M62.81)    Time: 4081-4481 PT Time Calculation (min) (ACUTE ONLY): 16 min   Charges:   PT Evaluation $PT Eval Low Complexity: [place] Pager [phone] Office [place] 02/16/2021, 1:35 PM

## 2021-02-17 NOTE — Progress Notes (Signed)
    Pt remain in NSR No further epistaxis Is on eliquis  She was discharged yesterday but did not have a ride home. Is trying to arrange a ride this am   Will set her up an appt to see Dr. Gardiner Rhyme or APP in several weeks     Mertie Moores, MD  02/17/2021 9:40 AM    [facility] [address],  [place], [place]  [address] Phone: [phone]; Fax: [phone]

## 2021-02-17 NOTE — Progress Notes (Signed)
Veronica Stewart was discharged yesterday evening after being treated for a new diagnosis of atrial flutter and epistaxis. She was boarded overnight due to transportation issues. She was able organize a ride home this morning and left in stable condition.

## 2021-02-17 NOTE — Plan of Care (Signed)
  Problem: Education: Goal: Knowledge of General Education information will improve Description: Including pain rating scale, medication(s)/side effects and non-pharmacologic comfort measures Outcome: Adequate for Discharge   Problem: Health Behavior/Discharge Planning: Goal: Ability to manage health-related needs will improve Outcome: Adequate for Discharge   Problem: Clinical Measurements: Goal: Ability to maintain clinical measurements within normal limits will improve Outcome: Adequate for Discharge Goal: Will remain free from infection Outcome: Adequate for Discharge Goal: Diagnostic test results will improve Outcome: Adequate for Discharge Goal: Respiratory complications will improve Outcome: Adequate for Discharge Goal: Cardiovascular complication will be avoided Outcome: Adequate for Discharge   Problem: Activity: Goal: Risk for activity intolerance will decrease Outcome: Adequate for Discharge   Problem: Nutrition: Goal: Adequate nutrition will be maintained Outcome: Adequate for Discharge   Problem: Coping: Goal: Level of anxiety will decrease Outcome: Adequate for Discharge   Problem: Elimination: Goal: Will not experience complications related to bowel motility Outcome: Adequate for Discharge Goal: Will not experience complications related to urinary retention Outcome: Adequate for Discharge   Problem: Pain Managment: Goal: General experience of comfort will improve Outcome: Adequate for Discharge   Problem: Safety: Goal: Ability to remain free from injury will improve Outcome: Adequate for Discharge   Problem: Skin Integrity: Goal: Risk for impaired skin integrity will decrease Outcome: Adequate for Discharge   Problem: Malnutrition  (NI-5.2) Goal: Food and/or nutrient delivery Description: Individualized approach for food/nutrient provision. Outcome: Adequate for Discharge   Problem: Acute Rehab PT Goals(only PT should resolve) Goal: Pt Will  Ambulate Outcome: Adequate for Discharge

## 2021-02-18 ENCOUNTER — Telehealth: Payer: Self-pay

## 2021-02-18 MED ORDER — APIXABAN 5 MG PO TABS: 5.0000 mg | ORAL_TABLET | Freq: Two times a day (BID) | ORAL | 0 refills | Status: DC

## 2021-02-18 MED ORDER — AMIODARONE HCL 200 MG PO TABS: ORAL_TABLET | ORAL | 0 refills | Status: DC

## 2021-02-18 MED ORDER — LOSARTAN POTASSIUM 50 MG PO TABS: 50.0000 mg | ORAL_TABLET | Freq: Every day | ORAL | 0 refills | Status: DC

## 2021-02-18 MED ORDER — METOPROLOL SUCCINATE ER 50 MG PO TB24: 50.0000 mg | ORAL_TABLET | Freq: Every day | ORAL | 0 refills | Status: DC

## 2021-02-18 NOTE — Telephone Encounter (Addendum)
Transition Care Management Follow-up Telephone Call Date of discharge and from where: 02/17/2021, [facility]  How have you been since you were released from the hospital? She said she is feeling all right Any questions or concerns? Yes - she was not able get her medications because they were $600. She is insured but not sure why they were not covered.informed her that this CM would contact her pharmacy.  Call placed to [facility]/[address], spoke to [employer] who explained that the provider who ordered the medications - Dr Elliot Gurney is not credentialed with Medicaid so Medicaid will not pay for the meds. Another provider who is credentialed with Medicaid will need to order the meds. This CM contacted the discharging provider and attending at the hospital regarding the med issue and Dr Gilford Rile resent the prescriptions to [facility] pharmacy.   Call placed to the patient and informed her about the reason why the medications were so expensive and explained that another provider has re-sent the prescriptions to her pharmacy.  She needs to contact her pharmacy to confirm when they will be ready for pick up.  She said she understood and will contact her pharmacy.   Items Reviewed: Did the pt receive and understand the discharge instructions provided? Yes  Medications obtained and verified? Please see note above regarding medications. She said she has the other medications and did not have any questions about her med regime.  Other? No  Any new allergies since your discharge? No  Do you have support at home? Yes   Home Care and Equipment/Supplies: Were home health services ordered? no If so, what is the name of the agency? N/a  Has the agency set up a time to come to the patient's home? not applicable Were any new equipment or medical supplies ordered?  No What is the name of the medical supply agency? N/a Were you able to get the supplies/equipment? not applicable Do you  have any questions related to the use of the equipment or supplies? No  Functional Questionnaire: (I = Independent and D = Dependent) ADLs: independent    Follow up appointments reviewed:  PCP Hospital f/u appt confirmed? Yes  Scheduled to see Juluis Mire, NP  on 02/26/2021 @ 1430. [facility] f/u appt confirmed? Yes  Scheduled to see cardiology- 03/11/2021.  Are transportation arrangements needed? No  If their condition worsens, is the pt aware to call PCP or go to the Emergency Dept.? Yes Was the patient provided with contact information for the PCP's office or ED? Yes Was to pt encouraged to call back with questions or concerns? Yes

## 2021-02-26 ENCOUNTER — Ambulatory Visit (INDEPENDENT_AMBULATORY_CARE_PROVIDER_SITE_OTHER): Payer: Medicaid Other | Admitting: Primary Care

## 2021-02-26 ENCOUNTER — Encounter (INDEPENDENT_AMBULATORY_CARE_PROVIDER_SITE_OTHER): Payer: Self-pay | Admitting: Primary Care

## 2021-02-26 ENCOUNTER — Other Ambulatory Visit: Payer: Self-pay

## 2021-02-26 VITALS — BP 145/96 | HR 81 | Temp 97.5°F | Ht 69.0 in | Wt 104.8 lb

## 2021-02-26 DIAGNOSIS — R040 Epistaxis: Secondary | ICD-10-CM

## 2021-02-26 DIAGNOSIS — I4892 Unspecified atrial flutter: Secondary | ICD-10-CM

## 2021-02-26 DIAGNOSIS — Z681 Body mass index (BMI) 19 or less, adult: Secondary | ICD-10-CM | POA: Diagnosis not present

## 2021-02-26 DIAGNOSIS — Z09 Encounter for follow-up examination after completed treatment for conditions other than malignant neoplasm: Secondary | ICD-10-CM | POA: Diagnosis not present

## 2021-02-26 NOTE — Progress Notes (Signed)
[facility]   Subjective:   Veronica Stewart is a 65 y.o. female presents for hospital follow up .  She presented to the emergency room on 02/11/2021 for persistent epistaxis admitted for newly diagnosed atrial flutter with rapid ventricular response.  admit date to the hospital was 02/11/21, patient was discharged from the hospital on 02/17/21, patient was admitted for: Hypertension, Iron deficiency anemia, PAD (peripheral artery disease) (HCC) ,Epistaxis- continues scant, Uncontrolled atrial flutter ([place]), Acute blood loss anemia and Atrial fibrillation ([place]) Resolved.  Blood pressure remains elevated despite taking all medications prescribed she denies. Denies shortness of breath, headaches, chest pain or lower extremity edema . Hx of alcohol abuse- states not that much anymore never quantified.  Past Medical History:  Diagnosis Date   Asthma    Hypertension      No Known Allergies    Current Outpatient Medications on File Prior to Visit  Medication Sig Dispense Refill   acetaminophen (TYLENOL) 500 MG tablet Take 500 mg by mouth every 6 (six) hours as needed for mild pain.     amiodarone (PACERONE) 200 MG tablet Take 1 tablet (200 mg total) by mouth 2 (two) times daily for 14 days, THEN 1 tablet (200 mg total) daily for 14 days. 42 tablet 0   apixaban (ELIQUIS) 5 MG TABS tablet Take 1 tablet (5 mg total) by mouth 2 (two) times daily. 60 tablet 0   Fluticasone-Salmeterol (ADVAIR) 100-50 MCG/DOSE AEPB Inhale 1 puff into the lungs 2 (two) times daily. 1 each 3   losartan (COZAAR) 50 MG tablet Take 1 tablet (50 mg total) by mouth daily. 30 tablet 0   metoprolol succinate (TOPROL-XL) 50 MG 24 hr tablet Take 1 tablet (50 mg total) by mouth daily. Take with or immediately following a meal. 30 tablet 0   albuterol (VENTOLIN HFA) 108 (90 Base) MCG/ACT inhaler INHALE 2 PUFFS BY MOUTH EVERY 6 HOURS AS NEEDED FOR WHEEZING AND SHORTNESS OF BREATH 8.5 g 2   atorvastatin (LIPITOR) 20 MG  tablet Take 1 tablet (20 mg total) by mouth daily. (Patient not taking: Reported on 02/26/2021) 30 tablet 11   feeding supplement (ENSURE ENLIVE / ENSURE PLUS) LIQD Take 237 mLs by mouth 3 (three) times daily between meals. 237 mL 12   PROLIA 60 MG/ML SOSY injection Inject 60 mg into the skin every 6 (six) months.  (Patient not taking: Reported on 02/26/2021)     Vitamin D, Ergocalciferol, (DRISDOL) 1.25 MG (50000 UNIT) CAPS capsule Take 50,000 Units by mouth 2 (two) times a week. Monday and Thursday (Patient not taking: Reported on 02/26/2021)     Current Facility-Administered Medications on File Prior to Visit  Medication Dose Route Frequency Provider Last Rate Last Admin   albuterol (PROVENTIL) (2.5 MG/3ML) 0.083% nebulizer solution 2.5 mg  2.5 mg Nebulization Once Kerin Perna, NP         Review of System: Review of Systems  Constitutional:  Positive for weight loss.       Cachectic  HENT:  Positive for congestion and nosebleeds.   All other systems reviewed and are negative.  Objective:  BP (!) 145/96 (BP Location: Right Arm, Patient Position: Sitting, Cuff Size: Small)   Pulse 81   Temp (!) 97.5 F (36.4 C) (Temporal)   Ht 5\' 9"  (1.753 m)   Wt 104 lb 12.8 oz (47.5 kg)   SpO2 97%   BMI 15.48 kg/m   Filed Weights   02/26/21 1435  Weight: 104 lb 12.8  oz (47.5 kg)    Physical Exam:   General Appearance: Well nourished, in no apparent distress. Eyes: PERRLA, EOMs, conjunctiva no swelling or erythema Sinuses: No Frontal/maxillary tenderness ENT/Mouth: Ext aud canals clear, TMs without erythema, bulging. No erythema, swelling, or exudate on post pharynx.  Tonsils not swollen or erythematous. Hearing normal.  Neck: Supple, thyroid normal.  Respiratory: Respiratory effort normal, BS equal bilaterally without rales, rhonchi, wheezing or stridor.  Cardio: RRR with no MRGs. Brisk peripheral pulses without edema.  Abdomen: Soft, + BS.  Non tender, no guarding, rebound,  hernias, masses. Lymphatics: Non tender without lymphadenopathy.  Musculoskeletal: Full ROM, 5/5 strength, normal gait.  Skin: Warm, dry without rashes, lesions, ecchymosis.  Neuro: Cranial nerves intact. Normal muscle tone, no cerebellar symptoms. Sensation intact.  Psych: Awake and oriented X 3, normal affect, Insight and Judgment appropriate.    Assessment:  [name] was seen today for hospitalization follow-up.  Diagnoses and all orders for this visit:  Hospital discharge follow-up Recommended follow-up with cardiology Patient schedule appt to make PCP aware of her hospitalization  Body mass index (BMI) less than 16.5 Cachectic needs to try to eat more and nutritional supplement between meals Ensure to increase BMI and stop alcohol use  Epistaxis Continues to have nosebleeds also on anticoagulation medication with a combination of alcohol increases risk of bruising and bleeding will refer to ENT  Uncontrolled atrial flutter ([place])   Follow up with Donato Heinz, MD (Cardiology) on 03/11/2021; Please arrive 15 minutes early for you 8am post-hospital cardiology appointment.   This note has been created with Surveyor, quantity. Any transcriptional errors are unintentional.   Kerin Perna, NP 02/26/2021, 3:08 PM

## 2021-02-26 NOTE — Patient Instructions (Signed)
[email or web address]">  High-Protein and High-Calorie Diet Eating high-protein and high-calorie foods can help you to gain weight, heal after an injury, and recover after an illness or surgery. The specific amount of daily protein and calories you need depends on: Your body weight. The reason this diet is recommended for you. Generally, a high-protein, high-calorie diet involves: Eating 250-500 extra calories each day. Making sure that you get enough of your daily calories from protein. Ask your health care provider how many of your calories should come from protein. Talk with a health care provider or a dietitian about how much protein and how many calories you need each day. Follow the diet as directed by your healthcare provider. What are tips for following this plan?  Reading food labels Check the nutrition facts label for calories, grams of fat and protein. Items with more than 4 grams of protein are high-protein foods. Preparing meals Add whole milk, half-and-half, or heavy cream to cereal, pudding, soup, or hot cocoa. Add whole milk to instant breakfast drinks. Add peanut butter to oatmeal or smoothies. Add powdered milk to baked goods, smoothies, or milkshakes. Add powdered milk, cream, or butter to mashed potatoes. Add cheese to cooked vegetables. Make whole-milk yogurt parfaits. Top them with granola, fruit, or nuts. Add cottage cheese to fruit. Add avocado, cheese, or both to sandwiches or salads. Add avocado to smoothies. Add meat, poultry, or seafood to rice, pasta, casseroles, salads, and soups. Use mayonnaise when making egg salad, chicken salad, or tuna salad. Use peanut butter as a dip for fruits and vegetables or as a topping for pretzels, celery, or crackers. Add beans to casseroles, dips, and spreads. Add pureed beans to sauces and soups. Replace calorie-free drinks with  calorie-containing drinks, such as milk and fruit juice. Replace water with milk or heavy cream when making foods such as oatmeal, pudding, or cocoa. Add oil or butter to cooked vegetables and grains. Add cream cheese to sandwiches or as a topping on crackers and bread. Make cream-based pastas and soups. General information Ask your health care provider if you should take a nutritional supplement. Try to eat six small meals each day instead of three large meals. A general goal is to eat every 2 to 3 hours. Eat a balanced diet. In each meal, include one food that is high in protein and one food with fat in it. Keep nutritious snacks available, such as nuts, trail mixes, dried fruit, and yogurt. If you have kidney disease or diabetes, talk with your health care provider about how much protein is safe for you. Too much protein may put extra stress on your kidneys. Drink your calories. Choose high-calorie drinks and have them after your meals. Consider setting a timer to remind you to eat. You will want to eat even if you do not feel very hungry. What high-protein foods should I eat?  Vegetables Soybeans. Peas. Grains Quinoa. Bulgur wheat. Buckwheat. Meats and other proteins Beef, pork, and poultry. Fish and seafood. Eggs. Tofu. Textured vegetable protein (TVP). Peanut butter. Nuts and seeds. Dried beans. Protein powders.Hummus. Dairy Whole milk. Whole-milk yogurt. Powdered milk. Cheese. Yahoo. Eggnog. Beverages High-protein supplement drinks. Soy milk. Other foods Protein bars. The items listed above may not be a complete list of foods and beverages you can eat and drink. Contact a dietitian for more information. What high-calorie foods should I eat? Fruits Dried fruit. Fruit leather. Canned fruit in syrup. Fruit juice. Avocado. Vegetables Vegetables cooked in oil or butter. Fried potatoes. Grains  Pasta. Quick breads. Muffins. Pancakes. Ready-to-eat cereal. Meats and other  proteins Peanut butter. Nuts and seeds. Dairy Heavy cream. Whipped cream. Cream cheese. Sour cream. Ice cream. Custard.Pudding. Whole milk dairy products. Beverages Meal-replacement beverages. Nutrition shakes. Fruit juice. Seasonings and condiments Salad dressing. Mayonnaise. Alfredo sauce. Fruit preserves or jelly. Honey.Syrup. Sweets and desserts Cake. Cookies. Pie. Pastries. Candy bars. Chocolate. Fats and oils Butter or margarine. Oil. Gravy. Other foods Meal-replacement bars. The items listed above may not be a complete list of foods and beverages you can eat and drink. Contact a dietitian for more information. Summary A high-protein, high-calorie diet can help you gain weight or heal faster after an injury, illness, or surgery. To increase your protein and calories, add ingredients such as whole milk, peanut butter, cheese, beans, meat, or seafood to meal items. To get enough extra calories each day, include high-calorie foods and beverages at each meal. Adding a high-calorie drink or shake can be an easy way to help you get enough calories each day. Talk with your healthcare provider or dietitian about the best options for you. This information is not intended to replace advice given to you by your health care provider. Make sure you discuss any questions you have with your healthcare provider. Document Revised: 07/06/2020 Document Reviewed: 07/06/2020 Elsevier Patient Education  2022 [employer].

## 2021-03-07 ENCOUNTER — Emergency Department (HOSPITAL_COMMUNITY)
Admission: EM | Admit: 2021-03-07 | Discharge: 2021-03-07 | Disposition: A | Discharge status: Home / Self Care | Payer: Medicaid Other | Attending: Emergency Medicine | Admitting: Emergency Medicine

## 2021-03-07 ENCOUNTER — Emergency Department (HOSPITAL_COMMUNITY)
Admission: EM | Admit: 2021-03-07 | Discharge: 2021-03-08 | Disposition: A | Discharge status: Home / Self Care | Payer: Medicaid Other | Source: Home / Self Care | Attending: Emergency Medicine | Admitting: Emergency Medicine

## 2021-03-07 ENCOUNTER — Other Ambulatory Visit: Payer: Self-pay

## 2021-03-07 DIAGNOSIS — Z79899 Other long term (current) drug therapy: Secondary | ICD-10-CM | POA: Insufficient documentation

## 2021-03-07 DIAGNOSIS — F17210 Nicotine dependence, cigarettes, uncomplicated: Secondary | ICD-10-CM | POA: Insufficient documentation

## 2021-03-07 DIAGNOSIS — I4892 Unspecified atrial flutter: Secondary | ICD-10-CM | POA: Diagnosis not present

## 2021-03-07 DIAGNOSIS — J45909 Unspecified asthma, uncomplicated: Secondary | ICD-10-CM | POA: Insufficient documentation

## 2021-03-07 DIAGNOSIS — Z7952 Long term (current) use of systemic steroids: Secondary | ICD-10-CM | POA: Insufficient documentation

## 2021-03-07 DIAGNOSIS — I10 Essential (primary) hypertension: Secondary | ICD-10-CM | POA: Insufficient documentation

## 2021-03-07 DIAGNOSIS — Z7901 Long term (current) use of anticoagulants: Secondary | ICD-10-CM | POA: Insufficient documentation

## 2021-03-07 DIAGNOSIS — R040 Epistaxis: Secondary | ICD-10-CM | POA: Diagnosis present

## 2021-03-07 NOTE — ED Provider Notes (Signed)
Emergency Medicine Provider Triage Evaluation Note  Veronica Stewart , a 65 y.o. female  was evaluated in triage.  Pt complains of nosebleed x 1 hour.  Takes eliquis.  No provoking factors.  Bleeding from the right sided only.  Patient has attempted to apply pressure.  Review of Systems  Positive: Bleeding Negative: pain  Physical Exam  BP (!) 145/85 (BP Location: Right Arm)   Pulse 67   Temp 97.9 F (36.6 C)   Resp 17   SpO2 100%  Gen:   Awake, no distress   Resp:  Normal effort  MSK:   Moves extremities without difficulty  Other:  Bleeding from right nares  Medical Decision Making  Medically screening exam initiated at 12:57 AM.  Appropriate orders placed.  Veronica Stewart was informed that the remainder of the evaluation will be completed by another provider, this initial triage assessment does not replace that evaluation, and the importance of remaining in the ED until their evaluation is complete.  Charolett Bumpers, PA-C 03/07/21 [id number]    Ripley Fraise, MD 03/07/21 [phone]

## 2021-03-07 NOTE — Discharge Instructions (Addendum)
Your history and exam today are consistent with a right-sided nosebleed or epistaxis similar to last month.  After the spray and holding pressure for many hours overnight, the bleeding has significant proved and stopped.  We had a shared decision-making conversation and offered more extensive evaluation and possible packing if needed however your symptoms have resolved.  We also offered blood work but due to your lack of other symptoms you did not want to pursue this.  Please follow-up with your ENT physician this week and if any symptoms change or worsen, please return to the nearest emergency department as you would likely need further management and packing at that time.

## 2021-03-07 NOTE — ED Triage Notes (Signed)
Pt via EMS for recurrent two hour nosebleed today. Seen yesterday for same. On two blood thinners. Bleeding minimal in triage.

## 2021-03-07 NOTE — ED Triage Notes (Addendum)
Pt BIB recurrent nosebleeds. Nosebleed ongoing for past 45 minutes. Bleeding has not improvement and is ongoing regardless of afrin x2. On eliquis

## 2021-03-07 NOTE — ED Provider Notes (Signed)
[facility] EMERGENCY DEPARTMENT Provider Note   CSN: [id number] Arrival date & time: 03/07/21  [id number]     History Chief Complaint  Patient presents with   Epistaxis    Veronica Stewart is a 66 y.o. female.  The history is provided by the patient and medical records. No language interpreter was used.  Epistaxis Location:  R nare Severity:  Severe (now resolved) Duration:  9 hours Timing:  Constant Progression:  Resolved Chronicity:  Recurrent Context: anticoagulants   Relieved by:  Vasoconstrictors and applying pressure (afrin and pressure) Ineffective treatments:  None tried Associated symptoms: no blood in oropharynx, no congestion, no cough, no dizziness, no fever, no headaches, no sinus pain, no sneezing, no sore throat and no syncope       Past Medical History:  Diagnosis Date   Asthma    Hypertension     Patient Active Problem List   Diagnosis Date Noted   Acute blood loss anemia    Uncontrolled atrial flutter ([place]) 02/11/2021   Epistaxis    PAD (peripheral artery disease) ([place]) 03/24/2020   Iron deficiency anemia    Hypertension 03/18/2018   Asthma 03/18/2018   Protein-calorie malnutrition, severe 03/18/2018    Past Surgical History:  Procedure Laterality Date   ABDOMINAL AORTOGRAM W/LOWER EXTREMITY Bilateral 03/05/2020   Procedure: ABDOMINAL AORTOGRAM W/LOWER EXTREMITY;  Surgeon: Marty Heck, MD;  Location: [place] [facility];  Service: Cardiovascular;  Laterality: Bilateral;   ENDARTERECTOMY FEMORAL Left 03/24/2020   Procedure: LEFT COMMON FEMORAL ENDARTERECTOMY;  Surgeon: Marty Heck, MD;  Location: [place];  Service: Vascular;  Laterality: Left;   ENDOVEIN HARVEST OF GREATER SAPHENOUS VEIN Left 03/24/2020   Procedure: HARVEST OF GREATER SAPHENOUS VEIN;  Surgeon: Marty Heck, MD;  Location: [place];  Service: Vascular;  Laterality: Left;   FEMORAL-POPLITEAL BYPASS GRAFT Left 03/24/2020   Procedure: BYPASS GRAFT  FEMORAL-POPLITEAL ARTERY;  Surgeon: Marty Heck, MD;  Location: [place];  Service: Vascular;  Laterality: Left;   INSERTION OF ILIAC STENT Left 03/24/2020   Procedure: INSERTION OF RETROGRADE ILIAC STENT;  Surgeon: Marty Heck, MD;  Location: [place];  Service: Vascular;  Laterality: Left;   INTRAMEDULLARY (IM) NAIL INTERTROCHANTERIC Left 03/18/2018   Procedure: INTRAMEDULLARY (IM) NAIL INTERTROCHANTRIC;  Surgeon: Shona Needles, MD;  Location: [place];  Service: Orthopedics;  Laterality: Left;   INTRAOPERATIVE ARTERIOGRAM Left 03/24/2020   Procedure: INTRA OPERATIVE ARTERIOGRAM;  Surgeon: Marty Heck, MD;  Location: [place];  Service: Vascular;  Laterality: Left;     OB History   No obstetric history on file.     Family History  Problem Relation Age of Onset   Sudden Cardiac Death Neg Hx     Social History   Tobacco Use   Smoking status: Every Day    Packs/day: 0.50    Types: Cigarettes   Smokeless tobacco: Never  Vaping Use   Vaping Use: Never used  Substance Use Topics   Alcohol use: Yes    Comment: two 40 oz beers most days    Drug use: Not Currently    Types: Marijuana    Home Medications Prior to Admission medications   Medication Sig Start Date End Date Taking? Authorizing Provider  acetaminophen (TYLENOL) 500 MG tablet Take 500 mg by mouth every 6 (six) hours as needed for mild pain.    [provider]  albuterol (VENTOLIN HFA) 108 (90 Base) MCG/ACT inhaler INHALE 2 PUFFS BY MOUTH EVERY 6  HOURS AS NEEDED FOR WHEEZING AND SHORTNESS OF BREATH 12/06/19   Kerin Perna, NP  amiodarone (PACERONE) 200 MG tablet Take 1 tablet (200 mg total) by mouth 2 (two) times daily for 14 days, THEN 1 tablet (200 mg total) daily for 14 days. 02/18/21 03/18/21  Maudie Mercury, MD  apixaban (ELIQUIS) 5 MG TABS tablet Take 1 tablet (5 mg total) by mouth 2 (two) times daily. 02/18/21 03/20/21  Maudie Mercury, MD  atorvastatin (LIPITOR) 20 MG tablet Take 1 tablet (20 mg  total) by mouth daily. Patient not taking: Reported on 02/26/2021 03/27/20   Baglia, Corrina, PA-C  feeding supplement (ENSURE ENLIVE / ENSURE PLUS) LIQD Take 237 mLs by mouth 3 (three) times daily between meals. 02/16/21   Delene Ruffini, MD  Fluticasone-Salmeterol (ADVAIR) 100-50 MCG/DOSE AEPB Inhale 1 puff into the lungs 2 (two) times daily. 12/06/19   Kerin Perna, NP  losartan (COZAAR) 50 MG tablet Take 1 tablet (50 mg total) by mouth daily. 02/18/21 03/20/21  Maudie Mercury, MD  metoprolol succinate (TOPROL-XL) 50 MG 24 hr tablet Take 1 tablet (50 mg total) by mouth daily. Take with or immediately following a meal. 02/18/21 03/20/21  Maudie Mercury, MD  PROLIA 60 MG/ML SOSY injection Inject 60 mg into the skin every 6 (six) months.  Patient not taking: Reported on 02/26/2021 02/05/20   [provider]  Vitamin D, Ergocalciferol, (DRISDOL) 1.25 MG (50000 UNIT) CAPS capsule Take 50,000 Units by mouth 2 (two) times a week. Monday and Thursday Patient not taking: Reported on 02/26/2021 12/03/19   [provider]    Allergies    Patient has no known allergies.  Review of Systems   Review of Systems  Constitutional:  Negative for chills, diaphoresis, fatigue and fever.  HENT:  Positive for nosebleeds. Negative for congestion, sinus pain, sneezing and sore throat.   Eyes:  Negative for visual disturbance.  Respiratory:  Negative for cough, chest tightness, shortness of breath and wheezing.   Cardiovascular:  Negative for chest pain and syncope.  Gastrointestinal:  Negative for abdominal pain, constipation, diarrhea, nausea and vomiting.  Genitourinary:  Negative for dysuria and flank pain.  Musculoskeletal:  Negative for back pain and neck pain.  Skin:  Negative for rash and wound.  Neurological:  Negative for dizziness, syncope, weakness, light-headedness and headaches.  Psychiatric/Behavioral:  Negative for agitation.   All other systems reviewed and are  negative.  Physical Exam Updated Vital Signs BP (!) 128/93 (BP Location: Right Arm)   Pulse 60   Temp 97.9 F (36.6 C) (Axillary)   Resp 16   Ht '5\' 9"'$  (1.753 m)   Wt 47.2 kg   SpO2 100%   BMI 15.36 kg/m   Physical Exam Vitals and nursing note reviewed.  Constitutional:      General: She is not in acute distress.    Appearance: She is well-developed. She is not ill-appearing, toxic-appearing or diaphoretic.  HENT:     Head: Atraumatic.     Comments: Some nasal congestion in the right nare as well as some dried epistaxis.  No active bleeding seen.  No blood in the posterior oropharynx.  No other symptoms.    Nose: Congestion present. No nasal deformity, signs of injury or nasal tenderness.     Right Nostril: Epistaxis (dried and resolved) present. No foreign body or occlusion.     Left Nostril: No epistaxis.     Mouth/Throat:     Mouth: Mucous membranes are moist.  Pharynx: No oropharyngeal exudate or posterior oropharyngeal erythema.  Eyes:     Extraocular Movements: Extraocular movements intact.     Conjunctiva/sclera: Conjunctivae normal.     Pupils: Pupils are equal, round, and reactive to light.  Cardiovascular:     Rate and Rhythm: Normal rate and regular rhythm.     Heart sounds: No murmur heard. Pulmonary:     Effort: Pulmonary effort is normal. No respiratory distress.     Breath sounds: Normal breath sounds.  Abdominal:     Palpations: Abdomen is soft.     Tenderness: There is no abdominal tenderness.  Musculoskeletal:        General: No tenderness.     Cervical back: Neck supple. No rigidity or tenderness.  Skin:    General: Skin is warm and dry.     Capillary Refill: Capillary refill takes less than 2 seconds.     Findings: No erythema.  Neurological:     General: No focal deficit present.     Mental Status: She is alert.    ED Results / Procedures / Treatments   Labs (all labs ordered are listed, but only abnormal results are displayed) Labs  Reviewed - No data to display  EKG None  Radiology No results found.  Procedures Procedures   Medications Ordered in ED Medications - No data to display  ED Course  I have reviewed the triage vital signs and the nursing notes.  Pertinent labs & imaging results that were available during my care of the patient were reviewed by me and considered in my medical decision making (see chart for details).    MDM Rules/Calculators/A&P                           Veronica Stewart is a 65 y.o. female with a past medical history significant for permanent atrial flutter on Eliquis therapy, hypertension, peripheral chill disease, asthma, and recent admission for anemia due to epistaxis who presents with epistaxis.  She reports that last night around 11 PM, she was drinking some soda when she thought she was having a runny nose but found to have epistaxis.  She reports it bled significantly for around an hour prompting her to call EMS.  They provide her with Afrin sprays and she was holding pressure before coming to emergency department.  On arrival, patient otherwise is well-appearing without hypotension, tachycardia, or any symptoms of symptomatic anemia.  She was having bleeding from her right nare and was instructed to hold pressure while awaiting further evaluation.  She denies significant headache, neck pain, facial pain, nausea, vomiting, constipation, diarrhea.  Denies lightheadedness, palpitations, chest pain, shortness of breath.  Denies other new complaints.  She does not feel like she did when she had significant anemia.  On my evaluation, she has been in the emergency department for approximately 7 and half hours waiting for reevaluation.  On my initial exam, patient has had resolution of the epistaxis.  She still has some rhinorrhea in the right nare but there is only scant oozing of blood.  There is no bleeding going down the back of her throat.  No brisk or venous bleeding appreciated.  She  is denying any symptoms.  Lungs clear and chest nontender.  Abdomen nontender.  Had a shared decision made conversation with patient.  We offered her to blow her nose for more thorough exam and discussion that she may need more intense packing versus letting her go  home as the epistaxis has essentially resolved and she is asymptomatic now.  Patient says she would rather not have [place] evaluate her nose further as the significant bleeding has stopped and she is scheduled to see her ENT in 3 days.  She also says that with lack of other symptoms she does not want to have  blood work done at this time.  She would rather go home and follow-up.  She understands that if symptoms return and she has to come back to emergency department, she would likely need more extensive evaluation with nasal packing and blood work.  Patient agrees and would still like to go home.  Patient will be p.o. challenged and if she is able to eat and drink and feels well, she will be discharged home to follow-up with her ENT this week.   10:36 AM Patient passed p.o. challenge per nursing and was able to ambulate and walk around without any further nasal bleeding.  She is feeling better and would like to go home.  Patient will follow-up with ENT this week and understands return precautions.  She no questions or concerns and was discharged in good condition.  Final Clinical Impression(s) / ED Diagnoses Final diagnoses:  Right-sided epistaxis    Rx / DC Orders ED Discharge Orders     None       Clinical Impression: 1. Right-sided epistaxis     Disposition: Discharge  Condition: Good  I have discussed the results, Dx and Tx plan with the pt(& family if present). He/she/they expressed understanding and agree(s) with the plan. Discharge instructions discussed at great length. Strict return precautions discussed and pt &/or family have verbalized understanding of the instructions. No further questions at time of discharge.     New Prescriptions   No medications on file    Follow Up: your ENT this week        Katharin Schneider, Gwenyth Allegra, MD 03/07/21 1036

## 2021-03-07 NOTE — ED Notes (Signed)
No bleeding ginger ale given to drink

## 2021-03-07 NOTE — ED Notes (Signed)
No bleeding at present 

## 2021-03-08 LAB — HEMOGLOBIN AND HEMATOCRIT, BLOOD
HCT: 35.9 % — ABNORMAL LOW (ref 36.0–46.0)
Hemoglobin: 10.8 g/dL — ABNORMAL LOW (ref 12.0–15.0)

## 2021-03-08 MED ORDER — OXYMETAZOLINE HCL 0.05 % NA SOLN
1.0000 | Freq: Once | NASAL | Status: AC
  Administered 2021-03-08: 1 via NASAL
  Filled 2021-03-08: qty 30

## 2021-03-08 NOTE — ED Notes (Signed)
Taxi voucher given to pt.

## 2021-03-08 NOTE — ED Provider Notes (Signed)
[facility] EMERGENCY DEPARTMENT Provider Note   CSN: [id number] Arrival date & time: 03/07/21  1637     History Chief Complaint  Patient presents with   Epistaxis    Veronica Stewart is a 65 y.o. female.  Patient with history of peripheral arterial disease status post intervention, atrial flutter, on chronic anticoagulation with Eliquis, recent admission for nosebleeding with hgb into 7's --p resents for continued nosebleed.  This has been ongoing for the past 1 to 2 days.  Patient had an emergency department visit yesterday and a prolonged wait time during which the bleeding stopped.  She was discharged home and the bleeding resumed.  While waiting in the emergency department for 14 additional hours, she has had intermittent episodes of bleeding including at time of exam.  No lightheadedness, syncope, chest pain or shortness of breath.  No URI symptoms.  No fever or other infectious symptoms.  Last took anticoagulant after returning home yesterday morning.       Past Medical History:  Diagnosis Date   Asthma    Hypertension     Patient Active Problem List   Diagnosis Date Noted   Acute blood loss anemia    Uncontrolled atrial flutter ([place]) 02/11/2021   Epistaxis    PAD (peripheral artery disease) ([place]) 03/24/2020   Iron deficiency anemia    Hypertension 03/18/2018   Asthma 03/18/2018   Protein-calorie malnutrition, severe 03/18/2018    Past Surgical History:  Procedure Laterality Date   ABDOMINAL AORTOGRAM W/LOWER EXTREMITY Bilateral 03/05/2020   Procedure: ABDOMINAL AORTOGRAM W/LOWER EXTREMITY;  Surgeon: Marty Heck, MD;  Location: [place] [facility];  Service: Cardiovascular;  Laterality: Bilateral;   ENDARTERECTOMY FEMORAL Left 03/24/2020   Procedure: LEFT COMMON FEMORAL ENDARTERECTOMY;  Surgeon: Marty Heck, MD;  Location: [place];  Service: Vascular;  Laterality: Left;   ENDOVEIN HARVEST OF GREATER SAPHENOUS VEIN Left 03/24/2020    Procedure: HARVEST OF GREATER SAPHENOUS VEIN;  Surgeon: Marty Heck, MD;  Location: [place];  Service: Vascular;  Laterality: Left;   FEMORAL-POPLITEAL BYPASS GRAFT Left 03/24/2020   Procedure: BYPASS GRAFT FEMORAL-POPLITEAL ARTERY;  Surgeon: Marty Heck, MD;  Location: [place];  Service: Vascular;  Laterality: Left;   INSERTION OF ILIAC STENT Left 03/24/2020   Procedure: INSERTION OF RETROGRADE ILIAC STENT;  Surgeon: Marty Heck, MD;  Location: [place];  Service: Vascular;  Laterality: Left;   INTRAMEDULLARY (IM) NAIL INTERTROCHANTERIC Left 03/18/2018   Procedure: INTRAMEDULLARY (IM) NAIL INTERTROCHANTRIC;  Surgeon: Shona Needles, MD;  Location: [place];  Service: Orthopedics;  Laterality: Left;   INTRAOPERATIVE ARTERIOGRAM Left 03/24/2020   Procedure: INTRA OPERATIVE ARTERIOGRAM;  Surgeon: Marty Heck, MD;  Location: [place];  Service: Vascular;  Laterality: Left;     OB History   No obstetric history on file.     Family History  Problem Relation Age of Onset   Sudden Cardiac Death Neg Hx     Social History   Tobacco Use   Smoking status: Every Day    Packs/day: 0.50    Types: Cigarettes   Smokeless tobacco: Never  Vaping Use   Vaping Use: Never used  Substance Use Topics   Alcohol use: Yes    Comment: two 40 oz beers most days    Drug use: Not Currently    Types: Marijuana    Home Medications Prior to Admission medications   Medication Sig Start Date End Date Taking? Authorizing Provider  acetaminophen (TYLENOL) 500 MG  tablet Take 500 mg by mouth every 6 (six) hours as needed for mild pain.    [provider]  albuterol (VENTOLIN HFA) 108 (90 Base) MCG/ACT inhaler INHALE 2 PUFFS BY MOUTH EVERY 6 HOURS AS NEEDED FOR WHEEZING AND SHORTNESS OF BREATH 12/06/19   Kerin Perna, NP  amiodarone (PACERONE) 200 MG tablet Take 1 tablet (200 mg total) by mouth 2 (two) times daily for 14 days, THEN 1 tablet (200 mg total) daily for 14 days. 02/18/21  03/18/21  Maudie Mercury, MD  apixaban (ELIQUIS) 5 MG TABS tablet Take 1 tablet (5 mg total) by mouth 2 (two) times daily. 02/18/21 03/20/21  Maudie Mercury, MD  atorvastatin (LIPITOR) 20 MG tablet Take 1 tablet (20 mg total) by mouth daily. Patient not taking: Reported on 02/26/2021 03/27/20   Baglia, Corrina, PA-C  feeding supplement (ENSURE ENLIVE / ENSURE PLUS) LIQD Take 237 mLs by mouth 3 (three) times daily between meals. 02/16/21   Delene Ruffini, MD  Fluticasone-Salmeterol (ADVAIR) 100-50 MCG/DOSE AEPB Inhale 1 puff into the lungs 2 (two) times daily. 12/06/19   Kerin Perna, NP  losartan (COZAAR) 50 MG tablet Take 1 tablet (50 mg total) by mouth daily. 02/18/21 03/20/21  Maudie Mercury, MD  metoprolol succinate (TOPROL-XL) 50 MG 24 hr tablet Take 1 tablet (50 mg total) by mouth daily. Take with or immediately following a meal. 02/18/21 03/20/21  Maudie Mercury, MD  PROLIA 60 MG/ML SOSY injection Inject 60 mg into the skin every 6 (six) months.  Patient not taking: Reported on 02/26/2021 02/05/20   [provider]  Vitamin D, Ergocalciferol, (DRISDOL) 1.25 MG (50000 UNIT) CAPS capsule Take 50,000 Units by mouth 2 (two) times a week. Monday and Thursday Patient not taking: Reported on 02/26/2021 12/03/19   [provider]    Allergies    Patient has no known allergies.  Review of Systems   Review of Systems  Constitutional:  Negative for chills, fatigue and fever.  HENT:  Positive for nosebleeds. Negative for congestion, ear pain, rhinorrhea, sinus pressure and sore throat.   Eyes:  Negative for redness.  Respiratory:  Negative for cough, shortness of breath and wheezing.   Gastrointestinal:  Negative for abdominal pain, diarrhea, nausea and vomiting.  Genitourinary:  Negative for dysuria.  Musculoskeletal:  Negative for myalgias and neck stiffness.  Skin:  Negative for rash.  Neurological:  Negative for headaches.  Hematological:  Negative for adenopathy.   Physical  Exam Updated Vital Signs BP 133/82   Pulse 88   Temp 98 F (36.7 C)   Resp 17   SpO2 98%   Physical Exam Vitals and nursing note reviewed.  Constitutional:      Appearance: She is well-developed.  HENT:     Head: Normocephalic and atraumatic.     Nose:     Comments: Right-sided epistaxis, blood dripping from the right nare. Eyes:     Conjunctiva/sclera: Conjunctivae normal.  Pulmonary:     Effort: No respiratory distress.  Musculoskeletal:     Cervical back: Normal range of motion and neck supple.  Skin:    General: Skin is warm and dry.  Neurological:     Mental Status: She is alert.    ED Results / Procedures / Treatments   Labs (all labs ordered are listed, but only abnormal results are displayed) Labs Reviewed  HEMOGLOBIN AND HEMATOCRIT, BLOOD - Abnormal; Notable for the following components:      Result Value   Hemoglobin 10.8 (*)  HCT 35.9 (*)    All other components within normal limits    EKG None  Radiology No results found.  Procedures Procedures   Medications Ordered in ED Medications  oxymetazoline (AFRIN) 0.05 % nasal spray 1 spray (has no administration in time range)    ED Course  I have reviewed the triage vital signs and the nursing notes.  Pertinent labs & imaging results that were available during my care of the patient were reviewed by me and considered in my medical decision making (see chart for details).  Patient seen and examined.  Will evacuate clot and place nasal packing.  Will check H&H given prolonged course of bleeding.   Vital signs reviewed and are as follows: BP (!) 167/95   Pulse 61   Temp 98 F (36.7 C)   Resp 16   SpO2 100%   7:58 AM I attempted to place nasal packing twice.  First was with 4.5 cm rapid Rhino soaked with Afrin, inflated to 5 cc.  Over couple minutes the packing worked itself out and needed to be removed.  I then used a 5.5 cm rapid Rhino without balloon.  I soaked this in saline and inserted  fully into the right nare.  Again, relatively quickly, worked itself out of the nare.  Discussed with Dr. Ronnald Nian and asked him to see patient.  Patient continues to have bleeding after packing removal.  10:30 AM nasal packing by Dr. Ronnald Nian.  7.5 cm packing placed.  This was monitored over the course of the past couple of hours.  At current time, patient has since mostly serous, very lightly blood-tinged drainage from the right nare.  Her hemoglobin is 10.8.  This is improved from previous hospitalization where she was into the 7s and required IV iron infusion.  At this time, feel comfortable with discharge.  Patient will hold her anticoagulation until she sees ENT on 7/27.  Encouraged return with worsening symptoms, recurrent nosebleeding, uncontrolled bleeding, lightheadedness or syncope.  She verbalizes understanding agrees with plan.    MDM Rules/Calculators/A&P                           Patient with epistaxis, recent admission for same, recurrent over the past 2 days.  Symptoms reasonably controlled now.  Hemoglobin in the tens compared to sevens on previous admission.  Patient will hold anticoagulation over the next several days.  She has scheduled ENT appointment in 3 days.  Encouraged return with worsening.   Final Clinical Impression(s) / ED Diagnoses Final diagnoses:  Right-sided epistaxis    Rx / DC Orders ED Discharge Orders     None        Carlisle Cater, PA-C 03/08/21 [place], [place], DO 03/08/21 1234

## 2021-03-08 NOTE — ED Provider Notes (Signed)
I personally evaluated the patient during the encounter and completed a history, physical, procedures, medical decision making to contribute to the overall care of the patient and decision making for the patient briefly, the patient is a 65 y.o. female is here with nosebleed for the last day or 2.  Intermittent bleeding.  She is on Eliquis for atrial flutter and peripheral arterial disease.  Packing has been placed twice by my physician associate with no success.  Has some fairly brisk bleeding on exam.  Packed the right nare with 7.5 cm Rhino Rocket.  Bleeding has improved.  Will monitor and reevaluate.  If this packing does not achieve hemostasis we will contact ENT.  Please see PA note for further results, evaluation, disposition of the patient.  Does not appear to have any evidence of posterior bleeding on my examination.  This chart was dictated using voice recognition software.  Despite best efforts to proofread,  errors can occur which can change the documentation meaning.   Marland KitchenEpistaxis Management  Date/Time: 03/08/2021 10:09 AM Performed by: Lennice Sites, DO Authorized by: Lennice Sites, DO   Consent:    Consent obtained:  Verbal   Consent given by:  Patient   Risks, benefits, and alternatives were discussed: yes     Risks discussed:  Bleeding, infection, nasal injury and pain   Alternatives discussed:  No treatment and delayed treatment Universal protocol:    Procedure explained and questions answered to patient or proxy's satisfaction: yes     Relevant documents present and verified: yes     Patient identity confirmed:  Verbally with patient Anesthesia:    Anesthesia method:  None Procedure details:    Treatment site: right sided.   Repair method: nasal packing, rhino rocket.   Treatment complexity:  Extensive   Treatment episode: recurring   Post-procedure details:    Assessment:  Bleeding decreased   Procedure completion:  Hassel Neth, DO 03/08/21  1010

## 2021-03-08 NOTE — Progress Notes (Deleted)
Cardiology Office Note:    Date:  03/08/2021   ID:  Veronica Stewart, DOB 10/09/1955, MRN [id number]  PCP:  Veronica Perna, NP  Cardiologist:  Donato Heinz, MD  Electrophysiologist:  None   Referring MD: Veronica Perna, NP   No chief complaint on file. ***  History of Present Illness:    Veronica Stewart is a 65 y.o. female with a hx of atrial flutter, PAD status post stent pop bypass, hypertension, asthma who presents for follow-up.  She was admitted to [facility] on 02/11/2021 with epistaxis and new onset atrial flutter.  Anticoagulation was initially held due to her epistaxis and she was evaluated by ENT.  Underwent packing in epistaxis resolved, and she was started on Eliquis, which he tolerated well.  She had been on aspirin/Plavix for her PAD given her left external iliac stenting in August 2021.  She was transitioned to Eliquis monotherapy.  Echocardiogram 02/13/2020 showed EF 40 to 45%.  Since discharge from the hospital, ?Cor CTA  Past Medical History:  Diagnosis Date   Asthma    Hypertension     Past Surgical History:  Procedure Laterality Date   ABDOMINAL AORTOGRAM W/LOWER EXTREMITY Bilateral 03/05/2020   Procedure: ABDOMINAL AORTOGRAM W/LOWER EXTREMITY;  Surgeon: Veronica Heck, MD;  Location: [place] [facility];  Service: Cardiovascular;  Laterality: Bilateral;   ENDARTERECTOMY FEMORAL Left 03/24/2020   Procedure: LEFT COMMON FEMORAL ENDARTERECTOMY;  Surgeon: Veronica Heck, MD;  Location: [place];  Service: Vascular;  Laterality: Left;   ENDOVEIN HARVEST OF GREATER SAPHENOUS VEIN Left 03/24/2020   Procedure: HARVEST OF GREATER SAPHENOUS VEIN;  Surgeon: Veronica Heck, MD;  Location: [place];  Service: Vascular;  Laterality: Left;   FEMORAL-POPLITEAL BYPASS GRAFT Left 03/24/2020   Procedure: BYPASS GRAFT FEMORAL-POPLITEAL ARTERY;  Surgeon: Veronica Heck, MD;  Location: [place];  Service: Vascular;  Laterality: Left;   INSERTION OF ILIAC STENT  Left 03/24/2020   Procedure: INSERTION OF RETROGRADE ILIAC STENT;  Surgeon: Veronica Heck, MD;  Location: [place];  Service: Vascular;  Laterality: Left;   INTRAMEDULLARY (IM) NAIL INTERTROCHANTERIC Left 03/18/2018   Procedure: INTRAMEDULLARY (IM) NAIL INTERTROCHANTRIC;  Surgeon: Veronica Needles, MD;  Location: [place];  Service: Orthopedics;  Laterality: Left;   INTRAOPERATIVE ARTERIOGRAM Left 03/24/2020   Procedure: INTRA OPERATIVE ARTERIOGRAM;  Surgeon: Veronica Heck, MD;  Location: [place];  Service: Vascular;  Laterality: Left;    Current Medications: No outpatient medications have been marked as taking for the 03/11/21 encounter (Appointment) with Donato Heinz, MD.   Current Facility-Administered Medications for the 03/11/21 encounter (Appointment) with Donato Heinz, MD  Medication   albuterol (PROVENTIL) (2.5 MG/3ML) 0.083% nebulizer solution 2.5 mg     Allergies:   Patient has no known allergies.   Social History   Socioeconomic History   Marital status: Legally Separated    Spouse name: Not on file   Number of children: Not on file   Years of education: Not on file   Highest education level: Not on file  Occupational History   Not on file  Tobacco Use   Smoking status: Every Day    Packs/day: 0.50    Types: Cigarettes   Smokeless tobacco: Never  Vaping Use   Vaping Use: Never used  Substance and Sexual Activity   Alcohol use: Yes    Comment: two 40 oz beers most days    Drug use: Not Currently    Types: Marijuana  Sexual activity: Not on file  Other Topics Concern   Not on file  Social History Narrative   Not on file   Social Determinants of Health   Financial Resource Strain: Not on file  Food Insecurity: Not on file  Transportation Needs: Not on file  Physical Activity: Not on file  Stress: Not on file  Social Connections: Not on file     Family History: The patient's ***family history is negative for Sudden Cardiac  Death.  ROS:   Please see the history of present illness.    *** All other systems reviewed and are negative.  EKGs/Labs/Other Studies Reviewed:    The following studies were reviewed today: ***  EKG:  EKG is *** ordered today.  The ekg ordered today demonstrates ***  Recent Labs: 02/11/2021: TSH 2.239 02/12/2021: ALT 8 02/16/2021: BUN 12; Creatinine, Ser 0.98; Magnesium 1.9; Platelets 358; Potassium 4.1; Sodium 129 03/08/2021: Hemoglobin 10.8  Recent Lipid Panel    Component Value Date/Time   CHOL 89 03/25/2020 0216   CHOL 173 03/26/2019 1450   TRIG 31 03/25/2020 0216   HDL 44 03/25/2020 0216   HDL 102 03/26/2019 1450   CHOLHDL 2.0 03/25/2020 0216   VLDL 6 03/25/2020 0216   LDLCALC 39 03/25/2020 0216   LDLCALC 48 03/26/2019 1450    Physical Exam:    VS:  There were no vitals taken for this visit.    Wt Readings from Last 3 Encounters:  03/07/21 104 lb (47.2 kg)  02/26/21 104 lb 12.8 oz (47.5 kg)  02/17/21 106 lb 0.7 oz (48.1 kg)     GEN: *** Well nourished, well developed in no acute distress HEENT: Normal NECK: No JVD; No carotid bruits LYMPHATICS: No lymphadenopathy CARDIAC: ***RRR, no murmurs, rubs, gallops RESPIRATORY:  Clear to auscultation without rales, wheezing or rhonchi  ABDOMEN: Soft, non-tender, non-distended MUSCULOSKELETAL:  No edema; No deformity  SKIN: Warm and dry NEUROLOGIC:  Alert and oriented x 3 PSYCHIATRIC:  Normal affect   ASSESSMENT:    No diagnosis found. PLAN:    Atrial Flutter: Noted during recent admission 01/2021, rates up to 140s.  CHADSVASc score 4 (CHF, HTN, PAD, female).  Echo shows EF 40 to 45% -continue toprol XL 50 mg daily -Given she was going in and out of atrial flutter during admission, was started on amiodarone to maintain sinus rhythm.  Maintained sinus with amnio, planned for amiodarone 200 mg twice daily x2 weeks then 200 mg daily x2 weeks, then discontinue -Continue Eliquis grams twice daily, appears to be  tolerating with her epistaxis history   Acute combined systolic and diastolic heart failure: Echo 02/13/2020 shows EF 40 to 45%.  New diagnosis heart failure.   -Given her PAD history, high suspicion for obstructive CAD, though she denies any issues with chest pain.  She will need ischemia evaluation.  Recommend starting with coronary CTA -Continue toprol XL 50 mg daily -Continue losartan 50 mg daily -Appears euvolemic   Epistaxis: reports long history of freq nose bleeds. Has had to have packing placed now with two separate episodes. Appears to have resolved.  Tolerating Eliquis   Hyponatremia: sodium down to 129 on 02/16/2021.  Will repeat BMP   HTN: on metoprolol and losartan as above   PAD s/p fem-pop bypass: with left external iliac stenting 8/21. Has been on ASA/plavix since that time.  During admission in June 2022, aspirin/Plavix were discontinued and switched to Eliquis monotherapy given her new atrial flutter as above  Anemia: in the setting of acute blood loss from nose bleed 01/2021. Hgb down to 7.5 during admission, improved to 10.8 on 03/09/2019  Medication Adjustments/Labs and Tests Ordered: Current medicines are reviewed at length with the patient today.  Concerns regarding medicines are outlined above.  No orders of the defined types were placed in this encounter.  No orders of the defined types were placed in this encounter.   There are no Patient Instructions on file for this visit.   Signed, Donato Heinz, MD  03/08/2021 3:30 PM    [place]

## 2021-03-08 NOTE — Discharge Instructions (Signed)
Please read and follow all provided instructions.  Your diagnoses today include:  1. Right-sided epistaxis     Tests performed today include: Hemoglobin - 10.8 today Vital signs. See below for your results today.   Medications prescribed:  None  Home care instructions:   Please do not take your blood thinner medications until you see your ENT doctor on Wednesday.  Please restart when they tell you to.   Read the educational materials provided and follow any instructions contained in this packet.  Follow-up instructions: Please follow-up with your ENT doctor as planned in 3 days.  Return instructions:  Please return to the Emergency Department if you experience worsening symptoms.  Please return with recurrent nosebleed. Please return if you have any other emergent concerns.  Additional Information:  Your vital signs today were: BP (!) 140/94   Pulse 62   Temp 98 F (36.7 C)   Resp 19   SpO2 100%  If your blood pressure (BP) was elevated above 135/85 this visit, please have this repeated by your doctor within one month. --------------

## 2021-03-08 NOTE — ED Notes (Signed)
EDP at bedside  

## 2021-03-09 NOTE — Progress Notes (Signed)
Cardiology Office Note:    Date:  03/11/2021   ID:  Veronica Stewart, DOB [birth year], MRN [id number]  PCP:  Kerin Perna, NP  Cardiologist:  Donato Heinz, MD  Electrophysiologist:  None   Referring MD: Kerin Perna, NP   Chief Complaint  Patient presents with   Congestive Heart Failure    History of Present Illness:    Veronica Stewart is a 65 y.o. female with a hx of atrial flutter, PAD status post stent pop bypass, hypertension, asthma who presents for follow-up.  She was admitted to [facility] on 02/11/2021 with epistaxis and new onset atrial flutter.  Anticoagulation was initially held due to her epistaxis and she was evaluated by ENT.  Underwent packing in epistaxis resolved, and she was started on Eliquis, which he tolerated well.  She had been on aspirin/Plavix for her PAD given her left external iliac stenting in August 2021.  She was transitioned to Eliquis monotherapy.  Echocardiogram 02/13/2020 showed EF 40 to 45%.  Since discharge from the hospital, she is doing OK. She had ED visit 03/08/2021 for epistaxis, Eliquis was held. She has a cold that she can feel in the middle of her chest but denies any chest pains or shortness of breath. Denies any lightheadedness, syncope, LE edema or palpitations.     Past Medical History:  Diagnosis Date   Asthma    Hypertension     Past Surgical History:  Procedure Laterality Date   ABDOMINAL AORTOGRAM W/LOWER EXTREMITY Bilateral 03/05/2020   Procedure: ABDOMINAL AORTOGRAM W/LOWER EXTREMITY;  Surgeon: Marty Heck, MD;  Location: [place] [facility];  Service: Cardiovascular;  Laterality: Bilateral;   ENDARTERECTOMY FEMORAL Left 03/24/2020   Procedure: LEFT COMMON FEMORAL ENDARTERECTOMY;  Surgeon: Marty Heck, MD;  Location: [place];  Service: Vascular;  Laterality: Left;   ENDOVEIN HARVEST OF GREATER SAPHENOUS VEIN Left 03/24/2020   Procedure: HARVEST OF GREATER SAPHENOUS VEIN;  Surgeon: Marty Heck,  MD;  Location: [place];  Service: Vascular;  Laterality: Left;   FEMORAL-POPLITEAL BYPASS GRAFT Left 03/24/2020   Procedure: BYPASS GRAFT FEMORAL-POPLITEAL ARTERY;  Surgeon: Marty Heck, MD;  Location: [place];  Service: Vascular;  Laterality: Left;   INSERTION OF ILIAC STENT Left 03/24/2020   Procedure: INSERTION OF RETROGRADE ILIAC STENT;  Surgeon: Marty Heck, MD;  Location: [place];  Service: Vascular;  Laterality: Left;   INTRAMEDULLARY (IM) NAIL INTERTROCHANTERIC Left 03/18/2018   Procedure: INTRAMEDULLARY (IM) NAIL INTERTROCHANTRIC;  Surgeon: Shona Needles, MD;  Location: [place];  Service: Orthopedics;  Laterality: Left;   INTRAOPERATIVE ARTERIOGRAM Left 03/24/2020   Procedure: INTRA OPERATIVE ARTERIOGRAM;  Surgeon: Marty Heck, MD;  Location: [facility];  Service: Vascular;  Laterality: Left;    Current Medications: Current Meds  Medication Sig   acetaminophen (TYLENOL) 500 MG tablet Take 500 mg by mouth every 6 (six) hours as needed for mild pain.   albuterol (VENTOLIN HFA) 108 (90 Base) MCG/ACT inhaler INHALE 2 PUFFS BY MOUTH EVERY 6 HOURS AS NEEDED FOR WHEEZING AND SHORTNESS OF BREATH   apixaban (ELIQUIS) 5 MG TABS tablet Take 1 tablet (5 mg total) by mouth 2 (two) times daily.   atorvastatin (LIPITOR) 20 MG tablet Take 1 tablet (20 mg total) by mouth daily.   clopidogrel (PLAVIX) 75 MG tablet Take 75 mg by mouth daily.   feeding supplement (ENSURE ENLIVE / ENSURE PLUS) LIQD Take 237 mLs by mouth 3 (three) times daily between meals.   Fluticasone-Salmeterol (ADVAIR)  100-50 MCG/DOSE AEPB Inhale 1 puff into the lungs 2 (two) times daily.   losartan (COZAAR) 50 MG tablet Take 1 tablet (50 mg total) by mouth daily.   metoprolol succinate (TOPROL-XL) 50 MG 24 hr tablet Take 1 tablet (50 mg total) by mouth daily. Take with or immediately following a meal.   PROLIA 60 MG/ML SOSY injection Inject 60 mg into the skin every 6 (six) months.   Vitamin D, Ergocalciferol, (DRISDOL) 1.25  MG (50000 UNIT) CAPS capsule Take 50,000 Units by mouth 2 (two) times a week. Monday and Thursday   [DISCONTINUED] amiodarone (PACERONE) 200 MG tablet Take 1 tablet (200 mg total) by mouth 2 (two) times daily for 14 days, THEN 1 tablet (200 mg total) daily for 14 days.   Current Facility-Administered Medications for the 03/11/21 encounter (Office Visit) with Donato Heinz, MD  Medication   albuterol (PROVENTIL) (2.5 MG/3ML) 0.083% nebulizer solution 2.5 mg     Allergies:   Patient has no known allergies.   Social History   Socioeconomic History   Marital status: Legally Separated    Spouse name: Not on file   Number of children: Not on file   Years of education: Not on file   Highest education level: Not on file  Occupational History   Not on file  Tobacco Use   Smoking status: Every Day    Packs/day: 0.50    Types: Cigarettes   Smokeless tobacco: Never  Vaping Use   Vaping Use: Never used  Substance and Sexual Activity   Alcohol use: Yes    Comment: two 40 oz beers most days    Drug use: Not Currently    Types: Marijuana   Sexual activity: Not on file  Other Topics Concern   Not on file  Social History Narrative   Not on file   Social Determinants of Health   Financial Resource Strain: Not on file  Food Insecurity: Not on file  Transportation Needs: Not on file  Physical Activity: Not on file  Stress: Not on file  Social Connections: Not on file     Family History: [name] is negative for Sudden Cardiac Death.  ROS:   Please see the history of present illness.    (+) nose bleeds (+) cold  All other systems reviewed and are negative.  EKGs/Labs/Other Studies Reviewed:    The following studies were reviewed today:   EKG:   07/22:sinus rhythm, rate 63, Q-wave V1-V4  Recent Labs: 02/11/2021: TSH 2.239 02/12/2021: ALT 8 02/16/2021: BUN 12; Creatinine, Ser 0.98; Magnesium 1.9; Platelets 358; Potassium 4.1; Sodium 129 03/08/2021:  Hemoglobin 10.8  Recent Lipid Panel    Component Value Date/Time   CHOL 89 03/25/2020 0216   CHOL 173 03/26/2019 1450   TRIG 31 03/25/2020 0216   HDL 44 03/25/2020 0216   HDL 102 03/26/2019 1450   CHOLHDL 2.0 03/25/2020 0216   VLDL 6 03/25/2020 0216   LDLCALC 39 03/25/2020 0216   LDLCALC 48 03/26/2019 1450    Physical Exam:    VS:  BP 122/66   Pulse 63   Resp 18   Ht '5\' 9"'$  (1.753 m)   Wt 104 lb 6.4 oz (47.4 kg)   SpO2 95%   BMI 15.42 kg/m     Wt Readings from Last 3 Encounters:  03/11/21 104 lb 6.4 oz (47.4 kg)  03/07/21 104 lb (47.2 kg)  02/26/21 104 lb 12.8 oz (47.5 kg)     GEN:  Well nourished, well developed in no acute distress HEENT: Normal NECK: No JVD; No carotid bruits LYMPHATICS: No lymphadenopathy CARDIAC: RRR, no murmurs, rubs, gallops RESPIRATORY:  Clear to auscultation without rales, wheezing or rhonchi  ABDOMEN: Soft, non-tender, non-distended MUSCULOSKELETAL:  No edema; No deformity  SKIN: Warm and dry NEUROLOGIC:  Alert and oriented x 3 PSYCHIATRIC:  Normal affect   ASSESSMENT:    1. Atrial flutter, unspecified type ([place])   2. Acute combined systolic and diastolic heart failure ([place])   3. Anemia, unspecified type   4. Epistaxis   5. Essential hypertension   6. Hyponatremia   7. PAD (peripheral artery disease) (HCC)    PLAN:    Atrial Flutter: Noted during recent admission 01/2021, rates up to 140s.  CHADSVASc score 4 (CHF, HTN, PAD, female).  Echo shows EF 40 to 45% -Continue toprol XL 50 mg daily -Given she was going in and out of atrial flutter during admission, was started on amiodarone to maintain sinus rhythm.  Maintained sinus with amio, will continue amio 200 mg daily -Eliquis currently on hold givenepistaxis untilshe follows with ENT   Acute combined systolic and diastolic heart failure: Echo 02/13/2020 shows EF 40 to 45%.  New diagnosis heart failure.   -Given her PAD history, high suspicion for obstructive CAD, though she denies  any issues with chest pain.  She will need ischemia evaluation.  Recommend starting with coronary CTA -Continue toprol XL 50 mg daily -Continue losartan 50 mg daily -Check CMET.  Add aldactone if stable potassium/renal function -Appears euvolemic   Epistaxis: reports long history of freq nose bleeds. Has had to have packing placed now with three recent episodes. Has appointment with ENT, Eliquis being held until cleared by ENT    Hyponatremia: sodium down to 129 on 02/16/2021.  Will repeat CMP   HTN: on metoprolol and losartan as above, appears controlled   PAD s/p fem-pop bypass: with left external iliac stenting 8/21. Has been on ASA/plavix since that time.  During admission in June 2022, aspirin/Plavix were discontinued and switched to Eliquis monotherapy given her new atrial flutter as above    Anemia: in the setting of acute blood loss from nose bleed 01/2021. Hgb down to 7.5 during admission, improved to 10.8 on 03/09/2019.  Will check CBC  RTC in 6-8 weeks   Medication Adjustments/Labs and Tests Ordered: Current medicines are reviewed at length with the patient today.  Concerns regarding medicines are outlined above.  Orders Placed This Encounter  Procedures   CT CORONARY MORPH W/CTA COR W/SCORE W/CA W/CM &/OR WO/CM   Comprehensive metabolic panel   CBC   EKG 12-Lead    Meds ordered this encounter  Medications   amiodarone (PACERONE) 200 MG tablet    Sig: Take 1 tablet (200 mg total) by mouth daily.    Dispense:  90 tablet    Refill:  1     Patient Instructions  Medication Instructions:  Your physician recommends that you continue on your current medications as directed. Please refer to the Current Medication list given to you today.  *If you need a refill on your cardiac medications before your next appointment, please call your pharmacy*   Lab Work: CMET, CBC today  If you have labs (blood work) drawn today and your tests are completely normal, you will receive  your results only by: [place] (if you have MyChart) OR A paper copy in the mail If you have any lab test that is abnormal or we  need to change your treatment, we will call you to review the results.   Testing/Procedures: Coronary CTA-see instructions below  Follow-Up: At [facility], you and your health needs are our priority.  As part of our continuing mission to provide you with exceptional heart care, we have created designated Provider Care Teams.  These Care Teams include your primary Cardiologist (physician) and Advanced Practice Providers (APPs -  Physician Assistants and Nurse Practitioners) who all work together to provide you with the care you need, when you need it.  We recommend signing up for the patient portal called "MyChart".  Sign up information is provided on this After Visit Summary.  MyChart is used to connect with patients for Virtual Visits (Telemedicine).  Patients are able to view lab/test results, encounter notes, upcoming appointments, etc.  Non-urgent messages can be sent to your provider as well.   To learn more about what you can do with MyChart, go to [email or web address].    Your next appointment:   Monday, 9/26 at 10:20 AM with Dr. Gardiner Rhyme   Other Instructions   Your cardiac CT will be scheduled at one of the below locations:   [facility] [address] [place], [place] [address] [phone]  If scheduled at [facility], please arrive at the [facility] main entrance (entrance A) of [facility] 30 minutes prior to test start time. Proceed to the [facility] Radiology Department (first floor) to check-in and test prep.  Please follow these instructions carefully (unless otherwise directed):  On the Night Before the Test: Be sure to Drink plenty of water. Do not consume any caffeinated/decaffeinated beverages or chocolate 12 hours prior to your test. Do not take any antihistamines 12 hours prior to your  test.  On the Day of the Test: Drink plenty of water until 1 hour prior to the test. Do not eat any food 4 hours prior to the test. You may take your regular medications prior to the test.  FEMALES- please wear underwire-free bra if available, avoid dresses & tight clothing      After the Test: Drink plenty of water. After receiving IV contrast, you may experience a mild flushed feeling. This is normal. On occasion, you may experience a mild rash up to 24 hours after the test. This is not dangerous. If this occurs, you can take Benadryl 25 mg and increase your fluid intake. If you experience trouble breathing, this can be serious. If it is severe call 911 IMMEDIATELY. If it is mild, please call our office. If you take any of these medications: Glipizide/Metformin, Avandament, Glucavance, please do not take 48 hours after completing test unless otherwise instructed.  Please allow 2-4 weeks for scheduling of routine cardiac CTs. Some insurance companies require a pre-authorization which may delay scheduling of this test.   For non-scheduling related questions, please contact the cardiac imaging nurse navigator should you have any questions/concerns: Marchia Bond, Cardiac Imaging Nurse Navigator Gordy Clement, Cardiac Imaging Nurse Navigator [facility] Direct Office Dial: [phone]   For scheduling needs, including cancellations and rescheduling, please call [place], [phone].    I,Jada Bradford,acting as a Education administrator for Donato Heinz, MD.,have documented all relevant documentation on the behalf of Donato Heinz, MD,as directed by  Donato Heinz, MD while in the presence of Donato Heinz, MD.  I, Donato Heinz, MD, have reviewed all documentation for this visit. The documentation on 03/11/21 for the exam, diagnosis, procedures,  and orders are all accurate and complete.   Signed, Donato Heinz, MD   03/11/2021 8:29 AM    [facility]

## 2021-03-11 ENCOUNTER — Ambulatory Visit (INDEPENDENT_AMBULATORY_CARE_PROVIDER_SITE_OTHER): Payer: Medicaid Other | Admitting: Cardiology

## 2021-03-11 ENCOUNTER — Other Ambulatory Visit: Payer: Self-pay

## 2021-03-11 VITALS — BP 122/66 | HR 63 | Resp 18 | Ht 69.0 in | Wt 104.4 lb

## 2021-03-11 DIAGNOSIS — R040 Epistaxis: Secondary | ICD-10-CM

## 2021-03-11 DIAGNOSIS — I4892 Unspecified atrial flutter: Secondary | ICD-10-CM

## 2021-03-11 DIAGNOSIS — I5041 Acute combined systolic (congestive) and diastolic (congestive) heart failure: Secondary | ICD-10-CM

## 2021-03-11 DIAGNOSIS — E871 Hypo-osmolality and hyponatremia: Secondary | ICD-10-CM

## 2021-03-11 DIAGNOSIS — I10 Essential (primary) hypertension: Secondary | ICD-10-CM

## 2021-03-11 DIAGNOSIS — D649 Anemia, unspecified: Secondary | ICD-10-CM

## 2021-03-11 DIAGNOSIS — I739 Peripheral vascular disease, unspecified: Secondary | ICD-10-CM

## 2021-03-11 LAB — COMPREHENSIVE METABOLIC PANEL
ALT: 7 IU/L (ref 0–32)
AST: 12 IU/L (ref 0–40)
Albumin/Globulin Ratio: 1 — ABNORMAL LOW (ref 1.2–2.2)
Albumin: 4 g/dL (ref 3.8–4.8)
Alkaline Phosphatase: 76 IU/L (ref 44–121)
BUN/Creatinine Ratio: 12 (ref 12–28)
BUN: 16 mg/dL (ref 8–27)
Bilirubin Total: 0.3 mg/dL (ref 0.0–1.2)
CO2: 21 mmol/L (ref 20–29)
Calcium: 9.8 mg/dL (ref 8.7–10.3)
Chloride: 89 mmol/L — ABNORMAL LOW (ref 96–106)
Creatinine, Ser: 1.36 mg/dL — ABNORMAL HIGH (ref 0.57–1.00)
Globulin, Total: 4 g/dL (ref 1.5–4.5)
Glucose: 90 mg/dL (ref 65–99)
Potassium: 4.8 mmol/L (ref 3.5–5.2)
Sodium: 128 mmol/L — ABNORMAL LOW (ref 134–144)
Total Protein: 8 g/dL (ref 6.0–8.5)
eGFR: 43 mL/min/{1.73_m2} — ABNORMAL LOW (ref >59)

## 2021-03-11 LAB — CBC
Hematocrit: 32.1 % — ABNORMAL LOW (ref 34.0–46.6)
Hemoglobin: 9.7 g/dL — ABNORMAL LOW (ref 11.1–15.9)
MCH: 23.7 pg — ABNORMAL LOW (ref 26.6–33.0)
MCHC: 30.2 g/dL — ABNORMAL LOW (ref 31.5–35.7)
MCV: 79 fL (ref 79–97)
Platelets: 325 10*3/uL (ref 150–450)
RBC: 4.09 x10E6/uL (ref 3.77–5.28)
RDW: 25.5 % — ABNORMAL HIGH (ref 11.7–15.4)
WBC: 7.6 10*3/uL (ref 3.4–10.8)

## 2021-03-11 MED ORDER — AMIODARONE HCL 200 MG PO TABS
200.0000 mg | ORAL_TABLET | Freq: Every day | ORAL | 1 refills | Status: DC
Start: 2021-03-11 — End: 2022-03-16

## 2021-03-11 NOTE — Patient Instructions (Signed)
Medication Instructions:  Your physician recommends that you continue on your current medications as directed. Please refer to the Current Medication list given to you today.  *If you need a refill on your cardiac medications before your next appointment, please call your pharmacy*   Lab Work: CMET, CBC today  If you have labs (blood work) drawn today and your tests are completely normal, you will receive your results only by: [place] (if you have MyChart) OR A paper copy in the mail If you have any lab test that is abnormal or we need to change your treatment, we will call you to review the results.   Testing/Procedures: Coronary CTA-see instructions below  Follow-Up: At [facility], you and your health needs are our priority.  As part of our continuing mission to provide you with exceptional heart care, we have created designated Provider Care Teams.  These Care Teams include your primary Cardiologist (physician) and Advanced Practice Providers (APPs -  Physician Assistants and Nurse Practitioners) who all work together to provide you with the care you need, when you need it.  We recommend signing up for the patient portal called "MyChart".  Sign up information is provided on this After Visit Summary.  MyChart is used to connect with patients for Virtual Visits (Telemedicine).  Patients are able to view lab/test results, encounter notes, upcoming appointments, etc.  Non-urgent messages can be sent to your provider as well.   To learn more about what you can do with MyChart, go to [email or web address].    Your next appointment:   Monday, 9/26 at 10:20 AM with Dr. Gardiner Rhyme   Other Instructions   Your cardiac CT will be scheduled at one of the below locations:   [facility] [address] [place], [place] [address] [phone]  If scheduled at [facility], please arrive at the [facility] main entrance (entrance A) of [facility] 30  minutes prior to test start time. Proceed to the [facility] Radiology Department (first floor) to check-in and test prep.  Please follow these instructions carefully (unless otherwise directed):  On the Night Before the Test: Be sure to Drink plenty of water. Do not consume any caffeinated/decaffeinated beverages or chocolate 12 hours prior to your test. Do not take any antihistamines 12 hours prior to your test.  On the Day of the Test: Drink plenty of water until 1 hour prior to the test. Do not eat any food 4 hours prior to the test. You may take your regular medications prior to the test.  FEMALES- please wear underwire-free bra if available, avoid dresses & tight clothing      After the Test: Drink plenty of water. After receiving IV contrast, you may experience a mild flushed feeling. This is normal. On occasion, you may experience a mild rash up to 24 hours after the test. This is not dangerous. If this occurs, you can take Benadryl 25 mg and increase your fluid intake. If you experience trouble breathing, this can be serious. If it is severe call 911 IMMEDIATELY. If it is mild, please call our office. If you take any of these medications: Glipizide/Metformin, Avandament, Glucavance, please do not take 48 hours after completing test unless otherwise instructed.  Please allow 2-4 weeks for scheduling of routine cardiac CTs. Some insurance companies require a pre-authorization which may delay scheduling of this test.   For non-scheduling related questions, please contact the cardiac imaging nurse navigator should you have any questions/concerns: Clarise Cruz  Juleen China, Cardiac Imaging Nurse Navigator Gordy Clement, Cardiac Imaging Nurse Navigator [facility] Direct Office Dial: [phone]   For scheduling needs, including cancellations and rescheduling, please call [place], [phone].

## 2021-03-12 ENCOUNTER — Other Ambulatory Visit: Payer: Self-pay | Admitting: *Deleted

## 2021-03-12 DIAGNOSIS — I5041 Acute combined systolic (congestive) and diastolic (congestive) heart failure: Secondary | ICD-10-CM

## 2021-03-12 DIAGNOSIS — Z79899 Other long term (current) drug therapy: Secondary | ICD-10-CM

## 2021-03-12 DIAGNOSIS — E871 Hypo-osmolality and hyponatremia: Secondary | ICD-10-CM

## 2021-03-12 MED ORDER — LOSARTAN POTASSIUM 50 MG PO TABS: 50.0000 mg | ORAL_TABLET | Freq: Every day | ORAL | 0 refills | Status: DC

## 2021-03-13 ENCOUNTER — Telehealth: Payer: Self-pay | Admitting: Surgery

## 2021-03-13 NOTE — Telephone Encounter (Signed)
ED RN Care Manager received call from patient concerning referral from the ED to ENT for f/u. RN  Care manager attempted to call to schedule ENT appt with [facility] provided patient with contact information [phone] to schedule appointment Monday 8/1 after 8am

## 2021-03-17 DIAGNOSIS — Z7901 Long term (current) use of anticoagulants: Secondary | ICD-10-CM | POA: Insufficient documentation

## 2021-03-17 DIAGNOSIS — J3489 Other specified disorders of nose and nasal sinuses: Secondary | ICD-10-CM | POA: Insufficient documentation

## 2021-03-19 ENCOUNTER — Other Ambulatory Visit: Payer: Self-pay

## 2021-03-19 DIAGNOSIS — E871 Hypo-osmolality and hyponatremia: Secondary | ICD-10-CM

## 2021-03-19 DIAGNOSIS — I5041 Acute combined systolic (congestive) and diastolic (congestive) heart failure: Secondary | ICD-10-CM

## 2021-03-19 DIAGNOSIS — Z79899 Other long term (current) drug therapy: Secondary | ICD-10-CM

## 2021-03-19 LAB — BASIC METABOLIC PANEL
BUN/Creatinine Ratio: 10 — ABNORMAL LOW (ref 12–28)
BUN: 12 mg/dL (ref 8–27)
CO2: 24 mmol/L (ref 20–29)
Calcium: 9.9 mg/dL (ref 8.7–10.3)
Chloride: 94 mmol/L — ABNORMAL LOW (ref 96–106)
Creatinine, Ser: 1.19 mg/dL — ABNORMAL HIGH (ref 0.57–1.00)
Glucose: 76 mg/dL (ref 65–99)
Potassium: 5.4 mmol/L — ABNORMAL HIGH (ref 3.5–5.2)
Sodium: 135 mmol/L (ref 134–144)
eGFR: 51 mL/min/{1.73_m2} — ABNORMAL LOW (ref >59)

## 2021-03-26 ENCOUNTER — Other Ambulatory Visit: Payer: Self-pay

## 2021-03-26 DIAGNOSIS — I5041 Acute combined systolic (congestive) and diastolic (congestive) heart failure: Secondary | ICD-10-CM

## 2021-03-26 DIAGNOSIS — Z79899 Other long term (current) drug therapy: Secondary | ICD-10-CM

## 2021-03-27 LAB — BASIC METABOLIC PANEL
BUN/Creatinine Ratio: 11 — ABNORMAL LOW (ref 12–28)
BUN: 10 mg/dL (ref 8–27)
CO2: 24 mmol/L (ref 20–29)
Calcium: 9.9 mg/dL (ref 8.7–10.3)
Chloride: 95 mmol/L — ABNORMAL LOW (ref 96–106)
Creatinine, Ser: 0.87 mg/dL (ref 0.57–1.00)
Glucose: 72 mg/dL (ref 65–99)
Potassium: 5.3 mmol/L — ABNORMAL HIGH (ref 3.5–5.2)
Sodium: 137 mmol/L (ref 134–144)
eGFR: 74 mL/min/{1.73_m2} (ref >59)

## 2021-04-13 ENCOUNTER — Telehealth (HOSPITAL_COMMUNITY): Payer: Self-pay | Admitting: Emergency Medicine

## 2021-04-13 NOTE — Telephone Encounter (Signed)
Unable to leave vm.

## 2021-04-14 ENCOUNTER — Ambulatory Visit (HOSPITAL_COMMUNITY)
Admission: RE | Admit: 2021-04-14 | Discharge: 2021-04-14 | Disposition: A | Discharge status: Home / Self Care | Payer: Medicaid Other | Source: Ambulatory Visit | Attending: Cardiology | Admitting: Cardiology

## 2021-04-14 ENCOUNTER — Other Ambulatory Visit: Payer: Self-pay

## 2021-04-14 ENCOUNTER — Other Ambulatory Visit: Payer: Self-pay | Admitting: Cardiology

## 2021-04-14 DIAGNOSIS — I5041 Acute combined systolic (congestive) and diastolic (congestive) heart failure: Secondary | ICD-10-CM | POA: Insufficient documentation

## 2021-04-14 DIAGNOSIS — R931 Abnormal findings on diagnostic imaging of heart and coronary circulation: Secondary | ICD-10-CM | POA: Diagnosis present

## 2021-04-14 DIAGNOSIS — I2510 Atherosclerotic heart disease of native coronary artery without angina pectoris: Secondary | ICD-10-CM | POA: Diagnosis not present

## 2021-04-14 IMAGING — CT CT HEART MORP W/ CTA COR W/ SCORE W/ CA W/CM &/OR W/O CM
4 of 7 series · 8 of 20 positions shown, 9 images · IV contrast (APPLIED)
Comparison: None.
COMPARISON: None.

Addendum:
EXAM:
OVER-READ INTERPRETATION  CT CHEST

The following report is an over-read performed by radiologist Dr.
Kaafu Inn Obj Mon [REDACTED] on 04/14/2021. This
over-read does not include interpretation of cardiac or coronary
anatomy or pathology. The coronary calcium score/coronary CTA
interpretation by the cardiologist is attached.
TECHNIQUE: The patient was scanned on a Phillips Force scanner.

[Series 6: best diast · axial · 0.33mm/px · z∈[-174,-128]mm · 2 of 340 slices shown, 3 images]
[im 114/340  vessel]
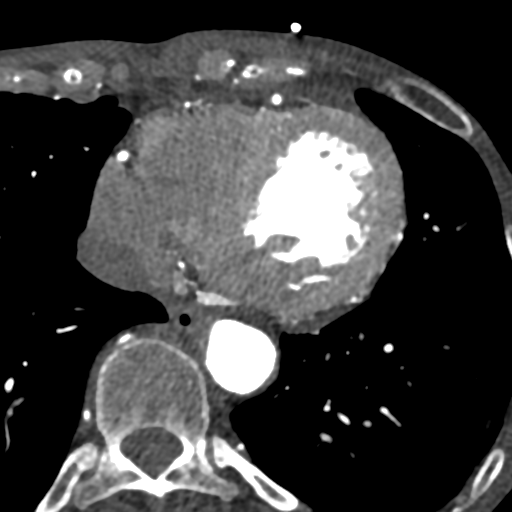
[im 114/340  lung]
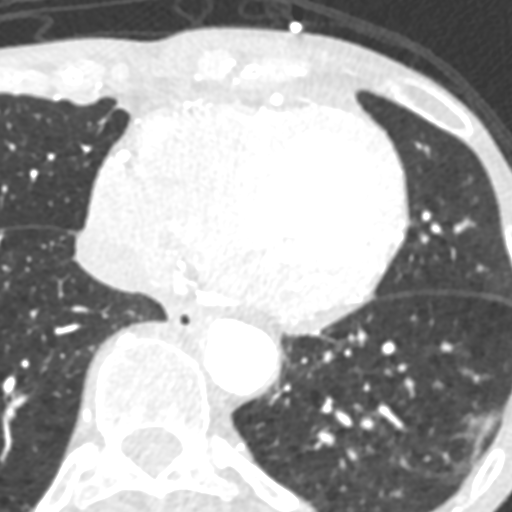
[im 227/340  vessel]
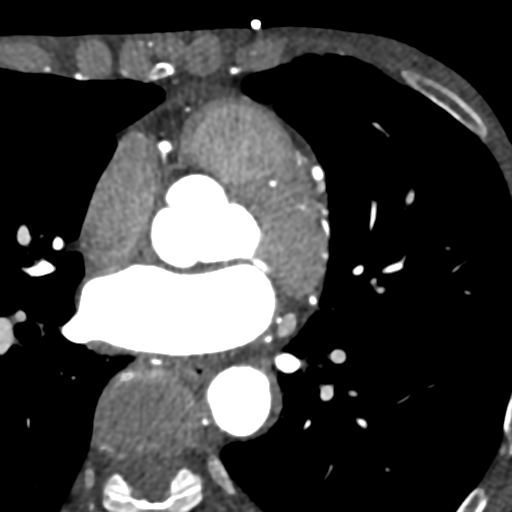

[Series 7: best syst · axial · 0.33mm/px · z∈[-174,-128]mm · 2 of 340 slices shown]
[im 114/340  vessel]
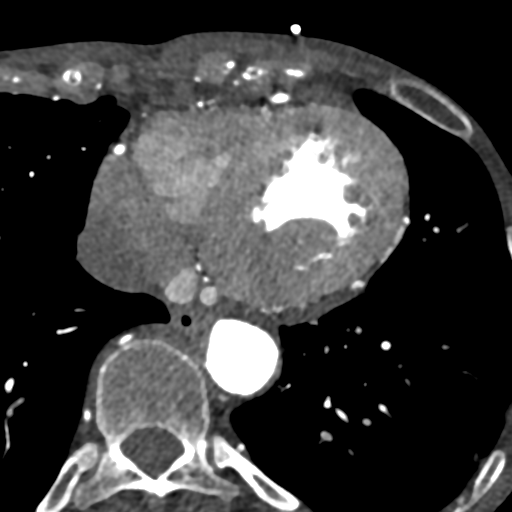
[im 227/340  vessel]
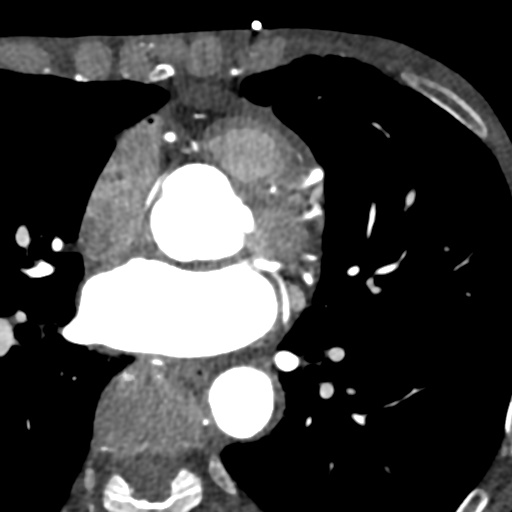

[Series 8: ts diast · axial · 0.33mm/px · z∈[-174,-128]mm · 2 of 340 slices shown]
[im 114/340  vessel]
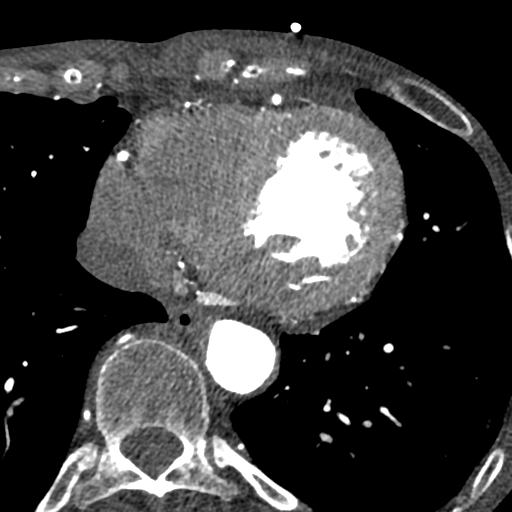
[im 227/340  vessel]
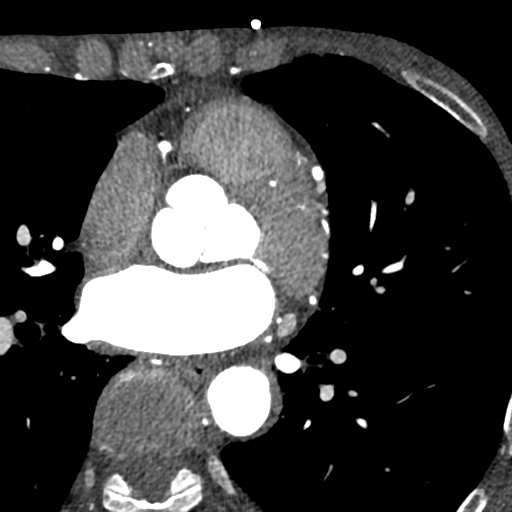

[Series 9: ts syst · axial · 0.33mm/px · z∈[-174,-128]mm · 2 of 340 slices shown]
[im 114/340  vessel]
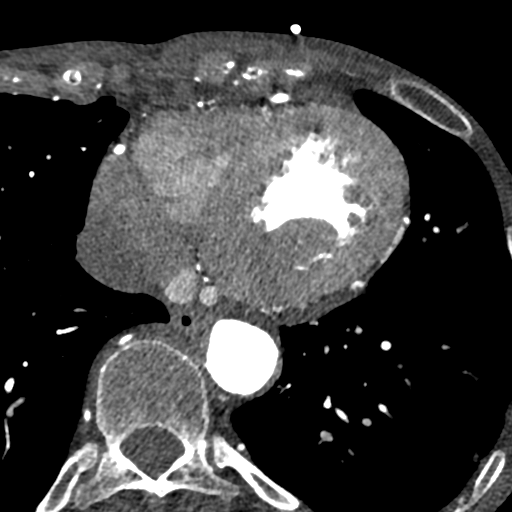
[im 227/340  vessel]
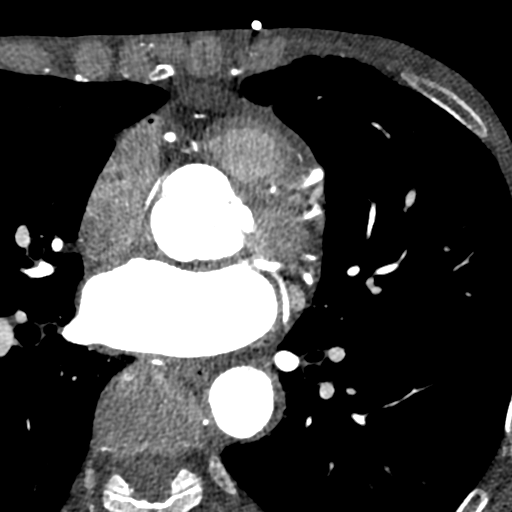

[8 of 20 positions shown; findings below may reference images not displayed]

FINDINGS: Atherosclerotic calcifications in the thoracic aorta. Diffuse
bronchial wall thickening with moderate centrilobular and paraseptal
emphysema. Large bulla associated with the left major fissure
incompletely imaged. Within the visualized portions of the thorax
there are no suspicious appearing pulmonary nodules or masses, there
is no acute consolidative airspace disease, no pleural effusions, no
pneumothorax and no lymphadenopathy. Visualized portions of the
upper abdomen are unremarkable. There are no aggressive appearing
lytic or blastic lesions noted in the visualized portions of the
skeleton.
IMPRESSION: 1. Aortic Atherosclerosis (OOUBC-VLP.P) and Emphysema (OOUBC-RGE.M).

EXAM:
Cardiac/Coronary  CT
FINDINGS: A 120 kV prospective scan was triggered in the descending thoracic
aorta at 111 HU's. Axial non-contrast 3 mm slices were carried out
through the heart. The data set was analyzed on a dedicated work
station and scored using the Agatson method. Gantry rotation speed
was 250 msecs and collimation was .6 mm. No beta blockade and 0.8 mg
of sl NTG was given. The 3D data set was reconstructed in 5%
intervals of the 67-82 % of the R-R cycle. Diastolic phases were
analyzed on a dedicated work station using MPR, MIP and VRT modes.
The patient received 80 cc of contrast.

Aorta: Normal size. Scattered calcifications of ascending and
descending aorta. No dissection.

Aortic Valve:  Trileaflet.  No calcifications.

Coronary Arteries:  Normal coronary origin.  Right dominance.

RCA is a large dominant artery that gives rise to PDA and PLVB.
There is mild calcified plaque in the ostial and proximal RCA with
associated stenosis of 25-49%. There is moderate soft plaque in the
mid RCA with associated stenosis of 50-69%.

Left main is a large artery that gives rise to LAD, Ramus and LCX
arteries. There is no plaque.

Ramus is a moderate sized vessel with no plaque.

LAD is a large vessel that gives rise to a very large Diagonal.
There is moderate mixed plaque in the proximal LAD and proximal
large D1 with associated stenosis of 50-69%.

LCX is a non-dominant artery that gives rise to one small OM1 and
large OM2 branches. There is no plaque.

Other findings:

Normal pulmonary vein drainage into the left atrium.

Normal let atrial appendage without a thrombus.

Normal size of the pulmonary artery.
IMPRESSION: 1. Coronary calcium score of 234. This was 93rd percentile for age
and sex matched control.

2.  Normal coronary origin with right dominance.

3.  Moderate atherosclerosis.  CAD RADS 3.

4. Consider symptom-guided anti-ischemic and preventive
pharmacotherapy as well as risk factor modification per
guideline-directed care.

5.  This study has been submitted for FFR analysis.

Bipinkumar Decastro

*** End of Addendum ***
EXAM:
OVER-READ INTERPRETATION  CT CHEST

The following report is an over-read performed by radiologist Dr.
Kaafu Inn Obj Mon [REDACTED] on 04/14/2021. This
over-read does not include interpretation of cardiac or coronary
anatomy or pathology. The coronary calcium score/coronary CTA
interpretation by the cardiologist is attached.
FINDINGS: Atherosclerotic calcifications in the thoracic aorta. Diffuse
bronchial wall thickening with moderate centrilobular and paraseptal
emphysema. Large bulla associated with the left major fissure
incompletely imaged. Within the visualized portions of the thorax
there are no suspicious appearing pulmonary nodules or masses, there
is no acute consolidative airspace disease, no pleural effusions, no
pneumothorax and no lymphadenopathy. Visualized portions of the
upper abdomen are unremarkable. There are no aggressive appearing
lytic or blastic lesions noted in the visualized portions of the
skeleton.
IMPRESSION: 1. Aortic Atherosclerosis (OOUBC-VLP.P) and Emphysema (OOUBC-RGE.M).

## 2021-04-14 MED ORDER — NITROGLYCERIN 0.4 MG SL SUBL
SUBLINGUAL_TABLET | SUBLINGUAL | Status: AC
  Filled 2021-04-14: qty 2

## 2021-04-14 MED ORDER — DILTIAZEM HCL 25 MG/5ML IV SOLN
INTRAVENOUS | Status: AC
  Filled 2021-04-14: qty 5

## 2021-04-14 MED ORDER — NITROGLYCERIN 0.4 MG SL SUBL
0.8000 mg | SUBLINGUAL_TABLET | Freq: Once | SUBLINGUAL | Status: AC
  Administered 2021-04-14: 0.8 mg via SUBLINGUAL

## 2021-04-14 MED ORDER — METOPROLOL TARTRATE 5 MG/5ML IV SOLN
INTRAVENOUS | Status: AC
  Administered 2021-04-14: 5 mg via INTRAVENOUS
  Filled 2021-04-14: qty 15

## 2021-04-14 MED ORDER — DILTIAZEM HCL 25 MG/5ML IV SOLN: 10.0000 mg | Freq: Once | INTRAVENOUS | Status: DC

## 2021-04-14 MED ORDER — METOPROLOL TARTRATE 5 MG/5ML IV SOLN
5.0000 mg | INTRAVENOUS | Status: DC | PRN
  Administered 2021-04-14: 5 mg via INTRAVENOUS

## 2021-04-14 MED ORDER — IOHEXOL 350 MG/ML SOLN
100.0000 mL | Freq: Once | INTRAVENOUS | Status: AC | PRN
  Administered 2021-04-14: 100 mL via INTRAVENOUS

## 2021-04-15 DIAGNOSIS — I2510 Atherosclerotic heart disease of native coronary artery without angina pectoris: Secondary | ICD-10-CM | POA: Diagnosis not present

## 2021-04-16 ENCOUNTER — Ambulatory Visit: Payer: Medicaid Other | Admitting: Podiatry

## 2021-05-03 DIAGNOSIS — C300 Malignant neoplasm of nasal cavity: Secondary | ICD-10-CM | POA: Insufficient documentation

## 2021-05-04 ENCOUNTER — Ambulatory Visit: Payer: Medicaid Other | Admitting: Podiatry

## 2021-05-10 NOTE — Progress Notes (Signed)
Cardiology Office Note:    Date:  05/11/2021   ID:  Mardella Nuckles, DOB 1956-04-06, MRN [id number]  PCP:  Kerin Perna, NP  Cardiologist:  Donato Heinz, MD  Electrophysiologist:  None   Referring MD: Kerin Perna, NP   Chief Complaint  Patient presents with   Congestive Heart Failure     History of Present Illness:    Veronica Stewart is a 65 y.o. female with a hx of atrial flutter, PAD status post stent pop bypass, hypertension, asthma who presents for follow-up.  She was admitted to [facility] on 02/11/2021 with epistaxis and new onset atrial flutter.  Anticoagulation was initially held due to her epistaxis and she was evaluated by ENT.  Underwent packing in epistaxis resolved, and she was started on Eliquis, which he tolerated well.  She had been on aspirin/Plavix for her PAD given her left external iliac stenting in August 2021.  She was transitioned to Eliquis monotherapy.  Echocardiogram 02/13/2020 showed EF 40 to 45%.  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  Since last clinic visit, she reports that she has been doing okay.  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  She stopped taking Eliquis due to epistaxis.  Denies any chest pain, dyspnea, lightheadedness, syncope, lower extremity edema, or palpitations.   Past Medical History:  Diagnosis Date   Asthma    Hypertension     Past Surgical History:  Procedure Laterality Date   ABDOMINAL AORTOGRAM W/LOWER EXTREMITY Bilateral 03/05/2020   Procedure: ABDOMINAL AORTOGRAM W/LOWER EXTREMITY;  Surgeon: Marty Heck, MD;  Location: [place] [facility];  Service: Cardiovascular;  Laterality: Bilateral;   ENDARTERECTOMY FEMORAL Left 03/24/2020   Procedure: LEFT COMMON FEMORAL ENDARTERECTOMY;  Surgeon: Marty Heck, MD;  Location: [place];  Service: Vascular;  Laterality: Left;   ENDOVEIN HARVEST  OF GREATER SAPHENOUS VEIN Left 03/24/2020   Procedure: HARVEST OF GREATER SAPHENOUS VEIN;  Surgeon: Marty Heck, MD;  Location: [place];  Service: Vascular;  Laterality: Left;   FEMORAL-POPLITEAL BYPASS GRAFT Left 03/24/2020   Procedure: BYPASS GRAFT FEMORAL-POPLITEAL ARTERY;  Surgeon: Marty Heck, MD;  Location: [place];  Service: Vascular;  Laterality: Left;   INSERTION OF ILIAC STENT Left 03/24/2020   Procedure: INSERTION OF RETROGRADE ILIAC STENT;  Surgeon: Marty Heck, MD;  Location: [place];  Service: Vascular;  Laterality: Left;   INTRAMEDULLARY (IM) NAIL INTERTROCHANTERIC Left 03/18/2018   Procedure: INTRAMEDULLARY (IM) NAIL INTERTROCHANTRIC;  Surgeon: Shona Needles, MD;  Location: [place];  Service: Orthopedics;  Laterality: Left;   INTRAOPERATIVE ARTERIOGRAM Left 03/24/2020   Procedure: INTRA OPERATIVE ARTERIOGRAM;  Surgeon: Marty Heck, MD;  Location: [facility];  Service: Vascular;  Laterality: Left;    Current Medications: Current Meds  Medication Sig   acetaminophen (TYLENOL) 500 MG tablet Take 500 mg by mouth every 6 (six) hours as needed for mild pain.   albuterol (VENTOLIN HFA) 108 (90 Base) MCG/ACT inhaler INHALE 2 PUFFS BY MOUTH EVERY 6 HOURS AS NEEDED FOR WHEEZING AND SHORTNESS OF BREATH   amiodarone (PACERONE) 200 MG tablet Take 1 tablet (200 mg total) by mouth daily.   feeding supplement (ENSURE ENLIVE / ENSURE PLUS) LIQD Take 237 mLs by mouth 3 (three) times daily between meals.   Fluticasone-Salmeterol (ADVAIR) 100-50 MCG/DOSE AEPB Inhale 1 puff into the lungs 2 (two) times daily.   Vitamin D, Ergocalciferol, (DRISDOL) 1.25  MG (50000 UNIT) CAPS capsule Take 50,000 Units by mouth 2 (two) times a week. Monday and Thursday   Current Facility-Administered Medications for the 05/11/21 encounter (Office Visit) with Donato Heinz, MD  Medication   albuterol (PROVENTIL) (2.5 MG/3ML) 0.083% nebulizer solution 2.5 mg     Allergies:   Patient has no  known allergies.   Social History   Socioeconomic History   Marital status: Legally Separated    Spouse name: Not on file   Number of children: Not on file   Years of education: Not on file   Highest education level: Not on file  Occupational History   Not on file  Tobacco Use   Smoking status: Every Day    Packs/day: 0.50    Types: Cigarettes   Smokeless tobacco: Never  Vaping Use   Vaping Use: Never used  Substance and Sexual Activity   Alcohol use: Yes    Comment: two 40 oz beers most days    Drug use: Not Currently    Types: Marijuana   Sexual activity: Not on file  Other Topics Concern   Not on file  Social History Narrative   Not on file   Social Determinants of Health   Financial Resource Strain: Not on file  Food Insecurity: Not on file  Transportation Needs: Not on file  Physical Activity: Not on file  Stress: Not on file  Social Connections: Not on file     Family History: [name] is negative for Sudden Cardiac Death.  ROS:   Please see the history of present illness.    (+) nose bleeds (+) cold  All other systems reviewed and are negative.  EKGs/Labs/Other Studies Reviewed:    The following studies were reviewed today:   EKG:   05/11/21: Normal sinus rhythm, rate 73, Q waves V1-4 07/22:sinus rhythm, rate 63, Q-wave V1-V4  Recent Labs: 02/11/2021: TSH 2.239 02/16/2021: Magnesium 1.9 03/11/2021: ALT 7; Hemoglobin 9.7; Platelets 325 03/26/2021: BUN 10; Creatinine, Ser 0.87; Potassium 5.3; Sodium 137  Recent Lipid Panel    Component Value Date/Time   CHOL 89 03/25/2020 0216   CHOL 173 03/26/2019 1450   TRIG 31 03/25/2020 0216   HDL 44 03/25/2020 0216   HDL 102 03/26/2019 1450   CHOLHDL 2.0 03/25/2020 0216   VLDL 6 03/25/2020 0216   LDLCALC 39 03/25/2020 0216   LDLCALC 48 03/26/2019 1450    Physical Exam:    VS:  BP (!) 150/80 (BP Location: Right Arm, Patient Position: Sitting, Cuff Size: Normal)   Pulse 73   Resp  20   Ht 5\' 9"  (1.753 m)   Wt 101 lb 6.4 oz (46 kg)   SpO2 98%   BMI 14.97 kg/m     Wt Readings from Last 3 Encounters:  05/11/21 101 lb 6.4 oz (46 kg)  03/11/21 104 lb 6.4 oz (47.4 kg)  03/07/21 104 lb (47.2 kg)     GEN:  Well nourished, well developed in no acute distress HEENT: Normal NECK: No JVD; No carotid bruits LYMPHATICS: No lymphadenopathy CARDIAC: RRR, no murmurs, rubs, gallops RESPIRATORY:  Clear to auscultation without rales, wheezing or rhonchi  ABDOMEN: Soft, non-tender, non-distended MUSCULOSKELETAL:  No edema; No deformity  SKIN: Warm and dry NEUROLOGIC:  Alert and oriented x 3 PSYCHIATRIC:  Normal affect   ASSESSMENT:    1. Atrial flutter, unspecified type ([place])   2. Preop examination   3. Chronic combined systolic and diastolic heart failure ([place])  4. Coronary artery disease involving native coronary artery of native heart without angina pectoris   5. Epistaxis   6. Hyponatremia   7. Essential hypertension     PLAN:    Preop evaluation: Prior to resection of right nasal cavity squamous cell carcinoma.  Denies any chest pain or dyspnea.  Limited functional activity.  Echo 02/12/2021 with EF 40 to 45%.  Appears compensated in regards to heart failure.  Coronary CTA 04/14/2021 showed moderate nonobstructive CAD.  No further cardiac work-up recommended prior to surgery.  Can continue to hold Eliquis given persistent epistaxis, can restart once stable postoperatively  Atrial Flutter: Noted during recent admission 01/2021, rates up to 140s.  CHADSVASc score 4 (CHF, HTN, PAD, female).  Echo shows EF 40 to 45% -Given she was going in and out of atrial flutter during admission, was started on amiodarone to maintain sinus rhythm.  Appears to be maintaining sinus rhythm on amiodarone 200 mg daily.  Will continue amiodarone and check Zio patch x7 days to evaluate atrial flutter burden -Eliquis currently on hold given epistaxis  -She recently stopped taking her  metoprolol.  Would restart Toprol-XL 25 mg daily   Chronic combined systolic and diastolic heart failure: Echo 02/12/2021 shows EF 40 to 45%.  New diagnosis heart failure.  Coronary CTA 04/14/2021 showed calcium score -Restart Toprol-XL as above -Losartan held due to hyperkalemia -Will plan cardiac MRI to evaluate etiology of nonischemic cardiomyopathy.  Currently appears well compensated in regards to her heart failure, this does not need to be done prior to her surgery   CAD: Coronary CTA 04/14/2021 showed calcium score 234 (93rd percentile), moderate nonobstructive CAD by CT FFR. -Started on atorvastatin 20 mg daily but stopped taking.  Would restart atorvastatin.  LDL 39 on 03/25/2020.  Epistaxis: reports long history of freq nose bleeds. Has had to have packing placed now with three recent episodes.  Found to have right nasal cavity SCC, planning surgery as above   Hyponatremia: sodium down to 129 on 02/16/2021.  Improved to 137 on 03/26/2021   HTN: Mildly elevated in clinic today but stopped taking her medication.  We will restart metoprolol   PAD s/p fem-pop bypass: with left external iliac stenting 8/21. Has been on ASA/plavix since that time.  During admission in June 2022, aspirin/Plavix were discontinued and switched to Eliquis monotherapy given her new atrial flutter as above.  Eliquis currently held in setting of epistaxis from nasal cavity SCC, plan to restart as soon as able postoperatively   Anemia: in the setting of acute blood loss from nose bleed 01/2021. Hgb down to 7.5 during admission, improved to 10.8 on 03/08/2021.    RTC in 2 months   Medication Adjustments/Labs and Tests Ordered: Current medicines are reviewed at length with the patient today.  Concerns regarding medicines are outlined above.  Orders Placed This Encounter  Procedures   LONG TERM MONITOR (3-14 DAYS)   EKG 12-Lead     Meds ordered this encounter  Medications   metoprolol succinate (TOPROL-XL) 25 MG 24  hr tablet    Sig: Take 1 tablet (25 mg total) by mouth daily. Take with or immediately following a meal.    Dispense:  90 tablet    Refill:  3   atorvastatin (LIPITOR) 20 MG tablet    Sig: Take 1 tablet (20 mg total) by mouth daily.    Dispense:  90 tablet    Refill:  3      Patient Instructions  Medication  Instructions:  RESTART metoprolol succinate (Toprol XL) 25 mg daily RESTART atorvastatin (Lipitor) 20 mg daily  Eliquis-restart when ok per your surgeon after procedure  *If you need a refill on your cardiac medications before your next appointment, please call your pharmacy*  Testing/Procedures: [place] Monitor Instructions   Your physician has requested you wear a ZIO patch monitor for _7__ days.  This is a single patch monitor.   [employer] supplies one patch monitor per enrollment. Additional stickers are not available. Please do not apply patch if you will be having a Nuclear Stress Test, Echocardiogram, Cardiac CT, MRI, or Chest Xray during the period you would be wearing the monitor. The patch cannot be worn during these tests. You cannot remove and re-apply the ZIO XT patch monitor.  Your ZIO patch monitor will be sent Fed Ex from [employer] directly to your home address. It may take 3-5 days to receive your monitor after you have been enrolled.  Once you have received your monitor, please review the enclosed instructions. Your monitor has already been registered assigning a specific monitor serial # to you.  Billing and Patient Assistance Program Information   We have supplied [employer] with any of your insurance information on file for billing purposes. [employer] offers a sliding scale Patient Assistance Program for patients that do not have insurance, or whose insurance does not completely cover the cost of the ZIO monitor.   You must apply for the Patient Assistance Program to qualify for this discounted rate.     To apply, please call [employer] at  [phone], select option 4, then select option 2, and ask to apply for Patient Assistance Program.  Theodore Demark will ask your household income, and how many people are in your household.  They will quote your out-of-pocket cost based on that information.  [employer] will also be able to set up a 32-month, interest-free payment plan if needed.  Applying the monitor   Shave hair from upper left chest.  Hold abrader disc by orange tab. Rub abrader in 40 strokes over the upper left chest as indicated in your monitor instructions.  Clean area with 4 enclosed alcohol pads. Let dry.  Apply patch as indicated in monitor instructions. Patch will be placed under collarbone on left side of chest with arrow pointing upward.  Rub patch adhesive wings for 2 minutes. Remove white label marked "1". Remove the white label marked "2". Rub patch adhesive wings for 2 additional minutes.  While looking in a mirror, press and release button in center of patch. A small green light will flash 3-4 times. This will be your only indicator that the monitor has been turned on. ?  Do not shower for the first 24 hours. You may shower after the first 24 hours.  Press the button if you feel a symptom. You will hear a small click. Record Date, Time and Symptom in the Patient Logbook.  When you are ready to remove the patch, follow instructions on the last 2 pages of the Patient Logbook. Stick patch monitor onto the last page of Patient Logbook.  Place Patient Logbook in the blue and white box.  Use locking tab on box and tape box closed securely.  The blue and white box has prepaid postage on it. Please place it in the mailbox as soon as possible. Your physician should have your test results approximately 7 days after the monitor has been mailed back to [facility].  Call [place] at  [phone] if you have questions regarding your ZIO XT patch monitor. Call them immediately if you see an orange light blinking on  your monitor.  If your monitor falls off in less than 4 days, contact our Monitor department at [phone]. ?If your monitor becomes loose or falls off after 4 days call [employer] at [phone] for suggestions on securing your monitor.?  Follow-Up: At [facility], you and your health needs are our priority.  As part of our continuing mission to provide you with exceptional heart care, we have created designated Provider Care Teams.  These Care Teams include your primary Cardiologist (physician) and Advanced Practice Providers (APPs -  Physician Assistants and Nurse Practitioners) who all work together to provide you with the care you need, when you need it.  We recommend signing up for the patient portal called "MyChart".  Sign up information is provided on this After Visit Summary.  MyChart is used to connect with patients for Virtual Visits (Telemedicine).  Patients are able to view lab/test results, encounter notes, upcoming appointments, etc.  Non-urgent messages can be sent to your provider as well.   To learn more about what you can do with MyChart, go to [email or web address].    Your next appointment:   Monday, November 14th at 10:20 AM with Dr. Gardiner Rhyme      Signed, Donato Heinz, MD  05/11/2021 10:57 AM    [place]

## 2021-05-11 ENCOUNTER — Other Ambulatory Visit: Payer: Self-pay

## 2021-05-11 ENCOUNTER — Ambulatory Visit: Payer: Medicaid Other

## 2021-05-11 ENCOUNTER — Ambulatory Visit (INDEPENDENT_AMBULATORY_CARE_PROVIDER_SITE_OTHER): Payer: Medicaid Other | Admitting: Cardiology

## 2021-05-11 ENCOUNTER — Encounter: Payer: Self-pay | Admitting: Cardiology

## 2021-05-11 VITALS — BP 150/80 | HR 73 | Resp 20 | Ht 69.0 in | Wt 101.4 lb

## 2021-05-11 DIAGNOSIS — I10 Essential (primary) hypertension: Secondary | ICD-10-CM

## 2021-05-11 DIAGNOSIS — I5042 Chronic combined systolic (congestive) and diastolic (congestive) heart failure: Secondary | ICD-10-CM | POA: Diagnosis not present

## 2021-05-11 DIAGNOSIS — E871 Hypo-osmolality and hyponatremia: Secondary | ICD-10-CM

## 2021-05-11 DIAGNOSIS — I4892 Unspecified atrial flutter: Secondary | ICD-10-CM | POA: Diagnosis not present

## 2021-05-11 DIAGNOSIS — I2510 Atherosclerotic heart disease of native coronary artery without angina pectoris: Secondary | ICD-10-CM

## 2021-05-11 DIAGNOSIS — R040 Epistaxis: Secondary | ICD-10-CM

## 2021-05-11 DIAGNOSIS — Z01810 Encounter for preprocedural cardiovascular examination: Secondary | ICD-10-CM | POA: Diagnosis not present

## 2021-05-11 DIAGNOSIS — Z01818 Encounter for other preprocedural examination: Secondary | ICD-10-CM

## 2021-05-11 MED ORDER — ATORVASTATIN CALCIUM 20 MG PO TABS: 20.0000 mg | ORAL_TABLET | Freq: Every day | ORAL | 3 refills | Status: DC

## 2021-05-11 MED ORDER — METOPROLOL SUCCINATE ER 25 MG PO TB24: 25.0000 mg | ORAL_TABLET | Freq: Every day | ORAL | 3 refills | Status: DC

## 2021-05-11 NOTE — Patient Instructions (Signed)
Medication Instructions:  RESTART metoprolol succinate (Toprol XL) 25 mg daily RESTART atorvastatin (Lipitor) 20 mg daily  Eliquis-restart when ok per your surgeon after procedure  *If you need a refill on your cardiac medications before your next appointment, please call your pharmacy*  Testing/Procedures: [place] Monitor Instructions   Your physician has requested you wear a ZIO patch monitor for _7__ days.  This is a single patch monitor.   [employer] supplies one patch monitor per enrollment. Additional stickers are not available. Please do not apply patch if you will be having a Nuclear Stress Test, Echocardiogram, Cardiac CT, MRI, or Chest Xray during the period you would be wearing the monitor. The patch cannot be worn during these tests. You cannot remove and re-apply the ZIO XT patch monitor.  Your ZIO patch monitor will be sent Fed Ex from [employer] directly to your home address. It may take 3-5 days to receive your monitor after you have been enrolled.  Once you have received your monitor, please review the enclosed instructions. Your monitor has already been registered assigning a specific monitor serial # to you.  Billing and Patient Assistance Program Information   We have supplied [employer] with any of your insurance information on file for billing purposes. [employer] offers a sliding scale Patient Assistance Program for patients that do not have insurance, or whose insurance does not completely cover the cost of the ZIO monitor.   You must apply for the Patient Assistance Program to qualify for this discounted rate.     To apply, please call [employer] at [phone], select option 4, then select option 2, and ask to apply for Patient Assistance Program.  Theodore Demark will ask your household income, and how many people are in your household.  They will quote your out-of-pocket cost based on that information.  [employer] will also be able to set up a 12-month, interest-free  payment plan if needed.  Applying the monitor   Shave hair from upper left chest.  Hold abrader disc by orange tab. Rub abrader in 40 strokes over the upper left chest as indicated in your monitor instructions.  Clean area with 4 enclosed alcohol pads. Let dry.  Apply patch as indicated in monitor instructions. Patch will be placed under collarbone on left side of chest with arrow pointing upward.  Rub patch adhesive wings for 2 minutes. Remove white label marked "1". Remove the white label marked "2". Rub patch adhesive wings for 2 additional minutes.  While looking in a mirror, press and release button in center of patch. A small green light will flash 3-4 times. This will be your only indicator that the monitor has been turned on. ?  Do not shower for the first 24 hours. You may shower after the first 24 hours.  Press the button if you feel a symptom. You will hear a small click. Record Date, Time and Symptom in the Patient Logbook.  When you are ready to remove the patch, follow instructions on the last 2 pages of the Patient Logbook. Stick patch monitor onto the last page of Patient Logbook.  Place Patient Logbook in the blue and white box.  Use locking tab on box and tape box closed securely.  The blue and white box has prepaid postage on it. Please place it in the mailbox as soon as possible. Your physician should have your test results approximately 7 days after the monitor has been mailed back to [facility].  Call [facility]  at [phone] if you have questions regarding your ZIO XT patch monitor. Call them immediately if you see an orange light blinking on your monitor.  If your monitor falls off in less than 4 days, contact our Monitor department at [phone]. ?If your monitor becomes loose or falls off after 4 days call [employer] at [phone] for suggestions on securing your monitor.?  Follow-Up: At [facility], you and your health needs are our  priority.  As part of our continuing mission to provide you with exceptional heart care, we have created designated Provider Care Teams.  These Care Teams include your primary Cardiologist (physician) and Advanced Practice Providers (APPs -  Physician Assistants and Nurse Practitioners) who all work together to provide you with the care you need, when you need it.  We recommend signing up for the patient portal called "MyChart".  Sign up information is provided on this After Visit Summary.  MyChart is used to connect with patients for Virtual Visits (Telemedicine).  Patients are able to view lab/test results, encounter notes, upcoming appointments, etc.  Non-urgent messages can be sent to your provider as well.   To learn more about what you can do with MyChart, go to [email or web address].    Your next appointment:   Monday, November 14th at 10:20 AM with Dr. Gardiner Rhyme

## 2021-05-11 NOTE — Progress Notes (Unsigned)
Enrolled patient for a 7 day Zio XT monitor to be mailed to patients home   Monitor was marked Lost in [place] returned

## 2021-05-13 ENCOUNTER — Other Ambulatory Visit: Payer: Self-pay

## 2021-05-13 ENCOUNTER — Ambulatory Visit: Payer: Medicaid Other | Admitting: Podiatry

## 2021-05-13 ENCOUNTER — Telehealth: Payer: Self-pay | Admitting: Cardiology

## 2021-05-13 DIAGNOSIS — M79675 Pain in left toe(s): Secondary | ICD-10-CM

## 2021-05-13 DIAGNOSIS — Z7901 Long term (current) use of anticoagulants: Secondary | ICD-10-CM | POA: Diagnosis not present

## 2021-05-13 DIAGNOSIS — B351 Tinea unguium: Secondary | ICD-10-CM

## 2021-05-13 DIAGNOSIS — M79674 Pain in right toe(s): Secondary | ICD-10-CM

## 2021-05-13 NOTE — Telephone Encounter (Signed)
I spoke with Averil at [facility] and sent her Dr. Newman Nickels note talking about the pts  Eliquis... from 05/11/21.

## 2021-05-13 NOTE — Telephone Encounter (Signed)
Veronica Stewart is calling needing instruction for patients upcoming procedure. Pt is needing to stop Eliquis 5 Days prior to procedure on 05/29/21. Doesn't want to complete clearance form on the phone... is wanting the nurse to contact them to proceed... please advise.

## 2021-05-15 NOTE — Progress Notes (Signed)
Subjective: 65 y.o. returns the office today for painful, elongated, thickened toenails which she cannot trim herself. Denies any redness or drainage around the nails.  Denies any systemic complaints such as fevers, chills, nausea, vomiting.   PCP: Kerin Perna, NP   Objective: AAO 3, NAD DP/PT pulses palpable, CRT less than 3 seconds Nails hypertrophic, dystrophic, elongated, brittle, discolored 9. There is tenderness overlying the nails 1-5 bilaterally except the left hallux with no nails present.  There is a thick callus to the distal portion of the left second toe which she did not want me to remove this.  There is no surrounding erythema or drainage along the nail sites. No open lesions or pre-ulcerative lesions are identified. No pain with calf compression, swelling, warmth, erythema.  Assessment: Patient presents with symptomatic onychomycosis  Plan: -Treatment options including alternatives, risks, complications were discussed -Nails sharply debrided 9 without complication/bleeding. -Discussed daily foot inspection. If there are any changes, to call the office immediately.  -Follow-up in 3 months or sooner if any problems are to arise. In the meantime, encouraged to call the office with any questions, concerns, changes symptoms.  Celesta Gentile, DPM

## 2021-05-29 ENCOUNTER — Ambulatory Visit (INDEPENDENT_AMBULATORY_CARE_PROVIDER_SITE_OTHER): Payer: Medicaid Other | Admitting: Primary Care

## 2021-06-05 DIAGNOSIS — D638 Anemia in other chronic diseases classified elsewhere: Secondary | ICD-10-CM | POA: Insufficient documentation

## 2021-06-05 DIAGNOSIS — E871 Hypo-osmolality and hyponatremia: Secondary | ICD-10-CM | POA: Insufficient documentation

## 2021-06-28 NOTE — Progress Notes (Deleted)
Cardiology Office Note:    Date:  06/28/2021   ID:  Veronica Stewart, DOB [birth year], MRN [id number]  PCP:  Veronica Perna, NP  Cardiologist:  Donato Heinz, MD  Electrophysiologist:  None   Referring MD: Veronica Perna, NP   No chief complaint on file.    History of Present Illness:    Veronica Stewart is a 65 y.o. female with a hx of atrial flutter, PAD status post stent pop bypass, hypertension, asthma who presents for follow-up.  She was admitted to [facility] on 02/11/2021 with epistaxis and new onset atrial flutter.  Anticoagulation was initially held due to her epistaxis and she was evaluated by ENT.  Underwent packing in epistaxis resolved, and she was started on Eliquis, which he tolerated well.  She had been on aspirin/Plavix for her PAD given her left external iliac stenting in August 2021.  She was transitioned to Eliquis monotherapy.  Echocardiogram 02/13/2020 showed EF 40 to 45%.  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  Course was complicated by hyponatremia.  Had G-tube placed and is on tube feeds.  Since last clinic visit,  Cardiac MRI she reports that she has been doing okay.  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  She stopped taking Eliquis due to epistaxis.  Denies any chest pain, dyspnea, lightheadedness, syncope, lower extremity edema, or palpitations.   Past Medical History:  Diagnosis Date   Asthma    Hypertension     Past Surgical History:  Procedure Laterality Date   ABDOMINAL AORTOGRAM W/LOWER EXTREMITY Bilateral 03/05/2020   Procedure: ABDOMINAL AORTOGRAM W/LOWER EXTREMITY;  Surgeon: Marty Heck, MD;  Location: [place] [facility];  Service: Cardiovascular;  Laterality: Bilateral;   ENDARTERECTOMY FEMORAL Left  03/24/2020   Procedure: LEFT COMMON FEMORAL ENDARTERECTOMY;  Surgeon: Marty Heck, MD;  Location: [place];  Service: Vascular;  Laterality: Left;   ENDOVEIN HARVEST OF GREATER SAPHENOUS VEIN Left 03/24/2020   Procedure: HARVEST OF GREATER SAPHENOUS VEIN;  Surgeon: Marty Heck, MD;  Location: [place];  Service: Vascular;  Laterality: Left;   FEMORAL-POPLITEAL BYPASS GRAFT Left 03/24/2020   Procedure: BYPASS GRAFT FEMORAL-POPLITEAL ARTERY;  Surgeon: Marty Heck, MD;  Location: [place];  Service: Vascular;  Laterality: Left;   INSERTION OF ILIAC STENT Left 03/24/2020   Procedure: INSERTION OF RETROGRADE ILIAC STENT;  Surgeon: Marty Heck, MD;  Location: [place];  Service: Vascular;  Laterality: Left;   INTRAMEDULLARY (IM) NAIL INTERTROCHANTERIC Left 03/18/2018   Procedure: INTRAMEDULLARY (IM) NAIL INTERTROCHANTRIC;  Surgeon: Shona Needles, MD;  Location: [place];  Service: Orthopedics;  Laterality: Left;   INTRAOPERATIVE ARTERIOGRAM Left 03/24/2020   Procedure: INTRA OPERATIVE ARTERIOGRAM;  Surgeon: Marty Heck, MD;  Location: [place];  Service: Vascular;  Laterality: Left;    Current Medications: No outpatient medications have been marked as taking for the 06/29/21 encounter (Appointment) with Donato Heinz, MD.   Current Facility-Administered Medications for the 06/29/21 encounter (Appointment) with Donato Heinz, MD  Medication   albuterol (PROVENTIL) (2.5 MG/3ML) 0.083% nebulizer solution 2.5 mg     Allergies:   Patient has no known allergies.   Social History   Socioeconomic History   Marital status: Legally Separated    Spouse name: Not on file   Number of  children: Not on file   Years of education: Not on file   Highest education level: Not on file  Occupational History   Not on file  Tobacco Use   Smoking status: Every Day    Packs/day: 0.50    Types: Cigarettes   Smokeless tobacco: Never  Vaping Use   Vaping Use: Never used   Substance and Sexual Activity   Alcohol use: Yes    Comment: two 40 oz beers most days    Drug use: Not Currently    Types: Marijuana   Sexual activity: Not on file  Other Topics Concern   Not on file  Social History Narrative   Not on file   Social Determinants of Health   Financial Resource Strain: Not on file  Food Insecurity: Not on file  Transportation Needs: Not on file  Physical Activity: Not on file  Stress: Not on file  Social Connections: Not on file     Family History: [name] is negative for Sudden Cardiac Death.  ROS:   Please see the history of present illness.    (+) nose bleeds (+) cold  All other systems reviewed and are negative.  EKGs/Labs/Other Studies Reviewed:    The following studies were reviewed today:   EKG:   05/11/21: Normal sinus rhythm, rate 73, Q waves V1-4 07/22:sinus rhythm, rate 63, Q-wave V1-V4  Recent Labs: 02/11/2021: TSH 2.239 02/16/2021: Magnesium 1.9 03/11/2021: ALT 7; Hemoglobin 9.7; Platelets 325 03/26/2021: BUN 10; Creatinine, Ser 0.87; Potassium 5.3; Sodium 137  Recent Lipid Panel    Component Value Date/Time   CHOL 89 03/25/2020 0216   CHOL 173 03/26/2019 1450   TRIG 31 03/25/2020 0216   HDL 44 03/25/2020 0216   HDL 102 03/26/2019 1450   CHOLHDL 2.0 03/25/2020 0216   VLDL 6 03/25/2020 0216   LDLCALC 39 03/25/2020 0216   LDLCALC 48 03/26/2019 1450    Physical Exam:    VS:  There were no vitals taken for this visit.    Wt Readings from Last 3 Encounters:  05/11/21 101 lb 6.4 oz (46 kg)  03/11/21 104 lb 6.4 oz (47.4 kg)  03/07/21 104 lb (47.2 kg)     GEN:  Well nourished, well developed in no acute distress HEENT: Normal NECK: No JVD; No carotid bruits LYMPHATICS: No lymphadenopathy CARDIAC: RRR, no murmurs, rubs, gallops RESPIRATORY:  Clear to auscultation without rales, wheezing or rhonchi  ABDOMEN: Soft, non-tender, non-distended MUSCULOSKELETAL:  No edema; No deformity  SKIN:  Warm and dry NEUROLOGIC:  Alert and oriented x 3 PSYCHIATRIC:  Normal affect   ASSESSMENT:    No diagnosis found.   PLAN:    Preop evaluation: Prior to resection of right nasal cavity squamous cell carcinoma.  Denies any chest pain or dyspnea.  Limited functional activity.  Echo 02/12/2021 with EF 40 to 45%.  Appears compensated in regards to heart failure.  Coronary CTA 04/14/2021 showed moderate nonobstructive CAD.  No further cardiac work-up recommended prior to surgery.  Can continue to hold Eliquis given persistent epistaxis, can restart once stable postoperatively  Atrial Flutter: Noted during admission 01/2021, rates up to 140s.  CHADSVASc score 4 (CHF, HTN, PAD, female).  Echo shows EF 40 to 45% -Given she was going in and out of atrial flutter during admission, was started on amiodarone to maintain sinus rhythm.  Appears to be maintaining sinus rhythm on amiodarone 200 mg daily.  Will continue amiodarone and check Zio patch x7 days to  evaluate atrial flutter burden -Eliquis currently on hold given epistaxis  -She recently stopped taking her metoprolol.  Would restart Toprol-XL 25 mg daily   Chronic combined systolic and diastolic heart failure: Echo 02/12/2021 shows EF 40 to 45%.  New diagnosis heart failure.  Coronary CTA 04/14/2021 showed calcium score -Restart Toprol-XL as above -Losartan held due to hyperkalemia -Will plan cardiac MRI to evaluate etiology of nonischemic cardiomyopathy.  Currently appears well compensated in regards to her heart failure, this does not need to be done prior to her surgery   CAD: Coronary CTA 04/14/2021 showed calcium score 234 (93rd percentile), moderate nonobstructive CAD by CT FFR. -Started on atorvastatin 20 mg daily but stopped taking.  Would restart atorvastatin.  LDL 39 on 03/25/2020.  Epistaxis: reports long history of freq nose bleeds. Has had to have packing placed now with three recent episodes.  Found to have right nasal cavity SCC, planning  surgery as above   Hyponatremia: sodium down to 129 on 02/16/2021.  Improved to 137 on 03/26/2021   HTN: On Toprol-XL 25 mg   PAD s/p fem-pop bypass: with left external iliac stenting 8/21. Has been on ASA/plavix since that time.  During admission in June 2022, aspirin/Plavix were discontinued and switched to Eliquis monotherapy given her new atrial flutter as above.  Eliquis currently held in setting of epistaxis from nasal cavity SCC, plan to restart as soon as able postoperatively   Anemia: in the setting of acute blood loss from nose bleed 01/2021. Hgb down to 7.5 during admission, improved to 10.8 on 03/08/2021.    RTC in***   Medication Adjustments/Labs and Tests Ordered: Current medicines are reviewed at length with the patient today.  Concerns regarding medicines are outlined above.  No orders of the defined types were placed in this encounter.    No orders of the defined types were placed in this encounter.     There are no Patient Instructions on file for this visit.     Signed, Donato Heinz, MD  06/28/2021 4:55 PM    [facility]

## 2021-06-29 ENCOUNTER — Ambulatory Visit: Payer: Medicaid Other | Admitting: Cardiology

## 2021-06-30 ENCOUNTER — Telehealth: Payer: Self-pay | Admitting: Cardiology

## 2021-06-30 NOTE — Telephone Encounter (Signed)
They received an empty box with no device.

## 2021-07-14 DIAGNOSIS — Z931 Gastrostomy status: Secondary | ICD-10-CM | POA: Insufficient documentation

## 2021-07-14 DIAGNOSIS — Z5111 Encounter for antineoplastic chemotherapy: Secondary | ICD-10-CM | POA: Insufficient documentation

## 2021-07-14 DIAGNOSIS — R112 Nausea with vomiting, unspecified: Secondary | ICD-10-CM | POA: Insufficient documentation

## 2021-07-30 ENCOUNTER — Ambulatory Visit: Payer: Medicaid Other | Admitting: Podiatry

## 2021-08-25 ENCOUNTER — Encounter (HOSPITAL_COMMUNITY): Payer: Self-pay

## 2021-08-25 ENCOUNTER — Emergency Department (HOSPITAL_COMMUNITY)
Admission: EM | Admit: 2021-08-25 | Discharge: 2021-08-26 | Disposition: A | Discharge status: Home / Self Care | Payer: Medicaid Other | Attending: Emergency Medicine | Admitting: Emergency Medicine

## 2021-08-25 DIAGNOSIS — Z85820 Personal history of malignant melanoma of skin: Secondary | ICD-10-CM | POA: Diagnosis not present

## 2021-08-25 DIAGNOSIS — K1379 Other lesions of oral mucosa: Secondary | ICD-10-CM

## 2021-08-25 DIAGNOSIS — D759 Disease of blood and blood-forming organs, unspecified: Secondary | ICD-10-CM | POA: Insufficient documentation

## 2021-08-25 DIAGNOSIS — K1230 Oral mucositis (ulcerative), unspecified: Secondary | ICD-10-CM | POA: Diagnosis not present

## 2021-08-25 LAB — BASIC METABOLIC PANEL
Anion gap: 7 (ref 5–15)
BUN: 19 mg/dL (ref 8–23)
CO2: 27 mmol/L (ref 22–32)
Calcium: 9.3 mg/dL (ref 8.9–10.3)
Chloride: 98 mmol/L (ref 98–111)
Creatinine, Ser: 0.94 mg/dL (ref 0.44–1.00)
GFR, Estimated: >60 mL/min (ref >60)
Glucose, Bld: 92 mg/dL (ref 70–99)
Potassium: 4.6 mmol/L (ref 3.5–5.1)
Sodium: 132 mmol/L — ABNORMAL LOW (ref 135–145)

## 2021-08-25 LAB — CBC WITH DIFFERENTIAL/PLATELET
Abs Immature Granulocytes: 0 10*3/uL (ref 0.00–0.07)
Basophils Absolute: 0 10*3/uL (ref 0.0–0.1)
Basophils Relative: 0 %
Eosinophils Absolute: 0 10*3/uL (ref 0.0–0.5)
Eosinophils Relative: 3 %
HCT: 28.4 % — ABNORMAL LOW (ref 36.0–46.0)
Hemoglobin: 8.4 g/dL — ABNORMAL LOW (ref 12.0–15.0)
Lymphocytes Relative: 43 %
Lymphs Abs: 0.6 10*3/uL — ABNORMAL LOW (ref 0.7–4.0)
MCH: 25.5 pg — ABNORMAL LOW (ref 26.0–34.0)
MCHC: 29.6 g/dL — ABNORMAL LOW (ref 30.0–36.0)
MCV: 86.1 fL (ref 80.0–100.0)
Monocytes Absolute: 0.1 10*3/uL (ref 0.1–1.0)
Monocytes Relative: 9 %
Neutro Abs: 0.6 10*3/uL — ABNORMAL LOW (ref 1.7–7.7)
Neutrophils Relative %: 43 %
Platelets: 208 10*3/uL (ref 150–400)
Promyelocytes Relative: 2 %
RBC: 3.3 MIL/uL — ABNORMAL LOW (ref 3.87–5.11)
RDW: 20.1 % — ABNORMAL HIGH (ref 11.5–15.5)
WBC: 1.3 10*3/uL — CL (ref 4.0–10.5)
nRBC: 0 % (ref 0.0–0.2)
nRBC: 3 /100 WBC — ABNORMAL HIGH

## 2021-08-25 NOTE — ED Triage Notes (Signed)
Pt BIB [facility] for eval of oral bleeding. Pt had radiation today nose cancer, and now has several clots in mouth that she cannot spit out. Maintaining airway on arrival.

## 2021-08-25 NOTE — ED Provider Triage Note (Signed)
Emergency Medicine Provider Triage Evaluation Note  Veronica Stewart , a 66 y.o. female  was evaluated in triage.  Pt complains of bleeding from her mouth. Recently had radiation for nasal ca and is now having bleeding. She is also c/o pain to the mouth. Sent here by her facility  Review of Systems  Positive: Oral bleeding Negative: fever  Physical Exam  BP (!) 182/106 (BP Location: Right Arm)    Pulse 72    Temp 98.1 F (36.7 C) (Oral)    Resp 15    Ht 5\' 9"  (1.753 m)    Wt 46 kg    SpO2 100%    BMI 14.98 kg/m  Gen:   Awake, no distress   Resp:  Normal effort  MSK:   Moves extremities without difficulty  Other:  Dried blood noted to the mouth/lips, no obvious active bleeding noted.  Medical Decision Making  Medically screening exam initiated at 8:54 PM.  Appropriate orders placed.  Veronica Stewart was informed that the remainder of the evaluation will be completed by another provider, this initial triage assessment does not replace that evaluation, and the importance of remaining in the ED until their evaluation is complete.     Veronica Stewart, [place] 08/25/21 2056

## 2021-08-26 MED ORDER — HYDROCODONE-ACETAMINOPHEN 5-325 MG PO TABS: 1.0000 | ORAL_TABLET | ORAL | 0 refills | Status: DC | PRN

## 2021-08-26 MED ORDER — MORPHINE SULFATE (PF) 4 MG/ML IV SOLN
6.0000 mg | Freq: Once | INTRAVENOUS | Status: AC
  Administered 2021-08-26: 6 mg via INTRAMUSCULAR
  Filled 2021-08-26 (×2): qty 2

## 2021-08-26 MED ORDER — MORPHINE SULFATE (PF) 4 MG/ML IV SOLN: 4.0000 mg | Freq: Once | INTRAVENOUS | Status: DC

## 2021-08-26 NOTE — Discharge Instructions (Signed)
The bleeding in your mouth today is now well controlled.  The bleeding not appear to be blocking your airway or to be actively bleeding during exam.  Your labs appear stable from yesterday. I have given you some pain medication here presenting a few tablets to take home.  But you will need to call your oncologist to ask about changing your mucositis mouth wash  since the one that you are using does not seem to be helping. Please return if your bleeding significantly worsens or you develop worsening dizziness or weakness.

## 2021-08-26 NOTE — ED Notes (Signed)
Report called to [facility]

## 2021-08-26 NOTE — ED Notes (Signed)
Pt ambulated using her walker independently with staff supervision.

## 2021-08-26 NOTE — ED Provider Notes (Signed)
[place] EMERGENCY DEPARTMENT Provider Note   CSN: [id number] Arrival date & time: 08/25/21  2029     History  Chief Complaint  Patient presents with   Oral Bleeding    Veronica Stewart is a 66 y.o. 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 fib presenting for oral bleeding that started last night.  Patient currently being treated for mucositis with medicated mouthwash with lidocaine.  Per patient and nursing home nurse, patient was found yesterday with significant amount of blood all over her pillow.  EMS was called.  Patient states she feels slightly weak but otherwise okay.  HPI     Home Medications Prior to Admission medications   Medication Sig Start Date End Date Taking? Authorizing Provider  HYDROcodone-acetaminophen (NORCO/VICODIN) 5-325 MG tablet Take 1 tablet by mouth every 4 (four) hours as needed for severe pain. 08/26/21  Yes Tonye Pearson, PA-C  acetaminophen (TYLENOL) 500 MG tablet Take 500 mg by mouth every 6 (six) hours as needed for mild pain.    [provider]  albuterol (VENTOLIN HFA) 108 (90 Base) MCG/ACT inhaler INHALE 2 PUFFS BY MOUTH EVERY 6 HOURS AS NEEDED FOR WHEEZING AND SHORTNESS OF BREATH 12/06/19   Kerin Perna, NP  albuterol (VENTOLIN HFA) 108 (90 Base) MCG/ACT inhaler Inhale 2 puffs into the lungs every 6 (six) hours as needed for wheezing.    [provider]  amiodarone (PACERONE) 200 MG tablet Take 1 tablet (200 mg total) by mouth daily. 03/11/21 09/07/21  Donato Heinz, MD  atorvastatin (LIPITOR) 20 MG tablet Take 1 tablet (20 mg total) by mouth daily. 05/11/21   Donato Heinz, MD  feeding supplement (ENSURE ENLIVE / ENSURE PLUS) LIQD Take 237 mLs by mouth 3  (three) times daily between meals. 02/16/21   Delene Ruffini, MD  Fluticasone-Salmeterol (ADVAIR) 100-50 MCG/DOSE AEPB Inhale 1 puff into the lungs 2 (two) times daily. 12/06/19   Kerin Perna, NP  lisinopril (ZESTRIL) 10 MG tablet Take 10 mg by mouth daily. 08/12/21   [provider]  lisinopril (ZESTRIL) 5 MG tablet Take 5 mg by mouth daily. 08/05/21   [provider]  metoprolol succinate (TOPROL-XL) 25 MG 24 hr tablet Take 1 tablet (25 mg total) by mouth daily. Take with or immediately following a meal. 05/11/21 05/06/22  Donato Heinz, MD  metoprolol tartrate (LOPRESSOR) 25 MG tablet Take 25 mg by mouth daily. 08/05/21   [provider]  sodium chloride 1 g tablet Take 1 g by mouth 2 (two) times daily. 08/15/21   [provider]  Vitamin D, Ergocalciferol, (DRISDOL) 1.25 MG (50000 UNIT) CAPS capsule Take 50,000 Units by mouth 2 (two) times a week. Monday and Thursday 12/03/19   [provider]      Allergies    Patient has no known allergies.    Review of Systems   Review of Systems  HENT:  Positive for mouth sores.    Physical Exam Updated Vital Signs BP (!) 183/103 (BP Location: Right Arm)    Pulse 82    Temp (!) 97.5 F (36.4 C) (Oral)    Resp 16    Ht 5\' 9"  (1.753 m)    Wt 46 kg    SpO2 100%    BMI 14.98 kg/m  Physical Exam Vitals  and nursing note reviewed.  Constitutional:      Comments: Frail, chronically ill-appearing   HENT:     Head: Atraumatic.     Comments: Extensive sequelae of maxillofacial surgery noted     Mouth/Throat:     Comments: Coagulated blood observed on tongue. Source is a sore on the right buccal mucosa. No active bleeding. Airway intact Eyes:     Conjunctiva/sclera: Conjunctivae normal.  Cardiovascular:     Rate and Rhythm: Normal rate and regular rhythm.     Pulses: Normal pulses.     Heart sounds: No murmur heard. Pulmonary:     Effort: Pulmonary effort is normal. No respiratory distress.      Breath sounds: Normal breath sounds.  Abdominal:     General: Abdomen is flat. There is no distension.     Palpations: Abdomen is soft.     Tenderness: There is no abdominal tenderness.  Musculoskeletal:        General: Normal range of motion.     Cervical back: Normal range of motion.  Skin:    General: Skin is warm and dry.     Capillary Refill: Capillary refill takes less than 2 seconds.  Neurological:     General: No focal deficit present.     Mental Status: She is alert.  Psychiatric:        Mood and Affect: Mood normal.    ED Results / Procedures / Treatments   Labs (all labs ordered are listed, but only abnormal results are displayed) Labs Reviewed  BASIC METABOLIC PANEL - Abnormal; Notable for the following components:      Result Value   Sodium 132 (*)    All other components within normal limits  CBC WITH DIFFERENTIAL/PLATELET - Abnormal; Notable for the following components:   WBC 1.3 (*)    RBC 3.30 (*)    Hemoglobin 8.4 (*)    HCT 28.4 (*)    MCH 25.5 (*)    MCHC 29.6 (*)    RDW 20.1 (*)    Neutro Abs 0.6 (*)    Lymphs Abs 0.6 (*)    nRBC 3 (*)    All other components within normal limits  PATHOLOGIST SMEAR REVIEW    EKG None  Radiology No results found.  Procedures Procedures    Medications Ordered in ED Medications  morphine 4 MG/ML injection 6 mg (6 mg Intramuscular Given 08/26/21 0753)    ED Course/ Medical Decision Making/ A&P                           Medical Decision Making  Veronica Stewart is a 66 y.o. 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 fib presenting for oral bleeding that started last night.  Patient currently being treated for mucositis with medicated mouthwash with lidocaine.  Per patient and  nursing home nurse, patient was found yesterday with significant amount of blood all over her pillow.  EMS was called.  Patient states she feels slightly weak but otherwise okay. this involves an extensive number of treatment options, and is a complaint that carries with it a high risk of complications and morbidity.  The differential diagnosis includes mucositis, HSV, oral candidiasis, orbital tumor, laceration   Additional history obtained:  Additional history obtained from SNF home health  nurse, Curt Bears via phone call as well as patient's mother at bedside. External records from outside source obtained and reviewed including previous labs, records from hematology/oncology   Lab Tests:  I Ordered, reviewed, and interpreted labs.  The pertinent results include: WBC 1.3, stable from yesterday; hemoglobin 8.4 which is also stable from yesterday.  BMP unremarkable.   Imaging Studies ordered: No imaging indicated   Cardiac Monitoring:  The patient was maintained on a cardiac monitor.  I personally viewed and interpreted the cardiac monitored which showed an underlying rhythm of: NSR   Medicines ordered and prescription drug management:  I ordered medication including morphine 6mg  IV  for pain  Reevaluation of the patient after these medicines showed that the patient improved I have reviewed the patients home medicines and have made adjustments as needed   Dispostion: 1) Mucositis, oral After consideration of the diagnostic results and the patients response to treatment feel that the patent would benefit from pain management we will follow-up with her oncologist.  Patient's vitals and lab work were stable here in the ED.  There is no active bleeding seen on physical exam, although there was coagulated blood throughout her oral cavity.  Airway was intact with no compromise.  Patient was able to speak in full sentences.  From an emergency standpoint, I am able to control her pain, but given  the complexity of her chronic disease, her regular ENT her oncologist would be better with chronic management of her mucositis.  I have called her SNF [facility] and rehabilitation and advised that her nurse, Barnetta Chapel, should call her regular doctor and request medication change for her mucositis.  She expressed understanding.  I also spoke with patient and her mother at bedside who also expressed understanding.  Pain was well controlled at discharge and pt was in good and stable condition.  Final Clinical Impression(s) / ED Diagnoses Final diagnoses:  Oral bleeding  Mucositis oral    Rx / DC Orders ED Discharge Orders          Ordered    HYDROcodone-acetaminophen (NORCO/VICODIN) 5-325 MG tablet  Every 4 hours PRN        08/26/21 0757              Tonye Pearson, PA-C 08/26/21 [id number]    Regan Lemming, MD 08/26/21 [phone]

## 2021-08-27 LAB — PATHOLOGIST SMEAR REVIEW

## 2021-10-01 ENCOUNTER — Other Ambulatory Visit: Payer: Self-pay

## 2021-10-01 ENCOUNTER — Ambulatory Visit (INDEPENDENT_AMBULATORY_CARE_PROVIDER_SITE_OTHER): Payer: Medicaid Other | Admitting: Primary Care

## 2021-10-01 ENCOUNTER — Encounter (INDEPENDENT_AMBULATORY_CARE_PROVIDER_SITE_OTHER): Payer: Self-pay | Admitting: Primary Care

## 2021-10-01 VITALS — BP 144/101 | HR 66 | Temp 97.9°F | Wt 97.0 lb

## 2021-10-01 DIAGNOSIS — I10 Essential (primary) hypertension: Secondary | ICD-10-CM | POA: Diagnosis not present

## 2021-10-01 DIAGNOSIS — R6889 Other general symptoms and signs: Secondary | ICD-10-CM

## 2021-10-01 DIAGNOSIS — E43 Unspecified severe protein-calorie malnutrition: Secondary | ICD-10-CM | POA: Diagnosis not present

## 2021-10-01 DIAGNOSIS — Z09 Encounter for follow-up examination after completed treatment for conditions other than malignant neoplasm: Secondary | ICD-10-CM

## 2021-10-04 NOTE — Progress Notes (Signed)
[facility]   Subjective:   [name] is a 66 y.o. female presents for hospital/Nursing home discharge follow up . Previously in a facility for rehab and re conditioning  . 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 Today, she is doing well looks cathectic loss weight has a feeding tube remaining for supplemental feedings. It is somewhat difficult to talk (aphasia ) chew and swallow .  Patient has No headache, No chest pain, No abdominal pain - No Nausea, No new weakness tingling or numbness, No Cough -or shortness of breath.  Past Medical History:  Diagnosis Date   Asthma    Hypertension      No Known Allergies    Current Outpatient Medications on File Prior to Visit  Medication Sig Dispense Refill   atorvastatin (LIPITOR) 20 MG tablet Take 1 tablet (20 mg total) by mouth daily. 90 tablet 3   feeding supplement (ENSURE ENLIVE / ENSURE PLUS) LIQD Take 237 mLs by mouth 3 (three) times daily between meals. 237 mL 12   Fluticasone-Salmeterol (ADVAIR) 100-50 MCG/DOSE AEPB Inhale 1 puff into the lungs 2 (two) times daily. 1 each 3   HYDROcodone-acetaminophen (NORCO/VICODIN) 5-325 MG tablet Take 1 tablet by mouth every 4 (four) hours as needed for severe pain. 6 tablet 0   lisinopril (ZESTRIL) 5 MG tablet Take 5 mg by mouth daily.     metoprolol succinate (TOPROL-XL) 25 MG 24 hr tablet Take 1 tablet (25 mg total) by mouth daily. Take with or immediately following a meal. 90 tablet 3   sodium chloride 1 g tablet Take 1 g by mouth 2 (two) times daily.     acetaminophen (TYLENOL) 500 MG tablet Take 500 mg by mouth every 6 (six) hours as needed for mild pain.     albuterol (VENTOLIN HFA) 108 (90 Base) MCG/ACT inhaler INHALE 2 PUFFS BY MOUTH EVERY 6 HOURS AS NEEDED FOR WHEEZING AND SHORTNESS OF BREATH  8.5 g 2   albuterol (VENTOLIN HFA) 108 (90 Base) MCG/ACT inhaler Inhale 2 puffs into the lungs every 6 (six) hours as needed for wheezing.     amiodarone (PACERONE) 200 MG tablet Take 1 tablet (200 mg total) by mouth daily. 90 tablet 1   saline (AYR) GEL Place into both nostrils.     Vitamin D, Ergocalciferol, (DRISDOL) 1.25 MG (50000 UNIT) CAPS capsule Take 50,000 Units by mouth 2 (two) times a week. Monday and Thursday     Current Facility-Administered Medications on File Prior to Visit  Medication Dose Route Frequency Provider Last Rate Last Admin   albuterol (PROVENTIL) (2.5 MG/3ML) 0.083% nebulizer solution 2.5 mg  2.5 mg Nebulization Once Veronica Perna, NP         Review of System: Comprehensive ROS Pertinent positive and negative noted in HPI    Objective:  BP (!) 144/101 (BP Location: Left Arm, Patient Position: Sitting, Cuff Size: Small)    Pulse 66    Temp 97.9 F (36.6 C) (Oral)    Wt 97 lb (44 kg)    SpO2 95%    BMI 14.32 kg/m   Filed Weights   10/01/21 1608  Weight: 97 lb (44 kg)    Physical Exam: General Appearance: mal nourished, in no apparent distress. Eyes: PERRLA, EOMs, conjunctiva no swelling or erythema TMs without erythema, bulging. No erythema, swelling, or exudate on post pharynx.  Hearing normal.  Neck: Supple, thyroid normal.  Respiratory: Respiratory effort normal, BS equal bilaterally without rales, rhonchi, wheezing or stridor.  Cardio: RRR with no MRGs. Brisk peripheral pulses without edema.  Abdomen: Soft, + BS.  Non tender, no guarding, rebound, hernias, masses. Lymphatics: Non tender without lymphadenopathy.  Musculoskeletal: Full ROM, 5/5 strength, normal gait.  Skin: Warm, dry without rashes, lesions, ecchymosis. (Significant reconstruction of her face) Neuro: Cranial nerves intact. Normal muscle tone, no cerebellar symptoms. Sensation intact.  Psych: Awake and oriented X 3, normal affect, Insight and Judgment appropriate.    Assessment:   Veronica Stewart was seen today for nursing home discharge.  Diagnoses and all orders for this visit:  Hospital discharge follow-up Nursing home d/c continuing to follow care now she is home   Essential hypertension Diastolic elevated , systolic wnl will have patient f/u with cardiology. Recently finished therapy- increase stress . Difficulty eating correctly. Counseled on blood pressure goal of less than 130/80, low-sodium, DASH diet, medication compliance, 150 minutes of moderate intensity exercise per week. Discussed medication compliance, adverse effects.   Alteration in body image Major surgery done to her face increase risk of poor self esteem or body image   Protein-calorie malnutrition, severe Encourage increase protein , fat anything she can chew and eat sodium free.   This note has been created with Surveyor, quantity. Any transcriptional errors are unintentional.   Veronica Perna, NP 10/04/2021, 4:11 PM

## 2021-10-22 ENCOUNTER — Ambulatory Visit (INDEPENDENT_AMBULATORY_CARE_PROVIDER_SITE_OTHER): Payer: Self-pay

## 2021-10-22 ENCOUNTER — Other Ambulatory Visit (INDEPENDENT_AMBULATORY_CARE_PROVIDER_SITE_OTHER): Payer: Self-pay | Admitting: Primary Care

## 2021-10-22 NOTE — Telephone Encounter (Signed)
Sent to PCP ?

## 2021-10-22 NOTE — Telephone Encounter (Signed)
? ? ? ?  Chief Complaint: Elevated BP  174/118 ?Symptoms: No symptoms ?Frequency: Today at [place] ?Pertinent Negatives: Patient denies any symptoms ?Disposition: '[]'$ ED /'[]'$ Urgent Care (no appt availability in office) / '[]'$ Appointment(In office/virtual)/ '[]'$  [place] [facility]/ '[]'$ Home Care/ '[]'$ Refused Recommended Disposition /'[]'$ [facility]/ '[]'$  Follow-up with PCP ?Additional Notes: Levada Dy with [facility] reports pt. Had elevated BP today, no symptms. Pt. States she is out of "some of her medicines and can't swallow some of them." Unable to reach pt. To ask her specifically what she needs. Please advise.  ?Answer Assessment - Initial Assessment Questions ?1. BLOOD PRESSURE: "What is the blood pressure?" "Did you take at least two measurements 5 minutes apart?" ?    174/118 ?2. ONSET: "When did you take your blood pressure?" ?    Today ?3. HOW: "How did you obtain the blood pressure?" (e.g., visiting nurse, automatic home BP monitor) ?    Nurse at [place] ?4. HISTORY: "Do you have a history of high blood pressure?" ?    Yes ?5. MEDICATIONS: "Are you taking any medications for blood pressure?" "Have you missed any doses recently?" ?    Is out of some medications ?6. OTHER SYMPTOMS: "Do you have any symptoms?" (e.g., headache, chest pain, blurred vision, difficulty breathing, weakness) ?    No symptoms ?7. PREGNANCY: "Is there any chance you are pregnant?" "When was your last menstrual period?" ?    No ? ?Protocols used: Blood Pressure - High-A-AH ? ?

## 2021-10-22 NOTE — Telephone Encounter (Signed)
Medication Refill - Medication:lisinopril (ZESTRIL) 5 MG tablet  ? ?Has the patient contacted their pharmacy? Levada Dy from [place] [facility] called in  ?(Agent: If no, request that the patient contact the pharmacy for the refill. If patient does not wish to contact the pharmacy document the reason why and proceed with request.) ?(Agent: If yes, when and what did the pharmacy advise?)contact pcp ? ?Preferred Pharmacy (with phone number or street name): ?[place], [place] AT [facility] Phone:  [phone]  ?Fax:  [phone]  ?  ? ?Has the patient been seen for an appointment in the last year OR does the patient have an upcoming appointment? yes ? ?Agent: Please be advised that RX refills may take up to 3 business days. We ask that you follow-up with your pharmacy.  ?

## 2021-10-22 NOTE — Telephone Encounter (Signed)
Please read triage note and advise.  ?

## 2021-10-22 NOTE — Telephone Encounter (Signed)
Requested medication (s) are due for refill today: Yes ? ?Requested medication (s) are on the active medication list: Yes ? ?Last refill:  08/26/21 ? ?Future visit scheduled: No ? ?Notes to clinic:  Historical provider. ? ? ? ?Requested Prescriptions  ?Pending Prescriptions Disp Refills  ? lisinopril (ZESTRIL) 5 MG tablet    ?  Sig: Take 1 tablet (5 mg total) by mouth daily.  ?  ? Cardiovascular:  ACE Inhibitors Failed - 10/22/2021  1:05 PM  ?  ?  Failed - Last BP in normal range  ?  BP Readings from Last 1 Encounters:  ?10/01/21 (!) 144/101  ?  ?  ?  ?  Passed - Cr in normal range and within 180 days  ?  Creatinine, Ser  ?Date Value Ref Range Status  ?08/25/2021 0.94 0.44 - 1.00 mg/dL Final  ?  ?  ?  ?  Passed - K in normal range and within 180 days  ?  Potassium  ?Date Value Ref Range Status  ?08/25/2021 4.6 3.5 - 5.1 mmol/L Final  ?  ?  ?  ?  Passed - Patient is not pregnant  ?  ?  Passed - Valid encounter within last 6 months  ?  Recent Outpatient Visits   ? ?      ? 3 weeks ago Hospital discharge follow-up  ? [facility] Kerin Perna, NP  ? 7 months ago Hospital discharge follow-up  ? [facility] Kerin Perna, NP  ? 1 year ago Hospital discharge follow-up  ? [facility] Kerin Perna, NP  ? 1 year ago Benign hypertension  ? [facility] Kerin Perna, NP  ? 1 year ago Benign hypertension  ? [facility] Kerin Perna, NP  ? ?  ?  ? ?  ?  ?  ? ?

## 2021-10-23 NOTE — Telephone Encounter (Signed)
Noted  

## 2021-10-26 MED ORDER — LISINOPRIL 5 MG PO TABS: 5.0000 mg | ORAL_TABLET | Freq: Every day | ORAL | 1 refills | Status: DC

## 2021-10-27 ENCOUNTER — Telehealth: Payer: Self-pay

## 2021-10-27 NOTE — Telephone Encounter (Signed)
Patient contacted to schedule mammogram.  ? ?RE: Mobile Mammo event located at: ? ?[facility]  ?      ?[address]    ?[place] [address]    ? ?Date: April 7th at 2:20 pm  ? ?

## 2021-11-04 ENCOUNTER — Telehealth (INDEPENDENT_AMBULATORY_CARE_PROVIDER_SITE_OTHER): Payer: Self-pay | Admitting: Primary Care

## 2021-11-04 NOTE — Telephone Encounter (Signed)
Pt came to the office to drop up a form for request for personal care service, please follow up ?

## 2021-11-05 ENCOUNTER — Encounter (INDEPENDENT_AMBULATORY_CARE_PROVIDER_SITE_OTHER): Payer: Self-pay

## 2021-11-06 NOTE — Telephone Encounter (Signed)
Patient aware that forms have been completed and faxed [facility].  ?

## 2021-11-13 ENCOUNTER — Other Ambulatory Visit: Payer: Self-pay | Admitting: Primary Care

## 2021-11-13 DIAGNOSIS — Z139 Encounter for screening, unspecified: Secondary | ICD-10-CM

## 2021-12-03 ENCOUNTER — Ambulatory Visit (INDEPENDENT_AMBULATORY_CARE_PROVIDER_SITE_OTHER): Payer: Medicaid Other | Admitting: Primary Care

## 2021-12-03 ENCOUNTER — Other Ambulatory Visit (INDEPENDENT_AMBULATORY_CARE_PROVIDER_SITE_OTHER): Payer: Self-pay | Admitting: Primary Care

## 2021-12-03 ENCOUNTER — Encounter (INDEPENDENT_AMBULATORY_CARE_PROVIDER_SITE_OTHER): Payer: Self-pay | Admitting: Primary Care

## 2021-12-03 VITALS — BP 147/106 | HR 67 | Temp 98.2°F | Ht 69.0 in | Wt 97.6 lb

## 2021-12-03 DIAGNOSIS — J301 Allergic rhinitis due to pollen: Secondary | ICD-10-CM | POA: Diagnosis not present

## 2021-12-03 DIAGNOSIS — I10 Essential (primary) hypertension: Secondary | ICD-10-CM | POA: Diagnosis not present

## 2021-12-03 MED ORDER — FLUTICASONE PROPIONATE 50 MCG/ACT NA SUSP: 2.0000 | Freq: Every day | NASAL | 6 refills | Status: DC

## 2021-12-03 MED ORDER — LORATADINE 10 MG PO TABS: 10.0000 mg | ORAL_TABLET | Freq: Every day | ORAL | 11 refills | Status: DC

## 2021-12-03 MED ORDER — METOPROLOL SUCCINATE ER 25 MG PO TB24: 25.0000 mg | ORAL_TABLET | Freq: Every day | ORAL | 0 refills | Status: DC

## 2021-12-03 MED ORDER — LISINOPRIL-HYDROCHLOROTHIAZIDE 20-25 MG PO TABS: 1.0000 | ORAL_TABLET | Freq: Every day | ORAL | 0 refills | Status: DC

## 2021-12-03 NOTE — Progress Notes (Signed)
Needs eye drops because eyes "run" all the time ?Runny nose constantly from pollen ?

## 2021-12-06 NOTE — Progress Notes (Deleted)
Cardiology Office Note:    Date:  12/06/2021   ID:  Veronica Stewart, DOB Sep 25, 1955, MRN [id number]  PCP:  Kerin Perna, NP  Cardiologist:  Donato Heinz, MD  Electrophysiologist:  None   Referring MD: Kerin Perna, NP   No chief complaint on file.    History of Present Illness:    Veronica Stewart is a 66 y.o. female with a hx of atrial flutter, PAD status post stent pop bypass, hypertension, asthma who presents for follow-up.  She was admitted to [facility] on 02/11/2021 with epistaxis and new onset atrial flutter.  Anticoagulation was initially held due to her epistaxis and she was evaluated by ENT.  Underwent packing in epistaxis resolved, and she was started on Eliquis, which he tolerated well.  She had been on aspirin/Plavix for her PAD given her left external iliac stenting in August 2021.  She was transitioned to Eliquis monotherapy.  Echocardiogram 02/13/2020 showed EF 40 to 45%.  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  She completed adjuvant chemotherapy and is undergoing radiation.  Since last clinic visit,  she reports that she has been doing okay.  Found to have right nasal squamous cell carcinoma, planning surgery 10/14 at [facility].  She stopped taking Eliquis due to epistaxis.  Denies any chest pain, dyspnea, lightheadedness, syncope, lower extremity edema, or palpitations.   Past Medical History:  Diagnosis Date   Asthma    Hypertension     Past Surgical History:  Procedure Laterality Date   ABDOMINAL AORTOGRAM W/LOWER EXTREMITY Bilateral 03/05/2020   Procedure: ABDOMINAL AORTOGRAM W/LOWER EXTREMITY;  Surgeon: Marty Heck, MD;  Location: [facility];  Service: Cardiovascular;  Laterality: Bilateral;   ENDARTERECTOMY FEMORAL Left 03/24/2020   Procedure: LEFT COMMON FEMORAL  ENDARTERECTOMY;  Surgeon: Marty Heck, MD;  Location: [facility];  Service: Vascular;  Laterality: Left;   ENDOVEIN HARVEST OF GREATER SAPHENOUS VEIN Left 03/24/2020   Procedure: HARVEST OF GREATER SAPHENOUS VEIN;  Surgeon: Marty Heck, MD;  Location: [place];  Service: Vascular;  Laterality: Left;   FEMORAL-POPLITEAL BYPASS GRAFT Left 03/24/2020   Procedure: BYPASS GRAFT FEMORAL-POPLITEAL ARTERY;  Surgeon: Marty Heck, MD;  Location: [place];  Service: Vascular;  Laterality: Left;   INSERTION OF ILIAC STENT Left 03/24/2020   Procedure: INSERTION OF RETROGRADE ILIAC STENT;  Surgeon: Marty Heck, MD;  Location: [facility] [place];  Service: Vascular;  Laterality: Left;   INTRAMEDULLARY (IM) NAIL INTERTROCHANTERIC Left 03/18/2018   Procedure: INTRAMEDULLARY (IM) NAIL INTERTROCHANTRIC;  Surgeon: Shona Needles, MD;  Location: [place];  Service: Orthopedics;  Laterality: Left;   INTRAOPERATIVE ARTERIOGRAM Left 03/24/2020   Procedure: INTRA OPERATIVE ARTERIOGRAM;  Surgeon: Marty Heck, MD;  Location: [place];  Service: Vascular;  Laterality: Left;    Current Medications: No outpatient medications have been marked as taking for the 12/09/21 encounter (Appointment) with Donato Heinz, MD.   Current Facility-Administered Medications for the 12/09/21 encounter (Appointment) with Donato Heinz, MD  Medication   albuterol (PROVENTIL) (2.5 MG/3ML) 0.083% nebulizer solution 2.5 mg     Allergies:   Patient has no known allergies.   Social History   Socioeconomic History   Marital status: Legally Separated    Spouse name: Not on file   Number of children: Not on file   Years of education: Not on file  Highest education level: Not on file  Occupational History   Not on file  Tobacco Use   Smoking status: Every Day    Packs/day: 0.50    Types: Cigarettes   Smokeless tobacco: Never  Vaping Use   Vaping Use: Never used  Substance and Sexual Activity   Alcohol use:  Yes    Comment: two 40 oz beers most days    Drug use: Not Currently    Types: Marijuana   Sexual activity: Not on file  Other Topics Concern   Not on file  Social History Narrative   Not on file   Social Determinants of Health   Financial Resource Strain: Not on file  Food Insecurity: Not on file  Transportation Needs: Not on file  Physical Activity: Not on file  Stress: Not on file  Social Connections: Not on file     Family History: [name] is negative for Sudden Cardiac Death.  ROS:   Please see the history of present illness.    (+) nose bleeds (+) cold  All other systems reviewed and are negative.  EKGs/Labs/Other Studies Reviewed:    The following studies were reviewed today:   EKG:   05/11/21: Normal sinus rhythm, rate 73, Q waves V1-4 07/22:sinus rhythm, rate 63, Q-wave V1-V4  Recent Labs: 02/11/2021: TSH 2.239 02/16/2021: Magnesium 1.9 03/11/2021: ALT 7 08/25/2021: BUN 19; Creatinine, Ser 0.94; Hemoglobin 8.4; Platelets 208; Potassium 4.6; Sodium 132  Recent Lipid Panel    Component Value Date/Time   CHOL 89 03/25/2020 0216   CHOL 173 03/26/2019 1450   TRIG 31 03/25/2020 0216   HDL 44 03/25/2020 0216   HDL 102 03/26/2019 1450   CHOLHDL 2.0 03/25/2020 0216   VLDL 6 03/25/2020 0216   LDLCALC 39 03/25/2020 0216   LDLCALC 48 03/26/2019 1450    Physical Exam:    VS:  There were no vitals taken for this visit.    Wt Readings from Last 3 Encounters:  12/03/21 97 lb 9.6 oz (44.3 kg)  10/01/21 97 lb (44 kg)  08/25/21 101 lb 6.6 oz (46 kg)     GEN:  Well nourished, well developed in no acute distress HEENT: Normal NECK: No JVD; No carotid bruits LYMPHATICS: No lymphadenopathy CARDIAC: RRR, no murmurs, rubs, gallops RESPIRATORY:  Clear to auscultation without rales, wheezing or rhonchi  ABDOMEN: Soft, non-tender, non-distended MUSCULOSKELETAL:  No edema; No deformity  SKIN: Warm and dry NEUROLOGIC:  Alert and oriented x  3 PSYCHIATRIC:  Normal affect   ASSESSMENT:    No diagnosis found.   PLAN:     Atrial Flutter: Noted during recent admission 01/2021, rates up to 140s.  CHADSVASc score 4 (CHF, HTN, PAD, female).  Echo shows EF 40 to 45% -Given she was going in and out of atrial flutter during admission, was started on amiodarone to maintain sinus rhythm.  Appears to be maintaining sinus rhythm on amiodarone 200 mg daily.  Will continue amiodarone and check Zio patch x7 days to evaluate atrial flutter burden -Eliquis currently on hold given epistaxis  -She recently stopped taking her metoprolol.  Would restart Toprol-XL 25 mg daily   Chronic combined systolic and diastolic heart failure: Echo 02/12/2021 shows EF 40 to 45%.  New diagnosis heart failure.  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 -Restart Toprol-XL as above -Losartan held due to hyperkalemia -Will plan cardiac MRI to evaluate etiology of nonischemic cardiomyopathy.  Currently appears  well compensated in regards to her heart failure, this does not need to be done prior to her surgery   CAD: Coronary CTA 04/14/2021 showed calcium score 234 (93rd percentile), moderate nonobstructive CAD by CT FFR. -Started on atorvastatin 20 mg daily but stopped taking.  Would restart atorvastatin.  LDL 39 on 03/25/2020.  Epistaxis: reports long history of freq nose bleeds. Has had to have packing placed now with three recent episodes.  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  Improved to 137 on 03/26/2021   HTN: Recently started lisinopril-HCTZ 20-25 mg daily.   PAD s/p fem-pop bypass: with left external iliac stenting 8/21. Has been on ASA/plavix since that time.  During admission in June 2022, aspirin/Plavix were discontinued and switched to Eliquis monotherapy given her new atrial flutter as above.  Eliquis currently held in  setting of epistaxis from nasal cavity SCC, plan to restart as soon as able postoperatively   Anemia: in the setting of acute blood loss from nose bleed 01/2021. Hgb down to 7.5 during admission, improved to 10.8 on 03/08/2021.    RTC in***   Medication Adjustments/Labs and Tests Ordered: Current medicines are reviewed at length with the patient today.  Concerns regarding medicines are outlined above.  No orders of the defined types were placed in this encounter.    No orders of the defined types were placed in this encounter.     There are no Patient Instructions on file for this visit.     Signed, Donato Heinz, MD  12/06/2021 8:41 PM    [place]

## 2021-12-06 NOTE — Progress Notes (Signed)
?[place] ? ?Veronica Stewart, is a 66 y.o. female ? ?CHE:[id number] ? ?NTI:[id number] ? ?DOB - 04/10/1956 ? ?Chief Complaint  ?Patient presents with  ? Blood Pressure Check  ?    ? ?Subjective:  ? ?Veronica Stewart is a 66 y.o. female here today for a follow up visit. She is having problems with the pollen and watery itching eyes. Patient has No headache, No chest pain, No abdominal pain - No Nausea, No new weakness tingling or numbness, No Cough - shortness of breath ? ?No problems updated. ? ?No Known Allergies ? ?Past Medical History:  ?Diagnosis Date  ? Asthma   ? Hypertension   ? ? ?Current Outpatient Medications on File Prior to Visit  ?Medication Sig Dispense Refill  ? acetaminophen (TYLENOL) 500 MG tablet Take 500 mg by mouth every 6 (six) hours as needed for mild pain.    ? albuterol (VENTOLIN HFA) 108 (90 Base) MCG/ACT inhaler INHALE 2 PUFFS BY MOUTH EVERY 6 HOURS AS NEEDED FOR WHEEZING AND SHORTNESS OF BREATH 8.5 g 2  ? atorvastatin (LIPITOR) 20 MG tablet Take 1 tablet (20 mg total) by mouth daily. 90 tablet 3  ? feeding supplement (ENSURE ENLIVE / ENSURE PLUS) LIQD Take 237 mLs by mouth 3 (three) times daily between meals. 237 mL 12  ? Fluticasone-Salmeterol (ADVAIR) 100-50 MCG/DOSE AEPB Inhale 1 puff into the lungs 2 (two) times daily. 1 each 3  ? ondansetron (ZOFRAN) 8 MG tablet Take 8 mg by mouth 3 (three) times daily.    ? pantoprazole (PROTONIX) 40 MG tablet Take 40 mg by mouth 2 (two) times daily.    ? saline (AYR) GEL Place into both nostrils.    ? sodium chloride 1 g tablet Take 1 g by mouth 2 (two) times daily.    ? Vitamin D, Ergocalciferol, (DRISDOL) 1.25 MG (50000 UNIT) CAPS capsule Take 50,000 Units by mouth 2 (two) times a week. Monday and Thursday    ? amiodarone (PACERONE) 200 MG tablet Take 1 tablet (200 mg total) by mouth daily. 90 tablet 1  ? HYDROcodone-acetaminophen (NORCO/VICODIN) 5-325 MG tablet Take 1 tablet by mouth every 4 (four) hours as needed for severe pain.  (Patient not taking: Reported on 12/03/2021) 6 tablet 0  ? ?Current Facility-Administered Medications on File Prior to Visit  ?Medication Dose Route Frequency Provider Last Rate Last Admin  ? albuterol (PROVENTIL) (2.5 MG/3ML) 0.083% nebulizer solution 2.5 mg  2.5 mg Nebulization Once Kerin Perna, NP      ? ?Comprehensive ROS Pertinent positive and negative noted in HPI   ?Objective:  ? ?Vitals:  ? 12/03/21 1536 12/03/21 1554  ?BP: (!) 159/98 (!) 147/106  ?Pulse: 67 67  ?Temp: 98.2 ?F (36.8 ?C)   ?TempSrc: Oral   ?SpO2: 100%   ?Weight: 97 lb 9.6 oz (44.3 kg)   ?Height: '5\' 9"'$  (1.753 m)   ? ? ?Exam ?General appearance : Awake, alert, not in any distress. Speech Clear. Not toxic looking ?HEENT: Atraumatic and Normocephalic, pupils equally reactive to light and accomodation ?Neck: Supple, no JVD. No cervical lymphadenopathy.  ?Chest: Good air entry bilaterally, no added sounds  ?CVS: S1 S2 regular, no murmurs.  ?Abdomen: Bowel sounds present, Non tender and not distended with no gaurding, rigidity or rebound. ?Extremities: B/L Lower Ext shows no edema, both legs are warm to touch ?Neurology: Awake alert, and oriented X 3, CN II-XII intact, Non focal ?Skin: No Rash ? ?Data Review ?Lab Results  ?Component Value Date  ?  HGBA1C 5.2 03/26/2019  ? ? ?Assessment & Plan  ? ?1. Essential hypertension ?Donato Heinz, MD reach out to him explain cancer , surgery and Bp was extremely elevated - in agreement with medication prescribed and f/u with them as soon as possible . Patient also given the number to cardiology to make an urgent appt. ?- Ambulatory referral to Cardiology ?- metoprolol succinate (TOPROL-XL) 25 MG 24 hr tablet; Take 1 tablet (25 mg total) by mouth daily. Take with or immediately following a meal.  Dispense: 30 tablet; Refill: 0 ?- lisinopril-hydrochlorothiazide (ZESTORETIC) 20-25 MG tablet; Take 1 tablet by mouth daily.  Dispense: 30 tablet; Refill: 0 ? ?2. Seasonal allergic rhinitis due to  pollen ?- loratadine (CLARITIN) 10 MG tablet; Take 1 tablet (10 mg total) by mouth daily.  Dispense: 30 tablet; Refill: 11 ?- fluticasone (FLONASE) 50 MCG/ACT nasal spray; Place 2 sprays into both nostrils daily.  Dispense: 16 g; Refill: 6 ? ? ? ?Patient have been counseled extensively about nutrition and exercise. Other issues discussed during this visit include: low cholesterol diet, weight control and daily exercise, foot care, annual eye examinations at Ophthalmology, importance of adherence with medications and regular follow-up. We also discussed long term complications of uncontrolled diabetes and hypertension.  ? ?No follow-ups on file. ? ?The patient was given clear instructions to go to ER or return to medical center if symptoms don't improve, worsen or new problems develop. The patient verbalized understanding. The patient was told to call to get lab results if they haven't heard anything in the next week.  ? ?This note has been created with Surveyor, quantity. Any transcriptional errors are unintentional.  ? ?Kerin Perna, NP ?12/06/2021, 7:35 PM  ?

## 2021-12-09 ENCOUNTER — Ambulatory Visit: Payer: Medicaid Other | Admitting: Cardiology

## 2021-12-09 ENCOUNTER — Other Ambulatory Visit (INDEPENDENT_AMBULATORY_CARE_PROVIDER_SITE_OTHER): Payer: Self-pay | Admitting: Primary Care

## 2021-12-10 ENCOUNTER — Other Ambulatory Visit (INDEPENDENT_AMBULATORY_CARE_PROVIDER_SITE_OTHER): Payer: Self-pay | Admitting: Primary Care

## 2021-12-10 NOTE — Telephone Encounter (Addendum)
Patient was advised by pharmacy to contact PCP office regarding refilling her amiodarone (PACERONE) 200 MG tablet  and sodium chloride 1 g tablet.  ? ?Patient states the initial pre scriber was a physician at a nursing home. Patient is no longer at a nursing home and was advised to call PCP office.  ? ?[facility] [place] [place] [facility] Phone:  [phone]  ?Fax:  [phone]  ?  ? ?

## 2021-12-10 NOTE — Telephone Encounter (Signed)
Sent to PCP ?

## 2021-12-11 NOTE — Telephone Encounter (Signed)
Requested medication (s) are due for refill today: Yes ? ?Requested medication (s) are on the active medication list: Yes ? ?Last refill:  Pacerone - 03/11/21   sodium choride -  08/15/21  Historical provider. ? ?Future visit scheduled: No ? ?Notes to clinic:  See request. ? ? ? ?Requested Prescriptions  ?Pending Prescriptions Disp Refills  ? amiodarone (PACERONE) 200 MG tablet 90 tablet 1  ?  Sig: Take 1 tablet (200 mg total) by mouth daily.  ?  ? Not Delegated - Cardiovascular: Antiarrhythmic Agents - amiodarone Failed - 12/11/2021 10:32 AM  ?  ?  Failed - This refill cannot be delegated  ?  ?  Failed - Manual Review: Eye exam recommended every 12 months  ?  ?  Failed - AST in normal range and within 180 days  ?  AST  ?Date Value Ref Range Status  ?03/11/2021 12 0 - 40 IU/L Final  ?  ?  ?  ?  Failed - ALT in normal range and within 180 days  ?  ALT  ?Date Value Ref Range Status  ?03/11/2021 7 0 - 32 IU/L Final  ?  ?  ?  ?  Failed - Patient had ECG in the last 180 days  ?  ?  Failed - Last BP in normal range  ?  BP Readings from Last 1 Encounters:  ?12/03/21 (!) 147/106  ?  ?  ?  ?  Failed - Patient had chest x-ray within the last 6 months  ?  ?  Passed - TSH in normal range and within 360 days  ?  TSH  ?Date Value Ref Range Status  ?02/11/2021 2.239 0.350 - 4.500 uIU/mL Final  ?  Comment:  ?  Performed at [facility], [place] [address] [place], [place] [address]  ?  ?  ?  ?  Passed - Mg Level in normal range and within 360 days  ?  Magnesium  ?Date Value Ref Range Status  ?02/16/2021 1.9 1.7 - 2.4 mg/dL Final  ?  Comment:  ?  Performed at [place] [address], [place], [place] [address]  ?  ?  ?  ?  Passed - K in normal range and within 180 days  ?  Potassium  ?Date Value Ref Range Status  ?08/25/2021 4.6 3.5 - 5.1 mmol/L Final  ?  ?  ?  ?  Passed - Patient is not pregnant  ?  ?  Passed - Last Heart Rate in normal range  ?  Pulse Readings from Last 1 Encounters:  ?12/03/21 67  ?  ?  ?  ?  Passed -  Valid encounter within last 6 months  ?  Recent Outpatient Visits   ? ?      ? 1 week ago Essential hypertension  ? [facility] Kerin Perna, NP  ? 2 months ago Hospital discharge follow-up  ? [facility] Kerin Perna, NP  ? 9 months ago Hospital discharge follow-up  ? [facility] Kerin Perna, NP  ? 1 year ago Hospital discharge follow-up  ? [facility] Kerin P[facility], NP  ? 1 year ago Benign hypertension  ? [facility] Kerin Perna, NP  ? ?  ?  ? ? ?  ?  ?  ? sodium chloride 1 g tablet    ?  Sig: Take  1 tablet (1 g total) by mouth 2 (two) times daily.  ?  ? Off-Protocol Failed - 12/11/2021 10:32 AM  ?  ?  Failed - Medication not assigned to a protocol, review manually.  ?  ?  Passed - Valid encounter within last 12 months  ?  Recent Outpatient Visits   ? ?      ? 1 week ago Essential hypertension  ? [facility] Kerin Perna, NP  ? 2 months ago Hospital discharge follow-up  ? [facility] Kerin Perna, NP  ? 9 months ago Hospital discharge follow-up  ? [facility] Kerin Perna, NP  ? 1 year ago Hospital discharge follow-up  ? [facility] Kerin Perna, NP  ? 1 year ago Benign hypertension  ? [facility] Kerin Perna, NP  ? ?  ?  ? ? ?  ?  ?  ?Refused Prescriptions Disp Refills  ? amiodarone (PACERONE) 200 MG tablet [Pharmacy Med Name: AMIODARONE '200MG'$  TABLETS] 90 tablet 1  ?  Sig: TAKE 1 TABLET BY MOUTH ONCE DAILY  ?  ? Not Delegated - Cardiovascular: Antiarrhythmic Agents - amiodarone Failed - 12/11/2021 10:32 AM  ?  ?  Failed - This refill cannot be delegated  ?  ?  Failed - Manual Review: Eye exam recommended every 12 months  ?  ?  Failed - AST in normal range and within 180 days  ?  AST  ?Date Value Ref Range Status   ?03/11/2021 12 0 - 40 IU/L Final  ?  ?  ?  ?  Failed - ALT in normal range and within 180 days  ?  ALT  ?Date Value Ref Range Status  ?03/11/2021 7 0 - 32 IU/L Final  ?  ?  ?  ?  Failed - Patient had ECG in the last 180 days  ?  ?  Failed - Last BP in normal range  ?  BP Readings from Last 1 Encounters:  ?12/03/21 (!) 147/106  ?  ?  ?  ?  Failed - Patient had chest x-ray within the last 6 months  ?  ?  Passed - TSH in normal range and within 360 days  ?  TSH  ?Date Value Ref Range Status  ?02/11/2021 2.239 0.350 - 4.500 uIU/mL Final  ?  Comment:  ?  Performed at [facility], [place] [address] [place], [place] [address]  ?  ?  ?  ?  Passed - Mg Level in normal range and within 360 days  ?  Magnesium  ?Date Value Ref Range Status  ?02/16/2021 1.9 1.7 - 2.4 mg/dL Final  ?  Comment:  ?  Performed at [place] [address], [place], [place] [address]  ?  ?  ?  ?  Passed - K in normal range and within 180 days  ?  Potassium  ?Date Value Ref Range Status  ?08/25/2021 4.6 3.5 - 5.1 mmol/L Final  ?  ?  ?  ?  Passed - Patient is not pregnant  ?  ?  Passed - Last Heart Rate in normal range  ?  Pulse Readings from Last 1 Encounters:  ?12/03/21 67  ?  ?  ?  ?  Passed - Valid encounter within last 6 months  ?  Recent Outpatient Visits   ? ?      ?  1 week ago Essential hypertension  ? [facility] Ker[facility], NP  ? 2 months ago Hospital discharge follow-up  ? [facility] Kerin Perna, NP  ? 9 months ago Hospital discharge follow-up  ? [facility] Ker[facility] P[facility], NP  ? 1 year ago Hospital discharge follow-up  ? [facility] Kerin Perna, NP  ? 1 year ago Benign hypertension  ? [facility] Kerin Perna, NP  ? ?  ?  ? ? ?  ?  ?  ? sodium chloride 1 g tablet [Pharmacy Med Name: SODIUM CHLORIDE 1GM TABLETS] 60 tablet   ?  Sig: TAKE 1 TABLET BY MOUTH TWICE DAILY  ?  ?  Off-Protocol Failed - 12/11/2021 10:32 AM  ?  ?  Failed - Medication not assigned to a protocol, review manually.  ?  ?  Passed - Valid encounter within last 12 months  ?  Recent Outpatient Visits   ? ?      ? 1 week ago Essential hypertension  ? [facility] Kerin Perna, NP  ? 2 months ago Hospital discharge follow-up  ? [facility] Kerin Perna, NP  ? 9 months ago Hospital discharge follow-up  ? [facility] Kerin Perna, NP  ? 1 year ago Hospital discharge follow-up  ? [facility] Kerin Perna, NP  ? 1 year ago Benign hypertension  ? [facility] Kerin Perna, NP  ? ?  ?  ? ? ?  ?  ?  ? ?

## 2021-12-11 NOTE — Addendum Note (Signed)
Addended by: Matilde Sprang on: 12/11/2021 10:32 AM ? ? Modules accepted: Orders ? ?

## 2021-12-15 ENCOUNTER — Encounter: Payer: Self-pay | Admitting: Cardiology

## 2021-12-17 ENCOUNTER — Other Ambulatory Visit (INDEPENDENT_AMBULATORY_CARE_PROVIDER_SITE_OTHER): Payer: Self-pay | Admitting: Primary Care

## 2021-12-17 NOTE — Telephone Encounter (Signed)
Medication Refill - Medication: atorvastatin (LIPITOR) 20 MG tablet,sodium chloride 1 g tablet ? ?Has the patient contacted their pharmacy? No. ? ?(Agent: If no, request that the patient contact the pharmacy for the refill. If patient does not wish to contact the pharmacy document the reason why and proceed with request.) ? ? ?Preferred Pharmacy (with phone number or street name):  ?[place], [place] AT [facility]  ?[place] [address]  ?Phone: [phone] Fax: [phone]  ?Hours: Not open 24 hours  ? ?Has the patient been seen for an appointment in the last year OR does the patient have an upcoming appointment? Yes.   ? ?Agent: Please be advised that RX refills may take up to 3 business days. We ask that you follow-up with your pharmacy.  ?

## 2021-12-17 NOTE — Telephone Encounter (Signed)
Requested medications are due for refill today.  Lipitor is too soon, unsure about NaCl. ? ?Requested medications are on the active medications list.  yes ? ?Last refill. Lipitor 05/11/2021 #90 with 3 refills. NaCl 08/15/2021 ? ?Future visit scheduled.   no ? ?Notes to clinic.  Labs are expired for Lipitor refill. NaCl rx was written by historical provider. ? ? ? ?Requested Prescriptions  ?Pending Prescriptions Disp Refills  ? atorvastatin (LIPITOR) 20 MG tablet 90 tablet 3  ?  Sig: Take 1 tablet (20 mg total) by mouth daily.  ?  ? Cardiovascular:  Antilipid - Statins Failed - 12/17/2021  2:25 PM  ?  ?  Failed - Lipid Panel in normal range within the last 12 months  ?  Cholesterol, Total  ?Date Value Ref Range Status  ?03/26/2019 173 100 - 199 mg/dL Final  ? ?Cholesterol  ?Date Value Ref Range Status  ?03/25/2020 89 0 - 200 mg/dL Final  ? ?LDL Calculated  ?Date Value Ref Range Status  ?03/26/2019 48 0 - 99 mg/dL Final  ? ?LDL Cholesterol  ?Date Value Ref Range Status  ?03/25/2020 39 0 - 99 mg/dL Final  ?  Comment:  ?         ?Total Cholesterol/HDL:CHD Risk ?Coronary Heart Disease Risk Table ?                    Men   Women ? 1/2 Average Risk   3.4   3.3 ? Average Risk       5.0   4.4 ? 2 X Average Risk   9.6   7.1 ? 3 X Average Risk  23.4   11.0 ?       ?Use the calculated Patient Ratio ?above and the CHD Risk Table ?to determine the patient's CHD Risk. ?       ?ATP III CLASSIFICATION (LDL): ? <100     mg/dL   Optimal ? 100-129  mg/dL   Near or Above ?                   Optimal ? 130-159  mg/dL   Borderline ? 160-189  mg/dL   High ? >190     mg/dL   Very High ?Performed at [facility], [place] [address], [place], [place] [address] ?  ? ?HDL  ?Date Value Ref Range Status  ?03/25/2020 44 >40 mg/dL Final  ?03/26/2019 102 >39 mg/dL Final  ? ?Triglycerides  ?Date Value Ref Range Status  ?03/25/2020 31 <150 mg/dL Final  ? ?  ?  ?  Passed - Patient is not pregnant  ?  ?  Passed - Valid encounter within last 12 months  ?   Recent Outpatient Visits   ? ?      ? 2 weeks ago Essential hypertension  ? [facility] Kerin Perna, NP  ? 2 months ago Hospital discharge follow-up  ? [facility] Kerin P[facility], NP  ? 9 months ago Hospital discharge follow-up  ? [facility] Kerin Perna, NP  ? 1 year ago Hospital discharge follow-up  ? [facility] Kerin Perna, NP  ? 1 year ago Benign hypertension  ? [facility] Kerin Perna, NP  ? ?  ?  ?Future Appointments   ? ?        ? In 3 weeks Donato Heinz, MD  [facility]  ? ?  ? ? ?  ?  ?  ? sodium chloride 1 g tablet    ?  Sig: Take 1 tablet (1 g total) by mouth 2 (two) times daily.  ?  ? Off-Protocol Failed - 12/17/2021  2:25 PM  ?  ?  Failed - Medication not assigned to a protocol, review manually.  ?  ?  Passed - Valid encounter within last 12 months  ?  Recent Outpatient Visits   ? ?      ? 2 weeks ago Essential hypertension  ? [facility] Kerin Perna, NP  ? 2 months ago Hospital discharge follow-up  ? [facility] Kerin Perna, NP  ? 9 months ago Hospital discharge follow-up  ? [facility] Kerin Perna, NP  ? 1 year ago Hospital discharge follow-up  ? [facility] Kerin Perna, NP  ? 1 year ago Benign hypertension  ? [facility] Kerin Perna, NP  ? ?  ?  ?Future Appointments   ? ?        ? In 3 weeks Donato Heinz, MD [facility]  ? ?  ? ? ?  ?  ?  ?  ?

## 2022-01-06 ENCOUNTER — Other Ambulatory Visit (INDEPENDENT_AMBULATORY_CARE_PROVIDER_SITE_OTHER): Payer: Self-pay | Admitting: Primary Care

## 2022-01-06 DIAGNOSIS — I10 Essential (primary) hypertension: Secondary | ICD-10-CM

## 2022-01-06 DIAGNOSIS — J301 Allergic rhinitis due to pollen: Secondary | ICD-10-CM

## 2022-01-06 MED ORDER — METOPROLOL SUCCINATE ER 25 MG PO TB24: 25.0000 mg | ORAL_TABLET | Freq: Every day | ORAL | 1 refills | Status: DC

## 2022-01-06 MED ORDER — LISINOPRIL-HYDROCHLOROTHIAZIDE 20-25 MG PO TABS: 1.0000 | ORAL_TABLET | Freq: Every day | ORAL | 1 refills | Status: DC

## 2022-01-06 MED ORDER — LORATADINE 10 MG PO TABS: 10.0000 mg | ORAL_TABLET | Freq: Every day | ORAL | 11 refills | Status: DC

## 2022-01-06 NOTE — Telephone Encounter (Signed)
Medication Refill - Medication: metoprolol succinate (TOPROL-XL) 25 MG 24 hr tablet loratadine (CLARITIN) 10 MG tablet  sodium chloride 1 g tablet amiodarone (PACERONE) 200 MG tablet  Has the patient contacted their pharmacy? Yes.   (Agent: If no, request that the patient contact the pharmacy for the refill. If patient does not wish to contact the pharmacy document the reason why and proceed with request.) (Agent: If yes, when and what did the pharmacy advise?)  Preferred Pharmacy (with phone number or street name):  [place], [place] [address] [place] [address] [facility]  [place] [address]  Phone: [phone] Fax: [phone]   Has the patient been seen for an appointment in the last year OR does the patient have an upcoming appointment? Yes.    Agent: Please be advised that RX refills may take up to 3 business days. We ask that you follow-up with your pharmacy.

## 2022-01-08 ENCOUNTER — Ambulatory Visit: Payer: Medicaid Other | Admitting: Cardiology

## 2022-01-08 NOTE — Progress Notes (Unsigned)
Cardiology Office Note:    Date:  01/08/2022   ID:  Veronica Stewart, DOB [birth year], MRN [id number]  PCP:  Kerin Perna, NP  Cardiologist:  Donato Heinz, MD  Electrophysiologist:  None   Referring MD: Kerin Perna, NP   No chief complaint on file.    History of Present Illness:    Veronica Stewart is a 66 y.o. female with a hx of atrial flutter, PAD status post stent pop bypass, hypertension, asthma who presents for follow-up.  She was admitted to [facility] on 02/11/2021 with epistaxis and new onset atrial flutter.  Anticoagulation was initially held due to her epistaxis and she was evaluated by ENT.  Underwent packing in epistaxis resolved, and she was started on Eliquis, which he tolerated well.  She had been on aspirin/Plavix for her PAD given her left external iliac stenting in August 2021.  She was transitioned to Eliquis monotherapy.  Echocardiogram 02/13/2020 showed EF 40 to 45%.  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  She completed adjuvant chemotherapy and is undergoing radiation.  Since last clinic visit,  she reports that she has been doing okay.  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  She stopped taking Eliquis due to epistaxis.  Denies any chest pain, dyspnea, lightheadedness, syncope, lower extremity edema, or palpitations.   Past Medical History:  Diagnosis Date   Asthma    Hypertension     Past Surgical History:  Procedure Laterality Date   ABDOMINAL AORTOGRAM W/LOWER EXTREMITY Bilateral 03/05/2020   Procedure: ABDOMINAL AORTOGRAM W/LOWER EXTREMITY;  Surgeon: Marty Heck, MD;  Location: [place] [facility];  Service: Cardiovascular;  Laterality: Bilateral;   ENDARTERECTOMY FEMORAL Left 03/24/2020   Procedure: LEFT COMMON FEMORAL  ENDARTERECTOMY;  Surgeon: Marty Heck, MD;  Location: [place];  Service: Vascular;  Laterality: Left;   ENDOVEIN HARVEST OF GREATER SAPHENOUS VEIN Left 03/24/2020   Procedure: HARVEST OF GREATER SAPHENOUS VEIN;  Surgeon: Marty Heck, MD;  Location: [place];  Service: Vascular;  Laterality: Left;   FEMORAL-POPLITEAL BYPASS GRAFT Left 03/24/2020   Procedure: BYPASS GRAFT FEMORAL-POPLITEAL ARTERY;  Surgeon: Marty Heck, MD;  Location: [place];  Service: Vascular;  Laterality: Left;   INSERTION OF ILIAC STENT Left 03/24/2020   Procedure: INSERTION OF RETROGRADE ILIAC STENT;  Surgeon: Marty Heck, MD;  Location: [place];  Service: Vascular;  Laterality: Left;   INTRAMEDULLARY (IM) NAIL INTERTROCHANTERIC Left 03/18/2018   Procedure: INTRAMEDULLARY (IM) NAIL INTERTROCHANTRIC;  Surgeon: Shona Needles, MD;  Location: [place];  Service: Orthopedics;  Laterality: Left;   INTRAOPERATIVE ARTERIOGRAM Left 03/24/2020   Procedure: INTRA OPERATIVE ARTERIOGRAM;  Surgeon: Marty Heck, MD;  Location: [place];  Service: Vascular;  Laterality: Left;    Current Medications: No outpatient medications have been marked as taking for the 01/08/22 encounter (Appointment) with Donato Heinz, MD.   Current Facility-Administered Medications for the 01/08/22 encounter (Appointment) with Donato Heinz, MD  Medication   albuterol (PROVENTIL) (2.5 MG/3ML) 0.083% nebulizer solution 2.5 mg     Allergies:   Patient has no known allergies.   Social History   Socioeconomic History   Marital status: Legally Separated    Spouse name: Not on file   Number of children: Not on file   Years of education: Not on file  Highest education level: Not on file  Occupational History   Not on file  Tobacco Use   Smoking status: Every Day    Packs/day: 0.50    Types: Cigarettes   Smokeless tobacco: Never  Vaping Use   Vaping Use: Never used  Substance and Sexual Activity   Alcohol use:  Yes    Comment: two 40 oz beers most days    Drug use: Not Currently    Types: Marijuana   Sexual activity: Not on file  Other Topics Concern   Not on file  Social History Narrative   Not on file   Social Determinants of Health   Financial Resource Strain: Not on file  Food Insecurity: Not on file  Transportation Needs: Not on file  Physical Activity: Not on file  Stress: Not on file  Social Connections: Not on file     Family History: [name] is negative for Sudden Cardiac Death.  ROS:   Please see the history of present illness.    (+) nose bleeds (+) cold  All other systems reviewed and are negative.  EKGs/Labs/Other Studies Reviewed:    The following studies were reviewed today:   EKG:   05/11/21: Normal sinus rhythm, rate 73, Q waves V1-4 07/22:sinus rhythm, rate 63, Q-wave V1-V4  Recent Labs: 02/11/2021: TSH 2.239 02/16/2021: Magnesium 1.9 03/11/2021: ALT 7 08/25/2021: BUN 19; Creatinine, Ser 0.94; Hemoglobin 8.4; Platelets 208; Potassium 4.6; Sodium 132  Recent Lipid Panel    Component Value Date/Time   CHOL 89 03/25/2020 0216   CHOL 173 03/26/2019 1450   TRIG 31 03/25/2020 0216   HDL 44 03/25/2020 0216   HDL 102 03/26/2019 1450   CHOLHDL 2.0 03/25/2020 0216   VLDL 6 03/25/2020 0216   LDLCALC 39 03/25/2020 0216   LDLCALC 48 03/26/2019 1450    Physical Exam:    VS:  There were no vitals taken for this visit.    Wt Readings from Last 3 Encounters:  12/03/21 97 lb 9.6 oz (44.3 kg)  10/01/21 97 lb (44 kg)  08/25/21 101 lb 6.6 oz (46 kg)     GEN:  Well nourished, well developed in no acute distress HEENT: Normal NECK: No JVD; No carotid bruits LYMPHATICS: No lymphadenopathy CARDIAC: RRR, no murmurs, rubs, gallops RESPIRATORY:  Clear to auscultation without rales, wheezing or rhonchi  ABDOMEN: Soft, non-tender, non-distended MUSCULOSKELETAL:  No edema; No deformity  SKIN: Warm and dry NEUROLOGIC:  Alert and oriented x  3 PSYCHIATRIC:  Normal affect   ASSESSMENT:    No diagnosis found.   PLAN:     Atrial Flutter: Noted during recent admission 01/2021, rates up to 140s.  CHADSVASc score 4 (CHF, HTN, PAD, female).  Echo shows EF 40 to 45% -Given she was going in and out of atrial flutter during admission, was started on amiodarone to maintain sinus rhythm.  Appears to be maintaining sinus rhythm on amiodarone 200 mg daily.  Will continue amiodarone and check Zio patch x7 days to evaluate atrial flutter burden -Eliquis currently on hold given epistaxis  -She recently stopped taking her metoprolol.  Would restart Toprol-XL 25 mg daily   Chronic combined systolic and diastolic heart failure: Echo 02/12/2021 shows EF 40 to 45%.  New diagnosis heart failure.  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 -Restart Toprol-XL as above -Losartan held due to hyperkalemia -Will plan cardiac MRI to evaluate etiology of nonischemic cardiomyopathy.  Currently appears  well compensated in regards to her heart failure, this does not need to be done prior to her surgery   CAD: Coronary CTA 04/14/2021 showed calcium score 234 (93rd percentile), moderate nonobstructive CAD by CT FFR. -Started on atorvastatin 20 mg daily but stopped taking.  Would restart atorvastatin.  LDL 39 on 03/25/2020.  Epistaxis: reports long history of freq nose bleeds. Has had to have packing placed now with three recent episodes.  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  Improved to 137 on 03/26/2021   HTN: Recently started lisinopril-HCTZ 20-25 mg daily.   PAD s/p fem-pop bypass: with left external iliac stenting 8/21. Has been on ASA/plavix since that time.  During admission in June 2022, aspirin/Plavix were discontinued and switched to Eliquis monotherapy given her new atrial flutter as above.  Eliquis currently held in  setting of epistaxis from nasal cavity SCC, plan to restart as soon as able postoperatively   Anemia: in the setting of acute blood loss from nose bleed 01/2021. Hgb down to 7.5 during admission, improved to 10.8 on 03/08/2021.    RTC in***   Medication Adjustments/Labs and Tests Ordered: Current medicines are reviewed at length with the patient today.  Concerns regarding medicines are outlined above.  No orders of the defined types were placed in this encounter.    No orders of the defined types were placed in this encounter.     There are no Patient Instructions on file for this visit.     Signed, Donato Heinz, MD  01/08/2022 6:33 AM    [place]

## 2022-02-09 ENCOUNTER — Other Ambulatory Visit (HOSPITAL_BASED_OUTPATIENT_CLINIC_OR_DEPARTMENT_OTHER): Payer: Self-pay

## 2022-02-09 ENCOUNTER — Ambulatory Visit (INDEPENDENT_AMBULATORY_CARE_PROVIDER_SITE_OTHER): Payer: 59 | Admitting: Cardiology

## 2022-02-09 ENCOUNTER — Ambulatory Visit (INDEPENDENT_AMBULATORY_CARE_PROVIDER_SITE_OTHER): Payer: 59

## 2022-02-09 VITALS — BP 183/106 | HR 55 | Ht 69.0 in | Wt 105.0 lb

## 2022-02-09 DIAGNOSIS — I739 Peripheral vascular disease, unspecified: Secondary | ICD-10-CM | POA: Diagnosis not present

## 2022-02-09 DIAGNOSIS — I4892 Unspecified atrial flutter: Secondary | ICD-10-CM | POA: Diagnosis not present

## 2022-02-09 DIAGNOSIS — I5042 Chronic combined systolic (congestive) and diastolic (congestive) heart failure: Secondary | ICD-10-CM | POA: Diagnosis not present

## 2022-02-09 DIAGNOSIS — I10 Essential (primary) hypertension: Secondary | ICD-10-CM | POA: Diagnosis not present

## 2022-02-09 DIAGNOSIS — D649 Anemia, unspecified: Secondary | ICD-10-CM

## 2022-02-09 MED ORDER — AMLODIPINE BESYLATE 5 MG PO TABS
5.0000 mg | ORAL_TABLET | Freq: Every day | ORAL | 3 refills | Status: DC
Start: 2022-02-09 — End: 2022-02-10

## 2022-02-09 NOTE — Progress Notes (Unsigned)
Enrolled patient for a 14 day Zio XT  monitor to be mailed to patients home  °

## 2022-02-10 ENCOUNTER — Other Ambulatory Visit: Payer: Self-pay | Admitting: *Deleted

## 2022-02-10 ENCOUNTER — Telehealth: Payer: Self-pay | Admitting: *Deleted

## 2022-02-10 DIAGNOSIS — Z79899 Other long term (current) drug therapy: Secondary | ICD-10-CM

## 2022-02-10 DIAGNOSIS — N289 Disorder of kidney and ureter, unspecified: Secondary | ICD-10-CM

## 2022-02-10 DIAGNOSIS — I10 Essential (primary) hypertension: Secondary | ICD-10-CM

## 2022-02-10 LAB — COMPREHENSIVE METABOLIC PANEL
ALT: 11 IU/L (ref 0–32)
AST: 16 IU/L (ref 0–40)
Albumin/Globulin Ratio: 1.2 (ref 1.2–2.2)
Albumin: 4.6 g/dL (ref 3.8–4.8)
Alkaline Phosphatase: 77 IU/L (ref 44–121)
BUN/Creatinine Ratio: 19 (ref 12–28)
BUN: 27 mg/dL (ref 8–27)
Bilirubin Total: 0.3 mg/dL (ref 0.0–1.2)
CO2: 24 mmol/L (ref 20–29)
Calcium: 10 mg/dL (ref 8.7–10.3)
Chloride: 101 mmol/L (ref 96–106)
Creatinine, Ser: 1.44 mg/dL — ABNORMAL HIGH (ref 0.57–1.00)
Globulin, Total: 3.9 g/dL (ref 1.5–4.5)
Glucose: 71 mg/dL (ref 70–99)
Potassium: 5.4 mmol/L — ABNORMAL HIGH (ref 3.5–5.2)
Sodium: 139 mmol/L (ref 134–144)
Total Protein: 8.5 g/dL (ref 6.0–8.5)
eGFR: 40 mL/min/{1.73_m2} — ABNORMAL LOW (ref >59)

## 2022-02-10 LAB — CBC
Hematocrit: 37.9 % (ref 34.0–46.6)
Hemoglobin: 12 g/dL (ref 11.1–15.9)
MCH: 25.1 pg — ABNORMAL LOW (ref 26.6–33.0)
MCHC: 31.7 g/dL (ref 31.5–35.7)
MCV: 79 fL (ref 79–97)
Platelets: 244 10*3/uL (ref 150–450)
RBC: 4.79 x10E6/uL (ref 3.77–5.28)
RDW: 17.9 % — ABNORMAL HIGH (ref 11.7–15.4)
WBC: 6.5 10*3/uL (ref 3.4–10.8)

## 2022-02-10 LAB — TSH: TSH: 1.95 u[IU]/mL (ref 0.450–4.500)

## 2022-02-10 MED ORDER — AMLODIPINE BESYLATE 5 MG PO TABS: 10.0000 mg | ORAL_TABLET | Freq: Every day | ORAL | 3 refills | Status: DC

## 2022-02-10 MED ORDER — LISINOPRIL-HYDROCHLOROTHIAZIDE 20-25 MG PO TABS: 0.5000 | ORAL_TABLET | Freq: Every day | ORAL | 1 refills | Status: DC

## 2022-02-10 NOTE — Telephone Encounter (Signed)
Spoke to Production assistant, radio at Dr. Lupe Carney office [facility] ENT)-requesting clearance to restart Eliquis.   Message send to MD and will call back.   OV note faxed to Dr. Rachel Moulds via [place].

## 2022-02-11 MED ORDER — APIXABAN 5 MG PO TABS: 5.0000 mg | ORAL_TABLET | Freq: Two times a day (BID) | ORAL | 3 refills | Status: DC

## 2022-02-11 NOTE — Telephone Encounter (Signed)
[name] at Dr. Lupe Carney office [facility]) calling back. Transferred to [name], RN

## 2022-02-11 NOTE — Telephone Encounter (Signed)
Per Dr. Salvadore Oxford Eliquis 5 mg BID.   Patient aware and verbalized understanding.  Rx sent to pharmacy.

## 2022-02-11 NOTE — Telephone Encounter (Signed)
Received fax from Dr. Rachel Moulds, ok to restart Eliquis from ENT standpoint.

## 2022-02-12 DIAGNOSIS — I4892 Unspecified atrial flutter: Secondary | ICD-10-CM | POA: Diagnosis not present

## 2022-02-22 ENCOUNTER — Ambulatory Visit (INDEPENDENT_AMBULATORY_CARE_PROVIDER_SITE_OTHER): Payer: 59

## 2022-02-22 DIAGNOSIS — I5042 Chronic combined systolic (congestive) and diastolic (congestive) heart failure: Secondary | ICD-10-CM | POA: Diagnosis not present

## 2022-02-22 LAB — ECHOCARDIOGRAM COMPLETE
AR max vel: 2.21 cm2
AV Area VTI: 2.18 cm2
AV Area mean vel: 2.11 cm2
AV Mean grad: 3 mmHg
AV Peak grad: 5.5 mmHg
AV Vena cont: 0.32 cm
Ao pk vel: 1.17 m/s
Area-P 1/2: 3.21 cm2
MV M vel: 4.01 m/s
MV Peak grad: 64.3 mmHg
S' Lateral: 2.36 cm

## 2022-02-23 LAB — BASIC METABOLIC PANEL
BUN/Creatinine Ratio: 13 (ref 12–28)
BUN: 23 mg/dL (ref 8–27)
CO2: 23 mmol/L (ref 20–29)
Calcium: 9.5 mg/dL (ref 8.7–10.3)
Chloride: 105 mmol/L (ref 96–106)
Creatinine, Ser: 1.76 mg/dL — ABNORMAL HIGH (ref 0.57–1.00)
Glucose: 80 mg/dL (ref 70–99)
Potassium: 5.3 mmol/L — ABNORMAL HIGH (ref 3.5–5.2)
Sodium: 143 mmol/L (ref 134–144)
eGFR: 32 mL/min/{1.73_m2} — ABNORMAL LOW (ref >59)

## 2022-02-23 MED ORDER — HYDRALAZINE HCL 25 MG PO TABS: 25.0000 mg | ORAL_TABLET | Freq: Three times a day (TID) | ORAL | 3 refills | Status: DC

## 2022-02-23 NOTE — Addendum Note (Signed)
Addended by: [name] A on: 02/23/2022 04:39 PM   Modules accepted: Orders

## 2022-02-24 ENCOUNTER — Encounter: Payer: Self-pay | Admitting: Pharmacist

## 2022-02-24 ENCOUNTER — Ambulatory Visit (INDEPENDENT_AMBULATORY_CARE_PROVIDER_SITE_OTHER): Payer: 59 | Admitting: Pharmacist

## 2022-02-24 VITALS — BP 129/90 | HR 69 | Ht 69.0 in | Wt 104.0 lb

## 2022-02-24 DIAGNOSIS — I10 Essential (primary) hypertension: Secondary | ICD-10-CM | POA: Diagnosis not present

## 2022-02-24 NOTE — Patient Instructions (Addendum)
It was nice meeting you today  We would like your blood pressure to be less than 130/80  Please continue your amlodipine '5mg'$  and your metoprolol '25mg'$  once daily  Remember to pick up your hydralazine at the pharmacy.  You will take 1 tablet three times a day  I want you to continue to check your blood pressure at home once you start the new medicine.  If you feel dizzy or lightheaded please let us know. We may need to reduce the hydralazine  Remember to recheck your lab work in 1 week around Wednesday July 19th  Please call with any questions  Karren Cobble, PharmD, [place], [place], [place], [place], [place], [address] Phone: [phone], Fax: [phone]

## 2022-02-24 NOTE — Progress Notes (Signed)
Patient ID: Veronica Stewart                 DOB: February 08, 1956                      MRN: [id number]     HPI: [name] is a 66 y.o. female referred by Dr. Gardiner Rhyme to HTN clinic. PMH is significant for a flutter, PAD, HTN, CAD, elevated coronary calcium, nasal squamous cell carcinoma, and CHF.   Patient previously on lisinopril/HCTZ however BMP showed elevated Scr and was discontinued yesterday.  Placed on hydralazine.  Patient presents today in good spirits. Very frail. Has not been able to get to pharmacy today to pick up hydralzine yet but has discontinued lisinopril/HCTZ.  Brought all medication bottles with her.  Has an Equate wrist BP cuff but is not sure how to use it.  Looking at the device memory, had 3 readings saved:  111/72 124/88 136/103  Battery died while looking at memory.  Changed batteries in device for patient.  Patient reports she does not add salt to food. Smokes 7 cigarettes a day. Walks with a cane and is difficult for her to exercise. Eats all meals at home. Denies alcohol use.  Current HTN meds:  Amlodipine '5mg'$  daily Hydralazine '25mg'$  TID (not started yet) Toprol '25mg'$  daily  BP goal: <130/80  Wt Readings from Last 3 Encounters:  02/09/22 105 lb (47.6 kg)  12/03/21 97 lb 9.6 oz (44.3 kg)  10/01/21 97 lb (44 kg)   BP Readings from Last 3 Encounters:  02/09/22 (!) 183/106  12/03/21 (!) 147/106  10/01/21 (!) 144/101   Pulse Readings from Last 3 Encounters:  02/09/22 (!) 55  12/03/21 67  10/01/21 66    Renal function: CrCl cannot be calculated (Unknown ideal weight.).  Past Medical History:  Diagnosis Date   Asthma    Hypertension     Current Outpatient Medications on File Prior to Visit  Medication Sig Dispense Refill   acetaminophen (TYLENOL) 500 MG tablet Take 500 mg by mouth every 6 (six) hours as needed for mild pain.     albuterol (VENTOLIN HFA) 108 (90 Base) MCG/ACT inhaler INHALE 2 PUFFS BY MOUTH EVERY 6 HOURS AS NEEDED FOR WHEEZING  AND SHORTNESS OF BREATH 8.5 g 2   amiodarone (PACERONE) 200 MG tablet Take 1 tablet (200 mg total) by mouth daily. 90 tablet 1   amLODipine (NORVASC) 5 MG tablet Take 2 tablets (10 mg total) by mouth daily. 90 tablet 3   apixaban (ELIQUIS) 5 MG TABS tablet Take 1 tablet (5 mg total) by mouth 2 (two) times daily. 60 tablet 3   atorvastatin (LIPITOR) 20 MG tablet Take 1 tablet (20 mg total) by mouth daily. (Patient not taking: Reported on 02/09/2022) 90 tablet 3   DEEP SEA NASAL SPRAY 0.65 % nasal spray SMARTSIG:Both Nares     feeding supplement (ENSURE ENLIVE / ENSURE PLUS) LIQD Take 237 mLs by mouth 3 (three) times daily between meals. 237 mL 12   fluticasone (FLONASE) 50 MCG/ACT nasal spray Place 2 sprays into both nostrils daily. 16 g 6   Fluticasone-Salmeterol (ADVAIR) 100-50 MCG/DOSE AEPB Inhale 1 puff into the lungs 2 (two) times daily. 1 each 3   hydrALAZINE (APRESOLINE) 25 MG tablet Take 1 tablet (25 mg total) by mouth 3 (three) times daily. 270 tablet 3   loratadine (CLARITIN) 10 MG tablet Take 1 tablet (10 mg total) by mouth daily. 30 tablet 11   metoprolol  succinate (TOPROL-XL) 25 MG 24 hr tablet Take 1 tablet (25 mg total) by mouth daily. Take with or immediately following a meal. 90 tablet 1   ondansetron (ZOFRAN) 8 MG tablet Take 8 mg by mouth 3 (three) times daily.     pantoprazole (PROTONIX) 40 MG tablet TAKE 1 TABLET BY MOUTH TWICE DAILY 180 tablet 0   sodium chloride 1 g tablet Take 1 g by mouth 2 (two) times daily.     Current Facility-Administered Medications on File Prior to Visit  Medication Dose Route Frequency Provider Last Rate Last Admin   albuterol (PROVENTIL) (2.5 MG/3ML) 0.083% nebulizer solution 2.5 mg  2.5 mg Nebulization Once Kerin Perna, NP        No Known Allergies   Assessment/Plan:  1. Hypertension -  Patient BP in room 129/90 which is above goal of <130/80.  Taught patient how to use wrist cuff.  After replacing batteries, patient was able to get a  reading of 138/100.  Rechecked BP using office machine: 126/86.  Home cuff appears to read ~10 points higher on systolic and diastolic.    Patient systolic BP at goal however diastolic elevated. May be able to reduce with hydralazine. Gave patient signs and symptoms of hypotension and advised to reach out to [place] if she feels any symptoms and hydralazine dose could be reduced.  Patient voiced understanding. Will continue to monitor at home.  Reminded patient to recheck BMP in 1 week.  Wrote down indications for all medications on bottles. Patient grateful for assistance.  Continue amlodipine '5mg'$  daily Continue Toprol 25 mg daily Start hydralazine '25mg'$  TID Recheck BMP in 1 week Recheck as needed  Veronica Stewart, PharmD, [place], [address], [place], [place], [place], [address] Phone: [phone], Fax: [phone]

## 2022-03-03 ENCOUNTER — Other Ambulatory Visit: Payer: Self-pay

## 2022-03-03 DIAGNOSIS — I10 Essential (primary) hypertension: Secondary | ICD-10-CM

## 2022-03-03 DIAGNOSIS — N289 Disorder of kidney and ureter, unspecified: Secondary | ICD-10-CM

## 2022-03-03 DIAGNOSIS — Z79899 Other long term (current) drug therapy: Secondary | ICD-10-CM

## 2022-03-05 ENCOUNTER — Ambulatory Visit (INDEPENDENT_AMBULATORY_CARE_PROVIDER_SITE_OTHER): Payer: 59 | Admitting: Podiatry

## 2022-03-05 DIAGNOSIS — M79674 Pain in right toe(s): Secondary | ICD-10-CM

## 2022-03-05 DIAGNOSIS — B351 Tinea unguium: Secondary | ICD-10-CM

## 2022-03-05 DIAGNOSIS — M79675 Pain in left toe(s): Secondary | ICD-10-CM | POA: Diagnosis not present

## 2022-03-05 DIAGNOSIS — Z7901 Long term (current) use of anticoagulants: Secondary | ICD-10-CM | POA: Diagnosis not present

## 2022-03-05 NOTE — Progress Notes (Unsigned)
Subjective: 66 y.o. returns the office today for painful, elongated, thickened toenails which they cannot trim themself. Denies any redness or drainage around the nails. Denies any acute changes since last appointment and no new complaints today. Denies any systemic complaints such as fevers, chills, nausea, vomiting.   PCP: Kerin Perna, NP  Objective: AAO 3, NAD DP/PT pulses palpable, CRT less than 3 seconds Protective sensation *** with Derrel Nip monofilament, Achilles tendon reflex intact.  Nails hypertrophic, dystrophic, elongated, brittle, discolored ***. There is tenderness overlying the nails 1-5 bilaterally. There is no surrounding erythema or drainage along the nail sites. No open lesions or pre-ulcerative lesions are identified. No other areas of tenderness bilateral lower extremities. No overlying edema, erythema, increased warmth. No pain with calf compression, swelling, warmth, erythema.  Assessment: Patient presents with symptomatic onychomycosis  Plan: -Treatment options including alternatives, risks, complications were discussed -Nails sharply debrided *** without complication/bleeding. -Discussed daily foot inspection. If there are any changes, to call the office immediately.  -Follow-up in 3 months or sooner if any problems are to arise. In the meantime, encouraged to call the office with any questions, concerns, changes symptoms.  Celesta Gentile, DPM

## 2022-03-09 LAB — BASIC METABOLIC PANEL

## 2022-03-10 ENCOUNTER — Other Ambulatory Visit: Payer: Self-pay | Admitting: *Deleted

## 2022-03-10 DIAGNOSIS — N289 Disorder of kidney and ureter, unspecified: Secondary | ICD-10-CM

## 2022-03-10 DIAGNOSIS — Z79899 Other long term (current) drug therapy: Secondary | ICD-10-CM

## 2022-03-10 DIAGNOSIS — I10 Essential (primary) hypertension: Secondary | ICD-10-CM

## 2022-03-10 NOTE — Addendum Note (Signed)
Addended by: [name] A on: 03/10/2022 11:31 AM   Modules accepted: Orders

## 2022-03-15 LAB — BASIC METABOLIC PANEL
BUN/Creatinine Ratio: 14 (ref 12–28)
BUN: 16 mg/dL (ref 8–27)
CO2: 27 mmol/L (ref 20–29)
Calcium: 9.4 mg/dL (ref 8.7–10.3)
Chloride: 102 mmol/L (ref 96–106)
Creatinine, Ser: 1.17 mg/dL — ABNORMAL HIGH (ref 0.57–1.00)
Glucose: 72 mg/dL (ref 70–99)
Potassium: 5.1 mmol/L (ref 3.5–5.2)
Sodium: 141 mmol/L (ref 134–144)
eGFR: 52 mL/min/{1.73_m2} — ABNORMAL LOW (ref >59)

## 2022-03-16 ENCOUNTER — Other Ambulatory Visit: Payer: Self-pay | Admitting: Cardiology

## 2022-03-22 NOTE — Progress Notes (Signed)
Cardiology Office Note:    Date:  04/04/2022   ID:  Veronica Stewart, DOB July 20, 1956, MRN [id number]  PCP:  Kerin Perna, NP  Cardiologist:  Donato Heinz, MD  Electrophysiologist:  None   Referring MD: Kerin Perna, NP   Chief Complaint  Patient presents with   Atrial Flutter     History of Present Illness:    Veronica Stewart is a 66 y.o. female with a hx of atrial flutter, PAD status post stent pop bypass, hypertension, asthma who presents for follow-up.  She was admitted to [facility] on 02/11/2021 with epistaxis and new onset atrial flutter.  Anticoagulation was initially held due to her epistaxis and she was evaluated by ENT.  Underwent packing in epistaxis resolved, and she was started on Eliquis, which he tolerated well.  She had been on aspirin/Plavix for her PAD given her left external iliac stenting in August 2021.  She was transitioned to Eliquis monotherapy.  Echocardiogram 02/13/2020 showed EF 40 to 45%.  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  She completed adjuvant chemotherapy and radiation.  Echocardiogram 02/22/2022 showed EF 55 to 03%, normal diastolic function, normal RV function, RVSP 41 mmHg, mild MR, mild to moderate TR.  Zio patch was worn for 9 days 02/2022 the only had 1 day of analysis time, no significant arrhythmias were seen.   Since last clinic visit, she reports that she is doing well.  Denies any chest pain, dyspnea, lower extremity edema, or palpitations.  Does report she was having dizziness when taking hydralazine 25 mg 3 times daily, decrease dose to once daily.  She started Eliquis, denies any bleeding issues.  Past Medical History:  Diagnosis Date   Asthma    Hypertension     Past Surgical History:  Procedure Laterality Date   ABDOMINAL AORTOGRAM W/LOWER EXTREMITY Bilateral  03/05/2020   Procedure: ABDOMINAL AORTOGRAM W/LOWER EXTREMITY;  Surgeon: Marty Heck, MD;  Location: [facility];  Service: Cardiovascular;  Laterality: Bilateral;   ENDARTERECTOMY FEMORAL Left 03/24/2020   Procedure: LEFT COMMON FEMORAL ENDARTERECTOMY;  Surgeon: Marty Heck, MD;  Location: [facility];  Service: Vascular;  Laterality: Left;   ENDOVEIN HARVEST OF GREATER SAPHENOUS VEIN Left 03/24/2020   Procedure: HARVEST OF GREATER SAPHENOUS VEIN;  Surgeon: Marty Heck, MD;  Location: [facility];  Service: Vascular;  Laterality: Left;   FEMORAL-POPLITEAL BYPASS GRAFT Left 03/24/2020   Procedure: BYPASS GRAFT FEMORAL-POPLITEAL ARTERY;  Surgeon: Marty Heck, MD;  Location: [facility];  Service: Vascular;  Laterality: Left;   INSERTION OF ILIAC STENT Left 03/24/2020   Procedure: INSERTION OF RETROGRADE ILIAC STENT;  Surgeon: Marty Heck, MD;  Location: [facility];  Service: Vascular;  Laterality: Left;   INTRAMEDULLARY (IM) NAIL INTERTROCHANTERIC Left 03/18/2018   Procedure: INTRAMEDULLARY (IM) NAIL INTERTROCHANTRIC;  Surgeon: Shona Needles, MD;  Location: [facility];  Service: Orthopedics;  Laterality: Left;   INTRAOPERATIVE ARTERIOGRAM Left 03/24/2020   Procedure: INTRA OPERATIVE ARTERIOGRAM;  Surgeon: Marty Heck, MD;  Location: [facility];  Service: Vascular;  Laterality: Left;    Current Medications: Current Meds  Medication Sig   acetaminophen (TYLENOL) 500 MG tablet Take 500 mg by mouth every 6 (six) hours as needed for mild pain.   albuterol (VENTOLIN HFA) 108 (90 Base) MCG/ACT inhaler INHALE 2 PUFFS BY MOUTH EVERY 6 HOURS AS NEEDED  FOR WHEEZING AND SHORTNESS OF BREATH   amiodarone (PACERONE) 200 MG tablet TAKE 1 TABLET(200 MG) BY MOUTH DAILY   amLODipine (NORVASC) 5 MG tablet Take 2 tablets (10 mg total) by mouth daily.   apixaban (ELIQUIS) 5 MG TABS tablet Take 1 tablet (5 mg total) by mouth 2 (two) times daily.   atorvastatin (LIPITOR) 20 MG tablet Take 1 tablet  (20 mg total) by mouth daily.   DEEP SEA NASAL SPRAY 0.65 % nasal spray SMARTSIG:Both Nares   feeding supplement (ENSURE ENLIVE / ENSURE PLUS) LIQD Take 237 mLs by mouth 3 (three) times daily between meals.   fluticasone (FLONASE) 50 MCG/ACT nasal spray Place 2 sprays into both nostrils daily.   Fluticasone-Salmeterol (ADVAIR) 100-50 MCG/DOSE AEPB Inhale 1 puff into the lungs 2 (two) times daily.   loratadine (CLARITIN) 10 MG tablet Take 1 tablet (10 mg total) by mouth daily.   metoprolol succinate (TOPROL-XL) 25 MG 24 hr tablet Take 1 tablet (25 mg total) by mouth daily. Take with or immediately following a meal.   ondansetron (ZOFRAN) 8 MG tablet Take 8 mg by mouth 3 (three) times daily.   sodium chloride 1 g tablet Take 1 g by mouth 2 (two) times daily.   [DISCONTINUED] hydrALAZINE (APRESOLINE) 25 MG tablet Take 1 tablet (25 mg total) by mouth 3 (three) times daily.   [DISCONTINUED] pantoprazole (PROTONIX) 40 MG tablet TAKE 1 TABLET BY MOUTH TWICE DAILY   Current Facility-Administered Medications for the 03/23/22 encounter (Office Visit) with Donato Heinz, MD  Medication   albuterol (PROVENTIL) (2.5 MG/3ML) 0.083% nebulizer solution 2.5 mg     Allergies:   Patient has no known allergies.   Social History   Socioeconomic History   Marital status: Legally Separated    Spouse name: Not on file   Number of children: Not on file   Years of education: Not on file   Highest education level: Not on file  Occupational History   Not on file  Tobacco Use   Smoking status: Every Day    Packs/day: 0.50    Types: Cigarettes   Smokeless tobacco: Never  Vaping Use   Vaping Use: Never used  Substance and Sexual Activity   Alcohol use: Yes    Comment: two 40 oz beers most days    Drug use: Not Currently    Types: Marijuana   Sexual activity: Not on file  Other Topics Concern   Not on file  Social History Narrative   Not on file   Social Determinants of Health   Financial  Resource Strain: Not on file  Food Insecurity: Not on file  Transportation Needs: Not on file  Physical Activity: Not on file  Stress: Not on file  Social Connections: Not on file     Family History: [name] is negative for Sudden Cardiac Death.  ROS:   Please see the history of present illness.      All other systems reviewed and are negative.  EKGs/Labs/Other Studies Reviewed:    The following studies were reviewed today:   EKG:   03/23/22: Sinus rhythm, rate 60, first-degree AV block, poor R wave progression, Q waves in V1-3 02/09/2022: Sinus bradycardia, rate 55, Q waves in V1-3 05/11/21: Normal sinus rhythm, rate 73, Q waves V1-4 07/22:sinus rhythm, rate 63, Q-wave V1-V4  Recent Labs: 02/09/2022: ALT 11; Hemoglobin 12.0; Platelets 244; TSH 1.950 03/15/2022: BUN 16; Creatinine, Ser 1.17; Potassium 5.1; Sodium 141  Recent Lipid Panel  Component Value Date/Time   CHOL 89 03/25/2020 0216   CHOL 173 03/26/2019 1450   TRIG 31 03/25/2020 0216   HDL 44 03/25/2020 0216   HDL 102 03/26/2019 1450   CHOLHDL 2.0 03/25/2020 0216   VLDL 6 03/25/2020 0216   LDLCALC 39 03/25/2020 0216   LDLCALC 48 03/26/2019 1450    Physical Exam:    VS:  BP 130/80   Pulse 60   Ht '5\' 9"'$  (1.753 m)   Wt 105 lb 6.4 oz (47.8 kg)   SpO2 98%   BMI 15.56 kg/m     Wt Readings from Last 3 Encounters:  03/23/22 105 lb 6.4 oz (47.8 kg)  02/24/22 104 lb (47.2 kg)  02/09/22 105 lb (47.6 kg)     GEN: Cachectic, in no acute distress NECK: No JVD CARDIAC: RRR, no murmurs, rubs, gallops RESPIRATORY:  Clear to auscultation without rales, wheezing or rhonchi  ABDOMEN: Soft, non-tender, non-distended MUSCULOSKELETAL:  No edema; SKIN: Warm and dry NEUROLOGIC:  Alert and oriented x 3 PSYCHIATRIC:  Normal affect   ASSESSMENT:    1. Atrial flutter, unspecified type ([place])   2. Chronic combined systolic and diastolic heart failure ([place])   3. Essential hypertension   4. PAD  (peripheral artery disease) (H[facility])       PLAN:    Atrial Flutter: Noted during admission 01/2021, rates up to 140s.  CHADSVASc score 4 (CHF, HTN, PAD, female).  Echo 02/12/21 shows EF 40 to 45%.  Echocardiogram 02/22/2022 showed EF 55 to 32%, normal diastolic function, normal RV function, RVSP 41 mmHg, mild MR, mild to moderate TR.  Zio patch was worn for 9 days 02/2022 the only had 1 day of analysis time, no significant arrhythmias were seen. -Given she was going in and out of atrial flutter during admission, was started on amiodarone to maintain sinus rhythm.  Appears to be maintaining sinus rhythm on amiodarone 200 mg daily.  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  Recommend referral to EP to evaluate for ablation -Continue Eliquis 5 mg twice daily -Continue Toprol-XL 25 mg daily   Chronic combined systolic and diastolic heart failure: Echo 02/12/2021 shows EF 40 to 45%.  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  Echocardiogram 02/22/2022 showed EF 55 to 99%, normal diastolic function, normal RV function, RVSP 41 mmHg, mild MR, mild to moderate TR. 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  Currently on amlodipine 10 mg daily, hydralazine 25 mg 3 times daily and Toprol-XL 25 mg daily -BP appears controlled.  She reported lightheadedness on hydralazine 25 mg 3 times daily and decrease her dose to once daily.  Recommend twice daily hydralazine.  Asked patient to check BP daily for next 2 weeks and call with results.   PAD s/p fem-pop bypass: with left external iliac stenting 8/21. Has been  on ASA/plavix since that time.  During admission in June 2022, aspirin/Plavix were discontinued and switched to Eliquis monotherapy given her new atrial flutter as above.  Continue Eliquis, atorvastatin   Anemia: in the setting of acute blood loss from epistaxis related to her nasal cavity malignancy as above.  Resolved, most recent hemoglobin 12.0 on  02/09/2022  Hyperlipidemia: LDL 65 on 08/18/2021.  Continue atorvastatin 20 mg daily.  AKI: Creatinine up to 1.76 on 02/22/2022.  Likely due to lisinopril-HCTZ.  Medication discontinued, improvement in creatinine to 1.17 on 03/15/2022   RTC in 3 months   Medication Adjustments/Labs and Tests Ordered: Current medicines are reviewed at length with the patient today.  Concerns regarding medicines are outlined above.  Orders Placed This Encounter  Procedures   Ambulatory referral to Cardiac Electrophysiology   EKG 12-Lead     Meds ordered this encounter  Medications   hydrALAZINE (APRESOLINE) 25 MG tablet    Sig: Take 1 tablet (25 mg total) by mouth 2 (two) times daily.    Dispense:  180 tablet    Refill:  3      Patient Instructions  Medication Instructions:  DECREASE hydralazine to 25 mg TWO times daily  Please check your blood pressure at home twice daily, write it down.  Call the office or send message via Mychart with the readings in 2 weeks for Dr. Gardiner Rhyme to review.   *If you need a refill on your cardiac medications before your next appointment, please call your pharmacy*  Follow-Up: At [facility], you and your health needs are our priority.  As part of our continuing mission to provide you with exceptional heart care, we have created designated Provider Care Teams.  These Care Teams include your primary Cardiologist (physician) and Advanced Practice Providers (APPs -  Physician Assistants and Nurse Practitioners) who all work together to provide you with the care you need, when you need it.  We recommend signing up for the  patient portal called "MyChart".  Sign up information is provided on this After Visit Summary.  MyChart is used to connect with patients for Virtual Visits (Telemedicine).  Patients are able to view lab/test results, encounter notes, upcoming appointments, etc.  Non-urgent messages can be sent to your provider as well.   To learn more about what you can do with MyChart, go to [email or web address].    Your next appointment:   3 month(s)  The format for your next appointment:   In Person  Provider:   Sande Rives, PA-C, Caron Presume, PA-C, Jory Sims, DNP, ANP, Almyra Deforest, PA-C, or Diona Browner, NP    Then, Donato Heinz, MD will plan to see you again in 6 month(s).{   Other Instructions You have been referred to: Electrophysiologist at [facility] office --we will call the schedule this appointment.    Important Information About Sugar           Signed, Donato Heinz, MD  04/04/2022 10:35 PM    [facility]

## 2022-03-23 ENCOUNTER — Ambulatory Visit (INDEPENDENT_AMBULATORY_CARE_PROVIDER_SITE_OTHER): Payer: 59 | Admitting: Cardiology

## 2022-03-23 ENCOUNTER — Encounter: Payer: Self-pay | Admitting: Cardiology

## 2022-03-23 ENCOUNTER — Other Ambulatory Visit (INDEPENDENT_AMBULATORY_CARE_PROVIDER_SITE_OTHER): Payer: Self-pay | Admitting: Primary Care

## 2022-03-23 VITALS — BP 130/80 | HR 60 | Ht 69.0 in | Wt 105.4 lb

## 2022-03-23 DIAGNOSIS — I10 Essential (primary) hypertension: Secondary | ICD-10-CM

## 2022-03-23 DIAGNOSIS — I4892 Unspecified atrial flutter: Secondary | ICD-10-CM | POA: Diagnosis not present

## 2022-03-23 DIAGNOSIS — I739 Peripheral vascular disease, unspecified: Secondary | ICD-10-CM

## 2022-03-23 DIAGNOSIS — I5042 Chronic combined systolic (congestive) and diastolic (congestive) heart failure: Secondary | ICD-10-CM | POA: Diagnosis not present

## 2022-03-23 MED ORDER — HYDRALAZINE HCL 25 MG PO TABS: 25.0000 mg | ORAL_TABLET | Freq: Two times a day (BID) | ORAL | 3 refills | Status: DC

## 2022-03-23 NOTE — Patient Instructions (Signed)
Medication Instructions:  DECREASE hydralazine to 25 mg TWO times daily  Please check your blood pressure at home twice daily, write it down.  Call the office or send message via Mychart with the readings in 2 weeks for Dr. Gardiner Rhyme to review.   *If you need a refill on your cardiac medications before your next appointment, please call your pharmacy*  Follow-Up: At [facility], you and your health needs are our priority.  As part of our continuing mission to provide you with exceptional heart care, we have created designated Provider Care Teams.  These Care Teams include your primary Cardiologist (physician) and Advanced Practice Providers (APPs -  Physician Assistants and Nurse Practitioners) who all work together to provide you with the care you need, when you need it.  We recommend signing up for the patient portal called "MyChart".  Sign up information is provided on this After Visit Summary.  MyChart is used to connect with patients for Virtual Visits (Telemedicine).  Patients are able to view lab/test results, encounter notes, upcoming appointments, etc.  Non-urgent messages can be sent to your provider as well.   To learn more about what you can do with MyChart, go to [email or web address].    Your next appointment:   3 month(s)  The format for your next appointment:   In Person  Provider:   Sande Rives, PA-C, Caron Presume, PA-C, Jory Sims, DNP, ANP, Almyra Deforest, PA-C, or Diona Browner, NP    Then, Donato Heinz, MD will plan to see you again in 6 month(s).{   Other Instructions You have been referred to: Electrophysiologist at [facility] office --we will call the schedule this appointment.    Important Information About Sugar

## 2022-03-23 NOTE — Telephone Encounter (Signed)
Medication Refill - Medication: pantoprazole (PROTONIX) 40 MG tablet, metoprolol succinate (TOPROL-XL) 25 MG 24 hr tablet  Has the patient contacted their pharmacy? Yes.    (Agent: If yes, when and what did the pharmacy advise?) Contact PCP office  Preferred Pharmacy (with phone number or street name):   [place], [place] AT [facility] Phone:  [phone]  Fax:  [phone]     Has the patient been seen for an appointment in the last year OR does the patient have an upcoming appointment? Yes.    Agent: Please be advised that RX refills may take up to 3 business days. We ask that you follow-up with your pharmacy.

## 2022-03-24 MED ORDER — PANTOPRAZOLE SODIUM 40 MG PO TBEC: 40.0000 mg | DELAYED_RELEASE_TABLET | Freq: Two times a day (BID) | ORAL | 0 refills | Status: DC

## 2022-03-24 NOTE — Telephone Encounter (Signed)
Requested Prescriptions  Pending Prescriptions Disp Refills  . pantoprazole (PROTONIX) 40 MG tablet 180 tablet 0    Sig: Take 1 tablet (40 mg total) by mouth 2 (two) times daily.     Gastroenterology: Proton Pump Inhibitors Passed - 03/23/2022  3:34 PM      Passed - Valid encounter within last 12 months    Recent Outpatient Visits          3 months ago Essential hypertension   [place] Kerin Perna, NP   5 months ago Hospital discharge follow-up   [place], [place], NP   1 year ago Hospital discharge follow-up   [place], [place], NP   1 year ago Hospital discharge follow-up   [place], [place], NP   2 years ago Benign hypertension   [place] Kerin Perna, NP      Future Appointments            In 3 months [place], Helane Gunther, NP [facility], [place]           . metoprolol succinate (TOPROL-XL) 25 MG 24 hr tablet 90 tablet 1    Sig: Take 1 tablet (25 mg total) by mouth daily. Take with or immediately following a meal.     Cardiovascular:  Beta Blockers Passed - 03/23/2022  3:34 PM      Passed - Last BP in normal range    BP Readings from Last 1 Encounters:  03/23/22 130/80         Passed - Last Heart Rate in normal range    Pulse Readings from Last 1 Encounters:  03/23/22 60         Passed - Valid encounter within last 6 months    Recent Outpatient Visits          3 months ago Essential hypertension   [place], Michelle P, NP   5 months ago Hospital discharge follow-up   [place], [place], NP   1 year ago Hospital discharge follow-up   [place], [place], NP   1 year ago Hospital discharge follow-up   [place], [place], NP   2 years ago Benign  hypertension   [place] Kerin Perna, NP      Future Appointments            In 3 months Mon[place], Helane Gunther, NP [facility], [facility]

## 2022-05-03 ENCOUNTER — Other Ambulatory Visit: Payer: Self-pay | Admitting: Cardiology

## 2022-05-04 ENCOUNTER — Other Ambulatory Visit (INDEPENDENT_AMBULATORY_CARE_PROVIDER_SITE_OTHER): Payer: Self-pay

## 2022-05-06 ENCOUNTER — Encounter: Payer: Self-pay | Admitting: Internal Medicine

## 2022-05-06 ENCOUNTER — Ambulatory Visit: Payer: 59 | Attending: Internal Medicine | Admitting: Internal Medicine

## 2022-05-06 DIAGNOSIS — I4892 Unspecified atrial flutter: Secondary | ICD-10-CM | POA: Diagnosis not present

## 2022-05-06 DIAGNOSIS — I5022 Chronic systolic (congestive) heart failure: Secondary | ICD-10-CM

## 2022-05-06 MED ORDER — AMIODARONE HCL 200 MG PO TABS: ORAL_TABLET | ORAL | 3 refills | Status: DC

## 2022-05-06 NOTE — Patient Instructions (Addendum)
Medication Instructions:  Your physician has recommended you make the following change in your medication:  Medication Decrease:  Amiodarone ( 200 Mg )   Take one tablet ( 200 Mg ) by mouth daily Monday through Friday. Do NOT take any Amiodarone on Saturday or Sunday.   Lab Work: None ordered.  If you have labs (blood work) drawn today and your tests are completely normal, you will receive your results only by: [place] (if you have MyChart) OR A paper copy in the mail If you have any lab test that is abnormal or we need to change your treatment, we will call you to review the results.  Testing/Procedures: None ordered.  Follow-Up: At [facility], you and your health needs are our priority.  As part of our continuing mission to provide you with exceptional heart care, we have created designated Provider Care Teams.  These Care Teams include your primary Cardiologist (physician) and Advanced Practice Providers (APPs -  Physician Assistants and Nurse Practitioners) who all work together to provide you with the care you need, when you need it.  We recommend signing up for the patient portal called "MyChart".  Sign up information is provided on this After Visit Summary.  MyChart is used to connect with patients for Virtual Visits (Telemedicine).  Patients are able to view lab/test results, encounter notes, upcoming appointments, etc.  Non-urgent messages can be sent to your provider as well.   To learn more about what you can do with MyChart, go to [email or web address].    Your next appointment:   AS NEEDED  The format for your next appointment:   In Person  Provider:   Cristopher Peru, MD{or one of the following Advanced Practice Providers on your designated Care Team:   Tommye Standard, [place] Legrand Como "Jonni Sanger" Chalmers Cater, [place]   Important Information About Sugar     \

## 2022-05-06 NOTE — Progress Notes (Signed)
HPI Veronica Stewart is referred by Dr. Gardiner Rhyme for evaluation of atrial flutter. She has a h/o nasal CA and s/p resection. She presented in 6/22 with probable atypical atrial flutter and has been treated with amiodarone and eliquis. She appears to be maintaining NSR. She denies palpitations and has not had any bleeding since her nasal surgery. She has gained back 10 lbs. She is still smoking. No edema.  No Known Allergies   Current Outpatient Medications  Medication Sig Dispense Refill   acetaminophen (TYLENOL) 500 MG tablet Take 500 mg by mouth every 6 (six) hours as needed for mild pain.     albuterol (VENTOLIN HFA) 108 (90 Base) MCG/ACT inhaler INHALE 2 PUFFS BY MOUTH EVERY 6 HOURS AS NEEDED FOR WHEEZING AND SHORTNESS OF BREATH 8.5 g 2   amiodarone (PACERONE) 200 MG tablet Take one tablet ( 200 Mg ) by mouth daily Monday through Friday. Do NOT take any Amiodarone on Saturday or Sunday. 90 tablet 3   amLODipine (NORVASC) 5 MG tablet TAKE 1 TABLET(5 MG) BY MOUTH DAILY 90 tablet 3   apixaban (ELIQUIS) 5 MG TABS tablet Take 1 tablet (5 mg total) by mouth 2 (two) times daily. 60 tablet 3   atorvastatin (LIPITOR) 20 MG tablet Take 1 tablet (20 mg total) by mouth daily. (Patient not taking: Reported on 05/06/2022) 90 tablet 3   DEEP SEA NASAL SPRAY 0.65 % nasal spray SMARTSIG:Both Nares     feeding supplement (ENSURE ENLIVE / ENSURE PLUS) LIQD Take 237 mLs by mouth 3 (three) times daily between meals. 237 mL 12   fluticasone (FLONASE) 50 MCG/ACT nasal spray Place 2 sprays into both nostrils daily. 16 g 6   Fluticasone-Salmeterol (ADVAIR) 100-50 MCG/DOSE AEPB Inhale 1 puff into the lungs 2 (two) times daily. 1 each 3   hydrALAZINE (APRESOLINE) 25 MG tablet Take 1 tablet (25 mg total) by mouth 2 (two) times daily. (Patient taking differently: Take 25 mg by mouth 3 (three) times daily.) 180 tablet 3   loratadine (CLARITIN) 10 MG tablet Take 1 tablet (10 mg total) by mouth daily. 30 tablet 11    metoprolol succinate (TOPROL-XL) 25 MG 24 hr tablet Take 1 tablet (25 mg total) by mouth daily. Take with or immediately following a meal. 90 tablet 1   ondansetron (ZOFRAN) 8 MG tablet Take 8 mg by mouth 3 (three) times daily.     pantoprazole (PROTONIX) 40 MG tablet Take 1 tablet (40 mg total) by mouth 2 (two) times daily. 180 tablet 0   sodium chloride 1 g tablet Take 1 g by mouth 2 (two) times daily.     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   Past Surgical History:  Procedure Laterality Date   ABDOMINAL AORTOGRAM W/LOWER EXTREMITY Bilateral 03/05/2020   Procedure: ABDOMINAL AORTOGRAM W/LOWER EXTREMITY;  Surgeon: Marty Heck, MD;  Location: [place] [facility];  Service: Cardiovascular;  Laterality: Bilateral;   ENDARTERECTOMY  FEMORAL Left 03/24/2020   Procedure: LEFT COMMON FEMORAL ENDARTERECTOMY;  Surgeon: Marty Heck, MD;  Location: [place];  Service: Vascular;  Laterality: Left;   ENDOVEIN HARVEST OF GREATER SAPHENOUS VEIN Left 03/24/2020   Procedure: HARVEST OF GREATER SAPHENOUS VEIN;  Surgeon: Marty Heck, MD;  Location: [place];  Service: Vascular;  Laterality: Left;   FEMORAL-POPLITEAL BYPASS GRAFT Left 03/24/2020   Procedure: BYPASS GRAFT FEMORAL-POPLITEAL ARTERY;  Surgeon: Marty Heck, MD;  Location: [place];  Service: Vascular;  Laterality: Left;   INSERTION OF ILIAC STENT Left 03/24/2020   Procedure: INSERTION OF RETROGRADE ILIAC STENT;  Surgeon: Marty Heck, MD;  Location: [place];   Service: Vascular;  Laterality: Left;   INTRAMEDULLARY (IM) NAIL INTERTROCHANTERIC Left 03/18/2018   Procedure: INTRAMEDULLARY (IM) NAIL INTERTROCHANTRIC;  Surgeon: Shona Needles, MD;  Location: [place];  Service: Orthopedics;  Laterality: Left;   INTRAOPERATIVE ARTERIOGRAM Left 03/24/2020   Procedure: INTRA OPERATIVE ARTERIOGRAM;  Surgeon: Marty Heck, MD;  [facility];  Service: Vascular;  Laterality: Left;     Family History  Problem Relation Age of Onset   Sudden Cardiac Death Neg Hx      Social History   Socioeconomic History   Marital status: Legally Separated    Spouse name: Not on file   Number of children: Not on file   Years of education: Not on file   Highest education level: Not on file  Occupational History   Not on file  Tobacco Use   Smoking status: Every Day    Packs/day: 0.50    Types: Cigarettes   Smokeless tobacco: Never  Vaping Use   Vaping Use: Never used  Substance and Sexual Activity   Alcohol use: Yes    Comment: two 40 oz beers most days    Drug use: Not Currently    Types: Marijuana   Sexual activity: Not on file  Other Topics Concern   Not on file  Social History Narrative   Not on file   Social Determinants of Health   Financial Resource Strain: Not on file  Food Insecurity: Not on file  Transportation Needs: Not on file  Physical Activity: Not on file  Stress: Not on file  Social Connections: Not on file  Intimate Partner Violence: Not on file     BP (!) 146/80   Pulse 61   Ht '5\' 9"'$  (1.753 m)   Wt 107 lb (48.5 kg)   SpO2 98%   BMI 15.80 kg/m   Physical Exam:  Chronically ill appearing NAD HEENT: Unremarkable except for right facial depression  Neck:  6 cm JVD, no thyromegally Lymphatics:  No adenopathy Back:  No CVA tenderness Lungs:  Clear with no wheezes HEART:  Regular rate rhythm, no murmurs, no rubs, no clicks Abd:  soft, positive bowel sounds, no organomegally, no rebound, no guarding Ext:  2 plus  pulses, no edema, no cyanosis, no clubbing Skin:  No rashes no nodules Neuro:  CN II through XII intact, motor grossly intact  EKG - nsr   Assess/Plan:  Atypical atrial flutter - I cannot find evidence of any recurrence. I asked her to reduce her dose of amio to 200 mg daily Mon-Fri and none on Sat/Sun. Coags - she will continue eliquis. No bleeding. Tobacco abuse - I have encouraged her to stop smoking.  PVD - she has warm feet and her bp is ok. I asked her to walk and stop smoking.  Carleene Overlie Imelda Dandridge,MD

## 2022-05-26 ENCOUNTER — Other Ambulatory Visit: Payer: Self-pay

## 2022-05-26 DIAGNOSIS — I10 Essential (primary) hypertension: Secondary | ICD-10-CM

## 2022-05-26 MED ORDER — METOPROLOL SUCCINATE ER 25 MG PO TB24: 25.0000 mg | ORAL_TABLET | Freq: Every day | ORAL | 3 refills | Status: DC

## 2022-05-31 ENCOUNTER — Other Ambulatory Visit: Payer: Self-pay | Admitting: Cardiology

## 2022-05-31 NOTE — Telephone Encounter (Signed)
Prescription refill request for Eliquis received. Indication: A Flutter Last office visit: 03/23/22  Levada Dy MD Scr: 1.17 on 03/15/22 Age:  66 Weight: 47.8kg  Based on above findings Eliquis '5mg'$  twice daily is the appropriate dose.  Refill approved.

## 2022-06-08 ENCOUNTER — Encounter: Payer: Self-pay | Admitting: Podiatry

## 2022-06-08 ENCOUNTER — Ambulatory Visit (INDEPENDENT_AMBULATORY_CARE_PROVIDER_SITE_OTHER): Payer: 59 | Admitting: Podiatry

## 2022-06-08 DIAGNOSIS — B351 Tinea unguium: Secondary | ICD-10-CM

## 2022-06-08 DIAGNOSIS — Z7901 Long term (current) use of anticoagulants: Secondary | ICD-10-CM

## 2022-06-08 DIAGNOSIS — M79675 Pain in left toe(s): Secondary | ICD-10-CM | POA: Diagnosis not present

## 2022-06-08 DIAGNOSIS — I739 Peripheral vascular disease, unspecified: Secondary | ICD-10-CM

## 2022-06-08 DIAGNOSIS — M79674 Pain in right toe(s): Secondary | ICD-10-CM

## 2022-06-08 NOTE — Progress Notes (Signed)
This patient returns to my office for at risk foot care.  This patient requires this care by a professional since this patient will be at risk due to having PAD and coagulation defect due to eliquis. This patient is unable to cut nails herself since the patient cannot reach her nails.These nails are painful walking and wearing shoes.  This patient presents for at risk foot care today.  General Appearance  Alert, conversant and in no acute stress.  Vascular  Dorsalis pedis and posterior tibial  pulses are palpable  bilaterally.  Capillary return is within normal limits  bilaterally. Temperature is within normal limits  bilaterally.  Neurologic  Senn-Weinstein monofilament wire test within normal limits  bilaterally. Muscle power within normal limits bilaterally.  Nails Thick disfigured discolored nails with subungual debris  from hallux to fifth toes left and second to fifth toes right foot.. No evidence of bacterial infection or drainage bilaterally.  Orthopedic  No limitations of motion  feet .  No crepitus or effusions noted.  No bony pathology or digital deformities noted.  Skin  normotropic skin with no porokeratosis noted bilaterally.  No signs of infections or ulcers noted.     Onychomycosis  Pain in right toes  Pain in left toes  Consent was obtained for treatment procedures.   Mechanical debridement of nails 1-5 left and 2-5 right foot.performed with a nail nipper.  Filed with dremel without incident.    Return office visit   3 months                   Told patient to return for periodic foot care and evaluation due to potential at risk complications.   Gardiner Barefoot DPM

## 2022-06-10 ENCOUNTER — Other Ambulatory Visit (INDEPENDENT_AMBULATORY_CARE_PROVIDER_SITE_OTHER): Payer: Self-pay | Admitting: Primary Care

## 2022-06-10 DIAGNOSIS — J301 Allergic rhinitis due to pollen: Secondary | ICD-10-CM

## 2022-06-10 NOTE — Telephone Encounter (Signed)
Requested Prescriptions  Pending Prescriptions Disp Refills  . fluticasone (FLONASE) 50 MCG/ACT nasal spray [Pharmacy Med Name: FLUTICASONE 50MCG NASAL SP (120) RX] 48 g 0    Sig: SHAKE LIQUID AND USE 2 SPRAYS IN EACH NOSTRIL DAILY     Ear, Nose, and Throat: Nasal Preparations - Corticosteroids Passed - 06/10/2022  8:36 AM      Passed - Valid encounter within last 12 months    Recent Outpatient Visits          6 months ago Essential hypertension   [place] Kerin Perna, NP   8 months ago Hospital discharge follow-up   [place], [place], NP   1 year ago Hospital discharge follow-up   [place], [place], NP   2 years ago Hospital discharge follow-up   [place], [place], NP   2 years ago Benign hypertension   [place] Kerin Perna, NP      Future Appointments            In 1 week Monge, Helane Gunther, NP [place] [facility] [place] [place]

## 2022-06-23 ENCOUNTER — Ambulatory Visit: Payer: 59 | Attending: Nurse Practitioner | Admitting: Nurse Practitioner

## 2022-06-23 NOTE — Progress Notes (Deleted)
Office Visit    Patient Name: Veronica Stewart Date of Encounter: 06/23/2022  Primary Care Provider:  Kerin Perna, NP Primary Cardiologist:  Donato Heinz, MD  Chief Complaint    66 year old female with a history of atrial flutter (on Eliquis), chronic combined systolic and diastolic heart failure, PAD s/p left external iliac stent and fem-pop bypass (follows with vascular surgery), hypertension, hyperlipidemia, anemia, and tobacco use who presents for follow-up related to atrial flutter.  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)    Past Surgical History:  Procedure Laterality Date   ABDOMINAL AORTOGRAM W/LOWER EXTREMITY Bilateral 03/05/2020   Procedure: ABDOMINAL AORTOGRAM W/LOWER EXTREMITY;  Surgeon: Marty Heck, MD;  Location: [place] [facility];  Service: Cardiovascular;  Laterality: Bilateral;   ENDARTERECTOMY FEMORAL Left 03/24/2020   Procedure: LEFT COMMON FEMORAL ENDARTERECTOMY;  Surgeon: Marty Heck, MD;  Location: [place];  Service: Vascular;  Laterality: Left;   ENDOVEIN HARVEST OF GREATER SAPHENOUS VEIN Left 03/24/2020   Procedure: HARVEST OF GREATER SAPHENOUS VEIN;  Surgeon: Marty Heck, MD;  Location: [place];  Service: Vascular;  Laterality: Left;   FEMORAL-POPLITEAL BYPASS GRAFT Left 03/24/2020   Procedure: BYPASS GRAFT FEMORAL-POPLITEAL ARTERY;  Surgeon: Marty Heck, MD;  Location: [place];  Service: Vascular;  Laterality: Left;   INSERTION OF ILIAC STENT Left 03/24/2020   Procedure: INSERTION OF RETROGRADE ILIAC STENT;  Surgeon: Marty Heck, MD;  Location: [place];  Service: Vascular;  Laterality: Left;   INTRAMEDULLARY (IM) NAIL INTERTROCHANTERIC Left 03/18/2018   Procedure: INTRAMEDULLARY (IM) NAIL INTERTROCHANTRIC;  Surgeon: Shona Needles,  MD;  Location: [place];  Service: Orthopedics;  Laterality: Left;   INTRAOPERATIVE ARTERIOGRAM Left 03/24/2020   Procedure: INTRA OPERATIVE ARTERIOGRAM;  Surgeon: Marty Heck, MD;  Location: [place];  Service: Vascular;  Laterality: Left;    Allergies  No Known Allergies  History of Present Illness    66 year old female with the above past medical history including atrial flutter on Eliquis, chronic combined systolic and diastolic heart failure, PAD s/p left external iliac stent and fem-pop bypass (follows with vascular surgery), hypertension, hyperlipidemia, anemia, and tobacco use.  She was diagnosed with atrial flutter during a hospitalization in 01/2021.  She was started on amiodarone and aspirin and Plavix were transitioned to Eliquis monotherapy, follows with EP.  Echocardiogram in 01/2021 showed EF 40 to 45%. 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  She completed adjuvant chemotherapy and radiation.  Most recent echocardiogram in 02/2022 showed EF 55 to 65%, normal diastolic function, normal RV function, RVSP 41 mmHg, mild MR, mild to moderate TR.  Zio patch in 02/2022 showed no significant arrhythmias.   She was last seen in the office on 03/23/2022 and was stable from a cardiac standpoint.  He did note occasional lightheadedness when taking hydralazine 25 mg 3 times daily.  Her dose was decreased to twice daily.  She saw Dr. Lovena Le 05/06/2022.  Given no evidence of recurrence of atrial flutter, her amiodarone was reduced to 200 mg daily Monday through Friday.   She presents today for follow-up. Since her last  visit  Atrial flutter: Chronic combined systolic and diastolic heart failure with improved EF: PAD: Hypertension: Hyperlipidemia: H/o anemia: Tobacco use: Disposition:  Home Medications    Current Outpatient  Medications  Medication Sig Dispense Refill   acetaminophen (TYLENOL) 500 MG tablet Take 500 mg by mouth every 6 (six) hours as needed for mild pain.     albuterol (VENTOLIN HFA) 108 (90 Base) MCG/ACT inhaler INHALE 2 PUFFS BY MOUTH EVERY 6 HOURS AS NEEDED FOR WHEEZING AND SHORTNESS OF BREATH 8.5 g 2   amiodarone (PACERONE) 200 MG tablet Take one tablet ( 200 Mg ) by mouth daily Monday through Friday. Do NOT take any Amiodarone on Saturday or Sunday. 90 tablet 3   amLODipine (NORVASC) 5 MG tablet TAKE 1 TABLET(5 MG) BY MOUTH DAILY 90 tablet 3   apixaban (ELIQUIS) 5 MG TABS tablet TAKE 1 TABLET BY MOUTH TWICE  DAILY 180 tablet 1   atorvastatin (LIPITOR) 20 MG tablet Take 1 tablet (20 mg total) by mouth daily. 90 tablet 3   DEEP SEA NASAL SPRAY 0.65 % nasal spray SMARTSIG:Both Nares     feeding supplement (ENSURE ENLIVE / ENSURE PLUS) LIQD Take 237 mLs by mouth 3 (three) times daily between meals. 237 mL 12   fluticasone (FLONASE) 50 MCG/ACT nasal spray SHAKE LIQUID AND USE 2 SPRAYS IN EACH NOSTRIL DAILY 48 g 0   Fluticasone-Salmeterol (ADVAIR) 100-50 MCG/DOSE AEPB Inhale 1 puff into the lungs 2 (two) times daily. 1 each 3   hydrALAZINE (APRESOLINE) 25 MG tablet Take 1 tablet (25 mg total) by mouth 2 (two) times daily. (Patient taking differently: Take 25 mg by mouth 3 (three) times daily.) 180 tablet 3   loratadine (CLARITIN) 10 MG tablet Take 1 tablet (10 mg total) by mouth daily. 30 tablet 11   metoprolol succinate (TOPROL-XL) 25 MG 24 hr tablet Take 1 tablet (25 mg total) by mouth daily. Take with or immediately following a meal. 90 tablet 3   ondansetron (ZOFRAN) 8 MG tablet Take 8 mg by mouth 3 (three) times daily.     pantoprazole (PROTONIX) 40 MG tablet Take 1 tablet (40 mg total) by mouth 2 (two) times daily. 180 tablet 0   sodium chloride 1 g tablet Take 1 g by mouth 2 (two) times daily.     Current Facility-Administered Medications  Medication Dose Route Frequency Provider Last Rate Last  Admin   albuterol (PROVENTIL) (2.5 MG/3ML) 0.083% nebulizer solution 2.5 mg  2.5 mg Nebulization Once Kerin Perna, NP         Review of Systems    ***.  All other systems reviewed and are otherwise negative except as noted above.    Physical Exam    VS:  There were no vitals taken for this visit. , BMI There is no height or weight on file to calculate BMI.     GEN: Well nourished, well developed, in no acute distress. HEENT: normal. Neck: Supple, no JVD, carotid bruits, or masses. Cardiac: RRR, no murmurs, rubs, or gallops. No clubbing, cyanosis, edema.  Radials/DP/PT 2+ and equal bilaterally.  Respiratory:  Respirations regular and unlabored, clear to auscultation bilaterally. GI: Soft, nontender, nondistended, BS + x 4. MS: no deformity or atrophy. Skin: warm and dry, no rash. Neuro:  Strength and sensation are intact. Psych: Normal affect.  Accessory Clinical Findings    ECG personally reviewed by me today - *** - no acute changes.   Lab Results  Component Value Date   WBC  6.5 02/09/2022   HGB 12.0 02/09/2022   HCT 37.9 02/09/2022   MCV 79 02/09/2022   PLT 244 02/09/2022   Lab Results  Component Value Date   CREATININE 1.17 (H) 03/15/2022   BUN 16 03/15/2022   NA 141 03/15/2022   K 5.1 03/15/2022   CL 102 03/15/2022   CO2 27 03/15/2022   Lab Results  Component Value Date   ALT 11 02/09/2022   AST 16 02/09/2022   ALKPHOS 77 02/09/2022   BILITOT 0.3 02/09/2022   Lab Results  Component Value Date   CHOL 89 03/25/2020   HDL 44 03/25/2020   LDLCALC 39 03/25/2020   TRIG 31 03/25/2020   CHOLHDL 2.0 03/25/2020    Lab Results  Component Value Date   HGBA1C 5.2 03/26/2019    Assessment & Plan    1.  ***  No BP recorded.  {Refresh Note OR Click here to enter BP  :1}***   Lenna Sciara, NP 06/23/2022, 5:24 AM

## 2022-06-28 ENCOUNTER — Encounter: Payer: Self-pay | Admitting: Nurse Practitioner

## 2022-07-02 ENCOUNTER — Other Ambulatory Visit (INDEPENDENT_AMBULATORY_CARE_PROVIDER_SITE_OTHER): Payer: Self-pay | Admitting: Primary Care

## 2022-07-02 DIAGNOSIS — I10 Essential (primary) hypertension: Secondary | ICD-10-CM

## 2022-07-05 ENCOUNTER — Other Ambulatory Visit (INDEPENDENT_AMBULATORY_CARE_PROVIDER_SITE_OTHER): Payer: Self-pay | Admitting: Primary Care

## 2022-07-05 DIAGNOSIS — I10 Essential (primary) hypertension: Secondary | ICD-10-CM

## 2022-07-06 NOTE — Telephone Encounter (Signed)
Requested Prescriptions  Pending Prescriptions Disp Refills   metoprolol succinate (TOPROL-XL) 25 MG 24 hr tablet [Pharmacy Med Name: METOPROLOL ER SUCCINATE '25MG'$  TABS] 90 tablet 3    Sig: TAKE 1 TABLET(25 MG) BY MOUTH DAILY WITH OR IMMEDIATELY FOLLOWING A MEAL     Cardiovascular:  Beta Blockers Failed - 07/05/2022 12:41 PM      Failed - Valid encounter within last 6 months    Recent Outpatient Visits           7 months ago Essential hypertension   [place], Michelle P, NP   9 months ago Hospital discharge follow-up   [place], [place], NP   1 year ago Hospital discharge follow-up   [place], [place], NP   2 years ago Hospital discharge follow-up   [place], [place], NP   2 years ago Benign hypertension   [place] Kerin Perna, NP       Future Appointments             In 1 month [place] [facility] [place] [facility] [place]            Passed - Last BP in normal range    BP Readings from Last 1 Encounters:  05/06/22 132/80         Passed - Last Heart Rate in normal range    Pulse Readings from Last 1 Encounters:  05/06/22 61

## 2022-08-23 NOTE — Progress Notes (Unsigned)
Cardiology Office Note:    Date:  08/23/2022   ID:  Veronica Stewart, DOB Aug 16, 1956, MRN [id number]  PCP:  Kerin Perna, NP [place] Cardiologist: Donato Heinz, MD   Reason for visit: 3 month follow-up  History of Present Illness:    Veronica Stewart is a 67 y.o. female with a hx of atrial flutter, PAD - external iliac stenting in August 2021, hypertension, asthma, tobacco use, moderate CAD by CT FFR 2022.  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  She completed adjuvant chemotherapy and radiation.   Echo 01/2020 with EF 40-45%.  Echo 02/2022 with EF 55-60%, mild MR, mild to moderate TR. Zio patch was worn for 9 days 02/2022 the only had 1 day of analysis time, no significant arrhythmias were seen.   Hypertension, she is started lisinopril HCTZ 2025 mg daily but developed AKI and it was discontinued.  She was then treated with amlodipine 10 mg daily, hydralazine 25 milligrams 3 times a day and Toprol XL 25 mg daily.  With lightheadedness with hydralazine, dose was decreased to twice daily.  She was last seen by Dr. Gardiner Rhyme in August 2023.  She was doing well but did report having dizziness when taking hydralazine 25 mg 3 times a day and therefore decreased dose to twice daily.  Patient Veronica Stewart in September 2023 for atypical atrial flutter.  He could not find evidence of any recurrence.  Her amiodarone was decreased to 200 mg daily Monday through Friday and none on Saturday and Sunday.  Encouraged to stop smoking.  Hx of atrial flutter -Noted during admission in June 2022 in setting of epitaxis (found with right nasal squamous cell carcinoma), EF at that time 40 to 45% -Treated with amiodarone.  Normal TSH, LFTs 02/09/2022. -Continue Eliquis for stroke prevention.  No bleeding issues.  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 -***  Coronary artery disease -Coronary CTA 04/14/2021 showed calcium score 234 (93rd percentile), moderate nonobstructive CAD by CT FFR  -***  PAD  -s/p fem-pop bypass, left external iliac stenting 8/21  -*** -On Eliquis monotherapy  Hypertension -Lisinopril HCTZ discontinued with AKI creatinine peaking at 1.76. -*** -Goal BP is <130/80.  Recommend DASH diet (high in vegetables, fruits, low-fat dairy products, whole grains, poultry, fish, and nuts and low in sweets, sugar-sweetened beverages, and red meats), salt restriction and increase physical activity.  Hyperlipidemia -*** -Discussed cholesterol lowering diets - Mediterranean diet, DASH diet, vegetarian diet, low-carbohydrate diet and avoidance of trans fats.  Discussed healthier choice substitutes.  Nuts, high-fiber foods, and fiber supplements may also improve lipids.    Obesity -Discussed how even a 5-10% weight loss can have cardiovascular benefits.   -Recommend moderate intensity activity for 30 minutes 5 days/week and the DASH diet.  Tobacco use  -Recommend tobacco cessation.  Reviewed physiologic effects of nicotine and the immediate-eventual benefits of quitting including improvement in cough/breathing and reduction in cardiovascular events.  Discussed quitting tips such as removing triggers and getting support from family/friends and Quitline [place]. -USPSTF recommends one-time screening for abdominal aortic aneurysm (AAA) by ultrasound in men 60 -32 years old who have ever smoked.      Disposition - Follow-up in ***     Past Medical History:  Diagnosis Date   Asthma    Atrial fibrillation (HCC)    Dermatophytosis of nail  Hypertension    Oropharyngeal dysphagia    Squamous cell carcinoma of nasal cavity [facility])     Past Surgical History:  Procedure Laterality Date   ABDOMINAL AORTOGRAM W/LOWER EXTREMITY Bilateral 03/05/2020   Procedure:  ABDOMINAL AORTOGRAM W/LOWER EXTREMITY;  Surgeon: Marty Heck, MD;  Location: [place] [facility];  Service: Cardiovascular;  Laterality: Bilateral;   ENDARTERECTOMY FEMORAL Left 03/24/2020   Procedure: LEFT COMMON FEMORAL ENDARTERECTOMY;  Surgeon: Marty Heck, MD;  Location: [place];  Service: Vascular;  Laterality: Left;   ENDOVEIN HARVEST OF GREATER SAPHENOUS VEIN Left 03/24/2020   Procedure: HARVEST OF GREATER SAPHENOUS VEIN;  Surgeon: Marty Heck, MD;  Location: [place];  Service: Vascular;  Laterality: Left;   FEMORAL-POPLITEAL BYPASS GRAFT Left 03/24/2020   Procedure: BYPASS GRAFT FEMORAL-POPLITEAL ARTERY;  Surgeon: Marty Heck, MD;  Location: [place];  Service: Vascular;  Laterality: Left;   INSERTION OF ILIAC STENT Left 03/24/2020   Procedure: INSERTION OF RETROGRADE ILIAC STENT;  Surgeon: Marty Heck, MD;  Location: [place];  Service: Vascular;  Laterality: Left;   INTRAMEDULLARY (IM) NAIL INTERTROCHANTERIC Left 03/18/2018   Procedure: INTRAMEDULLARY (IM) NAIL INTERTROCHANTRIC;  Surgeon: Shona Needles, MD;  Location: [place];  Service: Orthopedics;  Laterality: Left;   INTRAOPERATIVE ARTERIOGRAM Left 03/24/2020   Procedure: INTRA OPERATIVE ARTERIOGRAM;  Surgeon: Marty Heck, MD;  Location: [place];  Service: Vascular;  Laterality: Left;    Current Medications: No outpatient medications have been marked as taking for the 08/25/22 encounter (Appointment) with Warren Lacy, PA-C.   Current Facility-Administered Medications for the 08/25/22 encounter (Appointment) with Warren Lacy, PA-C  Medication   albuterol (PROVENTIL) (2.5 MG/3ML) 0.083% nebulizer solution 2.5 mg     Allergies:   Patient has no known allergies.   Social History   Socioeconomic History   Marital status: Legally Separated    Spouse name: Not on file   Number of children: Not on file   Years of education: Not on file   Highest education level: Not on file   Occupational History   Not on file  Tobacco Use   Smoking status: Every Day    Packs/day: 0.50    Types: Cigarettes   Smokeless tobacco: Never  Vaping Use   Vaping Use: Never used  Substance and Sexual Activity   Alcohol use: Yes    Comment: two 40 oz beers most days    Drug use: Not Currently    Types: Marijuana   Sexual activity: Not on file  Other Topics Concern   Not on file  Social History Narrative   Not on file   Social Determinants of Health   Financial Resource Strain: Not on file  Food Insecurity: Not on file  Transportation Needs: Not on file  Physical Activity: Not on file  Stress: Not on file  Social Connections: Not on file     Family History: [name] is negative for Sudden Cardiac Death.  ROS:   Please see the history of present illness.     EKGs/Labs/Other Studies Reviewed:    EKG:  The ekg ordered today demonstrates ***  Recent Labs: 02/09/2022: ALT 11; Hemoglobin 12.0; Platelets 244; TSH 1.950 03/15/2022: BUN 16; Creatinine, Ser 1.17; Potassium 5.1; Sodium 141   Recent Lipid Panel Lab Results  Component Value Date/Time   CHOL 89 03/25/2020 02:16 AM   CHOL 173 03/26/2019 02:50 PM   TRIG 31 03/25/2020 02:16 AM   HDL 44 03/25/2020  02:16 AM   HDL 102 03/26/2019 02:50 PM   LDLCALC 39 03/25/2020 02:16 AM   LDLCALC 48 03/26/2019 02:50 PM    Physical Exam:    VS:  There were no vitals taken for this visit.   No data found.  No BP recorded.  {Refresh Note OR Click here to enter BP  :1}***    Wt Readings from Last 3 Encounters:  05/06/22 107 lb (48.5 kg)  03/23/22 105 lb 6.4 oz (47.8 kg)  02/24/22 104 lb (47.2 kg)     GEN: *** Well nourished, well developed in no acute distress HEENT: Normal NECK: No JVD; No carotid bruits CARDIAC: ***RRR, no murmurs, rubs, gallops RESPIRATORY:  Clear to auscultation without rales, wheezing or rhonchi  ABDOMEN: Soft, non-tender, non-distended MUSCULOSKELETAL: No edema SKIN: Warm  and dry NEUROLOGIC:  Alert and oriented PSYCHIATRIC:  Normal affect     ASSESSMENT AND PLAN   ***   {Are you ordering a CV Procedure (e.g. stress test, cath, DCCV, TEE, etc)?   Press F2        :[id number]}    Medication Adjustments/Labs and Tests Ordered: Current medicines are reviewed at length with the patient today.  Concerns regarding medicines are outlined above.  No orders of the defined types were placed in this encounter.  No orders of the defined types were placed in this encounter.   There are no Patient Instructions on file for this visit.   Signed, Warren Lacy, PA-C  08/23/2022 3:55 PM    [facility]

## 2022-08-25 ENCOUNTER — Ambulatory Visit: Payer: 59 | Attending: Physician Assistant | Admitting: Physician Assistant

## 2022-08-25 VITALS — BP 132/84 | HR 71 | Ht 69.0 in | Wt 108.0 lb

## 2022-08-25 DIAGNOSIS — I4892 Unspecified atrial flutter: Secondary | ICD-10-CM | POA: Diagnosis not present

## 2022-08-25 DIAGNOSIS — I10 Essential (primary) hypertension: Secondary | ICD-10-CM | POA: Diagnosis not present

## 2022-08-25 DIAGNOSIS — I2510 Atherosclerotic heart disease of native coronary artery without angina pectoris: Secondary | ICD-10-CM

## 2022-08-25 DIAGNOSIS — I739 Peripheral vascular disease, unspecified: Secondary | ICD-10-CM

## 2022-08-25 LAB — COMPREHENSIVE METABOLIC PANEL
ALT: 17 IU/L (ref 0–32)
AST: 24 IU/L (ref 0–40)
Albumin/Globulin Ratio: 1.1 — ABNORMAL LOW (ref 1.2–2.2)
Albumin: 4.2 g/dL (ref 3.9–4.9)
Alkaline Phosphatase: 88 IU/L (ref 44–121)
BUN/Creatinine Ratio: 11 — ABNORMAL LOW (ref 12–28)
BUN: 14 mg/dL (ref 8–27)
Bilirubin Total: 0.4 mg/dL (ref 0.0–1.2)
CO2: 27 mmol/L (ref 20–29)
Calcium: 9.6 mg/dL (ref 8.7–10.3)
Chloride: 101 mmol/L (ref 96–106)
Creatinine, Ser: 1.3 mg/dL — ABNORMAL HIGH (ref 0.57–1.00)
Globulin, Total: 3.8 g/dL (ref 1.5–4.5)
Glucose: 78 mg/dL (ref 70–99)
Potassium: 5.1 mmol/L (ref 3.5–5.2)
Sodium: 137 mmol/L (ref 134–144)
Total Protein: 8 g/dL (ref 6.0–8.5)
eGFR: 46 mL/min/{1.73_m2} — ABNORMAL LOW (ref >59)

## 2022-08-25 LAB — TSH: TSH: 2.17 u[IU]/mL (ref 0.450–4.500)

## 2022-08-25 NOTE — Patient Instructions (Signed)
Medication Instructions:  No Changes *If you need a refill on your cardiac medications before your next appointment, please call your pharmacy*   Lab Work: CMET, TSH. Today If you have labs (blood work) drawn today and your tests are completely normal, you will receive your results only by: [place] (if you have MyChart) OR A paper copy in the mail If you have any lab test that is abnormal or we need to change your treatment, we will call you to review the results.   Testing/Procedures: No Testing    Follow-Up: At [facility], you and your health needs are our priority.  As part of our continuing mission to provide you with exceptional heart care, we have created designated Provider Care Teams.  These Care Teams include your primary Cardiologist (physician) and Advanced Practice Providers (APPs -  Physician Assistants and Nurse Practitioners) who all work together to provide you with the care you need, when you need it.  We recommend signing up for the patient portal called "MyChart".  Sign up information is provided on this After Visit Summary.  MyChart is used to connect with patients for Virtual Visits (Telemedicine).  Patients are able to view lab/test results, encounter notes, upcoming appointments, etc.  Non-urgent messages can be sent to your provider as well.   To learn more about what you can do with MyChart, go to [email or web address].    Your next appointment:   6 month(s)  The format for your next appointment:   In Person  Provider:   Donato Heinz, MD

## 2022-08-27 ENCOUNTER — Other Ambulatory Visit (INDEPENDENT_AMBULATORY_CARE_PROVIDER_SITE_OTHER): Payer: Self-pay | Admitting: Primary Care

## 2022-08-27 DIAGNOSIS — J301 Allergic rhinitis due to pollen: Secondary | ICD-10-CM

## 2022-08-27 MED ORDER — PANTOPRAZOLE SODIUM 40 MG PO TBEC: 40.0000 mg | DELAYED_RELEASE_TABLET | Freq: Two times a day (BID) | ORAL | 0 refills | Status: DC

## 2022-08-27 NOTE — Telephone Encounter (Signed)
Requested Prescriptions  Pending Prescriptions Disp Refills   pantoprazole (PROTONIX) 40 MG tablet 180 tablet 0    Sig: Take 1 tablet (40 mg total) by mouth 2 (two) times daily.     Gastroenterology: Proton Pump Inhibitors Passed - 08/27/2022 12:02 PM      Passed - Valid encounter within last 12 months    Recent Outpatient Visits           8 months ago Essential hypertension   [place], Michelle P, NP   11 months ago Hospital discharge follow-up   [place], [place]ell, NP   1 year ago Hospital discharge follow-up   [place], [place], NP   2 years ago Hospital discharge follow-up   [place] Kerin Perna, NP   2 years ago Benign hypertension   [place], Michelle P, NP               loratadine (CLARITIN) 10 MG tablet 30 tablet 11    Sig: Take 1 tablet (10 mg total) by mouth daily.     Ear, Nose, and Throat:  Antihistamines 2 Failed - 08/27/2022 12:02 PM      Failed - Cr in normal range and within 360 days    Creatinine, Ser  Date Value Ref Range Status  08/25/2022 1.30 (H) 0.57 - 1.00 mg/dL Final         Passed - Valid encounter within last 12 months    Recent Outpatient Visits           8 months ago Essential hypertension   [place], [place], NP   11 months ago Hospital discharge follow-up   [place], [place], NP   1 year ago Hospital discharge follow-up   [place], [place], NP   2 years ago Hospital discharge follow-up   [place], Leith, NP   2 years ago Benign hypertension   [place] Kerin Perna, NP

## 2022-08-27 NOTE — Telephone Encounter (Signed)
Unable to refill per protocol, Rx request is too soon. Last refill 01/06/22 for 30 and 11 refills. Will refuse.  Requested Prescriptions  Pending Prescriptions Disp Refills   loratadine (CLARITIN) 10 MG tablet 30 tablet 11    Sig: Take 1 tablet (10 mg total) by mouth daily.     Ear, Nose, and Throat:  Antihistamines 2 Failed - 08/27/2022 12:02 PM      Failed - Cr in normal range and within 360 days    Creatinine, Ser  Date Value Ref Range Status  08/25/2022 1.30 (H) 0.57 - 1.00 mg/dL Final         Passed - Valid encounter within last 12 months    Recent Outpatient Visits           8 months ago Essential hypertension   [place], [place], NP   11 months ago Hospital discharge follow-up   [place], [place], NP   1 year ago Hospital discharge follow-up   [place], [place], NP   2 years ago Hospital discharge follow-up   [place] Kerin Perna, NP   2 years ago Benign hypertension   [place] Kerin Perna, NP              Signed Prescriptions Disp Refills   pantoprazole (PROTONIX) 40 MG tablet 180 tablet 0    Sig: Take 1 tablet (40 mg total) by mouth 2 (two) times daily.     Gastroenterology: Proton Pump Inhibitors Passed - 08/27/2022 12:02 PM      Passed - Valid encounter within last 12 months    Recent Outpatient Visits           8 months ago Essential hypertension   [place], [place], NP   11 months ago Hospital discharge follow-up   [place], [place], NP   1 year ago Hospital discharge follow-up   [place], [place], NP   2 years ago Hospital discharge follow-up   [place], [place], NP   2 years ago Benign hypertension   [place] Kerin Perna, NP

## 2022-08-27 NOTE — Telephone Encounter (Signed)
Medication Refill - Medication: pantoprazole (PROTONIX) 40 MG tablet [  loratadine (CLARITIN) 10 MG tablet  Has the patient contacted their pharmacy? Yes.   (Agent: If no, request that the patient contact the pharmacy for the refill. If patient does not wish to contact the pharmacy document the reason why and proceed with request.) (Agent: If yes, when and what did the pharmacy advise?)  Preferred Pharmacy (with phone number or street name):   [place], [place]  [address] [place] [address]  Phone: [phone] Fax: [phone]   Has the patient been seen for an appointment in the last year OR does the patient have an upcoming appointment? Yes.    Agent: Please be advised that RX refills may take up to 3 business days. We ask that you follow-up with your pharmacy.

## 2022-09-03 ENCOUNTER — Telehealth: Payer: Self-pay

## 2022-09-03 NOTE — Telephone Encounter (Addendum)
Called patient regarding results. Patient had understanding of results.----- Message from Warren Lacy, PA-C sent at 08/31/2022  1:36 PM EST ----- Kidney function stable.  Electrolytes normal though potassium tends to run on higher end of normal.  Avoid potassium supplements & high potassium foods.   Liver function normal. Thyroid function normal. Safe to continue amiodarone.

## 2022-09-08 ENCOUNTER — Ambulatory Visit (INDEPENDENT_AMBULATORY_CARE_PROVIDER_SITE_OTHER): Payer: 59 | Admitting: Podiatry

## 2022-09-08 ENCOUNTER — Encounter: Payer: Self-pay | Admitting: Podiatry

## 2022-09-08 DIAGNOSIS — B351 Tinea unguium: Secondary | ICD-10-CM

## 2022-09-08 DIAGNOSIS — M79675 Pain in left toe(s): Secondary | ICD-10-CM | POA: Diagnosis not present

## 2022-09-08 DIAGNOSIS — Q828 Other specified congenital malformations of skin: Secondary | ICD-10-CM

## 2022-09-08 DIAGNOSIS — M79674 Pain in right toe(s): Secondary | ICD-10-CM | POA: Diagnosis not present

## 2022-09-08 DIAGNOSIS — I739 Peripheral vascular disease, unspecified: Secondary | ICD-10-CM

## 2022-09-08 NOTE — Progress Notes (Signed)
This patient returns to my office for at risk foot care.  This patient requires this care by a professional since this patient will be at risk due to having PAD and coagulation defect due to eliquis.  This patient is unable to cut nails herself since the patient cannot reach her nails.These nails are painful walking and wearing shoes.  This patient presents for at risk foot care today.  General Appearance  Alert, conversant and in no acute stress.  Vascular  Dorsalis pedis and posterior tibial  pulses are palpable  bilaterally.  Capillary return is within normal limits  bilaterally. Temperature is within normal limits  bilaterally.  Neurologic  Senn-Weinstein monofilament wire test within normal limits  bilaterally. Muscle power within normal limits bilaterally.  Nails Thick disfigured discolored nails with subungual debris  from hallux to fifth toes left and second to fifth toes right foot.. No evidence of bacterial infection or drainage bilaterally.  Orthopedic  No limitations of motion  feet .  No crepitus or effusions noted.  No bony pathology or digital deformities noted.  Skin  normotropic skin noted bilaterally.  No signs of infections or ulcers noted.   Porokeratosis sub 5th metabase  B/L.  Onychomycosis  Pain in right toes  Pain in left toes   Porokeratosis  B/l.  Consent was obtained for treatment procedures.   Mechanical debridement of nails 1-5 left and 2-5 right foot.performed with a nail nipper.  Filed with dremel without incident.  Debride porokeratosis sub 5th metabase  B/L with # 15 blade and dremel tool.   Return office visit   3 months                   Told patient to return for periodic foot care and evaluation due to potential at risk complications.   Gardiner Barefoot DPM

## 2022-09-09 ENCOUNTER — Other Ambulatory Visit: Payer: Self-pay | Admitting: Cardiology

## 2022-09-09 ENCOUNTER — Other Ambulatory Visit (INDEPENDENT_AMBULATORY_CARE_PROVIDER_SITE_OTHER): Payer: Self-pay | Admitting: Primary Care

## 2022-09-09 DIAGNOSIS — I4892 Unspecified atrial flutter: Secondary | ICD-10-CM

## 2022-09-09 DIAGNOSIS — I10 Essential (primary) hypertension: Secondary | ICD-10-CM

## 2022-09-09 NOTE — Telephone Encounter (Signed)
Prescription refill request for Eliquis received. Indication: Aflutter  Last office visit: 08/25/22 Quentin Ore)  Scr: 1.30 (08/25/22)  Age: 67 Weight: 49kg  Appropriate dose and refill sent to requested pharmacy.

## 2022-09-21 ENCOUNTER — Other Ambulatory Visit: Payer: Self-pay | Admitting: Cardiology

## 2022-10-13 ENCOUNTER — Other Ambulatory Visit (INDEPENDENT_AMBULATORY_CARE_PROVIDER_SITE_OTHER): Payer: Self-pay | Admitting: Primary Care

## 2022-11-02 ENCOUNTER — Other Ambulatory Visit: Payer: Self-pay | Admitting: Cardiology

## 2022-11-04 ENCOUNTER — Ambulatory Visit (INDEPENDENT_AMBULATORY_CARE_PROVIDER_SITE_OTHER): Payer: 59 | Admitting: Primary Care

## 2022-11-04 ENCOUNTER — Encounter (INDEPENDENT_AMBULATORY_CARE_PROVIDER_SITE_OTHER): Payer: Self-pay | Admitting: Primary Care

## 2022-11-04 VITALS — BP 155/95 | HR 77 | Resp 16 | Ht 69.0 in | Wt 108.2 lb

## 2022-11-04 DIAGNOSIS — I10 Essential (primary) hypertension: Secondary | ICD-10-CM | POA: Diagnosis not present

## 2022-11-04 DIAGNOSIS — Z23 Encounter for immunization: Secondary | ICD-10-CM

## 2022-11-04 DIAGNOSIS — Z1211 Encounter for screening for malignant neoplasm of colon: Secondary | ICD-10-CM

## 2022-11-04 DIAGNOSIS — Z122 Encounter for screening for malignant neoplasm of respiratory organs: Secondary | ICD-10-CM

## 2022-11-14 NOTE — Progress Notes (Signed)
Veronica Stewart, is a 67 y.o. female  [id number]  [id number]  DOB - 1955/12/09  Chief Complaint  Patient presents with   Hypertension       Subjective:   Veronica Stewart is a 67 y.o. female here today for a follow up visit for the management of HTN.. Patient has No headache, No chest pain, No abdominal pain - No Nausea, No new weakness tingling or numbness, No Cough - shortness of breath  No problems updated.  No Known Allergies  Past Medical History:  Diagnosis Date   Asthma    Atrial fibrillation ([place])    Dermatophytosis of nail    Hypertension    Oropharyngeal dysphagia    Squamous cell carcinoma of nasal cavity (HCC)     Current Outpatient Medications on File Prior to Visit  Medication Sig Dispense Refill   acetaminophen (TYLENOL) 500 MG tablet Take 500 mg by mouth every 6 (six) hours as needed for mild pain.     albuterol (VENTOLIN HFA) 108 (90 Base) MCG/ACT inhaler INHALE 2 PUFFS BY MOUTH EVERY 6 HOURS AS NEEDED FOR WHEEZING AND SHORTNESS OF BREATH 8.5 g 2   amiodarone (PACERONE) 200 MG tablet TAKE 1 TABLET BY MOUTH DAILY 100 tablet 2   amLODipine (NORVASC) 5 MG tablet TAKE 1 TABLET BY MOUTH DAILY 100 tablet 2   apixaban (ELIQUIS) 5 MG TABS tablet TAKE 1 TABLET(5 MG) BY MOUTH TWICE DAILY 60 tablet 5   atorvastatin (LIPITOR) 20 MG tablet Take 1 tablet (20 mg total) by mouth daily. 90 tablet 3   DEEP SEA NASAL SPRAY 0.65 % nasal spray SMARTSIG:Both Nares     feeding supplement (ENSURE ENLIVE / ENSURE PLUS) LIQD Take 237 mLs by mouth 3 (three) times daily between meals. 237 mL 12   fluticasone (FLONASE) 50 MCG/ACT nasal spray SHAKE LIQUID AND USE 2 SPRAYS IN EACH NOSTRIL DAILY 48 g 0   Fluticasone-Salmeterol (ADVAIR) 100-50 MCG/DOSE AEPB Inhale 1 puff into the lungs 2 (two) times daily. 1 each 3   hydrALAZINE (APRESOLINE) 25 MG tablet Take 1 tablet (25 mg total) by mouth 2 (two) times daily. (Patient taking differently: Take 25 mg by  mouth 3 (three) times daily.) 180 tablet 3   loratadine (CLARITIN) 10 MG tablet Take 1 tablet (10 mg total) by mouth daily. 30 tablet 11   metoprolol succinate (TOPROL-XL) 25 MG 24 hr tablet Take 1 tablet (25 mg total) by mouth daily. Take with or immediately following a meal. 90 tablet 3   ondansetron (ZOFRAN) 8 MG tablet Take 8 mg by mouth 3 (three) times daily.     pantoprazole (PROTONIX) 40 MG tablet Take 1 tablet (40 mg total) by mouth 2 (two) times daily. 180 tablet 0   sodium chloride 1 g tablet Take 1 g by mouth 2 (two) times daily.     Current Facility-Administered Medications on File Prior to Visit  Medication Dose Route Frequency Provider Last Rate Last Admin   albuterol (PROVENTIL) (2.5 MG/3ML) 0.083% nebulizer solution 2.5 mg  2.5 mg Nebulization Once Kerin Perna, NP        Objective:   Vitals:   11/04/22 1559  BP: (Abnormal) 155/95  Pulse: 77  Resp: 16  SpO2: 97%  Weight: 108 lb 3.2 oz (49.1 kg)  Height: 5\' 9"  (1.753 m)    Comprehensive ROS Pertinent positive and negative noted in HPI   Exam General appearance : Awake, alert, not in any distress. Speech Clear. Not toxic  looking HEENT: Atraumatic and Normocephalic, pupils equally reactive to light and accomodation Neck: Supple, no JVD. No cervical lymphadenopathy.  Chest: Good air entry bilaterally, no added sounds  CVS: S1 S2 regular, no murmurs.  Abdomen: Bowel sounds present, Non tender and not distended with no gaurding, rigidity or rebound. Extremities: B/L Lower Ext shows no edema, both legs are warm to touch Neurology: Awake alert, and oriented X 3, CN II-XII intact, Non focal Skin: No Rash  Data Review Lab Results  Component Value Date   HGBA1C 5.2 03/26/2019    Assessment & Plan   Jakhya was seen today for hypertension.  Diagnoses and all orders for this visit:  Need for immunization against influenza -     Flu Vaccine QUAD High Dose(Fluad)  Screening for lung cancer -     CT CHEST  LUNG CA SCREEN LOW DOSE W/O CM; Future  Colon cancer screening -     Ambulatory referral to Gastroenterology  Essential hypertension  Elevated at this visit managed by Evans Lance, MD, Cardiology D/W Dash diet and exercise as tolerated take medications as prescribed  Patient have been counseled extensively about nutrition and exercise. Other issues discussed during this visit include: low cholesterol diet, weight control and daily exercise, foot care, annual eye examinations at Ophthalmology, importance of adherence with medications and regular follow-up. We also discussed long term complications of uncontrolled diabetes and hypertension.   Return for MAW.  The patient was given clear instructions to go to ER or return to medical center if symptoms don't improve, worsen or new problems develop. The patient verbalized understanding. The patient was told to call to get lab results if they haven't heard anything in the next week.   This note has been created with Surveyor, quantity. Any transcriptional errors are unintentional.   Kerin Perna, NP 11/14/2022, 6:01 PM

## 2022-11-17 ENCOUNTER — Other Ambulatory Visit: Payer: Self-pay | Admitting: Cardiology

## 2022-11-17 DIAGNOSIS — I4892 Unspecified atrial flutter: Secondary | ICD-10-CM

## 2022-11-17 NOTE — Telephone Encounter (Signed)
Prescription refill request for Eliquis received. Indication: Aflutter Last office visit: 08/25/22 Veronica Stewart)  Scr: 1.30 (08/25/22)  Age: 67 Weight: 49.1kg  Appropriate dose. Refill sent.

## 2022-12-08 ENCOUNTER — Encounter: Payer: Self-pay | Admitting: Podiatry

## 2022-12-08 ENCOUNTER — Ambulatory Visit (INDEPENDENT_AMBULATORY_CARE_PROVIDER_SITE_OTHER): Payer: 59 | Admitting: Podiatry

## 2022-12-08 DIAGNOSIS — M79674 Pain in right toe(s): Secondary | ICD-10-CM | POA: Diagnosis not present

## 2022-12-08 DIAGNOSIS — Q828 Other specified congenital malformations of skin: Secondary | ICD-10-CM

## 2022-12-08 DIAGNOSIS — B351 Tinea unguium: Secondary | ICD-10-CM

## 2022-12-08 DIAGNOSIS — M79675 Pain in left toe(s): Secondary | ICD-10-CM | POA: Diagnosis not present

## 2022-12-08 NOTE — Progress Notes (Signed)
This patient returns to my office for at risk foot care.  This patient requires this care by a professional since this patient will be at risk due to having PAD and coagulation defect due to eliquis.  This patient is unable to cut nails herself since the patient cannot reach her nails.These nails are painful walking and wearing shoes.  This patient presents for at risk foot care today.  General Appearance  Alert, conversant and in no acute stress.  Vascular  Dorsalis pedis and posterior tibial  pulses are palpable  bilaterally.  Capillary return is within normal limits  bilaterally. Temperature is within normal limits  bilaterally.  Neurologic  Senn-Weinstein monofilament wire test within normal limits  bilaterally. Muscle power within normal limits bilaterally.  Nails Thick disfigured discolored nails with subungual debris  from hallux to fifth toes left and second to fifth toes right foot.. No evidence of bacterial infection or drainage bilaterally.  Orthopedic  No limitations of motion  feet .  No crepitus or effusions noted.  No bony pathology or digital deformities noted.  Skin  normotropic skin noted bilaterally.  No signs of infections or ulcers noted.   Porokeratosis sub 5th metabase  B/L. Porokeratosis sub 5th right foot.  Onychomycosis  Pain in right toes  Pain in left toes   Porokeratosis  B/l.  Consent was obtained for treatment procedures.   Mechanical debridement of nails 1-5 left and 2-5 right foot.performed with a nail nipper.  Filed with dremel without incident.  Debride porokeratosis sub 5th metabase  B/L and sub 5th right with # 15 blade and dremel tool.   Return office visit   3 months                   Told patient to return for periodic foot care and evaluation due to potential at risk complications.   Helane Gunther DPM

## 2022-12-13 ENCOUNTER — Ambulatory Visit (HOSPITAL_BASED_OUTPATIENT_CLINIC_OR_DEPARTMENT_OTHER)
Admission: RE | Admit: 2022-12-13 | Discharge: 2022-12-13 | Disposition: A | Discharge status: Home / Self Care | Payer: 59 | Source: Ambulatory Visit | Attending: Primary Care | Admitting: Primary Care

## 2022-12-13 DIAGNOSIS — I700 Atherosclerosis of aorta: Secondary | ICD-10-CM | POA: Diagnosis not present

## 2022-12-13 DIAGNOSIS — F17210 Nicotine dependence, cigarettes, uncomplicated: Secondary | ICD-10-CM | POA: Insufficient documentation

## 2022-12-13 DIAGNOSIS — I2510 Atherosclerotic heart disease of native coronary artery without angina pectoris: Secondary | ICD-10-CM | POA: Diagnosis not present

## 2022-12-13 DIAGNOSIS — Z122 Encounter for screening for malignant neoplasm of respiratory organs: Secondary | ICD-10-CM

## 2022-12-13 DIAGNOSIS — J439 Emphysema, unspecified: Secondary | ICD-10-CM | POA: Diagnosis not present

## 2022-12-14 ENCOUNTER — Other Ambulatory Visit (INDEPENDENT_AMBULATORY_CARE_PROVIDER_SITE_OTHER): Payer: Self-pay | Admitting: Primary Care

## 2022-12-14 DIAGNOSIS — J301 Allergic rhinitis due to pollen: Secondary | ICD-10-CM

## 2022-12-14 NOTE — Telephone Encounter (Signed)
Medication Refill - Medication: loratadine (CLARITIN) 10 MG tablet   Has the patient contacted their pharmacy? No. (Agent: If no, request that the patient contact the pharmacy for the refill. If patient does not wish to contact the pharmacy document the reason why and proceed with request.) (Agent: If yes, when and what did the pharmacy advise?)  Preferred Pharmacy (with phone number or street name):  [facility] #[id number] Ginette Otto, [place] - [address] AT [facility] Phone: [phone]  Fax: [phone]     Has the patient been seen for an appointment in the last year OR does the patient have an upcoming appointment? No.  Agent: Please be advised that RX refills may take up to 3 business days. We ask that you follow-up with your pharmacy.

## 2022-12-15 ENCOUNTER — Other Ambulatory Visit: Payer: Self-pay | Admitting: Cardiology

## 2022-12-15 NOTE — Telephone Encounter (Signed)
Rx 01/06/22 #30 11RF- too soon Requested Prescriptions  Pending Prescriptions Disp Refills   loratadine (CLARITIN) 10 MG tablet 30 tablet 11    Sig: Take 1 tablet (10 mg total) by mouth daily.     Ear, Nose, and Throat:  Antihistamines 2 Failed - 12/14/2022  2:19 PM      Failed - Cr in normal range and within 360 days    Creatinine, Ser  Date Value Ref Range Status  08/25/2022 1.30 (H) 0.57 - 1.00 mg/dL Final         Passed - Valid encounter within last 12 months    Recent Outpatient Visits           1 month ago Need for immunization against influenza   [place] [facility] [name], NP   1 year ago Essential hypertension   [place] [facility] [name], NP   1 year ago Hospital discharge follow-up   [place] [facility] [name], NP   1 year ago Hospital discharge follow-up   [place] [facility] [name], NP   2 years ago Hospital discharge follow-up   [place] [facility] [name], NP       Future Appointments             In 2 months Little Ishikawa, MD [facility] at [facility]

## 2022-12-22 ENCOUNTER — Telehealth (INDEPENDENT_AMBULATORY_CARE_PROVIDER_SITE_OTHER): Payer: Self-pay

## 2022-12-22 ENCOUNTER — Telehealth (INDEPENDENT_AMBULATORY_CARE_PROVIDER_SITE_OTHER): Payer: Self-pay | Admitting: Primary Care

## 2022-12-22 NOTE — Telephone Encounter (Signed)
Called patient to schedule Medicare Annual Wellness Visit (AWV). Left message for patient to call back and schedule Medicare Annual Wellness Visit (AWV).  Last date of AWV: due 09/16/2022 awvi per [facility]  Please schedule an appointment at any time with Abby, NHA. .  If any questions, please contact me at [phone].  Thank you,  Stephanie,  AMB Clinical Support [facility] AWV Program Direct Dial ??[phone]    

## 2022-12-22 NOTE — Telephone Encounter (Signed)
Contacted pt to go over CT results pt is aware and doesn't have any questions

## 2022-12-30 ENCOUNTER — Encounter (INDEPENDENT_AMBULATORY_CARE_PROVIDER_SITE_OTHER): Payer: Self-pay

## 2023-01-12 ENCOUNTER — Encounter: Payer: Self-pay | Admitting: Primary Care

## 2023-02-04 ENCOUNTER — Other Ambulatory Visit (INDEPENDENT_AMBULATORY_CARE_PROVIDER_SITE_OTHER): Payer: Self-pay | Admitting: Primary Care

## 2023-02-04 DIAGNOSIS — J301 Allergic rhinitis due to pollen: Secondary | ICD-10-CM

## 2023-02-17 ENCOUNTER — Other Ambulatory Visit: Payer: Self-pay | Admitting: Cardiology

## 2023-02-17 DIAGNOSIS — I10 Essential (primary) hypertension: Secondary | ICD-10-CM

## 2023-02-28 NOTE — Progress Notes (Signed)
Cardiology Office Note:    Date:  03/03/2023   ID:  Particia Stewart, DOB 1956/02/07, MRN [id number]  PCP:  [name], NP  Cardiologist:  Little Ishikawa, MD  Electrophysiologist:  None   Referring MD: [name], NP   Chief Complaint  Patient presents with   Atrial Fibrillation     History of Present Illness:    Veronica Stewart is a 67 y.o. female with a hx of atrial flutter, PAD status post stent pop bypass, hypertension, asthma who presents for follow-up.  She was admitted to [facility] on 02/11/2021 with epistaxis and new onset atrial flutter.  Anticoagulation was initially held due to her epistaxis and she was evaluated by ENT.  Underwent packing in epistaxis resolved, and she was started on Eliquis, which he tolerated well.  She had been on aspirin/Plavix for her PAD given her left external iliac stenting in August 2021.  She was transitioned to Eliquis monotherapy.  Echocardiogram 02/13/2020 showed EF 40 to 45%.  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  She completed adjuvant chemotherapy and radiation.  Echocardiogram 02/22/2022 showed EF 55 to 60%, normal diastolic function, normal RV function, RVSP 41 mmHg, mild MR, mild to moderate TR.  Zio patch was worn for 9 days 02/2022 the only had 1 day of analysis time, no significant arrhythmias were seen.   Since last clinic visit, she reports she has been doing well.  Denies any chest pain, dyspnea, lightheadedness, syncope, lower extremity edema, or palpitations.  Denies any bleeding on Eliquis.  She is smoking about 8 cigarettes/day.  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[facility])     Past Surgical History:   Procedure Laterality Date   ABDOMINAL AORTOGRAM W/LOWER EXTREMITY Bilateral 03/05/2020   Procedure: ABDOMINAL AORTOGRAM W/LOWER EXTREMITY;  Surgeon: Cephus Shelling, MD;  Location: [facility] INVASIVE CV LAB;  Service: Cardiovascular;  Laterality: Bilateral;   ENDARTERECTOMY FEMORAL Left 03/24/2020   Procedure: LEFT COMMON FEMORAL ENDARTERECTOMY;  Surgeon: Cephus Shelling, MD;  Location: [facility] OR;  Service: Vascular;  Laterality: Left;   ENDOVEIN HARVEST OF GREATER SAPHENOUS VEIN Left 03/24/2020   Procedure: HARVEST OF GREATER SAPHENOUS VEIN;  Surgeon: Cephus Shelling, MD;  Location: [facility] OR;  Service: Vascular;  Laterality: Left;   FEMORAL-POPLITEAL BYPASS GRAFT Left 03/24/2020   Procedure: BYPASS GRAFT FEMORAL-POPLITEAL ARTERY;  Surgeon: Cephus Shelling, MD;  Location: [facility] OR;  Service: Vascular;  Laterality: Left;   INSERTION OF ILIAC STENT Left 03/24/2020   Procedure: INSERTION OF RETROGRADE ILIAC STENT;  Surgeon: Cephus Shelling, MD;  Location: [facility] OR;  Service: Vascular;  Laterality: Left;   INTRAMEDULLARY (IM) NAIL INTERTROCHANTERIC Left 03/18/2018   Procedure: INTRAMEDULLARY (IM) NAIL INTERTROCHANTRIC;  Surgeon: Roby Lofts, MD;  Location: [facility] OR;  Service: Orthopedics;  Laterality: Left;   INTRAOPERATIVE ARTERIOGRAM Left 03/24/2020   Procedure: INTRA OPERATIVE ARTERIOGRAM;  Surgeon: Cephus Shelling, MD;  Location: [facility] OR;  Service: Vascular;  Laterality: Left;    Current Medications: Current Meds  Medication Sig   acetaminophen (TYLENOL) 500 MG tablet Take 500 mg by mouth every 6 (six) hours as needed for mild pain.   amiodarone (PACERONE) 200 MG  tablet TAKE 1 TABLET BY MOUTH DAILY   amLODipine (NORVASC) 5 MG tablet TAKE 1 TABLET BY MOUTH DAILY   aspirin EC 81 MG tablet Take 81 mg by mouth daily. Swallow whole.   DEEP SEA NASAL SPRAY 0.65 % nasal spray SMARTSIG:Both Nares   ELIQUIS 5 MG TABS tablet TAKE 1 TABLET BY MOUTH TWICE  DAILY   feeding supplement (ENSURE ENLIVE /  ENSURE PLUS) LIQD Take 237 mLs by mouth 3 (three) times daily between meals.   fluticasone (FLONASE) 50 MCG/ACT nasal spray SHAKE LIQUID AND USE 2 SPRAYS IN EACH NOSTRIL DAILY   Fluticasone-Salmeterol (ADVAIR) 100-50 MCG/DOSE AEPB Inhale 1 puff into the lungs 2 (two) times daily.   hydrALAZINE (APRESOLINE) 25 MG tablet TAKE 1 TABLET BY MOUTH TWICE  DAILY   loratadine (CLARITIN) 10 MG tablet TAKE 1 TABLET(10 MG) BY MOUTH DAILY   metoprolol succinate (TOPROL-XL) 25 MG 24 hr tablet TAKE 1 TABLET BY MOUTH DAILY  WITH A MEAL OR IMMEDIATELY AFTER THE SAME MEAL   ondansetron (ZOFRAN) 8 MG tablet Take 8 mg by mouth 3 (three) times daily.   pantoprazole (PROTONIX) 40 MG tablet TAKE 1 TABLET(40 MG) BY MOUTH TWICE DAILY   Current Facility-Administered Medications for the 03/03/23 encounter (Office Visit) with Little Ishikawa, MD  Medication   albuterol (PROVENTIL) (2.5 MG/3ML) 0.083% nebulizer solution 2.5 mg     Allergies:   Patient has no known allergies.   Social History   Socioeconomic History   Marital status: Legally Separated    Spouse name: Not on file   Number of children: Not on file   Years of education: Not on file   Highest education level: Not on file  Occupational History   Not on file  Tobacco Use   Smoking status: Every Day    Current packs/day: 0.50    Types: Cigarettes   Smokeless tobacco: Never  Vaping Use   Vaping status: Never Used  Substance and Sexual Activity   Alcohol use: Yes    Comment: two 40 oz beers most days    Drug use: Not Currently    Types: Marijuana   Sexual activity: Not on file  Other Topics Concern   Not on file  Social History Narrative   Not on file   Social Determinants of Health   Financial Resource Strain: Not on file  Food Insecurity: Not on file  Transportation Needs: Not on file  Physical Activity: Not on file  Stress: Not on file  Social Connections: Not on file     Family History: [name] is  negative for Sudden Cardiac Death.  ROS:   Please see the history of present illness.      All other systems reviewed and are negative.  EKGs/Labs/Other Studies Reviewed:    The following studies were reviewed today:   EKG:   03/03/2023: Sinus bradycardia, rate 58, poor R wave progression, no ST abnormalities, low voltage 03/23/22: Sinus rhythm, rate 60, first-degree AV block, poor R wave progression, Q waves in V1-3 02/09/2022: Sinus bradycardia, rate 55, Q waves in V1-3 05/11/21: Normal sinus rhythm, rate 73, Q waves V1-4 07/22:sinus rhythm, rate 63, Q-wave V1-V4  Recent Labs: 08/25/2022: ALT 17; BUN 14; Creatinine, Ser 1.30; Potassium 5.1; Sodium 137; TSH 2.170  Recent Lipid Panel    Component Value Date/Time   CHOL 89 03/25/2020 0216   CHOL 173 03/26/2019 1450   TRIG 31 03/25/2020 0216   HDL 44 03/25/2020 0216   HDL  102 03/26/2019 1450   CHOLHDL 2.0 03/25/2020 0216   VLDL 6 03/25/2020 0216   LDLCALC 39 03/25/2020 0216   LDLCALC 48 03/26/2019 1450    Physical Exam:    VS:  BP 134/88 (BP Location: Left Arm, Patient Position: Sitting, Cuff Size: Small)   Pulse (!) 58   Ht 5\' 9"  (1.753 m)   Wt 105 lb (47.6 kg)   SpO2 98%   BMI 15.51 kg/m     Wt Readings from Last 3 Encounters:  03/03/23 105 lb (47.6 kg)  12/13/22 (P) 108 lb (49 kg)  11/04/22 108 lb 3.2 oz (49.1 kg)     GEN: Cachectic, in no acute distress NECK: No JVD CARDIAC: RRR, no murmurs, rubs, gallops RESPIRATORY:  Clear to auscultation without rales, wheezing or rhonchi  ABDOMEN: Soft, non-tender, non-distended MUSCULOSKELETAL:  No edema; SKIN: Warm and dry NEUROLOGIC:  Alert and oriented x 3 PSYCHIATRIC:  Normal affect   ASSESSMENT:    1. Atrial flutter, unspecified type (HCC)   2. PAD (peripheral artery disease) (HCC)   3. Chronic combined systolic and diastolic heart failure (HCC)   4. Smoking   5. Essential hypertension      PLAN:    Atrial Flutter: Noted during admission 01/2021, rates up  to 140s.  CHADSVASc score 4 (CHF, HTN, PAD, female).  Echo 02/12/21 shows EF 40 to 45%.  Echocardiogram 02/22/2022 showed EF 55 to 60%, normal diastolic function, normal RV function, RVSP 41 mmHg, mild MR, mild to moderate TR.  Zio patch was worn for 9 days 02/2022 the only had 1 day of analysis time, no significant arrhythmias were seen. -Given she was going in and out of atrial flutter during admission, was started on amiodarone to maintain sinus rhythm.  Appears to be maintaining sinus rhythm on amiodarone 200 mg daily.  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  Referred to EP to evaluate for ablation, recommended continuing amiodarone at this time -Continue Eliquis 5 mg twice daily -Continue Toprol-XL 25 mg daily   Chronic combined systolic and diastolic heart failure: Echo 02/12/2021 shows EF 40 to 45%.  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  Echocardiogram 02/22/2022 showed EF 55 to 60%, normal diastolic function, normal RV function, RVSP 41 mmHg, mild MR, mild to moderate TR. 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  Currently on amlodipine 10 mg daily, hydralazine 25 mg 2 times daily and Toprol-XL 25 mg daily   PAD s/p fem-pop bypass: with left external iliac stenting 8/21. Has been on ASA/plavix since that time.  During admission in June 2022, aspirin/Plavix were discontinued and switched to Eliquis monotherapy given her new atrial flutter as above.  Continue Eliquis, atorvastatin  Hyperlipidemia: LDL 65 on 08/18/2021.  Continue atorvastatin 20 mg  daily.  Tobacco use: Counseled risk of tobacco use and cessation strongly encouraged.  Will ask out care coordinator to reach out to patient to aid in cessation  AKI: Creatinine up to 1.76 on 02/22/2022.  Likely due to lisinopril-HCTZ.  Medication discontinued, improvement in creatinine to 1.17 on 03/15/2022.  Most recent creatinine 1.30 on 08/25/2022.  Check CMET   RTC in 6 months  Medication Adjustments/Labs and Tests Ordered: Current medicines are reviewed at length with the patient today.  Concerns regarding medicines are outlined above.  Orders Placed This Encounter  Procedures   CBC   Comprehensive metabolic panel   TSH   Referral to HRT/VAS Care Navigation   EKG 12-Lead   ECHOCARDIOGRAM COMPLETE     No orders of the defined types were placed in this encounter.     Patient Instructions  Medication Instructions:   No changes   *If you need a refill on your cardiac medications before your next appointment, please call your pharmacy*   Lab Work: CBC CMP TSH   If you have labs (blood work) drawn today and your tests are completely normal, you will receive your results only by: MyChart Message (if you have MyChart) OR A paper copy in the mail If you have any lab test that is abnormal or we need to change your treatment, we will call you to review the results.   Testing/Procedures: Will be schedule at [address] Your physician has requested that you have an echocardiogram. Echocardiography is a painless test that uses sound waves to create images of your heart. It provides your doctor with information about the size and shape of your heart and how well your heart's chambers and valves are working. This procedure takes approximately one hour. There are no restrictions for this procedure. Please do NOT wear cologne, perfume, aftershave, or lotions (deodorant is allowed). Please arrive 15 minutes prior to your appointment time.   Follow-Up: At  [facility], you and your health needs are our priority.  As part of our continuing mission to provide you with exceptional heart care, we have created designated Provider Care Teams.  These Care Teams include your primary Cardiologist (physician) and Advanced Practice Providers (APPs -  Physician Assistants and Nurse Practitioners) who all work together to provide you with the care you need, when you need it.  We recommend signing up for the patient portal called "MyChart".  Sign up information is provided on this After Visit Summary.  MyChart is used to connect with patients for Virtual Visits (Telemedicine).  Patients are able to view lab/test results, encounter notes, upcoming appointments, etc.  Non-urgent messages can be sent to your provider as well.   To learn more about what you can do with MyChart, go to [email or web address].    Your next appointment:   6 month(s)  The format for your next appointment:   In Person  Provider:   Little Ishikawa, MD   You have been referred to [facility] guide to assist you in quitting smoking. She will contact you.  Other Instructions   Your physician discussed the hazards of tobacco use. Tobacco use cessation is recommended and techniques and options to help you quit were discussed.       Signed, Little Ishikawa, MD  03/03/2023 5:08 PM    [facility]

## 2023-03-03 ENCOUNTER — Ambulatory Visit: Payer: 59 | Attending: Cardiology | Admitting: Cardiology

## 2023-03-03 ENCOUNTER — Encounter: Payer: Self-pay | Admitting: Cardiology

## 2023-03-03 VITALS — BP 134/88 | HR 58 | Ht 69.0 in | Wt 105.0 lb

## 2023-03-03 DIAGNOSIS — I739 Peripheral vascular disease, unspecified: Secondary | ICD-10-CM

## 2023-03-03 DIAGNOSIS — I10 Essential (primary) hypertension: Secondary | ICD-10-CM

## 2023-03-03 DIAGNOSIS — F17200 Nicotine dependence, unspecified, uncomplicated: Secondary | ICD-10-CM | POA: Diagnosis not present

## 2023-03-03 DIAGNOSIS — I5042 Chronic combined systolic (congestive) and diastolic (congestive) heart failure: Secondary | ICD-10-CM

## 2023-03-03 DIAGNOSIS — I4892 Unspecified atrial flutter: Secondary | ICD-10-CM

## 2023-03-03 NOTE — Patient Instructions (Addendum)
Medication Instructions:   No changes   *If you need a refill on your cardiac medications before your next appointment, please call your pharmacy*   Lab Work: CBC CMP TSH   If you have labs (blood work) drawn today and your tests are completely normal, you will receive your results only by: MyChart Message (if you have MyChart) OR A paper copy in the mail If you have any lab test that is abnormal or we need to change your treatment, we will call you to review the results.   Testing/Procedures: Will be schedule at [address] Your physician has requested that you have an echocardiogram. Echocardiography is a painless test that uses sound waves to create images of your heart. It provides your doctor with information about the size and shape of your heart and how well your heart's chambers and valves are working. This procedure takes approximately one hour. There are no restrictions for this procedure. Please do NOT wear cologne, perfume, aftershave, or lotions (deodorant is allowed). Please arrive 15 minutes prior to your appointment time.   Follow-Up: At [facility], you and your health needs are our priority.  As part of our continuing mission to provide you with exceptional heart care, we have created designated Provider Care Teams.  These Care Teams include your primary Cardiologist (physician) and Advanced Practice Providers (APPs -  Physician Assistants and Nurse Practitioners) who all work together to provide you with the care you need, when you need it.  We recommend signing up for the patient portal called "MyChart".  Sign up information is provided on this After Visit Summary.  MyChart is used to connect with patients for Virtual Visits (Telemedicine).  Patients are able to view lab/test results, encounter notes, upcoming appointments, etc.  Non-urgent messages can be sent to your provider as well.   To learn more about what you can do with MyChart, go  to [email or web address].    Your next appointment:   6 month(s)  The format for your next appointment:   In Person  Provider:   Little Ishikawa, MD   You have been referred to [facility] guide to assist you in quitting smoking. She will contact you.  Other Instructions   Your physician discussed the hazards of tobacco use. Tobacco use cessation is recommended and techniques and options to help you quit were discussed.

## 2023-03-07 ENCOUNTER — Telehealth: Payer: Self-pay

## 2023-03-07 DIAGNOSIS — Z0000 Encounter for general adult medical examination without abnormal findings: Secondary | ICD-10-CM

## 2023-03-07 NOTE — Telephone Encounter (Signed)
Called patient per health coaching referral from Dr. Bjorn Pippin for smoking cessation. Patient expressed interest in health coaching and has been scheduled for her initial session over the phone at 9:30am.   Renaee Munda, MS, ERHD, [facility]  Care Guide, Health & Wellness Coach [address] [place] [address] Telephone: [phone] Email: [email or web address]

## 2023-03-08 ENCOUNTER — Ambulatory Visit: Payer: 59

## 2023-03-08 DIAGNOSIS — Z0000 Encounter for general adult medical examination without abnormal findings: Secondary | ICD-10-CM

## 2023-03-08 NOTE — Progress Notes (Signed)
HEALTH & WELLNESS COACHING INITIAL INTAKE               Start time: 9:30am   End time: 10:02am   Total Mins: 32 minutes     What are the Patient's goals from Coaching?  Smoking Cessation   Why did they seek coaching now? Patient stated that she previously had nose cancer and will be checking for lung cancer soon and is motivated to quit smoking to improve her health.    Readiness  Patient is in the preparation stage of smoking cessation. An action plan was co-created towards to take steps towards smoking cessation over time.   Agreement Signed & Returned? Reviewed Coaching Agreement and Code of Ethics with Patient during initial session. Answered any questions the patient had if any regarding the Coaching Agreement and Code of Ethics. Patient verbally agreed to adhere to the Coaching Agreement and to abide by the Code of Ethics.  Mailed patient with a hard/electronic copy of the Coaching Agreement and Code of Ethics.     Coaching Progress Notes: Patient stated that she has been smoking since she was 67 years old. Patient mentioned that she has not tried quitting smoking before but is motivated because she was diagnosed with nose cancer and will be screened for lung cancer. Patient reported that she smokes about 10 cigarettes per day.  Patient reported that smoking is a routine for her. Patient's smoking routine is as follows: Soon after waking up in the morning and getting out of bed by 11am After eating When watching tv Using the bathroom Before going to bed between 9:30-10:00pm  Patient stated that she can find herself smoking 10 minutes after finishing one cigarette but is not sure what the longest duration of time she can go without smoking. Patient smokes immediately after eating. Patient shared that a trigger to smoking is being around others or seeing others smoke.  Patient stated reported that she smokes Mavericks (long) and smokes the entire cigarette at a time.  Patient shared that she keeps her cigarettes by her bedside and takes them around with her upon leaving the bedroom. Discussed with patient the idea of leaving the cigarettes in the bedroom so that she does not have them readily on hand to smoke. Patient was apprehensive to leaving cigarettes in her bedroom to deter her from smoking as often.   Patient expressed that she doesn't have a support person that does not smoke that could hold her accountable. Patient has not set a quit date currently.     Coaching Outcomes Patient wants to reduce her smoking over time without NRT or medication.  Patient's identified goal and action steps are outlined below that she will work towards implementing over the next two weeks.  Patient was mailed a copy of her action steps.   Overall Goal(s): To quit smoking by reducing the number of cigarettes smoked every two weeks over the next three months                                Agreement/Action Steps:   Aim to smoke 9 cigarettes per day Wait at least 15 minutes after smoking before smoking again Get dressed in the morning and eat breakfast before smoking first cigarette    Additional resources provided: Mailed patient the Quit4Life workbook to aid in brainstorming different strategies that she thinks would be beneficial in helping her quit smoking.

## 2023-03-10 ENCOUNTER — Other Ambulatory Visit (INDEPENDENT_AMBULATORY_CARE_PROVIDER_SITE_OTHER): Payer: Self-pay | Admitting: Primary Care

## 2023-03-10 DIAGNOSIS — J301 Allergic rhinitis due to pollen: Secondary | ICD-10-CM

## 2023-03-10 NOTE — Telephone Encounter (Signed)
Pt stated she contacted her pharmacy because the pill bottles says 11 refills but she was told to call the doctor office.   Medication Refill - Medication: loratadine (CLARITIN) 10 MG tablet   Has the patient contacted their pharmacy? Yes.  Preferred Pharmacy (with phone number or street name):  [facility] #[id number] Ginette Otto, [place] - [address] AT [facility] Phone: [phone]  Fax: [phone]     Has the patient been seen for an appointment in the last year OR does the patient have an upcoming appointment? Yes.    Agent: Please be advised that RX refills may take up to 3 business days. We ask that you follow-up with your pharmacy.

## 2023-03-11 NOTE — Telephone Encounter (Signed)
Refilled 6/21.24 # 30 with 11 refills. Requested Prescriptions  Refused Prescriptions Disp Refills   loratadine (CLARITIN) 10 MG tablet 30 tablet 11     Ear, Nose, and Throat:  Antihistamines 2 Failed - 03/10/2023  3:56 PM      Failed - Cr in normal range and within 360 days    Creatinine, Ser  Date Value Ref Range Status  08/25/2022 1.30 (H) 0.57 - 1.00 mg/dL Final         Passed - Valid encounter within last 12 months    Recent Outpatient Visits           4 months ago Need for immunization against influenza   [place] [facility] [name], NP   1 year ago Essential hypertension   [place] [facility] [name], NP   1 year ago Hospital discharge follow-up   [place] [facility] [name], NP   2 years ago Hospital discharge follow-up   [place] [facility] [name], NP   2 years ago Hospital discharge follow-up   [facility] [name], NP

## 2023-03-14 ENCOUNTER — Ambulatory Visit: Payer: 59 | Admitting: Podiatry

## 2023-03-14 ENCOUNTER — Encounter: Payer: Self-pay | Admitting: Podiatry

## 2023-03-14 VITALS — BP 146/83 | HR 58

## 2023-03-14 DIAGNOSIS — B351 Tinea unguium: Secondary | ICD-10-CM

## 2023-03-14 DIAGNOSIS — M79674 Pain in right toe(s): Secondary | ICD-10-CM

## 2023-03-14 DIAGNOSIS — M79675 Pain in left toe(s): Secondary | ICD-10-CM

## 2023-03-14 DIAGNOSIS — Q828 Other specified congenital malformations of skin: Secondary | ICD-10-CM | POA: Diagnosis not present

## 2023-03-14 DIAGNOSIS — Z7901 Long term (current) use of anticoagulants: Secondary | ICD-10-CM | POA: Diagnosis not present

## 2023-03-14 NOTE — Progress Notes (Signed)
This patient returns to my office for at risk foot care.  This patient requires this care by a professional since this patient will be at risk due to having PAD and coagulation defect due to eliquis.  This patient is unable to cut nails herself since the patient cannot reach her nails.These nails are painful walking and wearing shoes.  She has painful callus both feet.. This patient presents for at risk foot care today.  General Appearance  Alert, conversant and in no acute stress.  Vascular  Dorsalis pedis and posterior tibial  pulses are palpable  bilaterally.  Capillary return is within normal limits  bilaterally. Temperature is within normal limits  bilaterally.  Neurologic  Senn-Weinstein monofilament wire test within normal limits  bilaterally. Muscle power within normal limits bilaterally.  Nails Thick disfigured discolored nails with subungual debris  from hallux to fifth toes left and second to fifth toes right foot.. No evidence of bacterial infection or drainage bilaterally.  Orthopedic  No limitations of motion  feet .  No crepitus or effusions noted.  No bony pathology or digital deformities noted.  Skin  normotropic skin noted bilaterally.  No signs of infections or ulcers noted.   Porokeratosis sub 5th metabase  B/L. Porokeratosis sub 5th right foot.  Onychomycosis  Pain in right toes  Pain in left toes   Porokeratosis  B/l.  Consent was obtained for treatment procedures.   Mechanical debridement of nails 1-5 left and 2-5 right foot.performed with a nail nipper.  Filed with dremel without incident.  Debride porokeratosis sub 5th metabase  B/L and sub 5th right with # 15 blade and dremel tool.   Return office visit   3 months                   Told patient to return for periodic foot care and evaluation due to potential at risk complications.   Helane Gunther DPM

## 2023-03-22 ENCOUNTER — Ambulatory Visit: Payer: 59 | Attending: Internal Medicine

## 2023-03-22 ENCOUNTER — Encounter (INDEPENDENT_AMBULATORY_CARE_PROVIDER_SITE_OTHER): Payer: Self-pay

## 2023-03-22 DIAGNOSIS — Z0000 Encounter for general adult medical examination without abnormal findings: Secondary | ICD-10-CM

## 2023-03-22 NOTE — Progress Notes (Signed)
Appointment Outcome: Completed, Session #: 1                        Start time: 11:02am   End time: 11:22am   Total Mins: 20 minutes  AGREEMENTS SECTION   Overall Goal(s): To quit smoking by reducing the number of cigarettes smoked every two weeks over the next three months   Agreement/Action Steps:   Aim to smoke 9 cigarettes per day Wait at least 15 minutes after smoking before smoking again Get dressed in the morning and eat breakfast before smoking first cigarette    Progress Notes:  Patient reported that she smokes an average of 7 cigarettes per day. Patient stated that there have been some days that she smoked more than 7 but isn't sure of the exact number of cigarettes she smoked on those days. Patient remembered that on the days that she smoked more than 7 cigarettes, she found herself smoking while watching tv and reading.   Patient stated that she has started leaving her pack of cigarettes in the bedroom. Patient shared this helped her cut back on smoking because she would have to go back and forth to get a cigarette when she had the urge to smoke. Patient shared that she is focused on cutting back smoking than she was before, and it is helping her with not smoking as much. Patient shared that she has waited to smoke after eating breakfast a few days. Patient stated that it did not bother her to wait longer to smoke her first cigarette.   Patient mentioned that she can go 15 minutes without smoking between cigarettes but does not remember what the longest time she has been able to go without smoking. Patient stated that when she is not smoking, she finds something to keep her busy. Patient expressed that she is not stressed so that does not contribute to her smoking. Patient mentioned that she doesn't get out much and that may be a contributing factor to her smoking. Patient reported that she lives with a family member that smokes and it triggers her to smoke at times.   Discussed  with patient how she could share her goal to quit smoking with her family member so they can be aware and set boundaries around smoking. Patient felt that this strategy would not be beneficial to share her goal. Discussed with patient tracking the longest duration of time that she goes without smoking.     Indicators of Success and Accountability:  What are the mile markers along your path to reaching your desired changes   Readiness: Patient is in the action stage of smoking cessation.  Strengths and Supports: Patient does not have additional support outside of health coaching. Patient is relying on being   Challenges and Barriers: Patient lives with a family member who smokes, which is a trigger for her.     Coaching Outcomes: Patient stated that she will continue to aim to smoke 7 cigarettes or less per day.   Patient mentioned that she will try to track the time between smoking.   Patient will continue to leave cigarettes in her room as a deterrent to smoke.   Patient stated that she will try to be more consistent with eating breakfast daily before smoking.   Patient will implement the following action steps outlined below to continue cutting back on smoking over the next two weeks.    Overall Goal(s): To quit smoking by reducing the number  of cigarettes smoked every two weeks over the next three months   Agreement/Action Steps:   Aim to smoke 7 cigarettes per day Track time between smoking cigarettes Eat breakfast before smoking first cigarette Leave cigarettes in bedroom   Attempted: Partial - Patient was able to implement parts of her bi-weekly agreement sporadically over the past two weeks.

## 2023-03-28 ENCOUNTER — Ambulatory Visit (HOSPITAL_COMMUNITY): Payer: 59 | Attending: Cardiology

## 2023-03-28 DIAGNOSIS — I5042 Chronic combined systolic (congestive) and diastolic (congestive) heart failure: Secondary | ICD-10-CM | POA: Insufficient documentation

## 2023-03-28 DIAGNOSIS — I4892 Unspecified atrial flutter: Secondary | ICD-10-CM | POA: Diagnosis present

## 2023-03-28 LAB — ECHOCARDIOGRAM COMPLETE
Area-P 1/2: 3.79 cm2
S' Lateral: 2.5 cm

## 2023-03-29 ENCOUNTER — Telehealth: Payer: Self-pay | Admitting: Cardiology

## 2023-03-29 ENCOUNTER — Other Ambulatory Visit: Payer: Self-pay

## 2023-03-29 DIAGNOSIS — D649 Anemia, unspecified: Secondary | ICD-10-CM

## 2023-03-29 NOTE — Telephone Encounter (Signed)
Patient called and stated she will not be able to return to have labs done tomorrow. She has to book transportation 3 days in advance so she will come back on Friday.

## 2023-03-29 NOTE — Telephone Encounter (Signed)
  The patient is calling back and mentioned that she cannot return tomorrow due to a lack of transportation, as she requires three days' notice. She would like to know if it's possible to schedule her lab work in three days

## 2023-03-30 ENCOUNTER — Other Ambulatory Visit: Payer: Self-pay

## 2023-03-30 DIAGNOSIS — D649 Anemia, unspecified: Secondary | ICD-10-CM

## 2023-03-30 NOTE — Telephone Encounter (Signed)
That is fine, in addition to checking iron studies can we also recheck CBC when she comes back?

## 2023-03-30 NOTE — Telephone Encounter (Signed)
CBC order placed to be done Fri 8/16.

## 2023-03-30 NOTE — Addendum Note (Signed)
Addended by: Neoma Laming on: 03/30/2023 03:51 PM   Modules accepted: Orders

## 2023-04-02 LAB — CBC WITH DIFFERENTIAL/PLATELET
Basophils Absolute: 0 10*3/uL (ref 0.0–0.2)
Basos: 1 %
EOS (ABSOLUTE): 0.2 10*3/uL (ref 0.0–0.4)
Eos: 4 %
Hematocrit: 29.3 % — ABNORMAL LOW (ref 34.0–46.6)
Hemoglobin: 7.7 g/dL — ABNORMAL LOW (ref 11.1–15.9)
Immature Grans (Abs): 0 10*3/uL (ref 0.0–0.1)
Immature Granulocytes: 0 %
Lymphocytes Absolute: 1.2 10*3/uL (ref 0.7–3.1)
Lymphs: 22 %
MCH: 16.8 pg — ABNORMAL LOW (ref 26.6–33.0)
MCHC: 26.3 g/dL — ABNORMAL LOW (ref 31.5–35.7)
MCV: 64 fL — ABNORMAL LOW (ref 79–97)
Monocytes Absolute: 0.7 10*3/uL (ref 0.1–0.9)
Monocytes: 14 %
Neutrophils Absolute: 3.2 10*3/uL (ref 1.4–7.0)
Neutrophils: 59 %
Platelets: 319 10*3/uL (ref 150–450)
RBC: 4.58 x10E6/uL (ref 3.77–5.28)
RDW: 21.3 % — ABNORMAL HIGH (ref 11.7–15.4)
WBC: 5.4 10*3/uL (ref 3.4–10.8)

## 2023-04-04 ENCOUNTER — Other Ambulatory Visit (INDEPENDENT_AMBULATORY_CARE_PROVIDER_SITE_OTHER): Payer: Self-pay | Admitting: Primary Care

## 2023-04-04 DIAGNOSIS — I10 Essential (primary) hypertension: Secondary | ICD-10-CM

## 2023-04-05 ENCOUNTER — Telehealth: Payer: Self-pay

## 2023-04-05 ENCOUNTER — Ambulatory Visit: Payer: 59

## 2023-04-05 DIAGNOSIS — Z0000 Encounter for general adult medical examination without abnormal findings: Secondary | ICD-10-CM

## 2023-04-05 NOTE — Telephone Encounter (Signed)
Called patient as scheduled for health coaching appointment. Patient stated that she's not feeling well and requested a callback on another day at 11:00am. Patient has been rescheduled for 8/22 at 11:00am. Patient will be called at this time.    Renaee Munda, MS, ERHD, [facility]  Care Guide, Health & Wellness Coach [address] [place] [address] Telephone: [phone] Email: [email or web address]

## 2023-04-06 NOTE — Telephone Encounter (Signed)
Request is too soon, last refill 02/18/23 for 100 and 2 refills and by another provider.  Requested Prescriptions  Pending Prescriptions Disp Refills   metoprolol succinate (TOPROL-XL) 25 MG 24 hr tablet [Pharmacy Med Name: METOPROLOL ER SUCCINATE 25MG  TABS] 90 tablet     Sig: TAKE 1 TABLET(25 MG) BY MOUTH DAILY WITH OR IMMEDIATELY FOLLOWING A MEAL     Cardiovascular:  Beta Blockers Failed - 04/04/2023 11:55 AM      Failed - Last BP in normal range    BP Readings from Last 1 Encounters:  03/14/23 (!) 146/83         Passed - Last Heart Rate in normal range    Pulse Readings from Last 1 Encounters:  03/14/23 (!) 58         Passed - Valid encounter within last 6 months    Recent Outpatient Visits           5 months ago Need for immunization against influenza   [place] [facility] [name], NP   1 year ago Essential hypertension   [place] [facility] [name], NP   1 year ago Hospital discharge follow-up   [place] [facility] [name], NP   2 years ago Hospital discharge follow-up   [place] [facility] [name], NP   3 years ago Hospital discharge follow-up   [place] [facility] [name], NP       Future Appointments             In 2 days Little Ishikawa, MD [facility]   In 2 weeks Randa Evens, Kinnie Scales, NP [facility]

## 2023-04-07 ENCOUNTER — Ambulatory Visit: Payer: 59 | Attending: Cardiovascular Disease

## 2023-04-07 ENCOUNTER — Telehealth: Payer: Self-pay

## 2023-04-07 DIAGNOSIS — Z0000 Encounter for general adult medical examination without abnormal findings: Secondary | ICD-10-CM

## 2023-04-07 NOTE — Telephone Encounter (Signed)
Called patient as scheduled for health coaching session. Patient did not answer. Was unable to leave a message.  Renaee Munda, MS, ERHD, [facility]  Care Guide, Health & Wellness Coach [address], [address] [place] [address] Telephone: [phone] Email: [email or web address]

## 2023-04-07 NOTE — Progress Notes (Deleted)
Cardiology Office Note:    Date:  04/07/2023   ID:  Veronica Stewart, DOB 18-Sep-1955, MRN [id number]  PCP:  [name], NP  Cardiologist:  Little Ishikawa, MD  Electrophysiologist:  None   Referring MD: [name], NP   No chief complaint on file.    History of Present Illness:    Veronica Stewart is a 67 y.o. female with a hx of atrial flutter, PAD status post stent pop bypass, hypertension, asthma who presents for follow-up.  She was admitted to [facility] on 02/11/2021 with epistaxis and new onset atrial flutter.  Anticoagulation was initially held due to her epistaxis and she was evaluated by ENT.  Underwent packing in epistaxis resolved, and she was started on Eliquis, which he tolerated well.  She had been on aspirin/Plavix for her PAD given her left external iliac stenting in August 2021.  She was transitioned to Eliquis monotherapy.  Echocardiogram 02/13/2020 showed EF 40 to 45%.  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  She completed adjuvant chemotherapy and radiation.  Echocardiogram 02/22/2022 showed EF 55 to 60%, normal diastolic function, normal RV function, RVSP 41 mmHg, mild MR, mild to moderate TR.  Zio patch was worn for 9 days 02/2022 the only had 1 day of analysis time, no significant arrhythmias were seen.   Since last clinic visit, she reports she has been doing well.  Denies any chest pain, dyspnea, lightheadedness, syncope, lower extremity edema, or palpitations.  Denies any bleeding on Eliquis.  She is smoking about 8 cigarettes/day.  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[facility])     Past Surgical History:  Procedure Laterality Date   ABDOMINAL  AORTOGRAM W/LOWER EXTREMITY Bilateral 03/05/2020   Procedure: ABDOMINAL AORTOGRAM W/LOWER EXTREMITY;  Surgeon: Cephus Shelling, MD;  Location: [facility] INVASIVE CV LAB;  Service: Cardiovascular;  Laterality: Bilateral;   ENDARTERECTOMY FEMORAL Left 03/24/2020   Procedure: LEFT COMMON FEMORAL ENDARTERECTOMY;  Surgeon: Cephus Shelling, MD;  Location: [facility] OR;  Service: Vascular;  Laterality: Left;   ENDOVEIN HARVEST OF GREATER SAPHENOUS VEIN Left 03/24/2020   Procedure: HARVEST OF GREATER SAPHENOUS VEIN;  Surgeon: Cephus Shelling, MD;  Location: [facility] OR;  Service: Vascular;  Laterality: Left;   FEMORAL-POPLITEAL BYPASS GRAFT Left 03/24/2020   Procedure: BYPASS GRAFT FEMORAL-POPLITEAL ARTERY;  Surgeon: Cephus Shelling, MD;  Location: [facility] OR;  Service: Vascular;  Laterality: Left;   INSERTION OF ILIAC STENT Left 03/24/2020   Procedure: INSERTION OF RETROGRADE ILIAC STENT;  Surgeon: Cephus Shelling, MD;  Location: [facility] OR;  Service: Vascular;  Laterality: Left;   INTRAMEDULLARY (IM) NAIL INTERTROCHANTERIC Left 03/18/2018   Procedure: INTRAMEDULLARY (IM) NAIL INTERTROCHANTRIC;  Surgeon: Roby Lofts, MD;  Location: [facility] OR;  Service: Orthopedics;  Laterality: Left;   INTRAOPERATIVE ARTERIOGRAM Left 03/24/2020   Procedure: INTRA OPERATIVE ARTERIOGRAM;  Surgeon: Cephus Shelling, MD;  Location: [facility] OR;  Service: Vascular;  Laterality: Left;    Current Medications: No outpatient medications have been marked as taking for the 04/08/23 encounter (Appointment) with Little Ishikawa, MD.   Current Facility-Administered Medications for the 04/08/23 encounter (Appointment) with Little Ishikawa, MD  Medication   albuterol (  PROVENTIL) (2.5 MG/3ML) 0.083% nebulizer solution 2.5 mg     Allergies:   Patient has no known allergies.   Social History   Socioeconomic History   Marital status: Legally Separated    Spouse name: Not on file   Number of children: Not on file   Years of education:  Not on file   Highest education level: Not on file  Occupational History   Not on file  Tobacco Use   Smoking status: Every Day    Current packs/day: 0.50    Types: Cigarettes   Smokeless tobacco: Never  Vaping Use   Vaping status: Never Used  Substance and Sexual Activity   Alcohol use: Yes    Comment: two 40 oz beers most days    Drug use: Not Currently    Types: Marijuana   Sexual activity: Not on file  Other Topics Concern   Not on file  Social History Narrative   Not on file   Social Determinants of Health   Financial Resource Strain: Not on file  Food Insecurity: Not on file  Transportation Needs: Not on file  Physical Activity: Not on file  Stress: Not on file  Social Connections: Not on file     Family History: [name] is negative for Sudden Cardiac Death.  ROS:   Please see the history of present illness.      All other systems reviewed and are negative.  EKGs/Labs/Other Studies Reviewed:    The following studies were reviewed today:   EKG:   03/03/2023: Sinus bradycardia, rate 58, poor R wave progression, no ST abnormalities, low voltage 03/23/22: Sinus rhythm, rate 60, first-degree AV block, poor R wave progression, Q waves in V1-3 02/09/2022: Sinus bradycardia, rate 55, Q waves in V1-3 05/11/21: Normal sinus rhythm, rate 73, Q waves V1-4 07/22:sinus rhythm, rate 63, Q-wave V1-V4  Recent Labs: 03/28/2023: ALT 12; BUN 15; Creatinine, Ser 1.57; Potassium 4.6; Sodium 136; TSH 2.250 04/01/2023: Hemoglobin 7.7; Platelets 319  Recent Lipid Panel    Component Value Date/Time   CHOL 89 03/25/2020 0216   CHOL 173 03/26/2019 1450   TRIG 31 03/25/2020 0216   HDL 44 03/25/2020 0216   HDL 102 03/26/2019 1450   CHOLHDL 2.0 03/25/2020 0216   VLDL 6 03/25/2020 0216   LDLCALC 39 03/25/2020 0216   LDLCALC 48 03/26/2019 1450    Physical Exam:    VS:  There were no vitals taken for this visit.    Wt Readings from Last 3 Encounters:   03/03/23 105 lb (47.6 kg)  12/13/22 (P) 108 lb (49 kg)  11/04/22 108 lb 3.2 oz (49.1 kg)     GEN: Cachectic, in no acute distress NECK: No JVD CARDIAC: RRR, no murmurs, rubs, gallops RESPIRATORY:  Clear to auscultation without rales, wheezing or rhonchi  ABDOMEN: Soft, non-tender, non-distended MUSCULOSKELETAL:  No edema; SKIN: Warm and dry NEUROLOGIC:  Alert and oriented x 3 PSYCHIATRIC:  Normal affect   ASSESSMENT:    No diagnosis found.    PLAN:    Iron deficiency anemia: Recent labs show hemoglobin down to 7.7, low iron/ferritin.  Will hold Eliquis.  Refer to hematology and GI.  Atrial Flutter: Noted during admission 01/2021, rates up to 140s.  CHADSVASc score 4 (CHF, HTN, PAD, female).  Echo 02/12/21 shows EF 40 to 45%.  Echocardiogram 02/22/2022 showed EF 55 to 60%, normal diastolic function, normal RV function, RVSP 41 mmHg, mild MR, mild to moderate TR.  Zio patch was  worn for 9 days 02/2022 the only had 1 day of analysis time, no significant arrhythmias were seen. -Given she was going in and out of atrial flutter during admission, was started on amiodarone to maintain sinus rhythm.  Continue sinus rhythm on amiodarone 200 mg daily.  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  Referred to EP to evaluate for ablation, recommended continuing amiodarone at this time -Continue Eliquis 5 mg twice daily -Continue Toprol-XL 25 mg daily   Chronic combined systolic and diastolic heart failure: Echo 02/12/2021 shows EF 40 to 45%.  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  Echocardiogram 02/22/2022 showed EF 55 to 60%, normal diastolic function, normal RV function, RVSP 41 mmHg, mild MR, mild to moderate TR. 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  Currently on amlodipine 10 mg daily, hydralazine 25 mg 2 times daily and Toprol-XL 25 mg daily   PAD s/p fem-pop bypass: with left external iliac stenting 8/21. Has been on ASA/plavix since that time.  During admission in June 2022, aspirin/Plavix were discontinued and switched to Eliquis monotherapy given her new atrial flutter as above.  Continue atorvastatin, hold Eliquis as above  Hyperlipidemia: LDL 65 on 08/18/2021.  Continue atorvastatin 20 mg daily.  Tobacco use: Counseled risk of tobacco use and cessation strongly encouraged.  Will ask out care coordinator to reach out to patient to aid in cessation  AKI: Creatinine up to 1.76 on 02/22/2022.  Likely due to lisinopril-HCTZ.  Medication discontinued, improvement in creatinine to 1.17 on 03/15/2022.  Most recent creatinine 1.57 on 03/28/2023   RTC in 6 months***   Medication Adjustments/Labs and Tests Ordered: Current medicines are reviewed at length with the patient today.  Concerns regarding medicines are outlined above.  No orders of the defined types were placed in this encounter.    No orders of the defined types were placed in this encounter.     There are no Patient Instructions on file for this visit.     Signed, Little Ishikawa, MD  04/07/2023 9:40 PM    [facility]

## 2023-04-08 ENCOUNTER — Ambulatory Visit: Payer: 59 | Admitting: Cardiology

## 2023-04-12 ENCOUNTER — Other Ambulatory Visit: Payer: Self-pay

## 2023-04-12 DIAGNOSIS — D649 Anemia, unspecified: Secondary | ICD-10-CM

## 2023-04-13 ENCOUNTER — Other Ambulatory Visit: Payer: Self-pay

## 2023-04-13 ENCOUNTER — Inpatient Hospital Stay: Payer: 59 | Attending: Internal Medicine | Admitting: Internal Medicine

## 2023-04-13 ENCOUNTER — Inpatient Hospital Stay: Payer: 59

## 2023-04-13 VITALS — BP 152/99 | HR 65 | Temp 97.5°F | Resp 15 | Ht 69.0 in | Wt 102.0 lb

## 2023-04-13 DIAGNOSIS — F17200 Nicotine dependence, unspecified, uncomplicated: Secondary | ICD-10-CM | POA: Diagnosis not present

## 2023-04-13 DIAGNOSIS — Z79899 Other long term (current) drug therapy: Secondary | ICD-10-CM | POA: Insufficient documentation

## 2023-04-13 DIAGNOSIS — D509 Iron deficiency anemia, unspecified: Secondary | ICD-10-CM | POA: Diagnosis not present

## 2023-04-13 DIAGNOSIS — I10 Essential (primary) hypertension: Secondary | ICD-10-CM | POA: Diagnosis not present

## 2023-04-13 DIAGNOSIS — D500 Iron deficiency anemia secondary to blood loss (chronic): Secondary | ICD-10-CM

## 2023-04-13 DIAGNOSIS — Z923 Personal history of irradiation: Secondary | ICD-10-CM | POA: Diagnosis not present

## 2023-04-13 DIAGNOSIS — I4891 Unspecified atrial fibrillation: Secondary | ICD-10-CM

## 2023-04-13 DIAGNOSIS — C300 Malignant neoplasm of nasal cavity: Secondary | ICD-10-CM | POA: Insufficient documentation

## 2023-04-13 DIAGNOSIS — D649 Anemia, unspecified: Secondary | ICD-10-CM

## 2023-04-13 DIAGNOSIS — Z809 Family history of malignant neoplasm, unspecified: Secondary | ICD-10-CM

## 2023-04-13 LAB — CBC WITH DIFFERENTIAL/PLATELET
Abs Immature Granulocytes: 0.01 10*3/uL (ref 0.00–0.07)
Basophils Absolute: 0 10*3/uL (ref 0.0–0.1)
Basophils Relative: 1 %
Eosinophils Absolute: 0.2 10*3/uL (ref 0.0–0.5)
Eosinophils Relative: 3 %
HCT: 25.3 % — ABNORMAL LOW (ref 36.0–46.0)
Hemoglobin: 7 g/dL — ABNORMAL LOW (ref 12.0–15.0)
Immature Granulocytes: 0 %
Lymphocytes Relative: 18 %
Lymphs Abs: 0.8 10*3/uL (ref 0.7–4.0)
MCH: 17.3 pg — ABNORMAL LOW (ref 26.0–34.0)
MCHC: 27.7 g/dL — ABNORMAL LOW (ref 30.0–36.0)
MCV: 62.5 fL — ABNORMAL LOW (ref 80.0–100.0)
Monocytes Absolute: 0.5 10*3/uL (ref 0.1–1.0)
Monocytes Relative: 12 %
Neutro Abs: 3.1 10*3/uL (ref 1.7–7.7)
Neutrophils Relative %: 66 %
Platelets: 271 10*3/uL (ref 150–400)
RBC: 4.05 MIL/uL (ref 3.87–5.11)
RDW: 22.4 % — ABNORMAL HIGH (ref 11.5–15.5)
WBC: 4.7 10*3/uL (ref 4.0–10.5)
nRBC: 0 % (ref 0.0–0.2)

## 2023-04-13 LAB — CMP (CANCER CENTER ONLY)
ALT: 10 U/L (ref 0–44)
AST: 18 U/L (ref 15–41)
Albumin: 4.2 g/dL (ref 3.5–5.0)
Alkaline Phosphatase: 51 U/L (ref 38–126)
Anion gap: 7 (ref 5–15)
BUN: 18 mg/dL (ref 8–23)
CO2: 27 mmol/L (ref 22–32)
Calcium: 9.2 mg/dL (ref 8.9–10.3)
Chloride: 103 mmol/L (ref 98–111)
Creatinine: 1.41 mg/dL — ABNORMAL HIGH (ref 0.44–1.00)
GFR, Estimated: 41 mL/min — ABNORMAL LOW (ref >60)
Glucose, Bld: 80 mg/dL (ref 70–99)
Potassium: 4 mmol/L (ref 3.5–5.1)
Sodium: 137 mmol/L (ref 135–145)
Total Bilirubin: 0.4 mg/dL (ref 0.3–1.2)
Total Protein: 7.9 g/dL (ref 6.5–8.1)

## 2023-04-13 LAB — SAMPLE TO BLOOD BANK

## 2023-04-13 LAB — VITAMIN B12: Vitamin B-12: 217 pg/mL (ref 180–914)

## 2023-04-13 LAB — FOLATE: Folate: 17.3 ng/mL (ref >5.9)

## 2023-04-13 LAB — FERRITIN: Ferritin: 4 ng/mL — ABNORMAL LOW (ref 11–307)

## 2023-04-13 NOTE — Progress Notes (Signed)
[facility] [phone]   [phone]  CONSULT NOTE  REFERRING PHYSICIAN: Gwinda Passe, NP  REASON FOR CONSULTATION:  67 years old African-American female with persistent anemia.  HPI Veronica Stewart is a 67 y.o. 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  The patient was seen by her primary care provider recently for routine evaluation.  She had repeat CBC on 04/01/2023 and that showed persistent anemia with hemoglobin of 7.7 and hematocrit 29.3% with MCV of 64.  Iron study showed low serum iron of 14 with iron saturation of 3% and ferritin level of 7.  She had similar results in the past and had anemia has been going on for few years now.  She was seen at [facility] by Dr. 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  She had surgical resection in addition to adjuvant chemoradiation. The patient was referred to me today for evaluation and recommendation regarding her persistent iron deficiency anemia.  I do not see any recent record of GI workup.  The patient denied having any screening colonoscopy in the past.  She has no current bleeding, bruises or ecchymosis.  When seen today she mentions that she feels all right with no significant fatigue or weakness.  She has no dizzy spells.  She has no significant weight loss or night sweats.  She has craving for ice.  She has no chest pain, shortness of breath, cough or hemoptysis. Family history significant for father who died from cancer of unknown type.  Mother had diabetes and hypertension. The patient is separated and has no children.  She used to work in a [employer].  She has a history of smoking around 1 pack/day for around 55 years and unfortunately she is still smoking.  She also drinks 2 cans of beer every day.   She denied having any history of drug abuse.  HPI  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[facility])     Past Surgical History:  Procedure Laterality Date   ABDOMINAL AORTOGRAM W/LOWER EXTREMITY Bilateral 03/05/2020   Procedure: ABDOMINAL AORTOGRAM W/LOWER EXTREMITY;  Surgeon: Cephus Shelling, MD;  Location: [facility] INVASIVE CV LAB;  Service: Cardiovascular;  Laterality: Bilateral;   ENDARTERECTOMY FEMORAL Left 03/24/2020   Procedure: LEFT COMMON FEMORAL ENDARTERECTOMY;  Surgeon: Cephus Shelling, MD;  Location: [facility] OR;  Service: Vascular;  Laterality: Left;   ENDOVEIN HARVEST OF GREATER SAPHENOUS VEIN Left 03/24/2020   Procedure: HARVEST OF GREATER SAPHENOUS VEIN;  Surgeon: Cephus Shelling, MD;  Location: [facility] OR;  Service: Vascular;  Laterality: Left;   FEMORAL-POPLITEAL BYPASS GRAFT Left 03/24/2020   Procedure: BYPASS GRAFT FEMORAL-POPLITEAL ARTERY;  Surgeon: Cephus Shelling, MD;  Location: [facility] OR;  Service: Vascular;  Laterality: Left;   INSERTION OF ILIAC STENT Left 03/24/2020   Procedure: INSERTION OF RETROGRADE ILIAC STENT;  Surgeon: Cephus Shelling, MD;  Location: [facility] OR;  Service: Vascular;  Laterality: Left;   INTRAMEDULLARY (IM) NAIL INTERTROCHANTERIC Left 03/18/2018   Procedure: INTRAMEDULLARY (IM) NAIL INTERTROCHANTRIC;  Surgeon: Roby Lofts, MD;  Location: [facility] OR;  Service: Orthopedics;  Laterality: Left;   INTRAOPERATIVE ARTERIOGRAM Left 03/24/2020  Procedure: INTRA OPERATIVE ARTERIOGRAM;  Surgeon: Cephus Shelling, MD;  Location: [facility] OR;  Service: Vascular;  Laterality: Left;    Family History  Problem Relation Age of Onset   Sudden Cardiac Death Neg Hx     Social History Social History   Tobacco Use   Smoking status: Every Day    Current packs/day: 0.50    Types: Cigarettes   Smokeless tobacco: Never   Vaping Use   Vaping status: Never Used  Substance Use Topics   Alcohol use: Yes    Comment: two 40 oz beers most days    Drug use: Not Currently    Types: Marijuana    No Known Allergies  Current Outpatient Medications  Medication Sig Dispense Refill   acetaminophen (TYLENOL) 500 MG tablet Take 500 mg by mouth every 6 (six) hours as needed for mild pain.     albuterol (VENTOLIN HFA) 108 (90 Base) MCG/ACT inhaler INHALE 2 PUFFS BY MOUTH EVERY 6 HOURS AS NEEDED FOR WHEEZING AND SHORTNESS OF BREATH 8.5 g 2   amiodarone (PACERONE) 200 MG tablet TAKE 1 TABLET BY MOUTH DAILY 100 tablet 2   amLODipine (NORVASC) 5 MG tablet TAKE 1 TABLET BY MOUTH DAILY 100 tablet 2   aspirin EC 81 MG tablet Take 81 mg by mouth daily. Swallow whole.     atorvastatin (LIPITOR) 20 MG tablet Take 1 tablet (20 mg total) by mouth daily. 90 tablet 3   DEEP SEA NASAL SPRAY 0.65 % nasal spray SMARTSIG:Both Nares     feeding supplement (ENSURE ENLIVE / ENSURE PLUS) LIQD Take 237 mLs by mouth 3 (three) times daily between meals. 237 mL 12   fluticasone (FLONASE) 50 MCG/ACT nasal spray SHAKE LIQUID AND USE 2 SPRAYS IN EACH NOSTRIL DAILY 48 g 0   Fluticasone-Salmeterol (ADVAIR) 100-50 MCG/DOSE AEPB Inhale 1 puff into the lungs 2 (two) times daily. 1 each 3   hydrALAZINE (APRESOLINE) 25 MG tablet TAKE 1 TABLET BY MOUTH TWICE  DAILY 200 tablet 2   loratadine (CLARITIN) 10 MG tablet TAKE 1 TABLET(10 MG) BY MOUTH DAILY 30 tablet 11   metoprolol succinate (TOPROL-XL) 25 MG 24 hr tablet TAKE 1 TABLET BY MOUTH DAILY  WITH A MEAL OR IMMEDIATELY AFTER THE SAME MEAL 100 tablet 2   ondansetron (ZOFRAN) 8 MG tablet Take 8 mg by mouth 3 (three) times daily.     pantoprazole (PROTONIX) 40 MG tablet TAKE 1 TABLET(40 MG) BY MOUTH TWICE DAILY 180 tablet 0   sodium chloride 1 g tablet Take 1 g by mouth 2 (two) times daily.     Current Facility-Administered Medications  Medication Dose Route Frequency Provider Last Rate Last Admin    albuterol (PROVENTIL) (2.5 MG/3ML) 0.083% nebulizer solution 2.5 mg  2.5 mg Nebulization Once [name], NP        Review of Systems  Constitutional: negative Eyes: negative Ears, nose, mouth, throat, and face: negative Respiratory: negative Cardiovascular: negative Gastrointestinal: negative Genitourinary:negative Integument/breast: negative Hematologic/lymphatic: negative Musculoskeletal:negative Neurological: negative Behavioral/Psych: negative Endocrine: negative Allergic/Immunologic: negative  Physical Exam  ZOX:WRUEA, healthy, no distress, and pale SKIN: skin color, texture, turgor are normal, no rashes or significant lesions HEAD: Normocephalic, No masses, lesions, tenderness or abnormalities, post surgical changes on the right side of the face EYES: normal, PERRLA, Conjunctiva are pink and non-injected EARS: External ears normal, Canals clear OROPHARYNX:no exudate, no erythema, and lips, buccal mucosa, and tongue normal  NECK: supple, no adenopathy, no JVD LYMPH:  no palpable lymphadenopathy,  no hepatosplenomegaly BREAST:not examined LUNGS: clear to auscultation , and palpation HEART: regular rate & rhythm, no murmurs, and no gallops ABDOMEN:abdomen soft, non-tender, normal bowel sounds, and no masses or organomegaly BACK: Back symmetric, no curvature., No CVA tenderness EXTREMITIES:no joint deformities, effusion, or inflammation, no edema  NEURO: alert & oriented x 3 with fluent speech, no focal motor/sensory deficits  PERFORMANCE STATUS: ECOG 0  LABORATORY DATA: Lab Results  Component Value Date   WBC 5.4 04/01/2023   HGB 7.7 (L) 04/01/2023   HCT 29.3 (L) 04/01/2023   MCV 64 (L) 04/01/2023   PLT 319 04/01/2023      Chemistry      Component Value Date/Time   NA 136 03/28/2023 1629   K 4.6 03/28/2023 1629   CL 98 03/28/2023 1629   CO2 24 03/28/2023 1629   BUN 15 03/28/2023 1629   CREATININE 1.57 (H) 03/28/2023 1629      Component Value  Date/Time   CALCIUM 9.2 03/28/2023 1629   ALKPHOS 64 03/28/2023 1629   AST 25 03/28/2023 1629   ALT 12 03/28/2023 1629   BILITOT 0.4 03/28/2023 1629       RADIOGRAPHIC STUDIES: ECHOCARDIOGRAM COMPLETE  Result Date: 03/28/2023    ECHOCARDIOGRAM REPORT   Patient Name:   Veronica Stewart Date of Exam: 03/28/2023 Medical Rec #:  [id number]       Height:       69.0 in Accession #:    [id number]      Weight:       105.0 lb Date of Birth:  [birth year]       BSA:          1.571 m Patient Age:    67 years        BP:           134/88 mmHg Patient Gender: F               HR:           64 bpm. Exam Location:  [address] Procedure: 2D Echo, Cardiac Doppler, Color Doppler and Strain Analysis Indications:    I50.42 CHF  History:        Patient has prior history of Echocardiogram examinations, most                 recent 02/22/2022. CHF, Arrythmias:Atrial Flutter; Risk                 Factors:Hypertension and Current Smoker.  Sonographer:    Samule Ohm RDCS Referring Phys: [id number] CHRISTOPHER L SCHUMANN IMPRESSIONS  1. Left ventricular ejection fraction, by estimation, is 60 to 65%. The left ventricle has normal function. The left ventricle has no regional wall motion abnormalities. Left ventricular diastolic parameters were normal. The average left ventricular global longitudinal strain is 20.9 %. The global longitudinal strain is normal.  2. Right ventricular systolic function is normal. The right ventricular size is normal. There is moderately elevated pulmonary artery systolic pressure. The estimated right ventricular systolic pressure is 58.1 mmHg.  3. Left atrial size was moderately dilated.  4. Right atrial size was moderately dilated.  5. The mitral valve is normal in structure. Mild mitral valve regurgitation. No evidence of mitral stenosis.  6. Tricuspid valve regurgitation is moderate to severe.  7. The aortic valve is tricuspid. Aortic valve regurgitation is not visualized. No aortic stenosis is present.   8. The inferior vena cava is normal in size with greater than 50% respiratory variability, suggesting right  atrial pressure of 3 mmHg. Comparison(s): Prior images reviewed side by side. The left ventricular function is unchanged. The right ventricular hypertrophy is unchanged. Tricuspid insufficiency is worse and the estimated PA pressure is higher. FINDINGS  Left Ventricle: Left ventricular ejection fraction, by estimation, is 60 to 65%. The left ventricle has normal function. The left ventricle has no regional wall motion abnormalities. The average left ventricular global longitudinal strain is 20.9 %. The  global longitudinal strain is normal. The left ventricular internal cavity size was normal in size. There is no left ventricular hypertrophy. Left ventricular diastolic parameters were normal. Right Ventricle: The right ventricular size is normal. No increase in right ventricular wall thickness. Right ventricular systolic function is normal. There is moderately elevated pulmonary artery systolic pressure. The tricuspid regurgitant velocity is 3.71 m/s, and with an assumed right atrial pressure of 3 mmHg, the estimated right ventricular systolic pressure is 58.1 mmHg. Left Atrium: Left atrial size was moderately dilated. Right Atrium: Right atrial size was moderately dilated. Pericardium: There is no evidence of pericardial effusion. Mitral Valve: The mitral valve is normal in structure. Mild mitral valve regurgitation. No evidence of mitral valve stenosis. Tricuspid Valve: The tricuspid valve is normal in structure. Tricuspid valve regurgitation is moderate to severe. No evidence of tricuspid stenosis. Aortic Valve: The aortic valve is tricuspid. Aortic valve regurgitation is not visualized. No aortic stenosis is present. Pulmonic Valve: The pulmonic valve was normal in structure. Pulmonic valve regurgitation is not visualized. No evidence of pulmonic stenosis. Aorta: The aortic root is normal in size and  structure. Venous: The inferior vena cava is normal in size with greater than 50% respiratory variability, suggesting right atrial pressure of 3 mmHg. IAS/Shunts: No atrial level shunt detected by color flow Doppler.  LEFT VENTRICLE PLAX 2D LVIDd:         4.00 cm   Diastology LVIDs:         2.50 cm   LV e' medial:    11.40 cm/s LV PW:         1.00 cm   LV E/e' medial:  8.7 LV IVS:        1.10 cm   LV e' lateral:   14.40 cm/s LVOT diam:     2.00 cm   LV E/e' lateral: 6.9 LV SV:         62 LV SV Index:   40        2D Longitudinal Strain LVOT Area:     3.14 cm  2D Strain GLS (A2C):   20.6 %                          2D Strain GLS (A3C):   20.6 %                          2D Strain GLS (A4C):   21.6 %                          2D Strain GLS Avg:     20.9 % RIGHT VENTRICLE             IVC RV S prime:     14.90 cm/s  IVC diam: 0.70 cm TAPSE (M-mode): 2.6 cm RVSP:           58.1 mmHg LEFT ATRIUM  Index        RIGHT ATRIUM           Index LA diam:        3.40 cm 2.16 cm/m   RA Pressure: 3.00 mmHg LA Vol (A2C):   54.8 ml 34.88 ml/m  RA Area:     23.60 cm LA Vol (A4C):   51.9 ml 33.03 ml/m  RA Volume:   82.30 ml  52.38 ml/m LA Biplane Vol: 56.3 ml 35.84 ml/m  AORTIC VALVE LVOT Vmax:   83.00 cm/s LVOT Vmean:  56.800 cm/s LVOT VTI:    0.198 m  AORTA Ao Root diam: 3.40 cm Ao Asc diam:  3.30 cm MITRAL VALVE               TRICUSPID VALVE MV Area (PHT): 3.79 cm    TR Peak grad:   55.1 mmHg MV Decel Time: 200 msec    TR Vmax:        371.00 cm/s MV E velocity: 99.60 cm/s  Estimated RAP:  3.00 mmHg MV A velocity: 90.70 cm/s  RVSP:           58.1 mmHg MV E/A ratio:  1.10                            SHUNTS                            Systemic VTI:  0.20 m                            Systemic Diam: 2.00 cm Rachelle Hora Croitoru MD Electronically signed by Thurmon Fair MD Signature Date/Time: 03/28/2023/3:53:13 PM    Final     ASSESSMENT: This is a 67 years old African-American female with history of nasal squamous cell carcinoma  status post resection followed by concurrent chemoradiation [facility]. The patient presented for evaluation of persistent anemia that has been going on for few years.  This is likely iron deficiency anemia from gastrointestinal blood loss.  The patient had no previous gastrointestinal workup or screening colonoscopy.   PLAN: I had a lengthy discussion with the patient today about her current condition and treatment options. I ordered several studies including repeat CBC that showed hemoglobin of 7.0 and hematocrit 25.3 with MCV of 62.5.  She has normal total white blood count as well as platelet count.  Comprehensive metabolic panel is unremarkable except for elevated serum creatinine of 1.41 Iron study, ferritin, serum folate, vitamin B12, serum protein electrophoresis are still pending. Based on the previous iron study performed less than 2 weeks ago I strongly recommend for the patient to proceed with iron infusion with Venofer 300 Mg IV weekly for 3 weeks at the [facility] to start next week. I will also refer the patient to gastroenterology for consideration of colonoscopy and upper endoscopy if needed to rule out any underlying gastrointestinal bleeding etiology. I will arrange for the patient to receive 1 unit of PRBCs transfusion in the next few days. I will see her back for follow-up visit in 3 months for evaluation and repeat blood work. The patient was advised to call immediately if she has any other concerning symptoms in the interval. The patient voices understanding of current disease status and treatment options and is in agreement with  the current care plan.  All questions were answered. The patient knows to call the clinic with any problems, questions or concerns. We can certainly see the patient much sooner if necessary.  Thank you so much for allowing me to participate in the care of [facility]. I will continue to  follow up the patient with you and assist in her care.  The total time spent in the appointment was 60 minutes.  Disclaimer: This note was dictated with voice recognition software. Similar sounding words can inadvertently be transcribed and may not be corrected upon review.   Lajuana Matte April 13, 2023, 11:11 AM

## 2023-04-14 ENCOUNTER — Other Ambulatory Visit: Payer: Self-pay

## 2023-04-14 DIAGNOSIS — D649 Anemia, unspecified: Secondary | ICD-10-CM

## 2023-04-14 LAB — IRON AND IRON BINDING CAPACITY (CC-WL,HP ONLY)
Iron: 13 ug/dL — ABNORMAL LOW (ref 28–170)
Saturation Ratios: 3 % — ABNORMAL LOW (ref 10.4–31.8)
TIBC: 487 ug/dL — ABNORMAL HIGH (ref 250–450)
UIBC: 474 ug/dL

## 2023-04-15 ENCOUNTER — Telehealth: Payer: Self-pay

## 2023-04-15 LAB — PROTEIN ELECTROPHORESIS, SERUM, WITH REFLEX
A/G Ratio: 1 (ref 0.7–1.7)
Albumin ELP: 3.9 g/dL (ref 2.9–4.4)
Alpha-1-Globulin: 0.3 g/dL (ref 0.0–0.4)
Alpha-2-Globulin: 0.8 g/dL (ref 0.4–1.0)
Beta Globulin: 1.1 g/dL (ref 0.7–1.3)
Gamma Globulin: 1.7 g/dL (ref 0.4–1.8)
Globulin, Total: 3.8 g/dL (ref 2.2–3.9)
Total Protein ELP: 7.7 g/dL (ref 6.0–8.5)

## 2023-04-15 NOTE — Telephone Encounter (Signed)
This nurse attempted times two to reach this patient related to message left with Endocrinology.  Patient states that she is unable to make it on Wednesday 9/4 for her blood transfusion and needs to move the appointment to Thursday.  The phone is giving a busy signal.  This patient does have transportation issues.  Will attempted to reach patient for scheduling.

## 2023-04-19 ENCOUNTER — Other Ambulatory Visit: Payer: Self-pay | Admitting: Medical Oncology

## 2023-04-19 DIAGNOSIS — D500 Iron deficiency anemia secondary to blood loss (chronic): Secondary | ICD-10-CM

## 2023-04-20 ENCOUNTER — Telehealth: Payer: Self-pay | Admitting: Medical Oncology

## 2023-04-20 ENCOUNTER — Other Ambulatory Visit: Payer: Self-pay | Admitting: Medical Oncology

## 2023-04-20 ENCOUNTER — Inpatient Hospital Stay: Payer: 59

## 2023-04-20 ENCOUNTER — Other Ambulatory Visit: Payer: Self-pay | Admitting: Internal Medicine

## 2023-04-20 ENCOUNTER — Telehealth: Payer: Self-pay | Admitting: Pharmacy Technician

## 2023-04-20 ENCOUNTER — Inpatient Hospital Stay: Payer: 59 | Attending: Internal Medicine

## 2023-04-20 VITALS — BP 163/94 | HR 60 | Temp 97.5°F | Resp 16

## 2023-04-20 DIAGNOSIS — D500 Iron deficiency anemia secondary to blood loss (chronic): Secondary | ICD-10-CM

## 2023-04-20 DIAGNOSIS — D649 Anemia, unspecified: Secondary | ICD-10-CM

## 2023-04-20 LAB — CBC WITH DIFFERENTIAL (CANCER CENTER ONLY)
Abs Immature Granulocytes: 0.01 10*3/uL (ref 0.00–0.07)
Basophils Absolute: 0.1 10*3/uL (ref 0.0–0.1)
Basophils Relative: 1 %
Eosinophils Absolute: 0.3 10*3/uL (ref 0.0–0.5)
Eosinophils Relative: 5 %
HCT: 26.3 % — ABNORMAL LOW (ref 36.0–46.0)
Hemoglobin: 7.3 g/dL — ABNORMAL LOW (ref 12.0–15.0)
Immature Granulocytes: 0 %
Lymphocytes Relative: 18 %
Lymphs Abs: 0.9 10*3/uL (ref 0.7–4.0)
MCH: 17.1 pg — ABNORMAL LOW (ref 26.0–34.0)
MCHC: 27.8 g/dL — ABNORMAL LOW (ref 30.0–36.0)
MCV: 61.7 fL — ABNORMAL LOW (ref 80.0–100.0)
Monocytes Absolute: 0.7 10*3/uL (ref 0.1–1.0)
Monocytes Relative: 14 %
Neutro Abs: 3.1 10*3/uL (ref 1.7–7.7)
Neutrophils Relative %: 62 %
Platelet Count: 343 10*3/uL (ref 150–400)
RBC: 4.26 MIL/uL (ref 3.87–5.11)
RDW: 22.8 % — ABNORMAL HIGH (ref 11.5–15.5)
WBC Count: 5 10*3/uL (ref 4.0–10.5)
nRBC: 0 % (ref 0.0–0.2)

## 2023-04-20 LAB — PREPARE RBC (CROSSMATCH)

## 2023-04-20 MED ORDER — SODIUM CHLORIDE 0.9% IV SOLUTION
250.0000 mL | Freq: Once | INTRAVENOUS | Status: AC
  Administered 2023-04-20: 250 mL via INTRAVENOUS

## 2023-04-20 MED ORDER — DIPHENHYDRAMINE HCL 25 MG PO CAPS
25.0000 mg | ORAL_CAPSULE | Freq: Once | ORAL | Status: AC
  Administered 2023-04-20: 25 mg via ORAL
  Filled 2023-04-20: qty 1

## 2023-04-20 MED ORDER — ACETAMINOPHEN 325 MG PO TABS
650.0000 mg | ORAL_TABLET | Freq: Once | ORAL | Status: AC
  Administered 2023-04-20: 650 mg via ORAL
  Filled 2023-04-20: qty 2

## 2023-04-20 NOTE — Telephone Encounter (Signed)
Auth Submission: NO AUTH NEEDED Site of care: Site of care: [facility] Payer: [employer] DUAL Medication & CPT/J Code(s) submitted: Venofer (Iron Sucrose) J1756 Route of submission (phone, fax, portal):  Phone # Fax # Auth type: Buy/Bill PB Units/visits requested: X3 Reference number:  Approval from: 04/20/23 to 08/16/23

## 2023-04-20 NOTE — Patient Instructions (Signed)

## 2023-04-20 NOTE — Telephone Encounter (Signed)
[facility] has orders for [employer] and waiting on authorization from insurance. They will call her once it is auth.

## 2023-04-21 LAB — TYPE AND SCREEN
ABO/RH(D): O POS
Antibody Screen: NEGATIVE
Unit division: 0

## 2023-04-21 LAB — BPAM RBC
Blood Product Expiration Date: 202410022359
ISSUE DATE / TIME: 202409041051
Unit Type and Rh: 5100

## 2023-04-26 ENCOUNTER — Ambulatory Visit (INDEPENDENT_AMBULATORY_CARE_PROVIDER_SITE_OTHER): Payer: 59 | Admitting: Primary Care

## 2023-04-26 ENCOUNTER — Other Ambulatory Visit (INDEPENDENT_AMBULATORY_CARE_PROVIDER_SITE_OTHER): Payer: Self-pay | Admitting: Primary Care

## 2023-04-26 ENCOUNTER — Encounter (INDEPENDENT_AMBULATORY_CARE_PROVIDER_SITE_OTHER): Payer: Self-pay | Admitting: Primary Care

## 2023-04-26 VITALS — BP 153/90 | HR 65 | Resp 16 | Wt 106.2 lb

## 2023-04-26 DIAGNOSIS — I10 Essential (primary) hypertension: Secondary | ICD-10-CM | POA: Diagnosis not present

## 2023-04-26 DIAGNOSIS — J301 Allergic rhinitis due to pollen: Secondary | ICD-10-CM

## 2023-04-26 MED ORDER — LORATADINE 10 MG PO TABS
10.0000 mg | ORAL_TABLET | Freq: Every day | ORAL | 1 refills | Status: DC | PRN
Start: 2023-04-26 — End: 2023-06-27

## 2023-04-26 MED ORDER — AMLODIPINE BESYLATE 10 MG PO TABS: 10.0000 mg | ORAL_TABLET | Freq: Every day | ORAL | 1 refills | Status: DC

## 2023-04-26 NOTE — Patient Instructions (Signed)
Per Dr Bjorn Pippin, Cristal Deer increase amlodipine to 10mg  daily. You may take 2 (5mg ) until that bottle is complete

## 2023-05-01 NOTE — Progress Notes (Signed)
[facility]   Veronica Stewart is a 67 y.o. female presents for hypertension evaluation, Denies shortness of breath,  chest pain or lower extremity edema, sudden onset, vision changes, unilateral weakness, dizziness, paresthesias . She c/o of watery eyes, headache  Patient reports adherence with medications.  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)    Past Surgical History:  Procedure Laterality Date   ABDOMINAL AORTOGRAM W/LOWER EXTREMITY Bilateral 03/05/2020   Procedure: ABDOMINAL AORTOGRAM W/LOWER EXTREMITY;  Surgeon: Cephus Shelling, MD;  Location: [facility];  Service: Cardiovascular;  Laterality: Bilateral;   ENDARTERECTOMY FEMORAL Left 03/24/2020   Procedure: LEFT COMMON FEMORAL ENDARTERECTOMY;  Surgeon: Cephus Shelling, MD;  Location: [facility] OR;  Service: Vascular;  Laterality: Left;   ENDOVEIN HARVEST OF GREATER SAPHENOUS VEIN Left 03/24/2020   Procedure: HARVEST OF GREATER SAPHENOUS VEIN;  Surgeon: Cephus Shelling, MD;  Location: [facility] OR;  Service: Vascular;  Laterality: Left;   FEMORAL-POPLITEAL BYPASS GRAFT Left 03/24/2020   Procedure: BYPASS GRAFT FEMORAL-POPLITEAL ARTERY;  Surgeon: Cephus Shelling, MD;  Location: [facility] OR;  Service: Vascular;  Laterality: Left;   INSERTION OF ILIAC STENT Left 03/24/2020   Procedure: INSERTION OF RETROGRADE ILIAC STENT;  Surgeon: Cephus Shelling, MD;  Location: [facility] OR;  Service: Vascular;  Laterality: Left;   INTRAMEDULLARY (IM) NAIL INTERTROCHANTERIC Left 03/18/2018   Procedure: INTRAMEDULLARY (IM) NAIL INTERTROCHANTRIC;  Surgeon: Roby Lofts, MD;  Location: [facility] OR;  Service: Orthopedics;  Laterality: Left;   INTRAOPERATIVE ARTERIOGRAM Left  03/24/2020   Procedure: INTRA OPERATIVE ARTERIOGRAM;  Surgeon: Cephus Shelling, MD;  Location: [facility] OR;  Service: Vascular;  Laterality: Left;   No Known Allergies Current Outpatient Medications on File Prior to Visit  Medication Sig Dispense Refill   acetaminophen (TYLENOL) 500 MG tablet Take 500 mg by mouth every 6 (six) hours as needed for mild pain.     albuterol (VENTOLIN HFA) 108 (90 Base) MCG/ACT inhaler INHALE 2 PUFFS BY MOUTH EVERY 6 HOURS AS NEEDED FOR WHEEZING AND SHORTNESS OF BREATH 8.5 g 2   amiodarone (PACERONE) 200 MG tablet TAKE 1 TABLET BY MOUTH DAILY 100 tablet 2   aspirin EC 81 MG tablet Take 81 mg by mouth daily. Swallow whole.     atorvastatin (LIPITOR) 20 MG tablet Take 1 tablet (20 mg total) by mouth daily. 90 tablet 3   DEEP SEA NASAL SPRAY 0.65 % nasal spray SMARTSIG:Both Nares     feeding supplement (ENSURE ENLIVE / ENSURE PLUS) LIQD Take 237 mLs by mouth 3 (three) times daily between meals. 237 mL 12   fluticasone (FLONASE) 50 MCG/ACT nasal spray SHAKE LIQUID AND USE 2 SPRAYS IN EACH NOSTRIL DAILY 48 g 0   Fluticasone-Salmeterol (ADVAIR) 100-50 MCG/DOSE AEPB Inhale 1 puff into the lungs 2 (two) times daily. 1 each 3   hydrALAZINE (APRESOLINE) 25 MG tablet TAKE 1 TABLET BY MOUTH TWICE  DAILY 200 tablet 2   metoprolol succinate (TOPROL-XL) 25 MG 24 hr tablet TAKE 1 TABLET BY MOUTH DAILY  WITH A MEAL OR IMMEDIATELY AFTER THE SAME MEAL 100 tablet 2   ondansetron (ZOFRAN) 8 MG tablet Take 8 mg by mouth 3 (three) times daily.  pantoprazole (PROTONIX) 40 MG tablet TAKE 1 TABLET(40 MG) BY MOUTH TWICE DAILY 180 tablet 0   sodium chloride 1 g tablet Take 1 g by mouth 2 (two) times daily.     Current Facility-Administered Medications on File Prior to Visit  Medication Dose Route Frequency Provider Last Rate Last Admin   albuterol (PROVENTIL) (2.5 MG/3ML) 0.083% nebulizer solution 2.5 mg  2.5 mg Nebulization Once [name], NP       Social History    Socioeconomic History   Marital status: Legally Separated    Spouse name: Not on file   Number of children: Not on file   Years of education: Not on file   Highest education level: Not on file  Occupational History   Not on file  Tobacco Use   Smoking status: Every Day    Current packs/day: 0.50    Types: Cigarettes   Smokeless tobacco: Never  Vaping Use   Vaping status: Never Used  Substance and Sexual Activity   Alcohol use: Yes    Comment: two 40 oz beers most days    Drug use: Not Currently    Types: Marijuana   Sexual activity: Not on file  Other Topics Concern   Not on file  Social History Narrative   Not on file   Social Determinants of Health   Financial Resource Strain: Not on file  Food Insecurity: Not on file  Transportation Needs: Not on file  Physical Activity: Not on file  Stress: Not on file  Social Connections: Not on file  Intimate Partner Violence: Not on file   Family History  Problem Relation Age of Onset   Sudden Cardiac Death Neg Hx      OBJECTIVE:  Vitals:   04/26/23 1101  BP: (!) 153/90  Pulse: 65  Resp: 16  SpO2: 99%  Weight: 106 lb 3.2 oz (48.2 kg)    Physical Exam Vitals reviewed.  Constitutional:      Comments: underweight  HENT:     Head: Normocephalic.     Right Ear: Tympanic membrane and external ear normal.     Left Ear: Tympanic membrane and external ear normal.  Eyes:     Extraocular Movements: Extraocular movements intact.  Cardiovascular:     Rate and Rhythm: Normal rate and regular rhythm.  Pulmonary:     Effort: Pulmonary effort is normal.     Breath sounds: Normal breath sounds.  Abdominal:     General: Abdomen is flat.     Palpations: Abdomen is soft.  Musculoskeletal:        General: Normal range of motion.     Cervical back: Normal range of motion.  Skin:    General: Skin is warm and dry.  Neurological:     General: No focal deficit present.     Mental Status: She is alert and oriented to  person, place, and time.  Psychiatric:        Mood and Affect: Mood normal.        Behavior: Behavior normal.    ROS Comprehensive ROS Pertinent positive and negative noted in HPI    Last 3 Office BP readings: BP Readings from Last 3 Encounters:  04/26/23 (!) 153/90  04/20/23 (!) 163/94  04/13/23 (!) 152/99    BMET    Component Value Date/Time   NA 137 04/13/2023 1139   NA 136 03/28/2023 1629   K 4.0 04/13/2023 1139   CL 103 04/13/2023 1139   CO2 27 04/13/2023  1139   GLUCOSE 80 04/13/2023 1139   BUN 18 04/13/2023 1139   BUN 15 03/28/2023 1629   CREATININE 1.41 (H) 04/13/2023 1139   CALCIUM 9.2 04/13/2023 1139   GFRNONAA 41 (L) 04/13/2023 1139   GFRAA >60 03/25/2020 0216    Renal function: Estimated Creatinine Clearance: 29.9 mL/min (A) (by C-G formula based on SCr of 1.41 mg/dL (H)).  Clinical ASCVD: No  The 10-year ASCVD risk score (Arnett DK, et al., 2019) is: 19.2%   Values used to calculate the score:     Age: 11 years     Sex: Female     Is Non-Hispanic African American: Yes     Diabetic: No     Tobacco smoker: Yes     Systolic Blood Pressure: 153 mmHg     Is BP treated: Yes     HDL Cholesterol: 61 MG/DL     Total Cholesterol: 138 MG/DL  ASCVD risk factors include- [place]   ASSESSMENT & PLAN:   Meds ordered this encounter  Medications   amLODipine (NORVASC) 10 MG tablet    Sig: Take 1 tablet (10 mg total) by mouth daily.    Dispense:  30 tablet    Refill:  1    Please send a replace/new response with 100-Day Supply if appropriate to maximize member benefit. Requesting 1 year supply.    Order Specific Question:   Supervising Provider    Answer:   Quentin Angst [[id number]]   loratadine (CLARITIN) 10 MG tablet    Sig: Take 1 tablet (10 mg total) by mouth daily as needed for allergies.    Dispense:  90 tablet    Refill:  1    Order Specific Question:   Supervising Provider    Answer:   Quentin Angst [[id number]]   XBJYNW was seen today  for hypertension.  Diagnoses and all orders for this visit:  Essential hypertension -Counseled on lifestyle modifications for blood pressure control including reduced dietary sodium, increased exercise, weight reduction and adequate sleep. Also, educated patient about the risk for cardiovascular events, stroke and heart attack. Also counseled patient about the importance of medication adherence. If you participate in smoking, it is important to stop using tobacco as this will increase the risks associated with uncontrolled blood pressure.   Goal BP:  For patients younger than 60: Goal BP < 130/80. For patients 60 and older: Goal BP < 140/90. For patients with diabetes: Goal BP < 130/80 Minimize salt intake. Minimize alcohol intake  Also followed by cardiology   Seasonal allergic rhinitis due to pollen  -     loratadine (CLARITIN) 10 MG tablet; Take 1 tablet (10 mg total) by mouth daily as needed for allergies.  Other orders -     amLODipine (NORVASC) 10 MG tablet; Take 1 tablet (10 mg total) by mouth daily.    This note has been created with Education officer, environmental. Any transcriptional errors are unintentional.   [name], NP 05/01/2023, 7:07 PM

## 2023-05-03 ENCOUNTER — Ambulatory Visit (INDEPENDENT_AMBULATORY_CARE_PROVIDER_SITE_OTHER): Payer: 59 | Admitting: *Deleted

## 2023-05-03 VITALS — BP 158/91 | HR 70 | Temp 97.3°F | Resp 16 | Ht 69.0 in | Wt 104.0 lb

## 2023-05-03 DIAGNOSIS — D509 Iron deficiency anemia, unspecified: Secondary | ICD-10-CM

## 2023-05-03 DIAGNOSIS — D500 Iron deficiency anemia secondary to blood loss (chronic): Secondary | ICD-10-CM

## 2023-05-03 MED ORDER — SODIUM CHLORIDE 0.9 % IV SOLN
300.0000 mg | INTRAVENOUS | Status: DC
  Administered 2023-05-03: 300 mg via INTRAVENOUS
  Filled 2023-05-03: qty 15

## 2023-05-03 MED ORDER — ACETAMINOPHEN 325 MG PO TABS
650.0000 mg | ORAL_TABLET | Freq: Once | ORAL | Status: AC
  Administered 2023-05-03: 650 mg via ORAL
  Filled 2023-05-03: qty 2

## 2023-05-03 MED ORDER — DIPHENHYDRAMINE HCL 25 MG PO CAPS
25.0000 mg | ORAL_CAPSULE | Freq: Once | ORAL | Status: AC
  Administered 2023-05-03: 25 mg via ORAL
  Filled 2023-05-03: qty 1

## 2023-05-03 NOTE — Progress Notes (Signed)
Diagnosis: Iron Deficiency Anemia  Provider:  Chilton Greathouse MD  Procedure: IV Infusion  IV Type: Peripheral, IV Location: R Forearm  Venofer (Iron Sucrose), Dose: 300 mg  Infusion Start Time: 1402 pm  Infusion Stop Time: 1553 pm  Post Infusion IV Care: Observation period completed and Peripheral IV Discontinued  Discharge: Condition: Good, Destination: Home . AVS Provided  Performed by:  Forrest Moron, RN

## 2023-05-03 NOTE — Patient Instructions (Signed)
Iron Sucrose Injection What is this medication? IRON SUCROSE (EYE ern SOO krose) treats low levels of iron (iron deficiency anemia) in people with kidney disease. Iron is a mineral that plays an important role in making red blood cells, which carry oxygen from your lungs to the rest of your body. This medicine may be used for other purposes; ask your health care provider or pharmacist if you have questions. COMMON BRAND NAME(S): Venofer What should I tell my care team before I take this medication? They need to know if you have any of these conditions: Anemia not caused by low iron levels Heart disease High levels of iron in the blood Kidney disease Liver disease An unusual or allergic reaction to iron, other medications, foods, dyes, or preservatives Pregnant or trying to get pregnant Breastfeeding How should I use this medication? This medication is for infusion into a vein. It is given in a hospital or clinic setting. Talk to your care team about the use of this medication in children. While this medication may be prescribed for children as young as 2 years for selected conditions, precautions do apply. Overdosage: If you think you have taken too much of this medicine contact a poison control center or emergency room at once. NOTE: This medicine is only for you. Do not share this medicine with others. What if I miss a dose? Keep appointments for follow-up doses. It is important not to miss your dose. Call your care team if you are unable to keep an appointment. What may interact with this medication? Do not take this medication with any of the following: Deferoxamine Dimercaprol Other iron products This medication may also interact with the following: Chloramphenicol Deferasirox This list may not describe all possible interactions. Give your health care provider a list of all the medicines, herbs, non-prescription drugs, or dietary supplements you use. Also tell them if you smoke,  drink alcohol, or use illegal drugs. Some items may interact with your medicine. What should I watch for while using this medication? Visit your care team regularly. Tell your care team if your symptoms do not start to get better or if they get worse. You may need blood work done while you are taking this medication. You may need to follow a special diet. Talk to your care team. Foods that contain iron include: whole grains/cereals, dried fruits, beans, or peas, leafy green vegetables, and organ meats (liver, kidney). What side effects may I notice from receiving this medication? Side effects that you should report to your care team as soon as possible: Allergic reactions--skin rash, itching, hives, swelling of the face, lips, tongue, or throat Low blood pressure--dizziness, feeling faint or lightheaded, blurry vision Shortness of breath Side effects that usually do not require medical attention (report to your care team if they continue or are bothersome): Flushing Headache Joint pain Muscle pain Nausea Pain, redness, or irritation at injection site This list may not describe all possible side effects. Call your doctor for medical advice about side effects. You may report side effects to FDA at 1-800-FDA-1088. Where should I keep my medication? This medication is given in a hospital or clinic. It will not be stored at home. NOTE: This sheet is a summary. It may not cover all possible information. If you have questions about this medicine, talk to your doctor, pharmacist, or health care provider.  2024 Elsevier/Gold Standard (2023-01-07 00:00:00)

## 2023-05-05 ENCOUNTER — Other Ambulatory Visit (INDEPENDENT_AMBULATORY_CARE_PROVIDER_SITE_OTHER): Payer: Self-pay | Admitting: Primary Care

## 2023-05-06 NOTE — Telephone Encounter (Signed)
Requested Prescriptions  Pending Prescriptions Disp Refills   pantoprazole (PROTONIX) 40 MG tablet [Pharmacy Med Name: PANTOPRAZOLE 40MG  TABLETS] 180 tablet 3    Sig: TAKE 1 TABLET(40 MG) BY MOUTH TWICE DAILY     Gastroenterology: Proton Pump Inhibitors Passed - 05/05/2023  3:07 AM      Passed - Valid encounter within last 12 months    Recent Outpatient Visits           1 week ago Essential hypertension   [place] [facility] [name], NP   6 months ago Need for immunization against influenza   [place] [facility] [name], NP   1 year ago Essential hypertension   [place] [facility] [name], NP   1 year ago Hospital discharge follow-up   [place] [facility] [name], NP   2 years ago Hospital discharge follow-up   [place] [facility] [name], NP       Future Appointments             In 2 weeks Little Ishikawa, MD [facility]   In 5 months Randa Evens, Kinnie Scales, NP [place] [facility]

## 2023-05-10 ENCOUNTER — Ambulatory Visit: Payer: 59

## 2023-05-10 VITALS — BP 137/82 | HR 68 | Temp 97.7°F | Resp 16 | Ht 69.0 in | Wt 104.2 lb

## 2023-05-10 DIAGNOSIS — D509 Iron deficiency anemia, unspecified: Secondary | ICD-10-CM

## 2023-05-10 DIAGNOSIS — D500 Iron deficiency anemia secondary to blood loss (chronic): Secondary | ICD-10-CM

## 2023-05-10 MED ORDER — DIPHENHYDRAMINE HCL 25 MG PO CAPS
25.0000 mg | ORAL_CAPSULE | Freq: Once | ORAL | Status: AC
  Administered 2023-05-10: 25 mg via ORAL
  Filled 2023-05-10: qty 1

## 2023-05-10 MED ORDER — SODIUM CHLORIDE 0.9 % IV SOLN
300.0000 mg | INTRAVENOUS | Status: DC
  Administered 2023-05-10: 300 mg via INTRAVENOUS
  Filled 2023-05-10: qty 15

## 2023-05-10 MED ORDER — ACETAMINOPHEN 325 MG PO TABS
650.0000 mg | ORAL_TABLET | Freq: Once | ORAL | Status: AC
  Administered 2023-05-10: 650 mg via ORAL
  Filled 2023-05-10: qty 2

## 2023-05-10 NOTE — Progress Notes (Signed)
Diagnosis: Iron Deficiency Anemia  Provider:  Chilton Greathouse MD  Procedure: IV Infusion  IV Type: Peripheral, IV Location: R Forearm  Venofer (Iron Sucrose), Dose: 300 mg  Infusion Start Time: 1132  Infusion Stop Time: 1307  Post Infusion IV Care: Peripheral IV Discontinued  Discharge: Condition: Good, Destination: Home . AVS Provided  Performed by:  Marilynn Rail, RN

## 2023-05-13 ENCOUNTER — Other Ambulatory Visit: Payer: Self-pay | Admitting: Cardiology

## 2023-05-17 ENCOUNTER — Ambulatory Visit: Payer: 59 | Admitting: *Deleted

## 2023-05-17 ENCOUNTER — Other Ambulatory Visit: Payer: Self-pay | Admitting: Cardiology

## 2023-05-17 VITALS — BP 132/79 | HR 66 | Temp 97.6°F | Resp 16 | Ht 69.0 in | Wt 101.6 lb

## 2023-05-17 DIAGNOSIS — D500 Iron deficiency anemia secondary to blood loss (chronic): Secondary | ICD-10-CM

## 2023-05-17 DIAGNOSIS — D509 Iron deficiency anemia, unspecified: Secondary | ICD-10-CM | POA: Diagnosis not present

## 2023-05-17 MED ORDER — SODIUM CHLORIDE 0.9 % IV SOLN
300.0000 mg | INTRAVENOUS | Status: DC
  Administered 2023-05-17: 300 mg via INTRAVENOUS
  Filled 2023-05-17: qty 15

## 2023-05-17 MED ORDER — ACETAMINOPHEN 325 MG PO TABS
650.0000 mg | ORAL_TABLET | Freq: Once | ORAL | Status: AC
  Administered 2023-05-17: 650 mg via ORAL
  Filled 2023-05-17: qty 2

## 2023-05-17 MED ORDER — DIPHENHYDRAMINE HCL 25 MG PO CAPS
25.0000 mg | ORAL_CAPSULE | Freq: Once | ORAL | Status: AC
  Administered 2023-05-17: 25 mg via ORAL
  Filled 2023-05-17: qty 1

## 2023-05-17 NOTE — Progress Notes (Addendum)
Diagnosis: Iron Deficiency Anemia  Provider:  Chilton Greathouse MD  Procedure: IV Infusion  IV Type: Peripheral, IV Location: L Forearm  Venofer (Iron Sucrose), Dose: 300 mg  Infusion Start Time: 1307  Infusion Stop Time: 1450 pm  Post Infusion IV Care: Observation period completed and Peripheral IV Discontinued  Discharge: Condition: Good, Destination: Home . AVS Provided  Performed by:  Adriana Mccallum, RN

## 2023-05-20 ENCOUNTER — Ambulatory Visit: Payer: 59 | Attending: Cardiology | Admitting: Cardiology

## 2023-05-20 ENCOUNTER — Encounter: Payer: Self-pay | Admitting: Cardiology

## 2023-05-20 VITALS — BP 130/80 | HR 82 | Ht 69.0 in | Wt 98.0 lb

## 2023-05-20 DIAGNOSIS — I10 Essential (primary) hypertension: Secondary | ICD-10-CM

## 2023-05-20 DIAGNOSIS — D509 Iron deficiency anemia, unspecified: Secondary | ICD-10-CM | POA: Diagnosis not present

## 2023-05-20 DIAGNOSIS — I5042 Chronic combined systolic (congestive) and diastolic (congestive) heart failure: Secondary | ICD-10-CM

## 2023-05-20 DIAGNOSIS — I4892 Unspecified atrial flutter: Secondary | ICD-10-CM | POA: Diagnosis not present

## 2023-05-20 DIAGNOSIS — E785 Hyperlipidemia, unspecified: Secondary | ICD-10-CM

## 2023-05-20 DIAGNOSIS — I739 Peripheral vascular disease, unspecified: Secondary | ICD-10-CM

## 2023-05-20 NOTE — Patient Instructions (Signed)
Medication Instructions:  No changes *If you need a refill on your cardiac medications before your next appointment, please call your pharmacy*   Lab Work: No Labs If you have labs (blood work) drawn today and your tests are completely normal, you will receive your results only by: MyChart Message (if you have MyChart) OR A paper copy in the mail If you have any lab test that is abnormal or we need to change your treatment, we will call you to review the results.   Testing/Procedures: No Testing   Follow-Up: At [facility], you and your health needs are our priority.  As part of our continuing mission to provide you with exceptional heart care, we have created designated Provider Care Teams.  These Care Teams include your primary Cardiologist (physician) and Advanced Practice Providers (APPs -  Physician Assistants and Nurse Practitioners) who all work together to provide you with the care you need, when you need it.  We recommend signing up for the patient portal called "MyChart".  Sign up information is provided on this After Visit Summary.  MyChart is used to connect with patients for Virtual Visits (Telemedicine).  Patients are able to view lab/test results, encounter notes, upcoming appointments, etc.  Non-urgent messages can be sent to your provider as well.   To learn more about what you can do with MyChart, go to [email or web address].    Your next appointment:   4 month(s)  Provider:   Little Ishikawa, MD

## 2023-05-20 NOTE — Progress Notes (Signed)
Cardiology Office Note:    Date:  05/20/2023   ID:  Veronica Stewart, DOB [birth year], MRN [id number]  PCP:  Veronica Sessions, NP  Cardiologist:  Little Ishikawa, MD  Electrophysiologist:  None   Referring MD: Veronica Sessions, NP   Chief Complaint  Patient presents with   Atrial Flutter     History of Present Illness:    Veronica Stewart is a 67 y.o. female with a hx of atrial flutter, PAD status post stent pop bypass, hypertension, asthma who presents for follow-up.  She was admitted to [facility] on 02/11/2021 with epistaxis and new onset atrial flutter.  Anticoagulation was initially held due to her epistaxis and she was evaluated by ENT.  Underwent packing in epistaxis resolved, and she was started on Eliquis, which he tolerated well.  She had been on aspirin/Plavix for her PAD given her left external iliac stenting in August 2021.  She was transitioned to Eliquis monotherapy.  Echocardiogram 02/13/2020 showed EF 40 to 45%.  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  She completed adjuvant chemotherapy and radiation.  Echocardiogram 02/22/2022 showed EF 55 to 60%, normal diastolic function, normal RV function, RVSP 41 mmHg, mild MR, mild to moderate TR.  Zio patch was worn for 9 days 02/2022 the only had 1 day of analysis time, no significant arrhythmias were seen.  Echocardiogram 03/28/2023 showed EF 60 to 65%, normal RV function, moderately elevated pulmonary pressures, moderate biatrial enlargement, no significant valvular disease.   Since last clinic visit, she reports he is doing okay.  Denies any chest pain, dyspnea, lightheadedness, syncope, lower extremity edema, or palpitations.  She still smoking 8 to 9 cigarettes/day.  Has not noted any bleeding.  Past Medical History:  Diagnosis Date   Asthma    Atrial fibrillation (HCC)     Dermatophytosis of nail    Hypertension    Oropharyngeal dysphagia    Squamous cell carcinoma of nasal cavity [facility])     Past Surgical History:  Procedure Laterality Date   ABDOMINAL AORTOGRAM W/LOWER EXTREMITY Bilateral 03/05/2020   Procedure: ABDOMINAL AORTOGRAM W/LOWER EXTREMITY;  Surgeon: Cephus Shelling, MD;  Location: [facility] INVASIVE CV LAB;  Service: Cardiovascular;  Laterality: Bilateral;   ENDARTERECTOMY FEMORAL Left 03/24/2020   Procedure: LEFT COMMON FEMORAL ENDARTERECTOMY;  Surgeon: Cephus Shelling, MD;  Location: [facility] OR;  Service: Vascular;  Laterality: Left;   ENDOVEIN HARVEST OF GREATER SAPHENOUS VEIN Left 03/24/2020   Procedure: HARVEST OF GREATER SAPHENOUS VEIN;  Surgeon: Cephus Shelling, MD;  Location: [facility] OR;  Service: Vascular;  Laterality: Left;   FEMORAL-POPLITEAL BYPASS GRAFT Left 03/24/2020   Procedure: BYPASS GRAFT FEMORAL-POPLITEAL ARTERY;  Surgeon: Cephus Shelling, MD;  Location: [facility] OR;  Service: Vascular;  Laterality: Left;   INSERTION OF ILIAC STENT Left 03/24/2020   Procedure: INSERTION OF RETROGRADE ILIAC STENT;  Surgeon: Cephus Shelling, MD;  Location: [facility] OR;  Service: Vascular;  Laterality: Left;   INTRAMEDULLARY (IM) NAIL INTERTROCHANTERIC Left 03/18/2018   Procedure: INTRAMEDULLARY (IM) NAIL INTERTROCHANTRIC;  Surgeon: Roby Lofts, MD;  Location: [facility] OR;  Service: Orthopedics;  Laterality: Left;   INTRAOPERATIVE ARTERIOGRAM Left 03/24/2020   Procedure: INTRA OPERATIVE ARTERIOGRAM;  Surgeon: Cephus Shelling, MD;  Location: [facility] OR;  Service: Vascular;  Laterality: Left;    Current Medications: Current Meds  Medication Sig   acetaminophen (TYLENOL) 500  MG tablet Take 500 mg by mouth every 6 (six) hours as needed for mild pain.   albuterol (VENTOLIN HFA) 108 (90 Base) MCG/ACT inhaler INHALE 2 PUFFS BY MOUTH EVERY 6 HOURS AS NEEDED FOR WHEEZING AND SHORTNESS OF BREATH   amiodarone (PACERONE) 200 MG tablet TAKE 1 TABLET BY MOUTH DAILY    amLODipine (NORVASC) 10 MG tablet Take 1 tablet (10 mg total) by mouth daily.   aspirin EC 81 MG tablet Take 81 mg by mouth daily. Swallow whole.   atorvastatin (LIPITOR) 20 MG tablet Take 1 tablet (20 mg total) by mouth daily.   DEEP SEA NASAL SPRAY 0.65 % nasal spray SMARTSIG:Both Nares   feeding supplement (ENSURE ENLIVE / ENSURE PLUS) LIQD Take 237 mLs by mouth 3 (three) times daily between meals.   fluticasone (FLONASE) 50 MCG/ACT nasal spray SHAKE LIQUID AND USE 2 SPRAYS IN EACH NOSTRIL DAILY   Fluticasone-Salmeterol (ADVAIR) 100-50 MCG/DOSE AEPB Inhale 1 puff into the lungs 2 (two) times daily.   hydrALAZINE (APRESOLINE) 25 MG tablet Take 1 tablet (25 mg total) by mouth 2 (two) times daily.   loratadine (CLARITIN) 10 MG tablet Take 1 tablet (10 mg total) by mouth daily as needed for allergies.   metoprolol succinate (TOPROL-XL) 25 MG 24 hr tablet TAKE 1 TABLET BY MOUTH DAILY  WITH A MEAL OR IMMEDIATELY AFTER THE SAME MEAL   ondansetron (ZOFRAN) 8 MG tablet Take 8 mg by mouth 3 (three) times daily.   pantoprazole (PROTONIX) 40 MG tablet TAKE 1 TABLET(40 MG) BY MOUTH TWICE DAILY   sodium chloride 1 g tablet Take 1 g by mouth 2 (two) times daily.   Current Facility-Administered Medications for the 05/20/23 encounter (Office Visit) with Little Ishikawa, MD  Medication   albuterol (PROVENTIL) (2.5 MG/3ML) 0.083% nebulizer solution 2.5 mg     Allergies:   Patient has no known allergies.   Social History   Socioeconomic History   Marital status: Legally Separated    Spouse name: Not on file   Number of children: Not on file   Years of education: Not on file   Highest education level: Not on file  Occupational History   Not on file  Tobacco Use   Smoking status: Every Day    Current packs/day: 0.50    Types: Cigarettes   Smokeless tobacco: Never  Vaping Use   Vaping status: Never Used  Substance and Sexual Activity   Alcohol use: Yes    Comment: two 40 oz beers most days     Drug use: Not Currently    Types: Marijuana   Sexual activity: Not on file  Other Topics Concern   Not on file  Social History Narrative   Not on file   Social Determinants of Health   Financial Resource Strain: Not on file  Food Insecurity: Not on file  Transportation Needs: Not on file  Physical Activity: Not on file  Stress: Not on file  Social Connections: Not on file     Family History: [name] is negative for Sudden Cardiac Death.  ROS:   Please see the history of present illness.      All other systems reviewed and are negative.  EKGs/Labs/Other Studies Reviewed:    The following studies were reviewed today:   EKG:   03/03/2023: Sinus bradycardia, rate 58, poor R wave progression, no ST abnormalities, low voltage 03/23/22: Sinus rhythm, rate 60, first-degree AV block, poor R wave progression, Q waves in V1-3  02/09/2022: Sinus bradycardia, rate 55, Q waves in V1-3 05/11/21: Normal sinus rhythm, rate 73, Q waves V1-4 07/22:sinus rhythm, rate 63, Q-wave V1-V4  Recent Labs: 03/28/2023: TSH 2.250 04/13/2023: ALT 10; BUN 18; Creatinine 1.41; Potassium 4.0; Sodium 137 04/20/2023: Hemoglobin 7.3; Platelet Count 343  Recent Lipid Panel    Component Value Date/Time   CHOL 89 03/25/2020 0216   CHOL 173 03/26/2019 1450   TRIG 31 03/25/2020 0216   HDL 44 03/25/2020 0216   HDL 102 03/26/2019 1450   CHOLHDL 2.0 03/25/2020 0216   VLDL 6 03/25/2020 0216   LDLCALC 39 03/25/2020 0216   LDLCALC 48 03/26/2019 1450    Physical Exam:    VS:  BP 130/80   Pulse 82   Ht 5\' 9"  (1.753 m)   Wt 98 lb (44.5 kg)   SpO2 90%   BMI 14.47 kg/m     Wt Readings from Last 3 Encounters:  05/20/23 98 lb (44.5 kg)  05/17/23 101 lb 9.6 oz (46.1 kg)  05/10/23 104 lb 3.2 oz (47.3 kg)     GEN: Cachectic, in no acute distress NECK: No JVD CARDIAC: RRR, no murmurs, rubs, gallops RESPIRATORY:  Clear to auscultation without rales, wheezing or rhonchi  ABDOMEN: Soft,  non-tender, non-distended MUSCULOSKELETAL:  No edema; SKIN: Warm and dry NEUROLOGIC:  Alert and oriented x 3 PSYCHIATRIC:  Normal affect   ASSESSMENT:    1. Atrial flutter, unspecified type (HCC)   2. Chronic combined systolic and diastolic heart failure (HCC)   3. Essential hypertension   4. Iron deficiency anemia, unspecified iron deficiency anemia type   5. PAD (peripheral artery disease) (HCC)   6. Hyperlipidemia, unspecified hyperlipidemia type       PLAN:    Atrial Flutter: Noted during admission 01/2021, rates up to 140s.  CHADSVASc score 4 (CHF, HTN, PAD, female).  Echo 02/12/21 shows EF 40 to 45%.  Echocardiogram 02/22/2022 showed EF 55 to 60%, normal diastolic function, normal RV function, RVSP 41 mmHg, mild MR, mild to moderate TR.  Zio patch was worn for 9 days 02/2022 the only had 1 day of analysis time, no significant arrhythmias were seen. -Given she was going in and out of atrial flutter during admission, was started on amiodarone to maintain sinus rhythm.  Appears to be maintaining sinus rhythm on amiodarone 200 mg daily.  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  Referred to EP to evaluate for ablation, recommended continuing amiodarone at this time -Holding Eliquis in setting of anemia -Continue Toprol-XL 25 mg daily   Chronic combined systolic and diastolic heart failure: Echo 02/12/2021 shows EF 40 to 45%.  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  Echocardiogram 02/22/2022 showed EF 55 to 60%, normal diastolic function, normal RV function, RVSP 41 mmHg, mild MR, mild to moderate TR.  Echocardiogram 03/28/2023 showed EF 60 to 65%, normal RV function, moderately elevated pulmonary pressures, moderate biatrial enlargement, no significant valvular disease. 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  Recommended holding off and continuing amiodarone -Continue Toprol-XL 25 mg daily  Anemia: Hemoglobin 7.9 on 03/28/2023, down from 12.0 01/2022.  Iron studies consistent with iron deficiency anemia.  Referred to GI and hematology.  Started on IV iron   HTN:  started lisinopril-HCTZ 20-25 mg daily but developed AKI and was discontinued.  Currently  on amlodipine 10 mg daily, hydralazine 25 mg 2 times daily and Toprol-XL 25 mg daily   PAD s/p fem-pop bypass: with left external iliac stenting 8/21. Has been on ASA/plavix since that time.  During admission in June 2022, aspirin/Plavix were discontinued and switched to Eliquis monotherapy given her new atrial flutter as above.  Continue atorvastatin.  Eliquis currently on hold as above, restarted ASA 81 mg daily.  Continue statin  Hyperlipidemia: LDL 65 on 08/18/2021.  Continue atorvastatin 20 mg daily.  Tobacco use: Counseled risk of tobacco use and cessation strongly encouraged.    AKI: Creatinine up to 1.76 on 02/22/2022.  Likely due to lisinopril-HCTZ.  Medication discontinued, improvement in creatinine to 1.17 on 03/15/2022.  Most recent creatinine 1.41 on 04/13/2023   RTC in 4 months   Medication Adjustments/Labs and Tests Ordered: Current medicines are reviewed at length with the patient today.  Concerns regarding medicines are outlined above.  No orders of the defined types were placed in this encounter.    No orders of the defined types were placed in this encounter.     Patient Instructions  Medication Instructions:  No changes *If you need a refill on your cardiac medications before your next appointment, please call your pharmacy*   Lab Work: No Labs If you have labs (blood work) drawn today and your tests are completely normal, you will receive your results only by: MyChart Message (if you have MyChart) OR A paper copy in the mail If you have any lab test  that is abnormal or we need to change your treatment, we will call you to review the results.   Testing/Procedures: No Testing   Follow-Up: At [facility], you and your health needs are our priority.  As part of our continuing mission to provide you with exceptional heart care, we have created designated Provider Care Teams.  These Care Teams include your primary Cardiologist (physician) and Advanced Practice Providers (APPs -  Physician Assistants and Nurse Practitioners) who all work together to provide you with the care you need, when you need it.  We recommend signing up for the patient portal called "MyChart".  Sign up information is provided on this After Visit Summary.  MyChart is used to connect with patients for Virtual Visits (Telemedicine).  Patients are able to view lab/test results, encounter notes, upcoming appointments, etc.  Non-urgent messages can be sent to your provider as well.   To learn more about what you can do with MyChart, go to [email or web address].    Your next appointment:   4 month(s)  Provider:   Little Ishikawa, MD      Signed, Little Ishikawa, MD  05/20/2023 3:14 PM    [facility]

## 2023-06-14 ENCOUNTER — Ambulatory Visit: Payer: 59 | Admitting: Podiatry

## 2023-06-21 ENCOUNTER — Ambulatory Visit (INDEPENDENT_AMBULATORY_CARE_PROVIDER_SITE_OTHER): Payer: 59 | Admitting: Podiatry

## 2023-06-21 ENCOUNTER — Encounter: Payer: Self-pay | Admitting: Podiatry

## 2023-06-21 DIAGNOSIS — M79674 Pain in right toe(s): Secondary | ICD-10-CM

## 2023-06-21 DIAGNOSIS — M79675 Pain in left toe(s): Secondary | ICD-10-CM

## 2023-06-21 DIAGNOSIS — B351 Tinea unguium: Secondary | ICD-10-CM

## 2023-06-21 DIAGNOSIS — Z7901 Long term (current) use of anticoagulants: Secondary | ICD-10-CM

## 2023-06-21 NOTE — Progress Notes (Signed)
This patient returns to my office for at risk foot care.  This patient requires this care by a professional since this patient will be at risk due to having PAD and coagulation defect due to eliquis.  This patient is unable to cut nails herself since the patient cannot reach her nails.These nails are painful walking and wearing shoes.  She has painful callus both feet.. This patient presents for at risk foot care today.  General Appearance  Alert, conversant and in no acute stress.  Vascular  Dorsalis pedis and posterior tibial  pulses are palpable  bilaterally.  Capillary return is within normal limits  bilaterally. Temperature is within normal limits  bilaterally.  Neurologic  Senn-Weinstein monofilament wire test within normal limits  bilaterally. Muscle power within normal limits bilaterally.  Nails Thick disfigured discolored nails with subungual debris  from hallux to fifth toes left and second to fifth toes right foot.. No evidence of bacterial infection or drainage bilaterally.  Orthopedic  No limitations of motion  feet .  No crepitus or effusions noted.  No bony pathology or digital deformities noted.  Skin  normotropic skin noted bilaterally.  No signs of infections or ulcers noted.    Onychomycosis  Pain in right toes  Pain in left toes     Consent was obtained for treatment procedures.   Mechanical debridement of nails 1-5 left and 2-5 right foot.performed with a nail nipper.  Filed with dremel without incident.     Return office visit   3 months                   Told patient to return for periodic foot care and evaluation due to potential at risk complications.   Helane Gunther DPM

## 2023-06-25 ENCOUNTER — Telehealth: Payer: Self-pay | Admitting: Internal Medicine

## 2023-06-25 NOTE — Telephone Encounter (Signed)
Rescheduled 11/21 appointment time due to provider pal, patient has been called several times but no voicemail is set and mobile number is not in service. Calendar will be mailed.

## 2023-06-27 ENCOUNTER — Ambulatory Visit (INDEPENDENT_AMBULATORY_CARE_PROVIDER_SITE_OTHER): Payer: 59 | Admitting: Nurse Practitioner

## 2023-06-27 ENCOUNTER — Other Ambulatory Visit (INDEPENDENT_AMBULATORY_CARE_PROVIDER_SITE_OTHER): Payer: 59

## 2023-06-27 ENCOUNTER — Other Ambulatory Visit: Payer: Self-pay | Admitting: Physician Assistant

## 2023-06-27 ENCOUNTER — Encounter: Payer: Self-pay | Admitting: Nurse Practitioner

## 2023-06-27 VITALS — BP 108/62 | HR 68 | Ht 69.0 in | Wt 93.2 lb

## 2023-06-27 DIAGNOSIS — Z8589 Personal history of malignant neoplasm of other organs and systems: Secondary | ICD-10-CM

## 2023-06-27 DIAGNOSIS — R634 Abnormal weight loss: Secondary | ICD-10-CM | POA: Diagnosis not present

## 2023-06-27 DIAGNOSIS — D509 Iron deficiency anemia, unspecified: Secondary | ICD-10-CM

## 2023-06-27 DIAGNOSIS — R0989 Other specified symptoms and signs involving the circulatory and respiratory systems: Secondary | ICD-10-CM

## 2023-06-27 LAB — IBC + FERRITIN
Ferritin: 240.8 ng/mL (ref 10.0–291.0)
Iron: 83 ug/dL (ref 42–145)
Saturation Ratios: 31.7 % (ref 20.0–50.0)
TIBC: 261.8 ug/dL (ref 250.0–450.0)
Transferrin: 187 mg/dL — ABNORMAL LOW (ref 212.0–360.0)

## 2023-06-27 LAB — CBC WITH DIFFERENTIAL/PLATELET
Basophils Absolute: 0 10*3/uL (ref 0.0–0.1)
Basophils Relative: 0.6 % (ref 0.0–3.0)
Eosinophils Absolute: 0.2 10*3/uL (ref 0.0–0.7)
Eosinophils Relative: 2.9 % (ref 0.0–5.0)
HCT: 38.4 % (ref 36.0–46.0)
Hemoglobin: 12.2 g/dL (ref 12.0–15.0)
Lymphocytes Relative: 24 % (ref 12.0–46.0)
Lymphs Abs: 1.5 10*3/uL (ref 0.7–4.0)
MCHC: 31.8 g/dL (ref 30.0–36.0)
MCV: 73.6 fL — ABNORMAL LOW (ref 78.0–100.0)
Monocytes Absolute: 0.6 10*3/uL (ref 0.1–1.0)
Monocytes Relative: 9.1 % (ref 3.0–12.0)
Neutro Abs: 3.8 10*3/uL (ref 1.4–7.7)
Neutrophils Relative %: 63.4 % (ref 43.0–77.0)
Platelets: 213 10*3/uL (ref 150.0–400.0)
RBC: 5.22 Mil/uL — ABNORMAL HIGH (ref 3.87–5.11)
RDW: 33.1 % — ABNORMAL HIGH (ref 11.5–15.5)
WBC: 6.1 10*3/uL (ref 4.0–10.5)

## 2023-06-27 LAB — COMPREHENSIVE METABOLIC PANEL
ALT: 19 U/L (ref 0–35)
AST: 24 U/L (ref 0–37)
Albumin: 4.3 g/dL (ref 3.5–5.2)
Alkaline Phosphatase: 81 U/L (ref 39–117)
BUN: 28 mg/dL — ABNORMAL HIGH (ref 6–23)
CO2: 26 meq/L (ref 19–32)
Calcium: 9.9 mg/dL (ref 8.4–10.5)
Chloride: 99 meq/L (ref 96–112)
Creatinine, Ser: 1.47 mg/dL — ABNORMAL HIGH (ref 0.40–1.20)
GFR: 36.92 mL/min — ABNORMAL LOW (ref >60.00)
Glucose, Bld: 79 mg/dL (ref 70–99)
Potassium: 4.8 meq/L (ref 3.5–5.1)
Sodium: 136 meq/L (ref 135–145)
Total Bilirubin: 0.5 mg/dL (ref 0.2–1.2)
Total Protein: 8.3 g/dL (ref 6.0–8.3)

## 2023-06-27 NOTE — Patient Instructions (Signed)
You have been scheduled for a CT scan of the abdomen and pelvis at [facility], 1st floor Radiology. You are scheduled on 11/19 at 2:00 pm. You should arrive at 11:45 am for registration.    You may take any medications as prescribed with a small amount of water, if necessary. If you take any of the following medications: METFORMIN, GLUCOPHAGE, GLUCOVANCE, AVANDAMET, RIOMET, FORTAMET, ACTOPLUS MET, JANUMET, GLUMETZA or METAGLIP, you MAY be asked to HOLD this medication 48 hours AFTER the exam.   The purpose of you drinking the oral contrast is to aid in the visualization of your intestinal tract. The contrast solution may cause some diarrhea. Depending on your individual set of symptoms, you may also receive an intravenous injection of x-ray contrast/dye. Plan on being at [facility] for 45 minutes or longer, depending on the type of exam you are having performed.   If you have any questions regarding your exam or if you need to reschedule, you may call [facility] at [phone] between the hours of 8:00 am and 5:00 pm, Monday-Friday.   Your provider has requested that you go to the basement level for lab work before leaving today. Press "B" on the elevator. The lab is located at the first door on the left as you exit the elevator.  Further recommendations to be determined after lab & CT results are reviewed.  Follow up with oncologist, Dr.Mohamed.  Drink 2 cans of Ensure daily.  Due to recent changes in healthcare laws, you may see the results of your imaging and laboratory studies on MyChart before your provider has had a chance to review them.  We understand that in some cases there may be results that are confusing or concerning to you. Not all laboratory results come back in the same time frame and the provider may be waiting for multiple results in order to interpret others.  Please give [place] 48 hours in order for your provider to thoroughly review all the results before  contacting the office for clarification of your results.   Thank you for trusting me with your gastrointestinal care!   Alcide Evener, CRNP

## 2023-06-27 NOTE — Progress Notes (Signed)
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  Past abdominal surgery secondary to possible ulcers in 2005.  She presents to our office today as referred by Dr. Si Gaul for further evaluation regarding iron deficiency anemia.   She denies having any nausea or vomiting. No heartburn. She has pill dysphagia but no difficulty swallowing solid foods or liquids. She crushes her pills in applesauce which goes down without any difficulty. She has lost 8 pounds over the past month. She is passing a normal brown formed bowel movement most days. She denies seeing any bloody or black stools. She is taking Pantoprazole 40 mg twice daily but she is not sure why she is taking this medication. She denies ever having an upper endoscopy or colonoscopy. CBC on 04/01/2023 showed persistent anemia with hemoglobin of 7.7 and hematocrit 29.3% with MCV of 64. Iron study showed low serum iron of 14 with iron saturation of 3% and ferritin level of 7. She received PRBC transfusion 04/20/2023 and IV  iron infusions x 3 per Dr. Shirline Frees.  Repeat CBC posttransfusion has not been completed.      Latest Ref Rng & Units 04/20/2023    9:02 AM 04/13/2023   11:39 AM 04/01/2023   12:05 PM  CBC  WBC 4.0 - 10.5 K/uL 5.0  4.7  5.4   Hemoglobin 12.0 - 15.0 g/dL 7.3  7.0  7.7   Hematocrit 36.0 - 46.0 % 26.3  25.3  29.3   Platelets 150 - 400 K/uL 343  271  319        Latest Ref Rng & Units 04/13/2023   11:39 AM 03/28/2023    4:29 PM 08/25/2022   10:33 AM  CMP  Glucose 70 - 99 mg/dL 80  77  78   BUN 8 - 23 mg/dL 18  15  14     Creatinine 0.44 - 1.00 mg/dL 4.03  4.74  2.59   Sodium 135 - 145 mmol/L 137  136  137   Potassium 3.5 - 5.1 mmol/L 4.0  4.6  5.1   Chloride 98 - 111 mmol/L 103  98  101   CO2 22 - 32 mmol/L 27  24  27    Calcium 8.9 - 10.3 mg/dL 9.2  9.2  9.6   Total Protein 6.5 - 8.1 g/dL 7.9  7.8  8.0   Total Bilirubin 0.3 - 1.2 mg/dL 0.4  0.4  0.4   Alkaline Phos 38 - 126 U/L 51  64  88   AST 15 - 41 U/L 18  25  24    ALT 0 - 44 U/L 10  12  17        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(HCC)    Past Surgical History:  Procedure Laterality Date   ABDOMINAL AORTOGRAM W/LOWER EXTREMITY Bilateral 03/05/2020   Procedure: ABDOMINAL AORTOGRAM W/LOWER EXTREMITY;  Surgeon: Cephus Shelling, MD;  [facility];  Service: Cardiovascular;  Laterality: Bilateral;  ENDARTERECTOMY FEMORAL Left 03/24/2020   Procedure: LEFT COMMON FEMORAL ENDARTERECTOMY;  Surgeon: Cephus Shelling, MD;  Location: [facility] OR;  Service: Vascular;  Laterality: Left;   ENDOVEIN HARVEST OF GREATER SAPHENOUS VEIN Left 03/24/2020   Procedure: HARVEST OF GREATER SAPHENOUS VEIN;  Surgeon: Cephus Shelling, MD;  Location: [facility] OR;  Service: Vascular;  Laterality: Left;   FEMORAL-POPLITEAL BYPASS GRAFT Left 03/24/2020   Procedure: BYPASS GRAFT FEMORAL-POPLITEAL ARTERY;  Surgeon: Cephus Shelling, MD;  Location: [facility] OR;  Service: Vascular;  Laterality: Left;   INSERTION OF ILIAC STENT Left 03/24/2020   Procedure: INSERTION OF RETROGRADE ILIAC STENT;  Surgeon: Cephus Shelling, MD;  Location: [facility] OR;  Service: Vascular;  Laterality: Left;   INTRAMEDULLARY (IM) NAIL INTERTROCHANTERIC Left 03/18/2018   Procedure: INTRAMEDULLARY (IM) NAIL INTERTROCHANTRIC;  Surgeon: Roby Lofts, MD;  Location: [facility] OR;  Service: Orthopedics;  Laterality: Left;   INTRAOPERATIVE ARTERIOGRAM  Left 03/24/2020   Procedure: INTRA OPERATIVE ARTERIOGRAM;  Surgeon: Cephus Shelling, MD;  Location: [facility] OR;  Service: Vascular;  Laterality: Left;   Social History:She is separated.  She lives with her aunt.  She smokes cigarettes 1/2 ppd x since age 56. She drinks 2 beers nightly, sometimes skips a night. No drug use.   Family History: Paternal aunt had breast cancer.  No known family history of esophageal, gastric or colon cancer.   No Known Allergies    Outpatient Encounter Medications as of 06/27/2023  Medication Sig   acetaminophen (TYLENOL) 500 MG tablet Take 500 mg by mouth every 6 (six) hours as needed for mild pain.   albuterol (VENTOLIN HFA) 108 (90 Base) MCG/ACT inhaler INHALE 2 PUFFS BY MOUTH EVERY 6 HOURS AS NEEDED FOR WHEEZING AND SHORTNESS OF BREATH   amiodarone (PACERONE) 200 MG tablet TAKE 1 TABLET BY MOUTH DAILY   amLODipine (NORVASC) 10 MG tablet Take 1 tablet (10 mg total) by mouth daily.   aspirin EC 81 MG tablet Take 81 mg by mouth daily. Swallow whole.   DEEP SEA NASAL SPRAY 0.65 % nasal spray SMARTSIG:Both Nares   fluticasone (FLONASE) 50 MCG/ACT nasal spray SHAKE LIQUID AND USE 2 SPRAYS IN EACH NOSTRIL DAILY   Fluticasone-Salmeterol (ADVAIR) 100-50 MCG/DOSE AEPB Inhale 1 puff into the lungs 2 (two) times daily.   hydrALAZINE (APRESOLINE) 25 MG tablet Take 1 tablet (25 mg total) by mouth 2 (two) times daily.   metoprolol succinate (TOPROL-XL) 25 MG 24 hr tablet TAKE 1 TABLET BY MOUTH DAILY  WITH A MEAL OR IMMEDIATELY AFTER THE SAME MEAL   ondansetron (ZOFRAN) 8 MG tablet Take 8 mg by mouth 3 (three) times daily.   pantoprazole (PROTONIX) 40 MG tablet TAKE 1 TABLET(40 MG) BY MOUTH TWICE DAILY   [DISCONTINUED] atorvastatin (LIPITOR) 20 MG tablet Take 1 tablet (20 mg total) by mouth daily. (Patient not taking: Reported on 06/27/2023)   [DISCONTINUED] feeding supplement (ENSURE ENLIVE / ENSURE PLUS) LIQD Take 237 mLs by mouth 3 (three) times daily between meals.  (Patient not taking: Reported on 06/27/2023)   [DISCONTINUED] loratadine (CLARITIN) 10 MG tablet Take 1 tablet (10 mg total) by mouth daily as needed for allergies.   [DISCONTINUED] sodium chloride 1 g tablet Take 1 g by mouth 2 (two) times daily.   Facility-Administered Encounter Medications as of 06/27/2023  Medication   albuterol (PROVENTIL) (2.5 MG/3ML) 0.083% nebulizer solution 2.5 mg    REVIEW OF SYSTEMS:  Gen: See HPI. No fevers. CV: Denies chest pain, palpitations or edema. Resp: Denies  cough, shortness of breath of hemoptysis.  GI: See HPI. GU: Denies urinary burning, blood in urine, increased urinary frequency or incontinence. MS: Denies joint pain, muscles aches or weakness. Derm: Denies rash, itchiness, skin lesions or unhealing ulcers. Psych: Denies depression, anxiety, memory loss or confusion. Heme: Denies bruising, easy bleeding. Neuro:  Denies headaches, dizziness or paresthesias. Endo:  Denies any problems with DM, thyroid or adrenal function.  PHYSICAL EXAM: BP 108/62   Pulse 68   Ht 5\' 9"  (1.753 m)   Wt 93 lb 4 oz (42.3 kg)   BMI 13.77 kg/m   Wt Readings from Last 3 Encounters:  06/27/23 93 lb 4 oz (42.3 kg)  05/20/23 98 lb (44.5 kg)  05/17/23 101 lb 9.6 oz (46.1 kg)   General: Cachectic appearing 67 year old female in no acute distress. Head: Normocephalic and atraumatic. Eyes:  Sclerae non-icteric, conjunctive pink. Ears: Normal auditory acuity. Mouth: Right nasal/maxillary concave deformity secondary to nasal squamous cell cancer s/p radiation treatment. Patient cannot fully open mouth secondary to this deformity.  Neck: Supple, no lymphadenopathy or thyromegaly.  Lungs: Clear bilaterally to auscultation without wheezes, crackles or rhonchi. Heart: Regular rate and rhythm. No murmur, rub or gallop appreciated.  Abdomen: Soft, flat, nontender, nondistended. No masses. No hepatosplenomegaly. Mid abdominal aorta is pulsatile with a moderate bruit.  Extensive midline abdominal scar. LUQ scar from prior PEG tube site intact.  Rectal: Deferred.  Musculoskeletal: Symmetrical with no gross deformities. Skin: Warm and dry. No rash or lesions on visible extremities. Extremities: No edema. Neurological: Alert oriented x 4, no focal deficits.  Psychological:  Alert and cooperative. Normal mood and affect.  ASSESSMENT AND PLAN:  67 year old female with iron deficiency anemia. No overt GI bleeding. Received IV iron x 3 and PRBC transfusion x 1 per hematologist Dr. Shirline Frees.  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 Required temporary trache/gastrostomy tube, subsequently removed.   Weight loss -Ensure 2 cans daily -Patient encouraged to eat 3 meals daily -CTAP as ordered above  Peripheral arterial disease s/p left external iliac stent placement and left fem-pop bypass 03/2020. No longer on Eliquis.  Atrial flutter. Not on AC.  Remains on Amiodarone and Toprol XL.  LVEF 55 to 60% per echo 02/2022.  Midline abdominal aorta pulsatile with + bruit -CTAP as ordered above  Chronic tobacco use, smokes cigarettes < 1PPD pack     CC:  Si Gaul, MD

## 2023-06-28 NOTE — Progress Notes (Signed)
I agree with the assessment and plan as outlined by Ms. Kennedy-Smith. 

## 2023-07-04 NOTE — Progress Notes (Deleted)
[facility] OFFICE PROGRESS NOTE  [name], NP [address] [place] [address]  DIAGNOSIS:  1) history of nasal squamous cell carcinoma status post resection followed by concurrent chemoradiation treated at [facility] in 2022.  2) persistent anemia that has been going on for few years. This is likely iron deficiency anemia from gastrointestinal blood loss. The patient had no previous gastrointestinal workup or screening colonoscopy. ***  PRIOR THERAPY: IV iron infusions with Venofer 300 mg IV weekly x3, last dose on 05/17/23  CURRENT THERAPY: ***Oral iron supplements ***  INTERVAL HISTORY: Veronica Stewart 67 y.o. female returns to the clinic today for follow-up visit.  The patient establish care with Dr. Arbutus Ped on 04/13/23 for persistent anemia.  She had significant anemia at that time with a hemoglobin of 7.  Dr. Arbutus Ped arrange for 1 unit of blood and an IV iron infusion with Venofer 300 mg weekly x 3.  Her most recent dose was on 05/17/2023.  She is also currently taking a ***supplement and is ***compliant?  In the interval since last being seen she is feeling ***  Dr. Arbutus Ped also referred the patient to gastroenterology for consideration of endoscopy and colonoscopy.  She establish care with Dr. Leonides Schanz Who is planning on ***arranging for CT of the abdomen pelvis prior to pursuing endoscopic evaluation.  Due to her multiple comorbidities if she pursues EGD and colonoscopy they recommend performing this in the hospital setting (the patient is unable to fully open her mouth secondary to prior right squamous cell carcinoma/status post resection and chemoradiation in the right nasal passage.  Her CT scan was performed earlier this week and the results Are***  Regarding symptoms she is feeling ***today.  Fatigue?  She denies any shortness of breath.  Denies any lightheadedness.  Denies any visible bleeding or bruising.  She is here today  for evaluation and repeat blood work MEDICAL HISTORY: 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(HCC)     ALLERGIES:  has No Known Allergies.  MEDICATIONS:  Current Outpatient Medications  Medication Sig Dispense Refill   acetaminophen (TYLENOL) 500 MG tablet Take 500 mg by mouth every 6 (six) hours as needed for mild pain.     albuterol (VENTOLIN HFA) 108 (90 Base) MCG/ACT inhaler INHALE 2 PUFFS BY MOUTH EVERY 6 HOURS AS NEEDED FOR WHEEZING AND SHORTNESS OF BREATH 8.5 g 2   amiodarone (PACERONE) 200 MG tablet TAKE 1 TABLET BY MOUTH DAILY 100 tablet 2   amLODipine (NORVASC) 10 MG tablet Take 1 tablet (10 mg total) by mouth daily. 30 tablet 1   aspirin EC 81 MG tablet Take 81 mg by mouth daily. Swallow whole.     DEEP SEA NASAL SPRAY 0.65 % nasal spray SMARTSIG:Both Nares     fluticasone (FLONASE) 50 MCG/ACT nasal spray SHAKE LIQUID AND USE 2 SPRAYS IN EACH NOSTRIL DAILY 48 g 0   Fluticasone-Salmeterol (ADVAIR) 100-50 MCG/DOSE AEPB Inhale 1 puff into the lungs 2 (two) times daily. 1 each 3   hydrALAZINE (APRESOLINE) 25 MG tablet Take 1 tablet (25 mg total) by mouth 2 (two) times daily. 180 tablet 2   metoprolol succinate (TOPROL-XL) 25 MG 24 hr tablet TAKE 1 TABLET BY MOUTH DAILY  WITH A MEAL OR IMMEDIATELY AFTER THE SAME MEAL  100 tablet 2   ondansetron (ZOFRAN) 8 MG tablet Take 8 mg by mouth 3 (three) times daily.     pantoprazole (PROTONIX) 40 MG tablet TAKE 1 TABLET(40 MG) BY MOUTH TWICE DAILY 180 tablet 3   Current Facility-Administered Medications  Medication Dose Route Frequency Provider Last Rate Last Admin   albuterol (PROVENTIL) (2.5 MG/3ML) 0.083% nebulizer solution 2.5 mg  2.5 mg Nebulization Once [name], NP        SURGICAL HISTORY:  Past Surgical History:  Procedure Laterality Date   ABDOMINAL AORTOGRAM W/LOWER  EXTREMITY Bilateral 03/05/2020   Procedure: ABDOMINAL AORTOGRAM W/LOWER EXTREMITY;  Surgeon: Cephus Shelling, MD;  Location: [facility];  Service: Cardiovascular;  Laterality: Bilateral;   ENDARTERECTOMY FEMORAL Left 03/24/2020   Procedure: LEFT COMMON FEMORAL ENDARTERECTOMY;  Surgeon: Cephus Shelling, MD;  Location: [facility] OR;  Service: Vascular;  Laterality: Left;   ENDOVEIN HARVEST OF GREATER SAPHENOUS VEIN Left 03/24/2020   Procedure: HARVEST OF GREATER SAPHENOUS VEIN;  Surgeon: Cephus Shelling, MD;  Location: [facility] OR;  Service: Vascular;  Laterality: Left;   FEMORAL-POPLITEAL BYPASS GRAFT Left 03/24/2020   Procedure: BYPASS GRAFT FEMORAL-POPLITEAL ARTERY;  Surgeon: Cephus Shelling, MD;  Location: [facility] OR;  Service: Vascular;  Laterality: Left;   INSERTION OF ILIAC STENT Left 03/24/2020   Procedure: INSERTION OF RETROGRADE ILIAC STENT;  Surgeon: Cephus Shelling, MD;  Location: [facility] OR;  Service: Vascular;  Laterality: Left;   INTRAMEDULLARY (IM) NAIL INTERTROCHANTERIC Left 03/18/2018   Procedure: INTRAMEDULLARY (IM) NAIL INTERTROCHANTRIC;  Surgeon: Roby Lofts, MD;  Location: [facility] OR;  Service: Orthopedics;  Laterality: Left;   INTRAOPERATIVE ARTERIOGRAM Left 03/24/2020   Procedure: INTRA OPERATIVE ARTERIOGRAM;  Surgeon: Cephus Shelling, MD;  Location: [facility] OR;  Service: Vascular;  Laterality: Left;    REVIEW OF SYSTEMS:   Review of Systems  Constitutional: Negative for appetite change, chills, fatigue, fever and unexpected weight change.  HENT:   Negative for mouth sores, nosebleeds, sore throat and trouble swallowing.   Eyes: Negative for eye problems and icterus.  Respiratory: Negative for cough, hemoptysis, shortness of breath and wheezing.   Cardiovascular: Negative for chest pain and leg swelling.  Gastrointestinal: Negative for abdominal pain, constipation, diarrhea, nausea and vomiting.  Genitourinary: Negative for bladder incontinence, difficulty urinating,  dysuria, frequency and hematuria.   Musculoskeletal: Negative for back pain, gait problem, neck pain and neck stiffness.  Skin: Negative for itching and rash.  Neurological: Negative for dizziness, extremity weakness, gait problem, headaches, light-headedness and seizures.  Hematological: Negative for adenopathy. Does not bruise/bleed easily.  Psychiatric/Behavioral: Negative for confusion, depression and sleep disturbance. The patient is not nervous/anxious.     PHYSICAL EXAMINATION:  There were no vitals taken for this visit.  ECOG PERFORMANCE STATUS: {CHL ONC ECOG [id number]  Physical Exam  Constitutional: Oriented to person, place, and time and well-developed, well-nourished, and in no distress. No distress.  HENT:  Head: Normocephalic and atraumatic.  Mouth/Throat: Oropharynx is clear and moist. No oropharyngeal exudate.  Eyes: Conjunctivae are normal. Right eye exhibits no discharge. Left eye exhibits no discharge. No scleral icterus.  Neck: Normal range of motion. Neck supple.  Cardiovascular: Normal rate, regular rhythm, normal heart sounds and intact distal pulses.   Pulmonary/Chest: Effort normal and breath sounds normal. No respiratory distress. No wheezes. No rales.  Abdominal: Soft. Bowel sounds are normal. Exhibits no distension and no mass. There is no tenderness.  Musculoskeletal: Normal range of motion. Exhibits no edema.  Lymphadenopathy:    No cervical adenopathy.  Neurological: Alert and oriented to person, place, and time. Exhibits normal muscle tone. Gait normal. Coordination normal.  Skin: Skin is warm and dry. No rash noted. Not diaphoretic. No erythema. No pallor.  Psychiatric: Mood, memory and judgment normal.  Vitals reviewed.  LABORATORY DATA: Lab Results  Component Value Date   WBC 6.1 06/27/2023   HGB 12.2 06/27/2023   HCT 38.4 06/27/2023   MCV 73.6 (L) 06/27/2023   PLT 213.0 06/27/2023      Chemistry      Component Value Date/Time   NA  136 06/27/2023 1524   NA 136 03/28/2023 1629   K 4.8 06/27/2023 1524   CL 99 06/27/2023 1524   CO2 26 06/27/2023 1524   BUN 28 (H) 06/27/2023 1524   BUN 15 03/28/2023 1629   CREATININE 1.47 (H) 06/27/2023 1524   CREATININE 1.41 (H) 04/13/2023 1139      Component Value Date/Time   CALCIUM 9.9 06/27/2023 1524   ALKPHOS 81 06/27/2023 1524   AST 24 06/27/2023 1524   AST 18 04/13/2023 1139   ALT 19 06/27/2023 1524   ALT 10 04/13/2023 1139   BILITOT 0.5 06/27/2023 1524   BILITOT 0.4 04/13/2023 1139       RADIOGRAPHIC STUDIES:  No results found.   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  She was treated at [facility] in 2022.  She is being seen by our clinic for iron deficiency anemia.  The patient received IV iron with Venofer 300 mg weekly x 3 last dose in October 2024.  She is currently taking **supplements and is ***  The patient was seen with Dr. Arbutus Ped today.  Labs were reviewed.  Recommend that she ***  I will arrange for additional IV iron infusions with Venofer 300 mg weekly x 3.  I will arrange for this to be performed at the [facility] infusion center.  In the meantime she is followed by gastroenterology.  She had a CT scan of the abdomen pelvis performed earlier this week.  They are pursuing this before deciding if she needs endoscopic evaluation as the patient has multiple core morbidities.  We will see the patient back for labs and a follow-up visit in 3 months for evaluation and repeat blood work.  The patient was advised to call immediately if she has any concerning symptoms in the interval. The patient voices understanding of current disease status and treatment options and is in agreement with the current care plan. All questions were answered. The patient knows to call the clinic with  any problems, questions or concerns. We can certainly see the patient much sooner if necessary   No orders of the defined types were placed in this encounter.    I spent {CHL ONC TIME VISIT - BMWUX:[id number]} counseling the patient face to face. The total time spent in the appointment was {CHL ONC TIME VISIT - ZDGUY:[id number]}.  Carrah Eppolito L Lesly Joslyn, PA-C 07/04/23

## 2023-07-05 ENCOUNTER — Ambulatory Visit (HOSPITAL_COMMUNITY)
Admission: RE | Admit: 2023-07-05 | Discharge: 2023-07-05 | Disposition: A | Discharge status: Home / Self Care | Payer: 59 | Source: Ambulatory Visit | Attending: Nurse Practitioner | Admitting: Nurse Practitioner

## 2023-07-05 DIAGNOSIS — R0989 Other specified symptoms and signs involving the circulatory and respiratory systems: Secondary | ICD-10-CM | POA: Insufficient documentation

## 2023-07-05 DIAGNOSIS — R634 Abnormal weight loss: Secondary | ICD-10-CM | POA: Insufficient documentation

## 2023-07-05 MED ORDER — IOHEXOL 300 MG/ML  SOLN
30.0000 mL | Freq: Once | INTRAMUSCULAR | Status: AC | PRN
  Administered 2023-07-05: 30 mL via ORAL

## 2023-07-05 MED ORDER — IOHEXOL 300 MG/ML  SOLN
100.0000 mL | Freq: Once | INTRAMUSCULAR | Status: AC | PRN
  Administered 2023-07-05: 80 mL via INTRAVENOUS

## 2023-07-07 ENCOUNTER — Telehealth: Payer: Self-pay | Admitting: Physician Assistant

## 2023-07-07 ENCOUNTER — Other Ambulatory Visit: Payer: 59

## 2023-07-07 ENCOUNTER — Inpatient Hospital Stay: Payer: 59 | Admitting: Physician Assistant

## 2023-07-07 ENCOUNTER — Inpatient Hospital Stay: Payer: 59

## 2023-07-07 ENCOUNTER — Ambulatory Visit: Payer: 59 | Admitting: Internal Medicine

## 2023-07-07 NOTE — Telephone Encounter (Signed)
Spoke with patient confirming upcoming appointment  

## 2023-07-07 NOTE — Progress Notes (Signed)
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  2) persistent anemia that has been going on for few years. This is likely iron deficiency anemia from gastrointestinal blood loss. The patient had no previous gastrointestinal workup or screening colonoscopy.   PRIOR THERAPY: IV iron infusions with Venofer 300 mg IV weekly x3, last dose on 05/17/23.   CURRENT THERAPY: Prescription sent for oral iron supplements  INTERVAL HISTORY: Veronica Stewart 67 y.o. female returns to the clinic today for a follow-up visit.  The patient establish care with Dr. Arbutus Ped on 04/13/23 for persistent anemia.  She had significant anemia at that time with a hemoglobin of 7.  Dr. Arbutus Ped arrange for 1 unit of blood and an IV iron infusion with Venofer 300 mg weekly x 3.  Her most recent dose was on 05/17/2023.  The patient was not aware that she should be taking an oral iron supplement daily. She would be open to trying this.    Dr. Arbutus Ped also referred the patient to gastroenterology for consideration of endoscopy and colonoscopy.  She establish care with Dr. Leonides Schanz from GI who arrange for a CT of the abdomen pelvis prior to pursuing any endoscopic evaluation.  She had this performed on 07/05/2023.  The results are not available yet at this time. Due to her multiple comorbidities if she pursues EGD and colonoscopy they recommend performing this in the hospital setting (the patient is unable to fully open her mouth secondary to prior right squamous cell carcinoma/status post resection and chemoradiation in the right nasal passage.    Overall, the patient states that she is feeling "pretty good".  She reports that her energy and fatigue is "all right".  She denies any abnormal bleeding or  bruising that she did have 1 episode of epistaxis that lasted approximately 10 minutes.  She has not had any reoccurring episodes.  She denies any shortness of breath.  Denies any lightheadedness.  She is here today for evaluation repeat blood work.  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  MEDICATIONS:  Current Outpatient Medications  Medication Sig Dispense Refill   acetaminophen (TYLENOL) 500 MG tablet Take 500 mg by mouth every 6 (six) hours as needed for mild pain.     albuterol (VENTOLIN HFA) 108 (90 Base) MCG/ACT inhaler INHALE 2 PUFFS BY MOUTH EVERY 6 HOURS AS NEEDED FOR WHEEZING AND SHORTNESS OF BREATH 8.5 g 2   amiodarone (PACERONE) 200 MG tablet TAKE 1 TABLET BY MOUTH DAILY 100 tablet 2   amLODipine (NORVASC) 10 MG tablet Take 1 tablet (10 mg total) by mouth daily. 30 tablet 1   aspirin EC 81 MG tablet Take 81 mg by mouth daily. Swallow whole.     DEEP SEA NASAL SPRAY 0.65 % nasal spray SMARTSIG:Both Nares     fluticasone (FLONASE) 50 MCG/ACT nasal spray SHAKE LIQUID AND USE 2 SPRAYS IN EACH NOSTRIL DAILY 48 g 0   Fluticasone-Salmeterol (ADVAIR) 100-50 MCG/DOSE AEPB Inhale 1 puff into the lungs 2 (two) times daily. 1 each 3  hydrALAZINE (APRESOLINE) 25 MG tablet Take 1 tablet (25 mg total) by mouth 2 (two) times daily. 180 tablet 2   metoprolol succinate (TOPROL-XL) 25 MG 24 hr tablet TAKE 1 TABLET BY MOUTH DAILY  WITH A MEAL OR IMMEDIATELY AFTER THE SAME MEAL 100 tablet 2   ondansetron (ZOFRAN) 8 MG tablet Take 8 mg by mouth 3 (three) times daily.     pantoprazole (PROTONIX) 40 MG tablet TAKE 1 TABLET(40 MG) BY MOUTH TWICE DAILY 180 tablet 3   Current Facility-Administered Medications  Medication Dose Route Frequency Provider Last Rate Last Admin   albuterol  (PROVENTIL) (2.5 MG/3ML) 0.083% nebulizer solution 2.5 mg  2.5 mg Nebulization Once [name], NP        SURGICAL HISTORY:  Past Surgical History:  Procedure Laterality Date   ABDOMINAL AORTOGRAM W/LOWER EXTREMITY Bilateral 03/05/2020   Procedure: ABDOMINAL AORTOGRAM W/LOWER EXTREMITY;  Surgeon: Cephus Shelling, MD;  Location: [facility] INVASIVE CV LAB;  Service: Cardiovascular;  Laterality: Bilateral;   ENDARTERECTOMY FEMORAL Left 03/24/2020   Procedure: LEFT COMMON FEMORAL ENDARTERECTOMY;  Surgeon: Cephus Shelling, MD;  Location: [facility] OR;  Service: Vascular;  Laterality: Left;   ENDOVEIN HARVEST OF GREATER SAPHENOUS VEIN Left 03/24/2020   Procedure: HARVEST OF GREATER SAPHENOUS VEIN;  Surgeon: Cephus Shelling, MD;  Location: [facility] OR;  Service: Vascular;  Laterality: Left;   FEMORAL-POPLITEAL BYPASS GRAFT Left 03/24/2020   Procedure: BYPASS GRAFT FEMORAL-POPLITEAL ARTERY;  Surgeon: Cephus Shelling, MD;  Location: [facility] OR;  Service: Vascular;  Laterality: Left;   INSERTION OF ILIAC STENT Left 03/24/2020   Procedure: INSERTION OF RETROGRADE ILIAC STENT;  Surgeon: Cephus Shelling, MD;  Location: [facility] OR;  Service: Vascular;  Laterality: Left;   INTRAMEDULLARY (IM) NAIL INTERTROCHANTERIC Left 03/18/2018   Procedure: INTRAMEDULLARY (IM) NAIL INTERTROCHANTRIC;  Surgeon: Roby Lofts, MD;  Location: [facility] OR;  Service: Orthopedics;  Laterality: Left;   INTRAOPERATIVE ARTERIOGRAM Left 03/24/2020   Procedure: INTRA OPERATIVE ARTERIOGRAM;  Surgeon: Cephus Shelling, MD;  Location: [facility] OR;  Service: Vascular;  Laterality: Left;    REVIEW OF SYSTEMS:   Review of Systems  Constitutional: Negative for appetite change, chills, fatigue, fever and unexpected weight change.  HENT: Positive for 1 episode of epistaxis.  Negative for mouth sores, sore throat and trouble swallowing.   Eyes: Negative for eye problems and icterus.  Respiratory: Negative for cough, hemoptysis, shortness of breath and  wheezing.   Cardiovascular: Negative for chest pain and leg swelling.  Gastrointestinal: Negative for abdominal pain, constipation, diarrhea, nausea and vomiting.  Genitourinary: Negative for bladder incontinence, difficulty urinating, dysuria, frequency and hematuria.   Musculoskeletal: Negative for back pain, gait problem, neck pain and neck stiffness.  Skin: Negative for itching and rash.  Neurological: Negative for dizziness, extremity weakness, gait problem, headaches, light-headedness and seizures.  Hematological: Negative for adenopathy. Does not bruise/bleed easily.  Psychiatric/Behavioral: Negative for confusion, depression and sleep disturbance. The patient is not nervous/anxious.     PHYSICAL EXAMINATION:  There were no vitals taken for this visit.  ECOG PERFORMANCE STATUS: 1  Physical Exam  Constitutional: Oriented to person, place, and time and well-developed, well-nourished, and in no distress.  HENT:  Head: Positive for deformity due to prior right squamous cell carcinoma/status post resection and chemoradiation in the right nasal passage. Eyes: Conjunctivae are normal. Right eye exhibits no discharge. Left eye exhibits no discharge. No scleral icterus.  Neck: Normal range of motion. Neck supple.  Cardiovascular: Normal rate, regular rhythm,  normal heart sounds and intact distal pulses.   Pulmonary/Chest: Effort normal and breath sounds normal. No respiratory distress. No wheezes. No rales.  Abdominal: Soft. Bowel sounds are normal. Exhibits no distension and no mass. There is no tenderness.  Musculoskeletal: Normal range of motion. Exhibits no edema.  Lymphadenopathy:    No cervical adenopathy.  Neurological: Alert and oriented to person, place, and time. Exhibits muscle wasting. Gait normal. Coordination normal. Ambulates with a cane.  Skin: Skin is warm and dry. No rash noted. Not diaphoretic. No erythema. No pallor.  Psychiatric: Mood, memory and judgment normal.   Vitals reviewed.  LABORATORY DATA: Lab Results  Component Value Date   WBC 6.1 06/27/2023   HGB 12.2 06/27/2023   HCT 38.4 06/27/2023   MCV 73.6 (L) 06/27/2023   PLT 213.0 06/27/2023      Chemistry      Component Value Date/Time   NA 136 06/27/2023 1524   NA 136 03/28/2023 1629   K 4.8 06/27/2023 1524   CL 99 06/27/2023 1524   CO2 26 06/27/2023 1524   BUN 28 (H) 06/27/2023 1524   BUN 15 03/28/2023 1629   CREATININE 1.47 (H) 06/27/2023 1524   CREATININE 1.41 (H) 04/13/2023 1139      Component Value Date/Time   CALCIUM 9.9 06/27/2023 1524   ALKPHOS 81 06/27/2023 1524   AST 24 06/27/2023 1524   AST 18 04/13/2023 1139   ALT 19 06/27/2023 1524   ALT 10 04/13/2023 1139   BILITOT 0.5 06/27/2023 1524   BILITOT 0.4 04/13/2023 1139       RADIOGRAPHIC STUDIES:  No results found.   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  She was treated at [facility] in 2022.  She is being seen by our clinic for iron deficiency anemia.  The patient received IV iron with Venofer 300 mg weekly x 3 last dose in October 2024.   I discussed with the patient taking iron supplements p.o. daily or every other day.  She has not tried any iron supplements in the past and denies any known intolerance.  I sent her prescription for Integra plus.  If she has any trouble with insurance or the cost of the medication, then she can try taking ferrous sulfate over-the-counter or slow release iron.  I encouraged her to take this with vitamin C.  Her ferritin is pending at this time.  We will call her and let her know she needs additional IV iron.  As of right now recommend that she take p.o. iron daily or every other day with vitamin C and recheck her labs in 1 month and labs and follow-up visit in 2 months.  We will continue to follow  with GI regarding the results of her CT of the abdomen pelvis and whether or not she needs to pursue endoscopic evaluation of her anemia.   The patient was advised to call immediately if she has any concerning symptoms in the interval. The patient voices understanding of current disease status and treatment options and is in agreement with the current care plan. All questions were answered. The patient knows to call the clinic with any problems, questions or concerns. We can certainly see the patient much sooner if necessary   No orders of the defined types were placed in this encounter.    The total time spent in the appointment  was 20-29 minutes  Veronica Krummel L Red Mandt, PA-C 07/07/23

## 2023-07-11 ENCOUNTER — Telehealth: Payer: Self-pay | Admitting: Medical Oncology

## 2023-07-11 NOTE — Telephone Encounter (Signed)
Asking about appt.

## 2023-07-11 NOTE — Telephone Encounter (Signed)
Appt confirmed with pt for tomorrow.

## 2023-07-12 ENCOUNTER — Inpatient Hospital Stay (HOSPITAL_BASED_OUTPATIENT_CLINIC_OR_DEPARTMENT_OTHER): Payer: 59 | Admitting: Physician Assistant

## 2023-07-12 ENCOUNTER — Inpatient Hospital Stay: Payer: 59 | Attending: Internal Medicine

## 2023-07-12 VITALS — BP 150/85 | HR 66 | Temp 98.3°F | Resp 17 | Wt 95.8 lb

## 2023-07-12 DIAGNOSIS — D509 Iron deficiency anemia, unspecified: Secondary | ICD-10-CM | POA: Insufficient documentation

## 2023-07-12 DIAGNOSIS — Z8709 Personal history of other diseases of the respiratory system: Secondary | ICD-10-CM | POA: Insufficient documentation

## 2023-07-12 DIAGNOSIS — C760 Malignant neoplasm of head, face and neck: Secondary | ICD-10-CM | POA: Insufficient documentation

## 2023-07-12 DIAGNOSIS — D500 Iron deficiency anemia secondary to blood loss (chronic): Secondary | ICD-10-CM

## 2023-07-12 DIAGNOSIS — Z79899 Other long term (current) drug therapy: Secondary | ICD-10-CM | POA: Insufficient documentation

## 2023-07-12 DIAGNOSIS — Z9221 Personal history of antineoplastic chemotherapy: Secondary | ICD-10-CM | POA: Diagnosis not present

## 2023-07-12 LAB — IRON AND IRON BINDING CAPACITY (CC-WL,HP ONLY)
Iron: 46 ug/dL (ref 28–170)
Saturation Ratios: 20 % (ref 10.4–31.8)
TIBC: 235 ug/dL — ABNORMAL LOW (ref 250–450)
UIBC: 189 ug/dL (ref 148–442)

## 2023-07-12 LAB — CBC WITH DIFFERENTIAL (CANCER CENTER ONLY)
Abs Immature Granulocytes: 0.01 10*3/uL (ref 0.00–0.07)
Basophils Absolute: 0 10*3/uL (ref 0.0–0.1)
Basophils Relative: 0 %
Eosinophils Absolute: 0.2 10*3/uL (ref 0.0–0.5)
Eosinophils Relative: 3 %
HCT: 34.5 % — ABNORMAL LOW (ref 36.0–46.0)
Hemoglobin: 10.7 g/dL — ABNORMAL LOW (ref 12.0–15.0)
Immature Granulocytes: 0 %
Lymphocytes Relative: 18 %
Lymphs Abs: 1 10*3/uL (ref 0.7–4.0)
MCH: 23.9 pg — ABNORMAL LOW (ref 26.0–34.0)
MCHC: 31 g/dL (ref 30.0–36.0)
MCV: 77 fL — ABNORMAL LOW (ref 80.0–100.0)
Monocytes Absolute: 0.7 10*3/uL (ref 0.1–1.0)
Monocytes Relative: 14 %
Neutro Abs: 3.4 10*3/uL (ref 1.7–7.7)
Neutrophils Relative %: 65 %
Platelet Count: 221 10*3/uL (ref 150–400)
RBC: 4.48 MIL/uL (ref 3.87–5.11)
RDW: 28.9 % — ABNORMAL HIGH (ref 11.5–15.5)
WBC Count: 5.3 10*3/uL (ref 4.0–10.5)
nRBC: 0 % (ref 0.0–0.2)

## 2023-07-12 LAB — FERRITIN: Ferritin: 141 ng/mL (ref 11–307)

## 2023-07-12 MED ORDER — INTEGRA PLUS PO CAPS: 1.0000 | ORAL_CAPSULE | Freq: Every morning | ORAL | 2 refills | Status: AC

## 2023-07-13 ENCOUNTER — Ambulatory Visit: Payer: 59 | Admitting: Internal Medicine

## 2023-07-13 ENCOUNTER — Other Ambulatory Visit: Payer: 59

## 2023-07-20 ENCOUNTER — Telehealth: Payer: Self-pay

## 2023-07-20 NOTE — Telephone Encounter (Signed)
Spoke with pt in regards to iron results.  Per Cassie, PA- No IV iron at this time. Continue the oral iron prescription.  Pt verbalized understanding. Schedule message will be sent with f/u appts per Cassie's LOS from 11/26.

## 2023-07-22 ENCOUNTER — Other Ambulatory Visit: Payer: Self-pay | Admitting: Cardiology

## 2023-07-26 MED ORDER — AMLODIPINE BESYLATE 10 MG PO TABS: 10.0000 mg | ORAL_TABLET | Freq: Every day | ORAL | 3 refills | Status: DC

## 2023-08-01 ENCOUNTER — Other Ambulatory Visit: Payer: Self-pay

## 2023-08-01 DIAGNOSIS — D509 Iron deficiency anemia, unspecified: Secondary | ICD-10-CM

## 2023-08-01 DIAGNOSIS — R634 Abnormal weight loss: Secondary | ICD-10-CM

## 2023-08-15 ENCOUNTER — Telehealth (INDEPENDENT_AMBULATORY_CARE_PROVIDER_SITE_OTHER): Payer: Self-pay | Admitting: Primary Care

## 2023-08-15 NOTE — Telephone Encounter (Signed)
Medication Refill -  Most Recent Primary Care Visit:  Provider: [name]  Department: [facility]  Visit Type: OFFICE VISIT  Date: 04/26/2023  Medication: LORATADINE 10 MG  Has the patient contacted their pharmacy? Yes    Is this the correct pharmacy for this prescription? Yes If no, delete pharmacy and type the correct one.  This is the patient's preferred pharmacy:  [id number] [place] - [address] AT [facility] [address] [place] [address] Phone: [phone] Fax: [phone]  [facility] [place] [place] - [address] [place] [address] Phone: [phone] Fax: [phone]   Has the prescription been filled recently? Yes  Is the patient out of the medication? Yes  Has the patient been seen for an appointment in the last year OR does the patient have an upcoming appointment? Yes  Can we respond through MyChart? No  Agent: Please be advised that Rx refills may take up to 3 business days. We ask that you follow-up with your pharmacy.

## 2023-08-16 ENCOUNTER — Other Ambulatory Visit (INDEPENDENT_AMBULATORY_CARE_PROVIDER_SITE_OTHER): Payer: Self-pay | Admitting: Primary Care

## 2023-08-16 DIAGNOSIS — J301 Allergic rhinitis due to pollen: Secondary | ICD-10-CM

## 2023-08-16 NOTE — Telephone Encounter (Signed)
Will forward to provider  

## 2023-08-22 ENCOUNTER — Inpatient Hospital Stay: Payer: 59 | Attending: Internal Medicine

## 2023-08-22 DIAGNOSIS — D509 Iron deficiency anemia, unspecified: Secondary | ICD-10-CM | POA: Insufficient documentation

## 2023-08-22 LAB — CBC WITH DIFFERENTIAL (CANCER CENTER ONLY)
Abs Immature Granulocytes: 0.01 10*3/uL (ref 0.00–0.07)
Basophils Absolute: 0 10*3/uL (ref 0.0–0.1)
Basophils Relative: 1 %
Eosinophils Absolute: 0.2 10*3/uL (ref 0.0–0.5)
Eosinophils Relative: 4 %
HCT: 37.1 % (ref 36.0–46.0)
Hemoglobin: 11.9 g/dL — ABNORMAL LOW (ref 12.0–15.0)
Immature Granulocytes: 0 %
Lymphocytes Relative: 18 %
Lymphs Abs: 1 10*3/uL (ref 0.7–4.0)
MCH: 26.1 pg (ref 26.0–34.0)
MCHC: 32.1 g/dL (ref 30.0–36.0)
MCV: 81.4 fL (ref 80.0–100.0)
Monocytes Absolute: 0.5 10*3/uL (ref 0.1–1.0)
Monocytes Relative: 9 %
Neutro Abs: 3.7 10*3/uL (ref 1.7–7.7)
Neutrophils Relative %: 68 %
Platelet Count: 278 10*3/uL (ref 150–400)
RBC: 4.56 MIL/uL (ref 3.87–5.11)
RDW: 23.3 % — ABNORMAL HIGH (ref 11.5–15.5)
WBC Count: 5.5 10*3/uL (ref 4.0–10.5)
nRBC: 0 % (ref 0.0–0.2)

## 2023-08-22 LAB — IRON AND IRON BINDING CAPACITY (CC-WL,HP ONLY)
Iron: 44 ug/dL (ref 28–170)
Saturation Ratios: 19 % (ref 10.4–31.8)
TIBC: 232 ug/dL — ABNORMAL LOW (ref 250–450)
UIBC: 188 ug/dL (ref 148–442)

## 2023-08-22 LAB — SAMPLE TO BLOOD BANK

## 2023-08-23 LAB — FERRITIN: Ferritin: 70 ng/mL (ref 11–307)

## 2023-09-05 ENCOUNTER — Other Ambulatory Visit: Payer: Self-pay | Admitting: Cardiology

## 2023-09-08 ENCOUNTER — Encounter (HOSPITAL_COMMUNITY): Payer: Self-pay | Admitting: Internal Medicine

## 2023-09-08 NOTE — Progress Notes (Signed)
Attempted to obtain medical history for pre op call via telephone, unable to reach at this time. HIPAA compliant voicemail message left requesting return call to pre surgical testing department.

## 2023-09-13 NOTE — Progress Notes (Signed)
  Pre op call eval Name:Veronica Stewart Heading NP CardiologistBjorn Pippin MD  EKG-03/03/23 Echo-03/28/23 Stress Test-n/a Cath-n/a Blood thinner-n/a GLP-1-n/a  Hx: HTN,Asthma, Afib, PAD s/p femoral/popliteal bypass, IDA. Last saw cardiology 05/20/23, has appt coming up on 09/19/23. At last appt MD did speak about referring her to see someone about an ablation, per pt hasn't seen anyone dont see any appts made. She also reported no new cardiac/breathing issues, uses cane. Anesthesia Review: Yes

## 2023-09-14 ENCOUNTER — Telehealth: Payer: Self-pay | Admitting: Nurse Practitioner

## 2023-09-14 NOTE — Telephone Encounter (Signed)
Patient has been scheduled for a procedure at the hospital tomorrow and stated she has no prep.

## 2023-09-14 NOTE — Telephone Encounter (Signed)
Contacted patient and made patient aware that she was doing the Miralax prep for her colonoscopy and that everything she needed was over the counter. Patient verbalized understanding.

## 2023-09-14 NOTE — Anesthesia Preprocedure Evaluation (Signed)
Anesthesia Evaluation    Reviewed: Allergy & Precautions, Patient's Chart, lab work & pertinent test results, reviewed documented beta blocker date and time   Airway        Dental   Pulmonary asthma , Current Smoker          Cardiovascular hypertension, Pt. on medications and Pt. on home beta blockers pulmonary hypertension (mod pHTN on echo 2024)+ Peripheral Vascular Disease  + dysrhythmias Atrial Fibrillation + Valvular Problems/Murmurs (mod-severe TR, mild MR) MR   Echo 2024  1. Left ventricular ejection fraction, by estimation, is 60 to 65%. The  left ventricle has normal function. The left ventricle has no regional  wall motion abnormalities. Left ventricular diastolic parameters were  normal. The average left ventricular  global longitudinal strain is 20.9 %. The global longitudinal strain is  normal.   2. Right ventricular systolic function is normal. The right ventricular  size is normal. There is moderately elevated pulmonary artery systolic  pressure. The estimated right ventricular systolic pressure is 58.1 mmHg.   3. Left atrial size was moderately dilated.   4. Right atrial size was moderately dilated.   5. The mitral valve is normal in structure. Mild mitral valve  regurgitation. No evidence of mitral stenosis.   6. Tricuspid valve regurgitation is moderate to severe.   7. The aortic valve is tricuspid. Aortic valve regurgitation is not  visualized. No aortic stenosis is present.   8. The inferior vena cava is normal in size with greater than 50%  respiratory variability, suggesting right atrial pressure of 3 mmHg.     Neuro/Psych negative neurological ROS  negative psych ROS   GI/Hepatic negative GI ROS, Neg liver ROS,,,  Endo/Other  negative endocrine ROS    Renal/GU negative Renal ROS  negative genitourinary   Musculoskeletal negative musculoskeletal ROS (+)    Abdominal   Peds  Hematology  (+)  Blood dyscrasia, anemia   Anesthesia Other Findings   Reproductive/Obstetrics negative OB ROS                              Anesthesia Physical Anesthesia Plan  ASA: 4  Anesthesia Plan: MAC   Post-op Pain Management:    Induction:   PONV Risk Score and Plan: 2 and Propofol infusion and TIVA  Airway Management Planned: Natural Airway and Simple Face Mask  Additional Equipment: None  Intra-op Plan:   Post-operative Plan:   Informed Consent:   Plan Discussed with:   Anesthesia Plan Comments:          Anesthesia Quick Evaluation

## 2023-09-15 ENCOUNTER — Telehealth: Payer: Self-pay | Admitting: Internal Medicine

## 2023-09-15 NOTE — Op Note (Addendum)
[facility] Patient Name: Veronica Stewart Procedure Date: 09/15/2023 MRN: [id number] Attending MD: Particia Lather , , [id number] Date of Birth: Feb 26, 1956 CSN: [id number] Age: 68 Admit Type:  THIS EXAM WAS SENT IN ERROR

## 2023-09-15 NOTE — Progress Notes (Signed)
 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  2) persistent anemia that has been going on for few years. This is likely iron  deficiency anemia from gastrointestinal blood loss. The patient had no previous gastrointestinal workup or screening colonoscopy.   PRIOR THERAPY: IV iron  infusions with Venofer  300 mg IV weekly x3, last dose on 05/17/23.   CURRENT THERAPY: Oral iron  supplements with integra plus  p.o. daily or every other day  INTERVAL HISTORY: Veronica Stewart 68 y.o. female returns to the clinic today for a follow-up visit.  The patient establish care with Dr. Sherrod on 04/13/23 for persistent anemia.  She had significant anemia at that time with a hemoglobin of 7.  Dr. Sherrod arrange for 1 unit of blood and an IV iron  infusion with Venofer  300 mg weekly x 3.  Her most recent dose was on 05/17/2023.  At her follow up on 07/11/24, the patient was not aware that she should be taking an oral iron  supplement daily. Therefore, she was sent in a prescription for iron . She started taking this and is compliant. She takes this daily/every other day if she has constipation.    She underwent colonoscopy and EGD yesterday under the care of Dr. Federico. Her colonoscopy showed 3 polyps, diverticulosis, and internal hemorrhoids.  Her EGD showed benign-appearing esophageal stenosis, hiatal hernia, and gastritis.  Overall, the patient states that she is feeling fine today without concerns. Regarding her energy and dyspnea on exertion, she sometimes may need to sit down and rest in between activities at home.. She denies any abnormal bleeding or bruising except she occasionally may have a self limiting episode of epistaxis that lasted approximately 5 minutes. Denies any  lightheadedness. She is here today for evaluation and repeat blood work.     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  MEDICATIONS:  Current Outpatient Medications  Medication Sig Dispense Refill   acetaminophen  (TYLENOL ) 500 MG tablet Take 500 mg by mouth every 6 (six) hours as needed for mild pain.     acetaminophen  (TYLENOL ) 650 MG CR tablet Take 650 mg by mouth every 8 (eight) hours as needed for pain.     albuterol  (VENTOLIN  HFA) 108 (90 Base) MCG/ACT inhaler INHALE 2 PUFFS BY MOUTH EVERY 6 HOURS AS NEEDED FOR WHEEZING AND SHORTNESS OF BREATH 8.5 g 2   amiodarone  (PACERONE ) 200 MG tablet TAKE 1 TABLET BY MOUTH DAILY 100 tablet 2   amLODipine  (NORVASC ) 10 MG tablet Take 1 tablet (10 mg total) by mouth daily. 90 tablet 3   aspirin  EC 81 MG tablet Take 81 mg by mouth daily. Swallow whole.     DEEP SEA NASAL SPRAY 0.65 % nasal spray SMARTSIG:Both Nares     FeFum-FePoly-FA-B Cmp-C-Biot (INTEGRA PLUS ) CAPS Take 1 capsule by mouth every morning. 30 capsule 2   fluticasone  (FLONASE ) 50 MCG/ACT nasal spray SHAKE LIQUID AND USE 2 SPRAYS IN EACH NOSTRIL DAILY 48 g 0   Fluticasone -Salmeterol (ADVAIR) 100-50 MCG/DOSE AEPB Inhale 1 puff into the lungs 2 (two) times daily. 1 each  3   hydrALAZINE  (APRESOLINE ) 25 MG tablet TAKE 1 TABLET BY MOUTH TWICE  DAILY 200 tablet 2   metoprolol  succinate (TOPROL -XL) 25 MG 24 hr tablet TAKE 1 TABLET BY MOUTH DAILY  WITH A MEAL OR IMMEDIATELY AFTER THE SAME MEAL 100 tablet 2   ondansetron  (ZOFRAN ) 8 MG tablet Take 8 mg by mouth 3 (three) times daily.     pantoprazole  (PROTONIX ) 40 MG tablet TAKE 1 TABLET(40 MG) BY MOUTH TWICE DAILY 180 tablet 3   Current Facility-Administered Medications  Medication Dose Route Frequency Provider Last Rate Last  Admin   albuterol  (PROVENTIL ) (2.5 MG/3ML) 0.083% nebulizer solution 2.5 mg  2.5 mg Nebulization Once Celestia Rosaline SQUIBB, NP        SURGICAL HISTORY:  Past Surgical History:  Procedure Laterality Date   ABDOMINAL AORTOGRAM W/LOWER EXTREMITY Bilateral 03/05/2020   Procedure: ABDOMINAL AORTOGRAM W/LOWER EXTREMITY;  Surgeon: Gretta Lonni PARAS, MD;  Location: [facility];  Service: Cardiovascular;  Laterality: Bilateral;   ENDARTERECTOMY FEMORAL Left 03/24/2020   Procedure: LEFT COMMON FEMORAL ENDARTERECTOMY;  Surgeon: Gretta Lonni PARAS, MD;  Location: [facility] OR;  Service: Vascular;  Laterality: Left;   ENDOVEIN HARVEST OF GREATER SAPHENOUS VEIN Left 03/24/2020   Procedure: HARVEST OF GREATER SAPHENOUS VEIN;  Surgeon: Gretta Lonni PARAS, MD;  Location: [facility] OR;  Service: Vascular;  Laterality: Left;   FEMORAL-POPLITEAL BYPASS GRAFT Left 03/24/2020   Procedure: BYPASS GRAFT FEMORAL-POPLITEAL ARTERY;  Surgeon: Gretta Lonni PARAS, MD;  Location: [facility] OR;  Service: Vascular;  Laterality: Left;   INSERTION OF ILIAC STENT Left 03/24/2020   Procedure: INSERTION OF RETROGRADE ILIAC STENT;  Surgeon: Gretta Lonni PARAS, MD;  Location: [facility] OR;  Service: Vascular;  Laterality: Left;   INTRAMEDULLARY (IM) NAIL INTERTROCHANTERIC Left 03/18/2018   Procedure: INTRAMEDULLARY (IM) NAIL INTERTROCHANTRIC;  Surgeon: Kendal Franky SQUIBB, MD;  Location: [facility] OR;  Service: Orthopedics;  Laterality: Left;   INTRAOPERATIVE ARTERIOGRAM Left 03/24/2020   Procedure: INTRA OPERATIVE ARTERIOGRAM;  Surgeon: Gretta Lonni PARAS, MD;  Location: [facility] OR;  Service: Vascular;  Laterality: Left;    REVIEW OF SYSTEMS:   Review of Systems  Constitutional: Positive for occasional fatigue. Negative for appetite change, chills, fever and unexpected weight change.  HENT: Positive for occasional self limiting nosebleed. Negative for mouth sores, sore throat and trouble swallowing.   Eyes: Negative for eye problems and icterus.  Respiratory:  Positive for occasional dyspnea on exertion. Negative for cough, hemoptysis, and wheezing.   Cardiovascular: Negative for chest pain and leg swelling.  Gastrointestinal: Positive for occasional constipation. Negative for abdominal pain, diarrhea, nausea and vomiting.  Genitourinary: Negative for bladder incontinence, difficulty urinating, dysuria, frequency and hematuria.   Musculoskeletal: Negative for back pain, gait problem, neck pain and neck stiffness.  Skin: Negative for itching and rash.  Neurological: Negative for dizziness, extremity weakness, gait problem, headaches, light-headedness and seizures.  Hematological: Negative for adenopathy. Does not bruise/bleed easily.  Psychiatric/Behavioral: Negative for confusion, depression and sleep disturbance. The patient is not nervous/anxious.     PHYSICAL EXAMINATION:  Blood pressure 113/75, pulse (!) 57, temperature 98.1 F (36.7 C), temperature source Temporal, resp. rate 16, height 5' 9 (1.753 m), weight 108 lb 8 oz (49.2 kg), SpO2 93%.  ECOG PERFORMANCE STATUS: 1  Physical Exam  Constitutional: Oriented to person, place, and time and thin appearing female and in no distress.  HENT:  Head: Positive for deformity due to prior right squamous cell carcinoma/status post resection and chemoradiation in the right nasal passage.  Eyes: Conjunctivae are normal. Right eye exhibits no discharge. Left eye exhibits no discharge. No scleral icterus.  Neck: Normal range of motion. Neck supple.  Cardiovascular: Normal rate, regular rhythm, normal heart sounds and intact distal pulses.   Pulmonary/Chest: Effort normal and breath sounds normal. No respiratory distress. No wheezes. No rales.  Abdominal: Soft. Bowel sounds are normal. Exhibits no distension and no mass. There is no tenderness.  Musculoskeletal: Normal range of motion. Exhibits no edema.  Lymphadenopathy:    No cervical adenopathy.  Neurological: Alert and oriented to person, place, and  time. Exhibits muscle wasting. Gait normal. Coordination normal. Ambulates with a cane.  Skin: Skin is warm and dry. No rash noted. Not diaphoretic. No erythema. No pallor.  Psychiatric: Mood, memory and judgment normal.  Vitals reviewed.  LABORATORY DATA: Lab Results  Component Value Date   WBC 5.1 09/20/2023   HGB 12.8 09/20/2023   HCT 40.6 09/20/2023   MCV 86.0 09/20/2023   PLT 265 09/20/2023      Chemistry      Component Value Date/Time   NA 136 06/27/2023 1524   NA 136 03/28/2023 1629   K 4.8 06/27/2023 1524   CL 99 06/27/2023 1524   CO2 26 06/27/2023 1524   BUN 28 (H) 06/27/2023 1524   BUN 15 03/28/2023 1629   CREATININE 1.47 (H) 06/27/2023 1524   CREATININE 1.41 (H) 04/13/2023 1139      Component Value Date/Time   CALCIUM  9.9 06/27/2023 1524   ALKPHOS 81 06/27/2023 1524   AST 24 06/27/2023 1524   AST 18 04/13/2023 1139   ALT 19 06/27/2023 1524   ALT 10 04/13/2023 1139   BILITOT 0.5 06/27/2023 1524   BILITOT 0.4 04/13/2023 1139       RADIOGRAPHIC STUDIES:  No results found.   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  She was treated at [facility] in 2022.   She is being seen by our clinic for iron  deficiency anemia.   The patient received IV iron  with Venofer  300 mg weekly x 3 last dose in October 2024.   She is currently taking oral iron  supplements with integra plus  and she is compliant with taking this either daily or every other day.    Labs were reviewed. Her Hbg is 12.8. Her iron  studies are WNL. The ferritin is pending.   We will call her and let her know she needs additional IV iron .  As of right now recommend that she take p.o. iron  daily or every other day with vitamin C and follow-up visit in 3-4 months. If her ferritin comes back low, we may consider her for IV iron .     The patient was advised to call immediately if she has any concerning symptoms in the interval. The patient voices understanding of current disease status and treatment options and is in agreement with the current care plan. All questions were answered. The patient knows to call the clinic with any problems, questions or concerns. We can certainly see the patient much sooner if necessary    Orders Placed This Encounter  Procedures   CBC with Differential (Cancer Center Only)    Standing Status:   Future    Expected Date:   01/03/2024    Expiration Date:   09/19/2024   Ferritin    Standing Status:   Future    Expected  Date:   01/03/2024    Expiration Date:   09/19/2024   Iron  and Iron  Binding Capacity ([facility] only)    Standing Status:   Future    Expected Date:   01/03/2024    Expiration Date:   09/19/2024    The total time spent in the appointment was 20-29 minutes  Rashada Klontz L Starkeisha Vanwinkle, PA-C 09/20/23

## 2023-09-15 NOTE — Telephone Encounter (Signed)
Patient called and stated that she got to the hospital and they told her she was late arriving and she needed to call us to get her reschedule for her procedure. Patient is requesting a call back. Please advise.

## 2023-09-18 NOTE — Progress Notes (Unsigned)
Cardiology Office Note:    Date:  09/20/2023   ID:  Veronica Stewart, DOB 03-10-1956, MRN [id number]  PCP:  [name], NP  Cardiologist:  Little Ishikawa, MD  Electrophysiologist:  None   Referring MD: [name], NP   Chief Complaint  Patient presents with   Atrial Flutter     History of Present Illness:    Veronica Stewart is a 68 y.o. female with a hx of atrial flutter, PAD status post stent pop bypass, hypertension, asthma who presents for follow-up.  She was admitted to [facility] on 02/11/2021 with epistaxis and new onset atrial flutter.  Anticoagulation was initially held due to her epistaxis and she was evaluated by ENT.  Underwent packing in epistaxis resolved, and she was started on Eliquis, which he tolerated well.  She had been on aspirin/Plavix for her PAD given her left external iliac stenting in August 2021.  She was transitioned to Eliquis monotherapy.  Echocardiogram 02/13/2020 showed EF 40 to 45%.  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  She completed adjuvant chemotherapy and radiation.  Echocardiogram 02/22/2022 showed EF 55 to 60%, normal diastolic function, normal RV function, RVSP 41 mmHg, mild MR, mild to moderate TR.  Zio patch was worn for 9 days 02/2022 the only had 1 day of analysis time, no significant arrhythmias were seen.  Echocardiogram 03/28/2023 showed EF 60 to 65%, normal RV function, moderately elevated pulmonary pressures, moderate biatrial enlargement, no significant valvular disease.   Since last clinic visit, she reports she is doing okay.  Denies any chest pain, dyspnea, lightheadedness, syncope, or palpitations.  Does report some lower extremity edema.  She had a fall but no injury.  She is smoking 7 to 8 cigarettes/day.  Past Medical History:  Diagnosis Date   Asthma    Atrial  fibrillation (HCC)    Dermatophytosis of nail    Hypertension    Oropharyngeal dysphagia    Squamous cell carcinoma of nasal cavity [facility])     Past Surgical History:  Procedure Laterality Date   ABDOMINAL AORTOGRAM W/LOWER EXTREMITY Bilateral 03/05/2020   Procedure: ABDOMINAL AORTOGRAM W/LOWER EXTREMITY;  Surgeon: Cephus Shelling, MD;  Location: [facility] INVASIVE CV LAB;  Service: Cardiovascular;  Laterality: Bilateral;   ENDARTERECTOMY FEMORAL Left 03/24/2020   Procedure: LEFT COMMON FEMORAL ENDARTERECTOMY;  Surgeon: Cephus Shelling, MD;  Location: [facility] OR;  Service: Vascular;  Laterality: Left;   ENDOVEIN HARVEST OF GREATER SAPHENOUS VEIN Left 03/24/2020   Procedure: HARVEST OF GREATER SAPHENOUS VEIN;  Surgeon: Cephus Shelling, MD;  Location: [facility] OR;  Service: Vascular;  Laterality: Left;   FEMORAL-POPLITEAL BYPASS GRAFT Left 03/24/2020   Procedure: BYPASS GRAFT FEMORAL-POPLITEAL ARTERY;  Surgeon: Cephus Shelling, MD;  Location: [facility] OR;  Service: Vascular;  Laterality: Left;   INSERTION OF ILIAC STENT Left 03/24/2020   Procedure: INSERTION OF RETROGRADE ILIAC STENT;  Surgeon: Cephus Shelling, MD;  Location: [facility] OR;  Service: Vascular;  Laterality: Left;   INTRAMEDULLARY (IM) NAIL INTERTROCHANTERIC Left 03/18/2018   Procedure: INTRAMEDULLARY (IM) NAIL INTERTROCHANTRIC;  Surgeon: Roby Lofts, MD;  Location: [facility] OR;  Service: Orthopedics;  Laterality: Left;   INTRAOPERATIVE ARTERIOGRAM Left 03/24/2020   Procedure: INTRA OPERATIVE ARTERIOGRAM;  Surgeon: Cephus Shelling, MD;  Location: [facility] OR;  Service: Vascular;  Laterality: Left;    Current Medications: Current Meds  Medication  Sig   acetaminophen (TYLENOL) 650 MG CR tablet Take 650 mg by mouth every 8 (eight) hours as needed for pain.   albuterol (VENTOLIN HFA) 108 (90 Base) MCG/ACT inhaler INHALE 2 PUFFS BY MOUTH EVERY 6 HOURS AS NEEDED FOR WHEEZING AND SHORTNESS OF BREATH   amiodarone (PACERONE) 200 MG tablet TAKE 1 TABLET BY  MOUTH DAILY   amLODipine (NORVASC) 10 MG tablet Take 1 tablet (10 mg total) by mouth daily.   aspirin EC 81 MG tablet Take 81 mg by mouth daily. Swallow whole.   DEEP SEA NASAL SPRAY 0.65 % nasal spray SMARTSIG:Both Nares   FeFum-FePoly-FA-B Cmp-C-Biot (INTEGRA PLUS) CAPS Take 1 capsule by mouth every morning.   fluticasone (FLONASE) 50 MCG/ACT nasal spray SHAKE LIQUID AND USE 2 SPRAYS IN EACH NOSTRIL DAILY   Fluticasone-Salmeterol (ADVAIR) 100-50 MCG/DOSE AEPB Inhale 1 puff into the lungs 2 (two) times daily.   hydrALAZINE (APRESOLINE) 25 MG tablet TAKE 1 TABLET BY MOUTH TWICE  DAILY   metoprolol succinate (TOPROL-XL) 25 MG 24 hr tablet TAKE 1 TABLET BY MOUTH DAILY  WITH A MEAL OR IMMEDIATELY AFTER THE SAME MEAL   pantoprazole (PROTONIX) 40 MG tablet TAKE 1 TABLET(40 MG) BY MOUTH TWICE DAILY   Current Facility-Administered Medications for the 09/19/23 encounter (Office Visit) with Little Ishikawa, MD  Medication   albuterol (PROVENTIL) (2.5 MG/3ML) 0.083% nebulizer solution 2.5 mg     Allergies:   Patient has no known allergies.   Social History   Socioeconomic History   Marital status: Legally Separated    Spouse name: Not on file   Number of children: Not on file   Years of education: Not on file   Highest education level: Not on file  Occupational History   Not on file  Tobacco Use   Smoking status: Every Day    Current packs/day: 0.50    Types: Cigarettes   Smokeless tobacco: Never  Vaping Use   Vaping status: Never Used  Substance and Sexual Activity   Alcohol use: Yes    Comment: two 40 oz beers most days    Drug use: Not Currently    Types: Marijuana   Sexual activity: Not on file  Other Topics Concern   Not on file  Social History Narrative   Not on file   Social Drivers of Health   Financial Resource Strain: Not on file  Food Insecurity: Not on file  Transportation Needs: Not on file  Physical Activity: Not on file  Stress: Not on file  Social  Connections: Not on file     Family History: [name] includes Breast cancer in her paternal aunt. There is no history of Sudden Cardiac Death.  ROS:   Please see the history of present illness.      All other systems reviewed and are negative.  EKGs/Labs/Other Studies Reviewed:    The following studies were reviewed today:   EKG:   09/19/23: Sinus bradycardia, first-degree AV block, rate 54 03/03/2023: Sinus bradycardia, rate 58, poor R wave progression, no ST abnormalities, low voltage 03/23/22: Sinus rhythm, rate 60, first-degree AV block, poor R wave progression, Q waves in V1-3 02/09/2022: Sinus bradycardia, rate 55, Q waves in V1-3 05/11/21: Normal sinus rhythm, rate 73, Q waves V1-4 07/22:sinus rhythm, rate 63, Q-wave V1-V4  Recent Labs: 03/28/2023: TSH 2.250 06/27/2023: ALT 19; BUN 28; Creatinine, Ser 1.47; Potassium 4.8; Sodium 136 09/20/2023: Hemoglobin 12.8; Platelet Count 265  Recent Lipid Panel    Component  Value Date/Time   CHOL 89 03/25/2020 0216   CHOL 173 03/26/2019 1450   TRIG 31 03/25/2020 0216   HDL 44 03/25/2020 0216   HDL 102 03/26/2019 1450   CHOLHDL 2.0 03/25/2020 0216   VLDL 6 03/25/2020 0216   LDLCALC 39 03/25/2020 0216   LDLCALC 48 03/26/2019 1450    Physical Exam:    VS:  BP 128/84 (BP Location: Left Arm, Patient Position: Sitting)   Pulse (!) 54   Ht 5\' 9"  (1.753 m)   Wt 109 lb (49.4 kg)   SpO2 91%   BMI 16.10 kg/m     Wt Readings from Last 3 Encounters:  09/20/23 108 lb 8 oz (49.2 kg)  09/19/23 109 lb (49.4 kg)  07/12/23 95 lb 12.8 oz (43.5 kg)     GEN: Cachectic, in no acute distress NECK: No JVD CARDIAC: RRR, no murmurs, rubs, gallops RESPIRATORY:  Clear to auscultation without rales, wheezing or rhonchi  ABDOMEN: Soft, non-tender, non-distended MUSCULOSKELETAL:  2+ LLE edema SKIN: Warm and dry NEUROLOGIC:  Alert and oriented x 3 PSYCHIATRIC:  Normal affect   ASSESSMENT:    1. Atrial flutter, unspecified  type (HCC)   2. Left leg swelling   3. Essential hypertension   4. Hyperlipidemia, unspecified hyperlipidemia type   5. Iron deficiency anemia, unspecified iron deficiency anemia type   6. Tobacco use      PLAN:    Atrial Flutter: Noted during admission 01/2021, rates up to 140s.  CHADSVASc score 4 (CHF, HTN, PAD, female).  Echo 02/12/21 shows EF 40 to 45%.  Echocardiogram 02/22/2022 showed EF 55 to 60%, normal diastolic function, normal RV function, RVSP 41 mmHg, mild MR, mild to moderate TR.  Zio patch was worn for 9 days 02/2022 the only had 1 day of analysis time, no significant arrhythmias were seen. -Given she was going in and out of atrial flutter during admission, was started on amiodarone to maintain sinus rhythm.  Appears to be maintaining sinus rhythm on amiodarone 200 mg daily.  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  Referred to EP to evaluate for ablation, recommended continuing amiodarone at this time -Holding Eliquis in setting of anemia -Continue Toprol-XL 25 mg daily   Chronic combined systolic and diastolic heart failure: Echo 02/12/2021 shows EF 40 to 45%.  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  Echocardiogram 02/22/2022 showed EF 55 to 60%, normal diastolic function, normal RV function, RVSP 41 mmHg, mild MR, mild to moderate TR.  Echocardiogram 03/28/2023 showed EF 60 to 65%, normal RV function, moderately elevated pulmonary pressures, moderate biatrial enlargement, no significant valvular disease. 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  Recommended holding off and continuing amiodarone -Continue Toprol-XL 25 mg daily  Anemia: Hemoglobin 7.9 on 03/28/2023, down from 12.0 01/2022.  Iron studies consistent with iron deficiency  anemia.  Referred to GI and hematology.  Started on IV iron.  Planning endoscopy with GI.  Hemoglobin improved to 11.9 on 08/22/2023   Left lower extremity edema: Asymmetric left lower extremity edema on exam today -Check left lower extremity duplex to rule out DVT -Check BNP, CMET -Recommend compression stockings  HTN:  started lisinopril-HCTZ 20-25 mg daily but developed AKI and was discontinued.  Currently on amlodipine 10 mg daily, hydralazine 25 mg 2 times daily and Toprol-XL 25 mg  daily.  Appears controlled   PAD s/p fem-pop bypass: with left external iliac stenting 8/21. Has been on ASA/plavix since that time.  During admission in June 2022, aspirin/Plavix were discontinued and switched to Eliquis monotherapy given her new atrial flutter as above.  Continue atorvastatin.  Eliquis currently on hold as above, restarted ASA 81 mg daily.  Continue statin  Hyperlipidemia: LDL 65 on 08/18/2021.  Continue atorvastatin 20 mg daily.  Check lipid panel  Tobacco use: Counseled risk of tobacco use and cessation strongly encouraged.    AKI: Creatinine up to 1.76 on 02/22/2022.  Likely due to lisinopril-HCTZ.  Medication discontinued, improvement in creatinine to 1.17 on 03/15/2022.  Most recent creatinine 1.41 on 04/13/2023.  Check CMET   RTC in 4 months   Medication Adjustments/Labs and Tests Ordered: Current medicines are reviewed at length with the patient today.  Concerns regarding medicines are outlined above.  Orders Placed This Encounter  Procedures   Comprehensive Metabolic Panel (CMET)   Brain natriuretic peptide   TSH   Lipid panel   EKG 12-Lead   VAS [place] LOWER EXTREMITY VENOUS (DVT)     No orders of the defined types were placed in this encounter.     Patient Instructions  Medication Instructions:  Continue current medication *If you need a refill on your cardiac medications before your next appointment, please call your pharmacy*   Lab Work:  tsh, lipid, bnp, cmet If you  have labs (blood work) drawn today and your tests are completely normal, you will receive your results only by: MyChart Message (if you have MyChart) OR A paper copy in the mail If you have any lab test that is abnormal or we need to change your treatment, we will call you to review the results.   Testing/Procedures: Your physician has requested that you have a lower extremity venous duplex. This test is an ultrasound of the veins in the legs. It looks at venous blood flow that carries blood from the heart to the legs or arms. Allow one hour for a Lower Venous exam. There are no restrictions or special instructions.  Please note: We ask at that you not bring children with you during ultrasound (echo/ vascular) testing. Due to room size and safety concerns, children are not allowed in the ultrasound rooms during exams. Our front office staff cannot provide observation of children in our lobby area while testing is being conducted. An adult accompanying a patient to their appointment will only be allowed in the ultrasound room at the discretion of the ultrasound technician under special circumstances. We apologize for any inconvenience.    Follow-Up: At [facility], you and your health needs are our priority.  As part of our continuing mission to provide you with exceptional heart care, we have created designated Provider Care Teams.  These Care Teams include your primary Cardiologist (physician) and Advanced Practice Providers (APPs -  Physician Assistants and Nurse Practitioners) who all work together to provide you with the care you need, when you need it.  We recommend signing up for the patient portal called "MyChart".  Sign up information is provided on this After Visit Summary.  MyChart is used to connect with patients for Virtual Visits (Telemedicine).  Patients are able to view lab/test results, encounter notes, upcoming appointments, etc.  Non-urgent messages can be sent to your  provider as well.   To learn more about what you can do with MyChart, go to [email or web address].    Your next  appointment:   4 month(s)  Provider:   Little Ishikawa, MD     Other Instructions Please put on compression stocking in am and take off at bedtime           Signed, Little Ishikawa, MD  09/20/2023 5:43 PM    [facility]

## 2023-09-19 ENCOUNTER — Encounter: Payer: Self-pay | Admitting: Cardiology

## 2023-09-19 ENCOUNTER — Ambulatory Visit: Payer: 59 | Attending: Cardiology | Admitting: Cardiology

## 2023-09-19 VITALS — BP 128/84 | HR 54 | Ht 69.0 in | Wt 109.0 lb

## 2023-09-19 DIAGNOSIS — I4892 Unspecified atrial flutter: Secondary | ICD-10-CM

## 2023-09-19 DIAGNOSIS — Z720 Tobacco use: Secondary | ICD-10-CM

## 2023-09-19 DIAGNOSIS — E785 Hyperlipidemia, unspecified: Secondary | ICD-10-CM | POA: Diagnosis not present

## 2023-09-19 DIAGNOSIS — M7989 Other specified soft tissue disorders: Secondary | ICD-10-CM | POA: Diagnosis not present

## 2023-09-19 DIAGNOSIS — I10 Essential (primary) hypertension: Secondary | ICD-10-CM | POA: Diagnosis not present

## 2023-09-19 DIAGNOSIS — D509 Iron deficiency anemia, unspecified: Secondary | ICD-10-CM

## 2023-09-19 NOTE — Patient Instructions (Addendum)
Medication Instructions:  Continue current medication *If you need a refill on your cardiac medications before your next appointment, please call your pharmacy*   Lab Work:  tsh, lipid, bnp, cmet If you have labs (blood work) drawn today and your tests are completely normal, you will receive your results only by: MyChart Message (if you have MyChart) OR A paper copy in the mail If you have any lab test that is abnormal or we need to change your treatment, we will call you to review the results.   Testing/Procedures: Your physician has requested that you have a lower extremity venous duplex. This test is an ultrasound of the veins in the legs. It looks at venous blood flow that carries blood from the heart to the legs or arms. Allow one hour for a Lower Venous exam. There are no restrictions or special instructions.  Please note: We ask at that you not bring children with you during ultrasound (echo/ vascular) testing. Due to room size and safety concerns, children are not allowed in the ultrasound rooms during exams. Our front office staff cannot provide observation of children in our lobby area while testing is being conducted. An adult accompanying a patient to their appointment will only be allowed in the ultrasound room at the discretion of the ultrasound technician under special circumstances. We apologize for any inconvenience.    Follow-Up: At [facility], you and your health needs are our priority.  As part of our continuing mission to provide you with exceptional heart care, we have created designated Provider Care Teams.  These Care Teams include your primary Cardiologist (physician) and Advanced Practice Providers (APPs -  Physician Assistants and Nurse Practitioners) who all work together to provide you with the care you need, when you need it.  We recommend signing up for the patient portal called "MyChart".  Sign up information is provided on this After Visit Summary.   MyChart is used to connect with patients for Virtual Visits (Telemedicine).  Patients are able to view lab/test results, encounter notes, upcoming appointments, etc.  Non-urgent messages can be sent to your provider as well.   To learn more about what you can do with MyChart, go to [email or web address].    Your next appointment:   4 month(s)  Provider:   Little Ishikawa, MD     Other Instructions Please put on compression stocking in am and take off at bedtime

## 2023-09-20 ENCOUNTER — Inpatient Hospital Stay: Payer: 59

## 2023-09-20 ENCOUNTER — Inpatient Hospital Stay: Payer: 59 | Attending: Internal Medicine | Admitting: Physician Assistant

## 2023-09-20 VITALS — BP 113/75 | HR 57 | Temp 98.1°F | Resp 16 | Ht 69.0 in | Wt 108.5 lb

## 2023-09-20 DIAGNOSIS — D509 Iron deficiency anemia, unspecified: Secondary | ICD-10-CM | POA: Diagnosis not present

## 2023-09-20 DIAGNOSIS — Z79899 Other long term (current) drug therapy: Secondary | ICD-10-CM | POA: Insufficient documentation

## 2023-09-20 LAB — CBC WITH DIFFERENTIAL (CANCER CENTER ONLY)
Abs Immature Granulocytes: 0.02 10*3/uL (ref 0.00–0.07)
Basophils Absolute: 0 10*3/uL (ref 0.0–0.1)
Basophils Relative: 1 %
Eosinophils Absolute: 0.2 10*3/uL (ref 0.0–0.5)
Eosinophils Relative: 4 %
HCT: 40.6 % (ref 36.0–46.0)
Hemoglobin: 12.8 g/dL (ref 12.0–15.0)
Immature Granulocytes: 0 %
Lymphocytes Relative: 16 %
Lymphs Abs: 0.8 10*3/uL (ref 0.7–4.0)
MCH: 27.1 pg (ref 26.0–34.0)
MCHC: 31.5 g/dL (ref 30.0–36.0)
MCV: 86 fL (ref 80.0–100.0)
Monocytes Absolute: 0.4 10*3/uL (ref 0.1–1.0)
Monocytes Relative: 8 %
Neutro Abs: 3.7 10*3/uL (ref 1.7–7.7)
Neutrophils Relative %: 71 %
Platelet Count: 265 10*3/uL (ref 150–400)
RBC: 4.72 MIL/uL (ref 3.87–5.11)
RDW: 20.5 % — ABNORMAL HIGH (ref 11.5–15.5)
WBC Count: 5.1 10*3/uL (ref 4.0–10.5)
nRBC: 0 % (ref 0.0–0.2)

## 2023-09-20 LAB — SAMPLE TO BLOOD BANK

## 2023-09-20 LAB — IRON AND IRON BINDING CAPACITY (CC-WL,HP ONLY)
Iron: 45 ug/dL (ref 28–170)
Saturation Ratios: 16 % (ref 10.4–31.8)
TIBC: 286 ug/dL (ref 250–450)
UIBC: 241 ug/dL (ref 148–442)

## 2023-09-20 LAB — FERRITIN: Ferritin: 40 ng/mL (ref 11–307)

## 2023-09-20 NOTE — Telephone Encounter (Signed)
 Called the patient's listed phone number to arrange a new EGD/colooscopy appointment at [facility]. The patient is out of the home until after 3 pm per female answering the telephone. I will call back after that time. Will offer 10/13/23 at 10:45 am, arriving at 9:15 am.

## 2023-09-21 ENCOUNTER — Ambulatory Visit: Payer: 59 | Admitting: Podiatry

## 2023-09-21 LAB — TSH: TSH: 9.93 u[IU]/mL — ABNORMAL HIGH (ref 0.450–4.500)

## 2023-09-22 ENCOUNTER — Other Ambulatory Visit: Payer: Self-pay | Admitting: *Deleted

## 2023-09-22 ENCOUNTER — Encounter (INDEPENDENT_AMBULATORY_CARE_PROVIDER_SITE_OTHER): Payer: Self-pay | Admitting: *Deleted

## 2023-09-22 LAB — LIPID PANEL
Chol/HDL Ratio: 1.7 {ratio} (ref 0.0–4.4)
Cholesterol, Total: 215 mg/dL — ABNORMAL HIGH (ref 100–199)
HDL: 128 mg/dL (ref >39)
LDL Chol Calc (NIH): 74 mg/dL (ref 0–99)
Triglycerides: 76 mg/dL (ref 0–149)
VLDL Cholesterol Cal: 13 mg/dL (ref 5–40)

## 2023-09-22 LAB — SPECIMEN STATUS REPORT

## 2023-09-23 LAB — BRAIN NATRIURETIC PEPTIDE

## 2023-09-28 ENCOUNTER — Telehealth: Payer: Self-pay | Admitting: *Deleted

## 2023-09-28 ENCOUNTER — Other Ambulatory Visit: Payer: Self-pay | Admitting: *Deleted

## 2023-09-28 DIAGNOSIS — I10 Essential (primary) hypertension: Secondary | ICD-10-CM

## 2023-09-28 LAB — T4, FREE: Free T4: 1.3 ng/dL (ref 0.82–1.77)

## 2023-09-28 LAB — SPECIMEN STATUS REPORT

## 2023-09-28 NOTE — Telephone Encounter (Signed)
Called to make patient aware that [name] would like for her to have lab work for tsh, Free t4, cmet and bnp. Patient verbalized understanding she will get labs done next week. Veronica Stewart

## 2023-09-28 NOTE — Telephone Encounter (Signed)
-----   Message from Little Ishikawa sent at 09/23/2023  6:16 AM EST ----- We had ordered a CMET but looks like was not done for some reason.  Her BNP also did not result  Recommend she return to have labs done, would check TSH/free T4, CMET, and BNP

## 2023-09-28 NOTE — Progress Notes (Signed)
Received paperwork for [facility] requested information with date of service for lab work done and diagnosis. Paperwork signed  by provider and faxed to [phone]  and confirmation received.

## 2023-09-29 ENCOUNTER — Encounter: Payer: Self-pay | Admitting: Podiatry

## 2023-09-29 ENCOUNTER — Ambulatory Visit (INDEPENDENT_AMBULATORY_CARE_PROVIDER_SITE_OTHER): Payer: 59 | Admitting: Podiatry

## 2023-09-29 DIAGNOSIS — M79674 Pain in right toe(s): Secondary | ICD-10-CM

## 2023-09-29 DIAGNOSIS — B351 Tinea unguium: Secondary | ICD-10-CM

## 2023-09-29 DIAGNOSIS — M79675 Pain in left toe(s): Secondary | ICD-10-CM

## 2023-09-29 DIAGNOSIS — Z7901 Long term (current) use of anticoagulants: Secondary | ICD-10-CM

## 2023-09-29 NOTE — Progress Notes (Signed)
This patient returns to my office for at risk foot care.  This patient requires this care by a professional since this patient will be at risk due to having PAD and coagulation defect due to eliquis.  This patient is unable to cut nails herself since the patient cannot reach her nails.These nails are painful walking and wearing shoes.  She has painful callus both feet.. This patient presents for at risk foot care today.  General Appearance  Alert, conversant and in no acute stress.  Vascular  Dorsalis pedis and posterior tibial  pulses are palpable  bilaterally.  Capillary return is within normal limits  bilaterally. Temperature is within normal limits  bilaterally.  Neurologic  Senn-Weinstein monofilament wire test within normal limits  bilaterally. Muscle power within normal limits bilaterally.  Nails Thick disfigured discolored nails with subungual debris  from hallux to fifth toes left and second to fifth toes right foot.. No evidence of bacterial infection or drainage bilaterally.  Orthopedic  No limitations of motion  feet .  No crepitus or effusions noted.  No bony pathology or digital deformities noted. Plantar exostosis fifth  B/L asymptomatic.  Skin  normotropic skin noted bilaterally.  No signs of infections or ulcers noted.    Onychomycosis  Pain in right toes  Pain in left toes     Consent was obtained for treatment procedures.   Mechanical debridement of nails 1-5 left and 2-5 right foot.performed with a nail nipper.  Filed with dremel without incident.     Return office visit   3 months                   Told patient to return for periodic foot care and evaluation due to potential at risk complications.   Helane Gunther DPM

## 2023-09-30 NOTE — Telephone Encounter (Signed)
Called patient. Female answering the phone says patient is not at home. Unable to offer the 10/13/23 date at this time. There is no opening at this time.

## 2023-10-04 ENCOUNTER — Other Ambulatory Visit: Payer: Self-pay

## 2023-10-04 MED ORDER — SUFLAVE 178.7 G PO SOLR: 1.0000 | Freq: Once | ORAL | 0 refills | Status: AC

## 2023-10-04 NOTE — Telephone Encounter (Signed)
 Spoke with the patient. Offered next opening at the hospital for her procedures. She accepts the date 12/08/23 and declines a nurse visit to review her instructions. Instructions mailed. Encouraged to call with any questions or concerns. Prescription for her prep will be sent to [facility].

## 2023-10-06 ENCOUNTER — Telehealth: Payer: Self-pay | Admitting: Cardiology

## 2023-10-06 NOTE — Telephone Encounter (Signed)
 Pt c/o medication issue:  1. Name of Medication:   amLODipine (NORVASC) 10 MG tablet    2. How are you currently taking this medication (dosage and times per day)? See below   3. Are you having a reaction (difficulty breathing--STAT)? No   4. What is your medication issue? Pt called in stating insurance won't fill because it is too early. She states she has been taking twice a day and not once a day. Please advise.

## 2023-10-06 NOTE — Telephone Encounter (Signed)
 Called and spoke to [facility] pharmacy  Pharmacy states:   -patient picked up 90 day on 12/31  -will cost patient $40 for a 30 day supply   Patient identification verified by 2 forms. Marilynn Rail, RN    Called and spoke to patient  Relayed pharmacy message Patient states:    -without Rx for 4-5 days   -is okay with picking up 30 day supply for $40  Advised patient to only take Rx once daily not BID  Reviewed ED warning signs/precautions  Patient verbalized understanding, no questions at this time

## 2023-10-10 NOTE — Telephone Encounter (Signed)
 Pt is requesting a callback from nurse Wardell Heath she spoke with last week regarding this medication. She stated they still won't release it to her even offering to pay the $40.00 and she's unsure why. Please advise

## 2023-10-10 NOTE — Telephone Encounter (Signed)
 Called and spoke to [facility] pharmacy  Pharmacy states:   -patient tried to pick up Rx on Friday but was saying she is supposed to take Amlodipine BID  -they can process a 30 day supply for $10  -will reiterate to patient to take Rx once daily    Patient identification verified by 2 forms. Marilynn Rail, RN    Called and spoke to patient  Informed patient:   -Amlodipine 10mg  to be taken daily   -hydralazine 25mg  BID   -Metoprolol 25mg  daily   -per pharmacy can pick up 30 day supply for $10  Patient verbalized understanding, no questions at this time

## 2023-10-13 ENCOUNTER — Encounter (HOSPITAL_COMMUNITY): Payer: Self-pay | Admitting: Anesthesiology

## 2023-10-13 ENCOUNTER — Encounter (HOSPITAL_COMMUNITY): Admission: RE | Payer: Self-pay | Source: Ambulatory Visit

## 2023-10-13 ENCOUNTER — Ambulatory Visit (HOSPITAL_COMMUNITY): Admission: RE | Admit: 2023-10-13 | Payer: 59 | Source: Ambulatory Visit | Admitting: Internal Medicine

## 2023-10-13 LAB — COMPREHENSIVE METABOLIC PANEL
ALT: 7 IU/L (ref 0–32)
AST: 20 IU/L (ref 0–40)
Albumin: 4.4 g/dL (ref 3.9–4.9)
Alkaline Phosphatase: 65 IU/L (ref 44–121)
BUN/Creatinine Ratio: 9 — ABNORMAL LOW (ref 12–28)
BUN: 15 mg/dL (ref 8–27)
Bilirubin Total: 0.5 mg/dL (ref 0.0–1.2)
CO2: 24 mmol/L (ref 20–29)
Calcium: 9.5 mg/dL (ref 8.7–10.3)
Chloride: 99 mmol/L (ref 96–106)
Creatinine, Ser: 1.6 mg/dL — ABNORMAL HIGH (ref 0.57–1.00)
Globulin, Total: 3.2 g/dL (ref 1.5–4.5)
Glucose: 70 mg/dL (ref 70–99)
Potassium: 5 mmol/L (ref 3.5–5.2)
Sodium: 138 mmol/L (ref 134–144)
Total Protein: 7.6 g/dL (ref 6.0–8.5)
eGFR: 35 mL/min/{1.73_m2} — ABNORMAL LOW (ref >59)

## 2023-10-13 LAB — TSH: TSH: 9.97 u[IU]/mL — ABNORMAL HIGH (ref 0.450–4.500)

## 2023-10-13 LAB — BRAIN NATRIURETIC PEPTIDE: BNP: 269.9 pg/mL — ABNORMAL HIGH (ref 0.0–100.0)

## 2023-10-13 LAB — T4, FREE: Free T4: 1.33 ng/dL (ref 0.82–1.77)

## 2023-10-13 SURGERY — ESOPHAGOGASTRODUODENOSCOPY (EGD) WITH PROPOFOL
Anesthesia: Monitor Anesthesia Care

## 2023-10-14 ENCOUNTER — Ambulatory Visit (HOSPITAL_COMMUNITY)
Admission: RE | Admit: 2023-10-14 | Discharge: 2023-10-14 | Disposition: A | Discharge status: Home / Self Care | Payer: 59 | Source: Ambulatory Visit | Attending: Internal Medicine | Admitting: Internal Medicine

## 2023-10-14 DIAGNOSIS — M7989 Other specified soft tissue disorders: Secondary | ICD-10-CM | POA: Insufficient documentation

## 2023-10-19 ENCOUNTER — Other Ambulatory Visit: Payer: Self-pay

## 2023-10-19 ENCOUNTER — Telehealth: Payer: Self-pay

## 2023-10-19 NOTE — Telephone Encounter (Signed)
 Spoke to patient lab results given.Advised to stop taking Amiodarone.

## 2023-10-19 NOTE — Telephone Encounter (Signed)
 Marland Kitchen

## 2023-10-24 ENCOUNTER — Telehealth (INDEPENDENT_AMBULATORY_CARE_PROVIDER_SITE_OTHER): Payer: Self-pay | Admitting: Primary Care

## 2023-10-24 NOTE — Telephone Encounter (Signed)
 Spoke to pt about appt.. Will be present

## 2023-10-25 ENCOUNTER — Encounter (INDEPENDENT_AMBULATORY_CARE_PROVIDER_SITE_OTHER): Payer: Self-pay | Admitting: Primary Care

## 2023-10-25 ENCOUNTER — Ambulatory Visit (INDEPENDENT_AMBULATORY_CARE_PROVIDER_SITE_OTHER): Payer: 59 | Admitting: Primary Care

## 2023-10-25 VITALS — BP 146/90 | HR 67 | Wt 100.0 lb

## 2023-10-25 DIAGNOSIS — Z1231 Encounter for screening mammogram for malignant neoplasm of breast: Secondary | ICD-10-CM | POA: Diagnosis not present

## 2023-10-25 DIAGNOSIS — J4520 Mild intermittent asthma, uncomplicated: Secondary | ICD-10-CM

## 2023-10-25 DIAGNOSIS — J302 Other seasonal allergic rhinitis: Secondary | ICD-10-CM | POA: Diagnosis not present

## 2023-10-25 MED ORDER — LORATADINE 10 MG PO TABS: 10.0000 mg | ORAL_TABLET | Freq: Every day | ORAL | 11 refills | Status: DC

## 2023-10-25 MED ORDER — ALBUTEROL SULFATE HFA 108 (90 BASE) MCG/ACT IN AERS: INHALATION_SPRAY | RESPIRATORY_TRACT | 2 refills | Status: AC

## 2023-10-25 NOTE — Progress Notes (Signed)
 [facility]  Veronica Stewart, is a 68 y.o. female  ZOX:[id number]  WJX:[id number]  DOB - 1955/11/13  Chief Complaint  Patient presents with   Medical Management of Chronic Issues   Medication Refill       Subjective:   Veronica Stewart is a 68 y.o. female here today for refill medication. Bp slightly elevated managed by cardiology. 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     acetaminophen (TYLENOL) 650 MG CR tablet Take 650 mg by mouth every 8 (eight) hours as needed for pain.     amLODipine (NORVASC) 10 MG tablet Take 1 tablet (10 mg total) by mouth daily. 90 tablet 3   aspirin EC 81 MG tablet Take 81 mg by mouth daily. Swallow whole.     DEEP SEA NASAL SPRAY 0.65 % nasal spray SMARTSIG:Both Nares     FeFum-FePoly-FA-B Cmp-C-Biot (INTEGRA PLUS) CAPS Take 1 capsule by mouth every morning. 30 capsule 2   fluticasone (FLONASE) 50 MCG/ACT nasal spray SHAKE LIQUID AND USE 2 SPRAYS IN EACH NOSTRIL DAILY 48 g 0   Fluticasone-Salmeterol (ADVAIR) 100-50 MCG/DOSE AEPB Inhale 1 puff into the lungs 2 (two) times daily. 1 each 3   hydrALAZINE (APRESOLINE) 25 MG tablet TAKE 1 TABLET BY MOUTH TWICE  DAILY 200 tablet 2   metoprolol succinate (TOPROL-XL) 25 MG 24 hr tablet TAKE 1 TABLET BY MOUTH DAILY  WITH A MEAL OR IMMEDIATELY AFTER THE SAME MEAL 100 tablet 2   ondansetron (ZOFRAN) 8 MG tablet Take 8 mg  by mouth 3 (three) times daily.     pantoprazole (PROTONIX) 40 MG tablet TAKE 1 TABLET(40 MG) BY MOUTH TWICE DAILY 180 tablet 3   No current facility-administered medications on file prior to visit.   Health Maintenance  Topic Date Due   Medicare Annual Wellness Visit  Never done   COVID-19 Vaccine (1) Never done   Pneumonia Vaccine (1 of 2 - PCV) Never done   Hepatitis C Screening  Never done   Zoster (Shingles) Vaccine (1 of 2) Never done   Mammogram  Never done   Flu Shot  03/17/2023   Screening for Lung Cancer  12/13/2023   DTaP/Tdap/Td vaccine (2 - Td or Tdap) 03/25/2029   Colon Cancer Screening  09/14/2033   DEXA scan (bone density measurement)  Completed   HPV Vaccine  Aged Out    Objective:   Vitals:   10/25/23 1045  BP: (!) 146/90  Pulse: 67  SpO2: 94%  Weight: 100 lb (45.4 kg)   BP Readings from Last 3 Encounters:  10/25/23 (!) 146/90  09/20/23 113/75  09/19/23 128/84      Physical Exam Vitals reviewed.  HENT:     Right Ear: Tympanic membrane and external ear normal.     Left Ear: Tympanic membrane and external ear normal.  Nose: Nose normal.  Eyes:     Extraocular Movements: Extraocular movements intact.  Cardiovascular:     Rate and Rhythm: Normal rate and regular rhythm.  Pulmonary:     Effort: Pulmonary effort is normal.     Breath sounds: Normal breath sounds.  Abdominal:     General: Bowel sounds are normal.     Palpations: Abdomen is soft.  Musculoskeletal:        General: Normal range of motion.     Cervical back: Normal range of motion.  Skin:    General: Skin is warm and dry.  Neurological:     Mental Status: She is alert and oriented to person, place, and time.  Psychiatric:        Mood and Affect: Mood normal.        Behavior: Behavior normal.     Assessment & Plan  [name] was seen today for medical management of chronic issues and medication refill.  Diagnoses and all orders for this visit:  Encounter for screening  mammogram for malignant neoplasm of breast -     MM 3D SCREENING MAMMOGRAM BILATERAL BREAST; Future  Seasonal allergies -     loratadine (CLARITIN) 10 MG tablet; Take 1 tablet (10 mg total) by mouth daily.  Mild intermittent asthma without complication -     albuterol (VENTOLIN HFA) 108 (90 Base) MCG/ACT inhaler; INHALE 2 PUFFS BY MOUTH EVERY 6 HOURS AS NEEDED FOR WHEEZING AND SHORTNESS OF BREATH    Patient have been counseled extensively about nutrition and exercise. Other issues discussed during this visit include: low cholesterol diet, weight control and daily exercise, foot care, annual eye examinations at Ophthalmology, importance of adherence with medications and regular follow-up. We also discussed long term complications of uncontrolled diabetes and hypertension.   Return if symptoms worsen or fail to improve.  The patient was given clear instructions to go to ER or return to medical center if symptoms don't improve, worsen or new problems develop. The patient verbalized understanding. The patient was told to call to get lab results if they haven't heard anything in the next week.   This note has been created with Education officer, environmental. Any transcriptional errors are unintentional.   [name], NP 10/25/2023, 2:26 PM

## 2023-11-11 ENCOUNTER — Telehealth: Payer: Self-pay | Admitting: Cardiology

## 2023-11-11 NOTE — Telephone Encounter (Signed)
I did not need this encounter. °

## 2023-11-26 ENCOUNTER — Other Ambulatory Visit: Payer: Self-pay | Admitting: Cardiology

## 2023-11-26 DIAGNOSIS — I10 Essential (primary) hypertension: Secondary | ICD-10-CM

## 2023-11-30 ENCOUNTER — Telehealth: Payer: Self-pay | Admitting: Gastroenterology

## 2023-11-30 NOTE — Telephone Encounter (Signed)
 Procedure:Endoscopy/Colonoscopy Procedure date: 12/08/23 Procedure location: [facility] Arrival Time: 7:45 am Spoke with the patient Y/N:   No, called main  # unable to leave a message memory/mailbox full, called mobile # unable to leave a message, phone rang until it started beeping 11/30/23 @ 9:56 am  No, called main  # unable to leave a message memory/mailbox full, called mobile # unable to leave a message, phone rang until it started beeping 12/01/23 @ 10:12 am   Any prep concerns? ___  Has the patient obtained the prep from the pharmacy ? ___ Do you have a care partner and transportation: ___ Any additional concerns? ___

## 2023-12-02 ENCOUNTER — Encounter (HOSPITAL_COMMUNITY): Payer: Self-pay | Admitting: Internal Medicine

## 2023-12-02 NOTE — Progress Notes (Addendum)
Attempted to obtain medical history via telephone, unable to reach at this time. Unable to leave voicemail to return pre surgical testing department's phone call,due to mailbox full.  

## 2023-12-06 NOTE — Telephone Encounter (Signed)
 Per DPR - OK to speak with patient's aint Veronica Stewart. I attempted to reach [facility] at [phone]. VM is full, not able to leave a message. Called mobile number on file [phone]). Line rings then goes to fast busy signal. If no return call today, hospital EGD and colonoscopy will be cancelled.

## 2023-12-06 NOTE — Telephone Encounter (Signed)
 Spoke with pt in regard to previous messages.  Pt is aware of date and time and location of procedures. Prep reviewed with pt. Pt verbalized understanding with all questions answered.

## 2023-12-08 ENCOUNTER — Other Ambulatory Visit: Payer: Self-pay

## 2023-12-08 ENCOUNTER — Ambulatory Visit (HOSPITAL_BASED_OUTPATIENT_CLINIC_OR_DEPARTMENT_OTHER)

## 2023-12-08 ENCOUNTER — Ambulatory Visit (HOSPITAL_COMMUNITY)
Admission: RE | Admit: 2023-12-08 | Discharge: 2023-12-08 | Disposition: A | Discharge status: Home / Self Care | Payer: 59 | Source: Ambulatory Visit | Attending: Internal Medicine | Admitting: Internal Medicine

## 2023-12-08 ENCOUNTER — Ambulatory Visit (HOSPITAL_COMMUNITY)

## 2023-12-08 ENCOUNTER — Encounter (HOSPITAL_COMMUNITY): Payer: Self-pay | Admitting: Internal Medicine

## 2023-12-08 ENCOUNTER — Encounter (HOSPITAL_COMMUNITY)
Admission: RE | Disposition: A | Discharge status: Home / Self Care | Payer: Self-pay | Source: Ambulatory Visit | Attending: Internal Medicine

## 2023-12-08 DIAGNOSIS — D123 Benign neoplasm of transverse colon: Secondary | ICD-10-CM

## 2023-12-08 DIAGNOSIS — K648 Other hemorrhoids: Secondary | ICD-10-CM | POA: Diagnosis not present

## 2023-12-08 DIAGNOSIS — C159 Malignant neoplasm of esophagus, unspecified: Secondary | ICD-10-CM | POA: Diagnosis not present

## 2023-12-08 DIAGNOSIS — D125 Benign neoplasm of sigmoid colon: Secondary | ICD-10-CM | POA: Diagnosis not present

## 2023-12-08 DIAGNOSIS — I272 Pulmonary hypertension, unspecified: Secondary | ICD-10-CM | POA: Diagnosis not present

## 2023-12-08 DIAGNOSIS — F17210 Nicotine dependence, cigarettes, uncomplicated: Secondary | ICD-10-CM | POA: Insufficient documentation

## 2023-12-08 DIAGNOSIS — K31A11 Gastric intestinal metaplasia without dysplasia, involving the antrum: Secondary | ICD-10-CM | POA: Diagnosis not present

## 2023-12-08 DIAGNOSIS — K56600 Partial intestinal obstruction, unspecified as to cause: Secondary | ICD-10-CM

## 2023-12-08 DIAGNOSIS — D509 Iron deficiency anemia, unspecified: Secondary | ICD-10-CM | POA: Diagnosis present

## 2023-12-08 DIAGNOSIS — J45909 Unspecified asthma, uncomplicated: Secondary | ICD-10-CM | POA: Insufficient documentation

## 2023-12-08 DIAGNOSIS — R634 Abnormal weight loss: Secondary | ICD-10-CM | POA: Diagnosis not present

## 2023-12-08 DIAGNOSIS — I081 Rheumatic disorders of both mitral and tricuspid valves: Secondary | ICD-10-CM | POA: Insufficient documentation

## 2023-12-08 DIAGNOSIS — K2950 Unspecified chronic gastritis without bleeding: Secondary | ICD-10-CM | POA: Diagnosis not present

## 2023-12-08 DIAGNOSIS — C154 Malignant neoplasm of middle third of esophagus: Secondary | ICD-10-CM | POA: Diagnosis not present

## 2023-12-08 DIAGNOSIS — K5730 Diverticulosis of large intestine without perforation or abscess without bleeding: Secondary | ICD-10-CM | POA: Diagnosis not present

## 2023-12-08 DIAGNOSIS — Z681 Body mass index (BMI) 19 or less, adult: Secondary | ICD-10-CM | POA: Diagnosis not present

## 2023-12-08 DIAGNOSIS — K2960 Other gastritis without bleeding: Secondary | ICD-10-CM | POA: Diagnosis not present

## 2023-12-08 DIAGNOSIS — I739 Peripheral vascular disease, unspecified: Secondary | ICD-10-CM | POA: Insufficient documentation

## 2023-12-08 DIAGNOSIS — D649 Anemia, unspecified: Secondary | ICD-10-CM | POA: Diagnosis not present

## 2023-12-08 DIAGNOSIS — K2970 Gastritis, unspecified, without bleeding: Secondary | ICD-10-CM

## 2023-12-08 DIAGNOSIS — I10 Essential (primary) hypertension: Secondary | ICD-10-CM | POA: Diagnosis not present

## 2023-12-08 HISTORY — PX: ESOPHAGOGASTRODUODENOSCOPY (EGD) WITH PROPOFOL: SHX5813

## 2023-12-08 HISTORY — PX: COLONOSCOPY WITH PROPOFOL: SHX5780

## 2023-12-08 SURGERY — ESOPHAGOGASTRODUODENOSCOPY (EGD) WITH PROPOFOL
Anesthesia: General

## 2023-12-08 MED ORDER — PROPOFOL 1000 MG/100ML IV EMUL
INTRAVENOUS | Status: AC
  Filled 2023-12-08: qty 100

## 2023-12-08 MED ORDER — PHENYLEPHRINE HCL-NACL 20-0.9 MG/250ML-% IV SOLN
INTRAVENOUS | Status: DC | PRN
Start: 2023-12-08 — End: 2023-12-08
  Administered 2023-12-08: 30 ug/min via INTRAVENOUS

## 2023-12-08 MED ORDER — LACTATED RINGERS IV SOLN: INTRAVENOUS | Status: DC | PRN

## 2023-12-08 MED ORDER — PROPOFOL 10 MG/ML IV BOLUS
INTRAVENOUS | Status: DC | PRN
  Administered 2023-12-08: 60 mg via INTRAVENOUS

## 2023-12-08 MED ORDER — SODIUM CHLORIDE 0.9 % IV SOLN: INTRAVENOUS | Status: DC

## 2023-12-08 MED ORDER — SPOT INK MARKER SYRINGE KIT
PACK | SUBMUCOSAL | Status: DC | PRN
  Administered 2023-12-08: 1 mL via SUBMUCOSAL

## 2023-12-08 MED ORDER — ONDANSETRON HCL 4 MG/2ML IJ SOLN
INTRAMUSCULAR | Status: DC | PRN
  Administered 2023-12-08: 4 mg via INTRAVENOUS

## 2023-12-08 MED ORDER — LIDOCAINE 2% (20 MG/ML) 5 ML SYRINGE
INTRAMUSCULAR | Status: DC | PRN
  Administered 2023-12-08: 60 mg via INTRAVENOUS

## 2023-12-08 MED ORDER — SUCCINYLCHOLINE CHLORIDE 200 MG/10ML IV SOSY
PREFILLED_SYRINGE | INTRAVENOUS | Status: DC | PRN
  Administered 2023-12-08: 60 mg via INTRAVENOUS

## 2023-12-08 SURGICAL SUPPLY — 20 items
BLOCK BITE 60FR ADLT L/F BLUE (MISCELLANEOUS) ×1 IMPLANT
ELECTRODE REM PT RTRN 9FT ADLT (ELECTROSURGICAL) IMPLANT
FORCEP RJ3 GP 1.8X160 W-NEEDLE (CUTTING FORCEPS) IMPLANT
FORCEPS BIOP RAD 4 LRG CAP 4 (CUTTING FORCEPS) IMPLANT
FORCEPS BXJMBJMB 240X2.8X (CUTTING FORCEPS) IMPLANT
INJECTOR/SNARE I SNARE (MISCELLANEOUS) IMPLANT
LUBRICANT JELLY 4.5OZ STERILE (MISCELLANEOUS) IMPLANT
MANIFOLD NEPTUNE II (INSTRUMENTS) IMPLANT
NDL SCLEROTHERAPY 25GX240 (NEEDLE) IMPLANT
NEEDLE SCLEROTHERAPY 25GX240 (NEEDLE) IMPLANT
PAD FLOOR 36X40 (MISCELLANEOUS) ×1 IMPLANT
PROBE APC STR FIRE (PROBE) IMPLANT
PROBE INJECTION GOLD 7FR (MISCELLANEOUS) IMPLANT
SNARE ROTATE MED OVAL 20MM (MISCELLANEOUS) IMPLANT
SNARE SHORT THROW 13M SML OVAL (MISCELLANEOUS) IMPLANT
SYR 50ML LL SCALE MARK (SYRINGE) IMPLANT
TRAP SPECIMEN MUCOUS 40CC (MISCELLANEOUS) IMPLANT
TUBING ENDO SMARTCAP PENTAX (MISCELLANEOUS) ×2 IMPLANT
TUBING IRRIGATION ENDOGATOR (MISCELLANEOUS) ×1 IMPLANT
WATER STERILE IRR 1000ML POUR (IV SOLUTION) IMPLANT

## 2023-12-08 NOTE — Anesthesia Procedure Notes (Signed)
 Procedure Name: Intubation Date/Time: 12/08/2023 9:15 AM  Performed by: [place], Avi Lemma, CRNAPre-anesthesia Checklist: Patient identified, Emergency Drugs available, Suction available and Patient being monitored Patient Re-evaluated:Patient Re-evaluated prior to induction Oxygen Delivery Method: Circle system utilized Preoxygenation: Pre-oxygenation with 100% oxygen Induction Type: IV induction Laryngoscope Size: Mac, Glidescope and 3 Grade View: Grade I Tube type: Oral Tube size: 7.0 mm Number of attempts: 1 Airway Equipment and Method: Oral airway and Rigid stylet Placement Confirmation: ETT inserted through vocal cords under direct vision, positive ETCO2 and breath sounds checked- equal and bilateral Secured at: 20 cm Tube secured with: Tape Dental Injury: Teeth and Oropharynx as per pre-operative assessment  Difficulty Due To: Difficulty was anticipated Future Recommendations: Recommend- induction with short-acting agent, and alternative techniques readily available

## 2023-12-08 NOTE — Op Note (Addendum)
 [facility] Patient Name: Veronica Stewart Procedure Date: 12/08/2023 MRN: [id number] Attending MD: Veronica Stewart , , [id number] Date of Birth: Jan 08, 1956 CSN: [id number] Age: 68 Admit Type: Outpatient Procedure:                Upper GI endoscopy Indications:              Iron  deficiency anemia, Weight loss Providers:                Veronica Stewart" Veronica Maxcy, RN, Veronica Stewart, Technician Referring MD:             Veronica Stewart, Veronica Stewart Medicines:                Monitored Anesthesia Care Complications:            No immediate complications. Estimated Blood Loss:     Estimated blood loss was minimal. Procedure:                Pre-Anesthesia Assessment:                           - Prior to the procedure, a History and Physical                            was performed, and patient medications and                            allergies were reviewed. The patient's tolerance of                            previous anesthesia was also reviewed. The risks                            and benefits of the procedure and the sedation                            options and risks were discussed with the patient.                            All questions were answered, and informed consent                            was obtained. Prior Anticoagulants: The patient has                            taken no anticoagulant or antiplatelet agents. ASA                            Grade Assessment: III - A patient with severe                            systemic disease. After reviewing the risks and  benefits, the patient was deemed in satisfactory                            condition to undergo the procedure.                           After obtaining informed consent, the endoscope was                            passed under direct vision. Throughout the                            procedure, the patient's blood  pressure, pulse, and                            oxygen saturations were monitored continuously. The                            [id number]-[id number] ([id number]) Olympus endoscope was introduced                            through the mouth, and advanced to the second part                            of duodenum. The upper GI endoscopy was                            accomplished without difficulty. The patient                            tolerated the procedure well. Scope In: Scope Out: Findings:      A large, fungating mass with no bleeding and stigmata of recent bleeding       was found in the mid esophagus, 28 to 31 cm from the incisors. The mass       was partially obstructing and partially circumferential (involving two       thirds of the lumen circumference). Biopsies were taken with a cold       forceps for histology.      Localized mild inflammation characterized by congestion (edema),       erosions and erythema was found in the gastric antrum. Biopsies were       taken with a cold forceps for histology.      The examined duodenum was normal. 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                            Biopsied.                           - Gastritis. Biopsied.                           -  Normal examined duodenum. Moderate Sedation:      Not Applicable - Patient had care per Anesthesia. Recommendation:           - Await pathology results. Asked for this to be                            rushed.                           - Perform a colonoscopy today. Procedure Code(s):        --- Professional ---                           [phone], Esophagogastroduodenoscopy, flexible,                            transoral; with biopsy, single or multiple Diagnosis Code(s):        --- Professional ---                           D49.0, Neoplasm of unspecified behavior of                            digestive  system                           K29.70, Gastritis, unspecified, without bleeding                           D50.9, Iron  deficiency anemia, unspecified                           R63.4, Abnormal weight loss CPT copyright 2022 American Medical Association. All rights reserved. The codes documented in this report are preliminary and upon coder review may  be revised to meet current compliance requirements. Dr Veronica Stewart "Veronica Stewart" Veronica Stewart,  12/08/2023 10:40:05 AM Number of Addenda: 0

## 2023-12-08 NOTE — Anesthesia Postprocedure Evaluation (Signed)
 Anesthesia Post Note  Patient: JAIDIN UGARTE  Procedure(s) Performed: ESOPHAGOGASTRODUODENOSCOPY (EGD) WITH PROPOFOL  COLONOSCOPY WITH PROPOFOL      Patient location during evaluation: PACU Anesthesia Type: General Level of consciousness: awake and alert Pain management: pain level controlled Vital Signs Assessment: post-procedure vital signs reviewed and stable Respiratory status: spontaneous breathing, nonlabored ventilation, respiratory function stable and patient connected to nasal cannula oxygen Cardiovascular status: blood pressure returned to baseline and stable Postop Assessment: no apparent nausea or vomiting Anesthetic complications: yes   Encounter Notable Events  Notable Event Outcome Phase Comment  Difficult to intubate - expected  Intraprocedure Filed from anesthesia note documentation.    Last Vitals:  Vitals:   12/08/23 1050 12/08/23 1100  BP: (!) 160/91 (!) 157/86  Pulse: 62 63  Resp: 11 13  Temp:    SpO2: 100% 98%    Last Pain:  Vitals:   12/08/23 1100  TempSrc:   PainSc: 0-No pain                 Lethaniel Rave

## 2023-12-08 NOTE — Anesthesia Preprocedure Evaluation (Addendum)
 Anesthesia Evaluation    Reviewed: Allergy & Precautions, Patient's Chart, lab work & pertinent test results, reviewed documented beta blocker date and time   Airway Mallampati: II  TM Distance: >3 FB Neck ROM: Full    Dental no notable dental hx.    Pulmonary asthma , Current Smoker   Pulmonary exam normal breath sounds clear to auscultation       Cardiovascular hypertension, Pt. on medications and Pt. on home beta blockers pulmonary hypertension (mod pHTN on echo 2024)+ Peripheral Vascular Disease  Normal cardiovascular exam+ dysrhythmias Atrial Fibrillation + Valvular Problems/Murmurs (mod-severe TR, mild MR) MR  Rhythm:Regular Rate:Normal  Echo 2024  1. Left ventricular ejection fraction, by estimation, is 60 to 65%. The  left ventricle has normal function. The left ventricle has no regional  wall motion abnormalities. Left ventricular diastolic parameters were  normal. The average left ventricular  global longitudinal strain is 20.9 %. The global longitudinal strain is  normal.   2. Right ventricular systolic function is normal. The right ventricular  size is normal. There is moderately elevated pulmonary artery systolic  pressure. The estimated right ventricular systolic pressure is 58.1 mmHg.   3. Left atrial size was moderately dilated.   4. Right atrial size was moderately dilated.   5. The mitral valve is normal in structure. Mild mitral valve  regurgitation. No evidence of mitral stenosis.   6. Tricuspid valve regurgitation is moderate to severe.   7. The aortic valve is tricuspid. Aortic valve regurgitation is not  visualized. No aortic stenosis is present.   8. The inferior vena cava is normal in size with greater than 50%  respiratory variability, suggesting right atrial pressure of 3 mmHg.     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  Reproductive/Obstetrics negative OB ROS                             Anesthesia Physical Anesthesia Plan  ASA: 4  Anesthesia Plan: General   Post-op Pain Management:    Induction: Intravenous  PONV Risk Score and Plan: 2 and Propofol  infusion and TIVA  Airway Management Planned: Natural Airway and Simple Face Mask  Additional Equipment: None  Intra-op Plan:   Post-operative Plan: Extubation in OR  Informed Consent: I have reviewed the patients History and Physical, chart, labs and discussed the procedure including the risks, benefits and alternatives for the proposed anesthesia with the patient or authorized representative who has indicated his/her understanding and acceptance.     Dental advisory given  Plan Discussed with: CRNA  Anesthesia Plan Comments:         Anesthesia Quick Evaluation

## 2023-12-08 NOTE — Discharge Instructions (Signed)
YOU HAD AN ENDOSCOPIC PROCEDURE TODAY: Refer to the procedure report and other information in the discharge instructions given to you for any specific questions about what was found during the examination. If this information does not answer your questions, please call  office at [phone] to clarify.  ° °YOU SHOULD EXPECT: Some feelings of bloating in the abdomen. Passage of more gas than usual. Walking can help get rid of the air that was put into your GI tract during the procedure and reduce the bloating. If you had a lower endoscopy (such as a colonoscopy or flexible sigmoidoscopy) you may notice spotting of blood in your stool or on the toilet paper. Some abdominal soreness may be present for a day or two, also. ° °DIET: Your first meal following the procedure should be a light meal and then it is ok to progress to your normal diet. A half-sandwich or bowl of soup is an example of a good first meal. Heavy or fried foods are harder to digest and may make you feel nauseous or bloated. Drink plenty of fluids but you should avoid alcoholic beverages for 24 hours. If you had a esophageal dilation, please see attached instructions for diet.   ° °ACTIVITY: Your care partner should take you home directly after the procedure. You should plan to take it easy, moving slowly for the rest of the day. You can resume normal activity the day after the procedure however YOU SHOULD NOT DRIVE, use power tools, machinery or perform tasks that involve climbing or major physical exertion for 24 hours (because of the sedation medicines used during the test).  ° °SYMPTOMS TO REPORT IMMEDIATELY: °A gastroenterologist can be reached at any hour. Please call [phone]  for any of the following symptoms:  °Following lower endoscopy (colonoscopy, flexible sigmoidoscopy) °Excessive amounts of blood in the stool  °Significant tenderness, worsening of abdominal pains  °Swelling of the abdomen that is new, acute  °Fever of 100° or  higher  °Following upper endoscopy (EGD, EUS, ERCP, esophageal dilation) °Vomiting of blood or coffee ground material  °New, significant abdominal pain  °New, significant chest pain or pain under the shoulder blades  °Painful or persistently difficult swallowing  °New shortness of breath  °Black, tarry-looking or red, bloody stools ° °FOLLOW UP:  °If any biopsies were taken you will be contacted by phone or by letter within the next 1-3 weeks. Call [phone]  if you have not heard about the biopsies in 3 weeks.  °Please also call with any specific questions about appointments or follow up tests. ° °

## 2023-12-08 NOTE — Transfer of Care (Signed)
 Immediate Anesthesia Transfer of Care Note  Patient: Veronica Stewart  Procedure(s) Performed: ESOPHAGOGASTRODUODENOSCOPY (EGD) WITH PROPOFOL  COLONOSCOPY WITH PROPOFOL   Patient Location: PACU and Endoscopy Unit  Anesthesia Type:General  Level of Consciousness: awake, alert , and oriented  Airway & Oxygen Therapy: Patient Spontanous Breathing and Patient connected to face mask oxygen  Post-op Assessment: Report given to RN and Post -op Vital signs reviewed and stable  Post vital signs: Reviewed and stable  Last Vitals:  Vitals Value Taken Time  BP 179/94 12/08/23 1038  Temp    Pulse 62 12/08/23 1040  Resp 12 12/08/23 1039  SpO2 100 % 12/08/23 1040  Vitals shown include unfiled device data.  Last Pain:  Vitals:   12/08/23 0814  TempSrc: Temporal  PainSc: 0-No pain         Complications:  Encounter Notable Events  Notable Event Outcome Phase Comment  Difficult to intubate - expected  Intraprocedure Filed from anesthesia note documentation.

## 2023-12-08 NOTE — Op Note (Signed)
 [facility] Patient Name: Veronica Stewart Procedure Date: 12/08/2023 MRN: [id number] Attending MD: Pedro Bourgeois , , [id number] Date of Birth: [birth year] CSN: [id number] Age: 68 Admit Type: Outpatient Procedure:                Colonoscopy Indications:              Iron  deficiency anemia, Weight loss Providers:                Freada Jacobs" Jacqulyn Maxcy, RN, Judith Novak, Technician Referring MD:             Marius Siemens Medicines:                Monitored Anesthesia Care Complications:            No immediate complications. Estimated Blood Loss:     Estimated blood loss was minimal. Procedure:                Pre-Anesthesia Assessment:                           - Prior to the procedure, a History and Physical                            was performed, and patient medications and                            allergies were reviewed. The patient's tolerance of                            previous anesthesia was also reviewed. The risks                            and benefits of the procedure and the sedation                            options and risks were discussed with the patient.                            All questions were answered, and informed consent                            was obtained. Prior Anticoagulants: The patient has                            taken no anticoagulant or antiplatelet agents. ASA                            Grade Assessment: III - A patient with severe                            systemic disease. After reviewing the risks and  benefits, the patient was deemed in satisfactory                            condition to undergo the procedure.                           After obtaining informed consent, the colonoscope                            was passed under direct vision. Throughout the                            procedure, the patient's blood pressure, pulse, and                             oxygen saturations were monitored continuously. The                            PCF-HQ190L ([id number]) Olympus colonoscope was                            introduced through the anus and advanced to the the                            cecum, identified by appendiceal orifice and                            ileocecal valve. The colonoscopy was performed                            without difficulty. The patient tolerated the                            procedure well. The quality of the bowel                            preparation was adequate. The ileocecal valve,                            appendiceal orifice, and rectum were photographed. Scope In: 9:39:28 AM Scope Out: 10:24:57 AM Scope Withdrawal Time: 0 hours 18 minutes 46 seconds  Total Procedure Duration: 0 hours 45 minutes 29 seconds  Findings:      Two sessile polyps were found in the sigmoid colon and transverse colon.       The polyps were 3 to 6 mm in size. These polyps were removed with a cold       snare. Resection and retrieval were complete.      Multiple diverticula were found in the sigmoid colon and descending       colon.      A 20 mm polyp was found in the sigmoid colon. The polyp was       pedunculated. The polyp was removed with a hot snare. Resection and       retrieval were complete. Area was tattooed with an injection of Spot       (  carbon black).      Non-bleeding internal hemorrhoids were found during retroflexion. Impression:               - Two 3 to 6 mm polyps in the sigmoid colon and in                            the transverse colon, removed with a cold snare.                            Resected and retrieved.                           - Diverticulosis in the sigmoid colon and in the                            descending colon.                           - One 20 mm polyp in the sigmoid colon, removed                            with a hot snare. Resected and retrieved. Tattooed.                            - Non-bleeding internal hemorrhoids. Moderate Sedation:      Not Applicable - Patient had care per Anesthesia. Recommendation:           - Discharge patient to home (with escort).                           - Await pathology results.                           - The findings and recommendations were discussed                            with the patient. Procedure Code(s):        --- Professional ---                           [phone], Colonoscopy, flexible; with removal of                            tumor(s), polyp(s), or other lesion(s) by snare                            technique                           45381, Colonoscopy, flexible; with directed                            submucosal injection(s), any substance Diagnosis Code(s):        --- Professional ---  D12.5, Benign neoplasm of sigmoid colon                           D12.3, Benign neoplasm of transverse colon (hepatic                            flexure or splenic flexure)                           K64.8, Other hemorrhoids                           D50.9, Iron  deficiency anemia, unspecified                           R63.4, Abnormal weight loss                           K57.30, Diverticulosis of large intestine without                            perforation or abscess without bleeding CPT copyright 2022 American [employer] Association. All rights reserved. The codes documented in this report are preliminary and upon coder review may  be revised to meet current compliance requirements. Dr Pedro Bourgeois "[name]" [name],  12/08/2023 10:44:11 AM Number of Addenda: 0

## 2023-12-08 NOTE — H&P (Signed)
 GASTROENTEROLOGY PROCEDURE H&P NOTE   Primary Care Physician: Marius Siemens, NP    Reason for Procedure:   Iron  deficiency anemia, weight loss  Plan:    EGD/colonoscopy  Patient is appropriate for endoscopic procedure(s) in the ambulatory (hospital) setting.  The nature of the procedure, as well as the risks, benefits, and alternatives were carefully and thoroughly reviewed with the patient. Ample time for discussion and questions allowed. The patient understood, was satisfied, and agreed to proceed.     HPI: Veronica Stewart is a 68 y.o. female who presents for EGD/colonoscopy for evaluation of iron  deficiency anemia and weight loss.  Patient was most recently seen in the Gastroenterology Clinic on 06/27/23.  No interval change in medical history since that appointment. Please refer to that note for full details regarding GI history and clinical presentation.   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[facility])     Past Surgical History:  Procedure Laterality Date   ABDOMINAL AORTOGRAM W/LOWER EXTREMITY Bilateral 03/05/2020   Procedure: ABDOMINAL AORTOGRAM W/LOWER EXTREMITY;  Surgeon: Young Hensen, MD;  Location: [facility] INVASIVE [facility];  Service: Cardiovascular;  Laterality: Bilateral;   ENDARTERECTOMY FEMORAL Left 03/24/2020   Procedure: LEFT COMMON FEMORAL ENDARTERECTOMY;  Surgeon: Young Hensen, MD;  Location: [facility] OR;  Service: Vascular;  Laterality: Left;   ENDOVEIN HARVEST OF GREATER SAPHENOUS VEIN Left 03/24/2020   Procedure: HARVEST OF GREATER SAPHENOUS VEIN;  Surgeon: Young Hensen, MD;  Location: [facility] OR;  Service: Vascular;  Laterality: Left;   FEMORAL-POPLITEAL BYPASS GRAFT Left 03/24/2020   Procedure: BYPASS GRAFT FEMORAL-POPLITEAL ARTERY;  Surgeon: Young Hensen, MD;  Location: [facility] OR;  Service:  Vascular;  Laterality: Left;   INSERTION OF ILIAC STENT Left 03/24/2020   Procedure: INSERTION OF RETROGRADE ILIAC STENT;  Surgeon: Young Hensen, MD;  Location: [facility] OR;  Service: Vascular;  Laterality: Left;   INTRAMEDULLARY (IM) NAIL INTERTROCHANTERIC Left 03/18/2018   Procedure: INTRAMEDULLARY (IM) NAIL INTERTROCHANTRIC;  Surgeon: Laneta Pintos, MD;  Location: [facility] OR;  Service: Orthopedics;  Laterality: Left;   INTRAOPERATIVE ARTERIOGRAM Left 03/24/2020   Procedure: INTRA OPERATIVE ARTERIOGRAM;  Surgeon: Young Hensen, MD;  Location: [facility] OR;  Service: Vascular;  Laterality: Left;    Prior to Admission medications   Medication Sig Start Date End Date Taking? Authorizing Provider  acetaminophen  (TYLENOL ) 500 MG tablet Take 500 mg by mouth every 6 (six) hours as needed for mild pain.   Yes [provider]  acetaminophen  (TYLENOL ) 650 MG CR tablet Take 650 mg by mouth every 8 (eight) hours as needed for pain.   Yes [provider]  albuterol  (VENTOLIN  HFA) 108 (90 Base) MCG/ACT inhaler INHALE 2 PUFFS BY MOUTH EVERY 6 HOURS AS NEEDED FOR WHEEZING AND SHORTNESS OF BREATH 10/25/23  Yes Marius Siemens, NP  amLODipine  (NORVASC ) 10 MG tablet Take 1 tablet (10 mg total) by mouth daily. 07/26/23  Yes Wendie Hamburg, MD  aspirin  EC 81 MG tablet Take 81 mg by mouth daily. Swallow whole.   Yes [provider]  DEEP SEA NASAL SPRAY 0.65 % nasal spray SMARTSIG:Both Nares 09/21/21  Yes [provider]  FeFum-FePoly-FA-B Cmp-C-Biot (INTEGRA PLUS ) CAPS Take 1 capsule by mouth every morning. 07/12/23  Yes Heilingoetter, Cassandra L, PA-C  Fluticasone -Salmeterol (ADVAIR)  100-50 MCG/DOSE AEPB Inhale 1 puff into the lungs 2 (two) times daily. 12/06/19  Yes Marius Siemens, NP  hydrALAZINE  (APRESOLINE ) 25 MG tablet TAKE 1 TABLET BY MOUTH TWICE  DAILY 09/06/23  Yes Wendie Hamburg, MD  metoprolol  succinate (TOPROL -XL) 25 MG 24 hr tablet TAKE 1 TABLET BY  MOUTH DAILY  WITH A MEAL OR IMMEDIATELY AFTER THE SAME MEAL 11/29/23  Yes Wendie Hamburg, MD  pantoprazole  (PROTONIX ) 40 MG tablet TAKE 1 TABLET(40 MG) BY MOUTH TWICE DAILY 05/06/23  Yes Marius Siemens, NP  fluticasone  (FLONASE ) 50 MCG/ACT nasal spray SHAKE LIQUID AND USE 2 SPRAYS IN EACH NOSTRIL DAILY 06/10/22   Marius Siemens, NP  loratadine  (CLARITIN ) 10 MG tablet Take 1 tablet (10 mg total) by mouth daily. 10/25/23   Marius Siemens, NP  ondansetron  (ZOFRAN ) 8 MG tablet Take 8 mg by mouth 3 (three) times daily. 11/18/21   [provider]    No current facility-administered medications for this encounter.    Allergies as of 10/04/2023   (No Known Allergies)    Family History  Problem Relation Age of Onset   Breast cancer Paternal Aunt    Sudden Cardiac Death Neg Hx     Social History   Socioeconomic History   Marital status: Legally Separated    Spouse name: Not on file   Number of children: Not on file   Years of education: Not on file   Highest education level: Not on file  Occupational History   Not on file  Tobacco Use   Smoking status: Every Day    Current packs/day: 0.50    Types: Cigarettes   Smokeless tobacco: Never  Vaping Use   Vaping status: Never Used  Substance and Sexual Activity   Alcohol use: Yes    Comment: two 40 oz beers most days    Drug use: Not Currently    Types: Marijuana   Sexual activity: Not on file  Other Topics Concern   Not on file  Social History Narrative   Not on file   Social Drivers of Health   Financial Resource Strain: Not on file  Food Insecurity: Low Risk  (10/13/2023)   Received from [facility]   Hunger Vital Sign    Worried About Running Out of Food in the Last Year: Never true    Ran Out of Food in the Last Year: Never true  Transportation Needs: No Transportation Needs (10/13/2023)   Received from [facility]    In the past 12 months, has lack of reliable  transportation kept you from medical appointments, meetings, work or from getting things needed for daily living? : No  Physical Activity: Not on file  Stress: Not on file  Social Connections: Not on file  Intimate Partner Violence: Not on file    Physical Exam: Vital signs in last 24 hours: There were no vitals taken for this visit. GEN: NAD EYE: Sclerae anicteric ENT: MMM CV: Non-tachycardic Pulm: No increased WOB GI: Soft NEURO:  Alert & Oriented   Regino Caprio, MD [facility]   12/08/2023 8:08 AM

## 2023-12-09 ENCOUNTER — Telehealth: Payer: Self-pay | Admitting: Internal Medicine

## 2023-12-09 NOTE — Telephone Encounter (Signed)
 Attempted to call the patient twice without response.  Her home phone number rings indefinitely without any answer.  Her mobile phone appears to be not in service.  Will continue efforts to try to reach this patient.

## 2023-12-11 ENCOUNTER — Encounter (HOSPITAL_COMMUNITY): Payer: Self-pay | Admitting: Internal Medicine

## 2023-12-12 ENCOUNTER — Telehealth: Payer: Self-pay | Admitting: Internal Medicine

## 2023-12-12 LAB — SURGICAL PATHOLOGY

## 2023-12-12 NOTE — Telephone Encounter (Signed)
 Attempted to call the patient again about her pathology results from her upper endoscopy. I called her home phone number 3 times without response (not able to leave a voicemail) and her mobile number 2 times without response (not able to leave a voicemail). I then attempted to call her mother who is listed as her emergency contact [phone]), and was told by the person that answered that this was not the phone number of Veronica Stewart or Veronica Stewart. I then attempted to call Veronica Stewart her friend twice who did not respond.  Pod B triage, please continue attempts to reach [name] and let me know when she may be available over the phone.  Will CC her oncology team Dr. Marguerita Shih and [name] (who has an appt with the patient on 5/5) to make them aware of her new diagnosis of esophageal squamous cell cancer and that I have not been able to notify the patient of this diagnosis yet. However, I did tell the patient after her last EGD procedure that her esophageal mass was very likely esophageal cancer so this should not catch her by surprise entirely.

## 2023-12-13 NOTE — Telephone Encounter (Signed)
Unable to reach pt or leave voice message

## 2023-12-18 NOTE — Progress Notes (Addendum)
 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  2) persistent anemia that has been going on for few years. This is likely iron  deficiency anemia from gastrointestinal blood loss. The patient had no previous gastrointestinal workup or screening colonoscopy.   PRIOR THERAPY:  IV iron  infusions with Venofer  300 mg IV weekly x3, last dose on 05/17/23.   CURRENT THERAPY: Oral iron  supplements with integra plus  p.o. every other day   INTERVAL HISTORY: Veronica Stewart 68 y.o. female returns to the clinic today for a follow-up visit.  The patient establish care with Dr. Sherrod on 04/13/23 for persistent anemia.  She had significant anemia at that time with a hemoglobin of 7. Dr. Sherrod arrange for 1 unit of blood and an IV iron  infusion with Venofer  300 mg weekly x 3. Her most recent dose was on 05/17/2023. At her follow up on 07/11/24, the patient was not aware that she should be taking an oral iron  supplement daily. Therefore, she was sent in a prescription for iron . She started taking this and is compliant. She takes this every other day.    She had EGD by Dr. Federico from gastroenterology which showed large, fungating mass with no bleeding and stigmata of recent bleeding was found in the mid esophagus. The final pathology ([id number]) showed squamous cell carcinoma, invasive moderately differentiated squamous cell carcinoma.  The patient denies any pain or difficulty swallowing.  They were not able to reach the patient to relay the results of her esophageal cancer.  The patient states that she does not have a voicemail set up on her mobile phone and her preferred method of contact is her home phone number.  The patient has a history of nasal Squamous cell  carcinoma which she was treated at [facility].  Her ENT is still at [facility].  Regarding the diagnose esophageal cancer, she would be willing to see her prior medical oncologist and radiation oncologist at [facility] as she states that she goes in that direction frequently.   Overall, the patient states that she is feeling fine today without concerns. Regarding her energy and dyspnea on exertion, she sometimes may need to sit down and rest in between activities at home.  They have her colonoscopy, she did have some blood in the stool a few weeks ago but that has since subsided.  She has not seen any other visible bleeding since that time.  She sometimes has epistaxis in the left nostril but is usually self-limiting.  As previously mentioned she [facility]. She is here today for evaluation and repeat blood work.   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  MEDICATIONS:  Current Outpatient Medications  Medication Sig Dispense Refill   acetaminophen  (TYLENOL ) 500 MG tablet Take 500 mg by mouth every 6 (six) hours as needed for mild pain.     acetaminophen  (TYLENOL ) 650 MG CR tablet Take 650 mg by mouth every 8 (eight) hours as needed for pain.     albuterol  (VENTOLIN  HFA) 108 (90 Base) MCG/ACT  inhaler INHALE 2 PUFFS BY MOUTH EVERY 6 HOURS AS NEEDED FOR WHEEZING AND SHORTNESS OF BREATH 18 g 2   amLODipine  (NORVASC ) 10 MG tablet Take 1 tablet (10 mg total) by mouth daily. 90 tablet 3   aspirin  EC 81 MG tablet Take 81 mg by mouth daily. Swallow whole.     DEEP SEA NASAL SPRAY 0.65 % nasal spray SMARTSIG:Both Nares     FeFum-FePoly-FA-B Cmp-C-Biot (INTEGRA PLUS ) CAPS Take 1 capsule by mouth every morning. 30 capsule 2   fluticasone  (FLONASE ) 50 MCG/ACT nasal spray SHAKE LIQUID  AND USE 2 SPRAYS IN EACH NOSTRIL DAILY 48 g 0   Fluticasone -Salmeterol (ADVAIR) 100-50 MCG/DOSE AEPB Inhale 1 puff into the lungs 2 (two) times daily. 1 each 3   hydrALAZINE  (APRESOLINE ) 25 MG tablet TAKE 1 TABLET BY MOUTH TWICE  DAILY 200 tablet 2   loratadine  (CLARITIN ) 10 MG tablet Take 1 tablet (10 mg total) by mouth daily. 30 tablet 11   metoprolol  succinate (TOPROL -XL) 25 MG 24 hr tablet TAKE 1 TABLET BY MOUTH DAILY  WITH A MEAL OR IMMEDIATELY AFTER THE SAME MEAL 100 tablet 3   ondansetron  (ZOFRAN ) 8 MG tablet Take 8 mg by mouth 3 (three) times daily.     pantoprazole  (PROTONIX ) 40 MG tablet TAKE 1 TABLET(40 MG) BY MOUTH TWICE DAILY 180 tablet 3   No current facility-administered medications for this visit.    SURGICAL HISTORY:  Past Surgical History:  Procedure Laterality Date   ABDOMINAL AORTOGRAM W/LOWER EXTREMITY Bilateral 03/05/2020   Procedure: ABDOMINAL AORTOGRAM W/LOWER EXTREMITY;  Surgeon: Gretta Lonni PARAS, MD;  Location: [facility] INVASIVE CV LAB;  Service: Cardiovascular;  Laterality: Bilateral;   COLONOSCOPY WITH PROPOFOL  N/A 12/08/2023   Procedure: COLONOSCOPY WITH PROPOFOL ;  Surgeon: Federico Rosario BROCKS, MD;  Location: [facility] ENDOSCOPY;  Service: Gastroenterology;  Laterality: N/A;   ENDARTERECTOMY FEMORAL Left 03/24/2020   Procedure: LEFT COMMON FEMORAL ENDARTERECTOMY;  Surgeon: Gretta Lonni PARAS, MD;  Location: [facility] OR;  Service: Vascular;  Laterality: Left;   ENDOVEIN HARVEST OF GREATER SAPHENOUS VEIN Left 03/24/2020   Procedure: HARVEST OF GREATER SAPHENOUS VEIN;  Surgeon: Gretta Lonni PARAS, MD;  Location: [facility] OR;  Service: Vascular;  Laterality: Left;   ESOPHAGOGASTRODUODENOSCOPY (EGD) WITH PROPOFOL  N/A 12/08/2023   Procedure: ESOPHAGOGASTRODUODENOSCOPY (EGD) WITH PROPOFOL ;  Surgeon: Federico Rosario BROCKS, MD;  Location: [facility] ENDOS[facility];  Service: Gastroenterology;  Laterality: N/A;   FEMORAL-POPLITEAL BYPASS GRAFT Left 03/24/2020   Procedure: BYPASS GRAFT FEMORAL-POPLITEAL ARTERY;  Surgeon:  Gretta Lonni PARAS, MD;  Location: [facility] OR;  Service: Vascular;  Laterality: Left;   INSERTION OF ILIAC STENT Left 03/24/2020   Procedure: INSERTION OF RETROGRADE ILIAC STENT;  Surgeon: Gretta Lonni PARAS, MD;  Location: [facility] OR;  Service: Vascular;  Laterality: Left;   INTRAMEDULLARY (IM) NAIL INTERTROCHANTERIC Left 03/18/2018   Procedure: INTRAMEDULLARY (IM) NAIL INTERTROCHANTRIC;  Surgeon: Kendal Franky SQUIBB, MD;  Location: [facility] OR;  Service: Orthopedics;  Laterality: Left;   INTRAOPERATIVE ARTERIOGRAM Left 03/24/2020   Procedure: INTRA OPERATIVE ARTERIOGRAM;  Surgeon: Gretta Lonni PARAS, MD;  Location: [facility] OR;  Service: Vascular;  Laterality: Left;    REVIEW OF SYSTEMS:   Review of Systems  Constitutional: Positive for occasional fatigue. Negative for appetite change, chills, fever, and unexpected weight change  HENT: Occasional epistaxis. Negative for mouth sores, sore throat and trouble swallowing.   Eyes: Negative for eye problems and icterus.  Respiratory: Positive for occasional dyspnea on exertion. Negative for cough, hemoptysis, and wheezing.   Cardiovascular: Negative for chest  pain and leg swelling.  Gastrointestinal: Negative for abdominal pain, constipation, diarrhea, nausea and vomiting.  Genitourinary: Negative for bladder incontinence, difficulty urinating, dysuria, frequency and hematuria.   Musculoskeletal: Negative for back pain, gait problem, neck pain and neck stiffness.  Skin: Negative for itching and rash.  Neurological: Negative for dizziness, extremity weakness, gait problem, headaches, light-headedness and seizures.  Hematological: Negative for adenopathy. Does not bruise/bleed easily.  Psychiatric/Behavioral: Negative for confusion, depression and sleep disturbance. The patient is not nervous/anxious.     PHYSICAL EXAMINATION:  There were no vitals taken for this visit.  ECOG PERFORMANCE STATUS: 1  Physical Exam  Constitutional: Oriented to person, place, and time  and thin appearing female and in no distress.  HENT:  Head: Positive for deformity due to prior right squamous cell carcinoma/status post resection and chemoradiation in the right nasal passage. Eyes: Conjunctivae are normal. Right eye exhibits no discharge. Left eye exhibits no discharge. No scleral icterus.  Neck: Normal range of motion. Neck supple.  Cardiovascular: Normal rate, regular rhythm, normal heart sounds and intact distal pulses.   Pulmonary/Chest: Effort normal and breath sounds normal. No respiratory distress. No wheezes. No rales.  Abdominal: Soft. Bowel sounds are normal. Exhibits no distension and no mass. There is no tenderness.  Musculoskeletal: Normal range of motion. Exhibits no edema.  Lymphadenopathy:    No cervical adenopathy.  Neurological: Alert and oriented to person, place, and time. Exhibits muscle wasting. Gait normal. Coordination normal. Ambulates with a cane.  Skin: Skin is warm and dry. No rash noted. Not diaphoretic. No erythema. No pallor.  Psychiatric: Mood, memory and judgment normal.  Vitals reviewed.  LABORATORY DATA: Lab Results  Component Value Date   WBC 5.1 09/20/2023   HGB 12.8 09/20/2023   HCT 40.6 09/20/2023   MCV 86.0 09/20/2023   PLT 265 09/20/2023      Chemistry      Component Value Date/Time   NA 138 10/12/2023 1522   K 5.0 10/12/2023 1522   CL 99 10/12/2023 1522   CO2 24 10/12/2023 1522   BUN 15 10/12/2023 1522   CREATININE 1.60 (H) 10/12/2023 1522   CREATININE 1.41 (H) 04/13/2023 1139      Component Value Date/Time   CALCIUM  9.5 10/12/2023 1522   ALKPHOS 65 10/12/2023 1522   AST 20 10/12/2023 1522   AST 18 04/13/2023 1139   ALT 7 10/12/2023 1522   ALT 10 04/13/2023 1139   BILITOT 0.5 10/12/2023 1522   BILITOT 0.4 04/13/2023 1139       RADIOGRAPHIC STUDIES:  No results found.   ASSESSMENT/PLAN:  This is a very pleasant 69 year old African-American female with a history of nasal squamous cell carcinoma status  post resection followed by concurrent chemoradiation.  She was treated at [facility] in 2022.   She is being seen by our clinic for iron  deficiency anemia.   The patient received IV iron  with Venofer  300 mg weekly x 3 last dose in October 2024.    She is currently taking oral iron  supplements with integra plus  and she is compliant with taking this either daily or every other day.    The patient was seen with Dr. Sherrod today.  Labs were reviewed. Her Hbg is 8.6. Her iron  studies show worsening IDA. I will arrange for IVF with venofer . The ferritin is pending.   She will continue taking her oral iron  supplement p.o. daily and every other day.   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 She previously was treated at [facility] for squamous cell carcinoma of the nasal area. We will re-refer her to her primary team for the newly diagnosed squamous cell carcinoma of the esophagus with her medical oncologist and radiation oncologist at [facility]. We reviewed the pathology with her today. I confirmed her preferred contact number is her home phone number.   I gave the patient a copy of our card and I asked her to call us  if she has not heard from [facility] in 1 week to schedule a consultation.  I advised her to check her phone diligently to ensure she is not missing a call from them.  I let her know that her gastroenterologist had tried calling her several times to review the pathology and to ensure that she is checking her phone and set up voicemail.  The patient was advised to call immediately if she has any concerning symptoms in the interval. The patient voices understanding of current disease status and treatment options and is in agreement with the current care plan. All questions were answered. The patient knows to call the clinic with any problems, questions or concerns. We can certainly see the patient much sooner if necessary    Ransome Helwig L  Shariff Lasky, PA-C 12/18/23  ADDENDUM: Hematology/Oncology Attending:  I had a face-to-face encounter with the patient today.  I reviewed her records, lab, scans as well as recent pathology report and recommended her care plan.  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  We have been seeing the patient for iron  deficiency anemia from gastrointestinal blood loss.  She received iron  infusion with Venofer  in October 2024.  During her gastrointestinal evaluation for the source of bleeding she was seen by Dr. Federico from [facility] gastroenterology and EGD showed large fungating mass with no bleeding in the mid third of the esophagus.  The biopsy from this area was consistent with invasive moderately differentiated squamous cell carcinoma.  The patient denied having any dysphagia or odynophagia.  She was referred back to us  for evaluation and recommendation regarding management of her condition.  She also had a colonoscopy that showed some blood in her stool but no significant lesion.  She had few episodes of epistaxis from the left nostril that is self-limiting and she was seen by ENT at [facility]. I had a lengthy discussion with the patient today about her current recent diagnosis of esophageal squamous cell carcinoma.  I gave the patient the option of management at the [facility] but she is interested to see her medical oncologist at [facility] who had taken care of her in the past.  We will make the referral to see her medical oncologist at [facility].  If the patient decided to stay in [facility] with [place], we will order a PET scan as well as endoscopic ultrasound for further evaluation of this lesion and to see if the patient is a surgical candidate for resection or just a  course of concurrent chemoradiation.  We will arrange for the patient a follow-up appointment with us  in 2 months to follow-up on her referral and management at [facility] and also to check her iron  studies again. The patient was advised to call immediately if she has any other concerning symptoms in the interval. The total time spent  in the appointment was 30 minutes. Disclaimer: This note was dictated with voice recognition software. Similar sounding words can inadvertently be transcribed and may be missed upon review. Sherrod MARLA Sherrod, MD

## 2023-12-19 ENCOUNTER — Telehealth (INDEPENDENT_AMBULATORY_CARE_PROVIDER_SITE_OTHER): Payer: Self-pay

## 2023-12-19 ENCOUNTER — Inpatient Hospital Stay (HOSPITAL_BASED_OUTPATIENT_CLINIC_OR_DEPARTMENT_OTHER): Payer: 59 | Admitting: Physician Assistant

## 2023-12-19 ENCOUNTER — Inpatient Hospital Stay: Payer: 59 | Attending: Internal Medicine

## 2023-12-19 VITALS — BP 149/94 | HR 64 | Temp 97.3°F | Resp 13 | Wt 98.3 lb

## 2023-12-19 DIAGNOSIS — Z79899 Other long term (current) drug therapy: Secondary | ICD-10-CM | POA: Insufficient documentation

## 2023-12-19 DIAGNOSIS — D500 Iron deficiency anemia secondary to blood loss (chronic): Secondary | ICD-10-CM | POA: Diagnosis not present

## 2023-12-19 DIAGNOSIS — F17210 Nicotine dependence, cigarettes, uncomplicated: Secondary | ICD-10-CM | POA: Insufficient documentation

## 2023-12-19 DIAGNOSIS — K922 Gastrointestinal hemorrhage, unspecified: Secondary | ICD-10-CM | POA: Insufficient documentation

## 2023-12-19 DIAGNOSIS — Z8522 Personal history of malignant neoplasm of nasal cavities, middle ear, and accessory sinuses: Secondary | ICD-10-CM | POA: Insufficient documentation

## 2023-12-19 DIAGNOSIS — C154 Malignant neoplasm of middle third of esophagus: Secondary | ICD-10-CM

## 2023-12-19 DIAGNOSIS — D509 Iron deficiency anemia, unspecified: Secondary | ICD-10-CM

## 2023-12-19 LAB — CBC WITH DIFFERENTIAL (CANCER CENTER ONLY)
Abs Immature Granulocytes: 0.01 10*3/uL (ref 0.00–0.07)
Basophils Absolute: 0 10*3/uL (ref 0.0–0.1)
Basophils Relative: 1 %
Eosinophils Absolute: 0.7 10*3/uL — ABNORMAL HIGH (ref 0.0–0.5)
Eosinophils Relative: 9 %
HCT: 26.3 % — ABNORMAL LOW (ref 36.0–46.0)
Hemoglobin: 8.6 g/dL — ABNORMAL LOW (ref 12.0–15.0)
Immature Granulocytes: 0 %
Lymphocytes Relative: 13 %
Lymphs Abs: 1 10*3/uL (ref 0.7–4.0)
MCH: 27.8 pg (ref 26.0–34.0)
MCHC: 32.7 g/dL (ref 30.0–36.0)
MCV: 85.1 fL (ref 80.0–100.0)
Monocytes Absolute: 0.8 10*3/uL (ref 0.1–1.0)
Monocytes Relative: 11 %
Neutro Abs: 4.9 10*3/uL (ref 1.7–7.7)
Neutrophils Relative %: 66 %
Platelet Count: 370 10*3/uL (ref 150–400)
RBC: 3.09 MIL/uL — ABNORMAL LOW (ref 3.87–5.11)
RDW: 16.3 % — ABNORMAL HIGH (ref 11.5–15.5)
WBC Count: 7.4 10*3/uL (ref 4.0–10.5)
nRBC: 0 % (ref 0.0–0.2)

## 2023-12-19 LAB — IRON AND IRON BINDING CAPACITY (CC-WL,HP ONLY)
Iron: 24 ug/dL — ABNORMAL LOW (ref 28–170)
Saturation Ratios: 7 % — ABNORMAL LOW (ref 10.4–31.8)
TIBC: 354 ug/dL (ref 250–450)
UIBC: 330 ug/dL (ref 148–442)

## 2023-12-19 LAB — SAMPLE TO BLOOD BANK

## 2023-12-19 NOTE — Patient Instructions (Signed)
esop

## 2023-12-19 NOTE — Telephone Encounter (Signed)
 Patient was last seen 10/25/2023. Will send to Veronica Stewart once paperwork is received and once I am back in the office which is tomorrow

## 2023-12-19 NOTE — Telephone Encounter (Signed)
 Copied from CRM [id number]. Topic: General - Other >> Dec 16, 2023  3:49 PM Felizardo Hotter wrote: Reason for CRM: Received call from [facility] per Ms. Cotton ph: [phone] requesting that paperwork is completed and faxed to: [place] [facility]. She will send via fax.

## 2023-12-20 ENCOUNTER — Telehealth: Payer: Self-pay

## 2023-12-20 ENCOUNTER — Other Ambulatory Visit: Payer: Self-pay

## 2023-12-20 ENCOUNTER — Telehealth: Payer: Self-pay | Admitting: Pharmacy Technician

## 2023-12-20 ENCOUNTER — Encounter: Payer: Self-pay | Admitting: Physician Assistant

## 2023-12-20 LAB — FERRITIN: Ferritin: 25 ng/mL (ref 11–307)

## 2023-12-20 NOTE — Progress Notes (Signed)
 error

## 2023-12-20 NOTE — Telephone Encounter (Signed)
 Auth Submission: NO AUTH NEEDED Site of care: Site of care: [facility] Payer: [employer] dual Medication & CPT/J Code(s) submitted: Venofer  (Iron  Sucrose) J1756 Route of submission (phone, fax, portal):  Phone # Fax # Auth type: Buy/Bill PB Units/visits requested: 3 doses Reference number:  Approval from: 12/20/23 to 04/21/24

## 2023-12-20 NOTE — Telephone Encounter (Signed)
 Faxed re-referral (plus extra documents) to Dr. Frosty Jews in radiation oncology at [facility] with confirmation at [phone].  Faxed re-referral to Dr. Laray Platt in medical oncology at [facility] with confirmation at [phone].

## 2023-12-20 NOTE — Telephone Encounter (Signed)
 Spoke with patient this morning regarding lab results. Per Cassie, PA, the patient's iron  levels are low. Orders have been placed for IV iron  at the [facility]. Informed patient that the infusion center will contact them to schedule an appointment. Patient verbalized understanding.

## 2023-12-20 NOTE — Addendum Note (Signed)
 Addended by: Terisa Fern on: 12/20/2023 03:38 PM   Modules accepted: Orders

## 2023-12-20 NOTE — Telephone Encounter (Signed)
 Veronica Stewart, patient will be scheduled as soon as possible.  Auth Submission: NO AUTH NEEDED Site of care: Site of care: [facility] Payer: [employer] dual complete medicare Medication & CPT/J Code(s) submitted: Venofer  (Iron  Sucrose) J1756 Route of submission (phone, fax, portal):  Phone # Fax # Auth type: Buy/Bill PB Units/visits requested: 200mg  x 3 doses Reference number:  Approval from: 12/20/23 to 04/21/24

## 2023-12-21 NOTE — Telephone Encounter (Signed)
 Form has been received and it will be sent back once completed

## 2023-12-21 NOTE — Telephone Encounter (Signed)
 Veronica Stewart w/ [facility] requesting for paperwork to be expedited. Advised of turnaround time for paperwork. Veronica Stewart states she will follow up accordingly.

## 2023-12-21 NOTE — Telephone Encounter (Signed)
 Ms Veronica [facility] from [facility], called for status if paperwork for PCs service has been sent to [facility]. Please cb

## 2023-12-22 NOTE — Telephone Encounter (Signed)
 Pcs form has been emailed to [employer]

## 2023-12-27 ENCOUNTER — Encounter: Payer: Self-pay | Admitting: Podiatry

## 2023-12-27 ENCOUNTER — Ambulatory Visit (INDEPENDENT_AMBULATORY_CARE_PROVIDER_SITE_OTHER): Payer: 59 | Admitting: Podiatry

## 2023-12-27 DIAGNOSIS — Z7901 Long term (current) use of anticoagulants: Secondary | ICD-10-CM | POA: Diagnosis not present

## 2023-12-27 DIAGNOSIS — B351 Tinea unguium: Secondary | ICD-10-CM

## 2023-12-27 DIAGNOSIS — Q828 Other specified congenital malformations of skin: Secondary | ICD-10-CM | POA: Diagnosis not present

## 2023-12-27 DIAGNOSIS — M79675 Pain in left toe(s): Secondary | ICD-10-CM

## 2023-12-27 DIAGNOSIS — M79674 Pain in right toe(s): Secondary | ICD-10-CM | POA: Diagnosis not present

## 2023-12-27 NOTE — Progress Notes (Signed)
 This patient returns to my office for at risk foot care.  This patient requires this care by a professional since this patient will be at risk due to having PAD and coagulation defect due to eliquis .  This patient is unable to cut nails herself since the patient cannot reach her nails.These nails are painful walking and wearing shoes.  She has painful callus both feet.. This patient presents for at risk foot care today.  General Appearance  Alert, conversant and in no acute stress.  Vascular  Dorsalis pedis and posterior tibial  pulses are palpable  bilaterally.  Capillary return is within normal limits  bilaterally. Temperature is within normal limits  bilaterally.  Neurologic  Senn-Weinstein monofilament wire test within normal limits  bilaterally. Muscle power within normal limits bilaterally.  Nails Thick disfigured discolored nails with subungual debris  from hallux to fifth toes left and second to fifth toes right foot.. No evidence of bacterial infection or drainage bilaterally.  Orthopedic  No limitations of motion  feet .  No crepitus or effusions noted.  No bony pathology or digital deformities noted. Plantar exostosis  base fifth  B/L asymptomatic.  Skin  normotropic skin noted bilaterally.  No signs of infections or ulcers noted.    Onychomycosis  Pain in right toes  Pain in left toes     Consent was obtained for treatment procedures.   Mechanical debridement of nails 1-5 left and 2-5 right foot.performed with a nail nipper.  Filed with dremel without incident.     Return office visit   3 months                   Told patient to return for periodic foot care and evaluation due to potential at risk complications.   Ruffin Cotton DPM

## 2023-12-27 NOTE — Telephone Encounter (Signed)
 noted

## 2023-12-29 NOTE — Telephone Encounter (Signed)
 PCS form Faxed confirmation received.

## 2024-01-02 ENCOUNTER — Ambulatory Visit

## 2024-01-04 ENCOUNTER — Ambulatory Visit (INDEPENDENT_AMBULATORY_CARE_PROVIDER_SITE_OTHER)

## 2024-01-04 VITALS — BP 139/83 | HR 68 | Temp 97.5°F | Resp 16 | Ht 69.0 in | Wt 99.4 lb

## 2024-01-04 DIAGNOSIS — D509 Iron deficiency anemia, unspecified: Secondary | ICD-10-CM | POA: Diagnosis not present

## 2024-01-04 DIAGNOSIS — D500 Iron deficiency anemia secondary to blood loss (chronic): Secondary | ICD-10-CM

## 2024-01-04 MED ORDER — ACETAMINOPHEN 325 MG PO TABS
650.0000 mg | ORAL_TABLET | Freq: Once | ORAL | Status: AC
  Administered 2024-01-04: 650 mg via ORAL
  Filled 2024-01-04: qty 2

## 2024-01-04 MED ORDER — DIPHENHYDRAMINE HCL 25 MG PO CAPS
25.0000 mg | ORAL_CAPSULE | Freq: Once | ORAL | Status: AC
  Administered 2024-01-04: 25 mg via ORAL
  Filled 2024-01-04: qty 1

## 2024-01-04 MED ORDER — IRON SUCROSE 20 MG/ML IV SOLN
200.0000 mg | Freq: Once | INTRAVENOUS | Status: AC
  Administered 2024-01-04: 200 mg via INTRAVENOUS
  Filled 2024-01-04: qty 10

## 2024-01-04 NOTE — Progress Notes (Signed)
 Diagnosis: Iron Deficiency Anemia  Provider:  Chilton Greathouse MD  Procedure: IV Push  IV Type: Peripheral, IV Location: R Forearm  Venofer (Iron Sucrose), Dose: 200 mg  Post Infusion IV Care: Observation period completed and Peripheral IV Discontinued  Discharge: Condition: Good, Destination: Home . AVS Declined  Performed by:  Loney Hering, LPN

## 2024-01-06 ENCOUNTER — Ambulatory Visit (INDEPENDENT_AMBULATORY_CARE_PROVIDER_SITE_OTHER): Admitting: *Deleted

## 2024-01-06 VITALS — BP 146/79 | HR 65 | Temp 96.2°F | Resp 17 | Ht 69.0 in | Wt 99.0 lb

## 2024-01-06 DIAGNOSIS — D509 Iron deficiency anemia, unspecified: Secondary | ICD-10-CM | POA: Diagnosis not present

## 2024-01-06 DIAGNOSIS — D500 Iron deficiency anemia secondary to blood loss (chronic): Secondary | ICD-10-CM

## 2024-01-06 MED ORDER — ACETAMINOPHEN 325 MG PO TABS: 650.0000 mg | ORAL_TABLET | Freq: Once | ORAL | Status: DC

## 2024-01-06 MED ORDER — IRON SUCROSE 20 MG/ML IV SOLN
200.0000 mg | Freq: Once | INTRAVENOUS | Status: AC
  Administered 2024-01-06: 200 mg via INTRAVENOUS
  Filled 2024-01-06: qty 10

## 2024-01-06 MED ORDER — DIPHENHYDRAMINE HCL 25 MG PO CAPS: 25.0000 mg | ORAL_CAPSULE | Freq: Once | ORAL | Status: DC

## 2024-01-06 NOTE — Progress Notes (Signed)
 Diagnosis: Acute Anemia  Provider:  Mannam, Praveen MD  Procedure: IV Push  IV Type: Peripheral, IV Location: R Forearm  Venofer  (Iron  Sucrose), Dose: 200 mg  Post Infusion IV Care: Observation period completed  Discharge: Condition: Good, Destination: Home . AVS Provided  Performed by:  Devonne Folk, RN

## 2024-01-10 ENCOUNTER — Ambulatory Visit

## 2024-01-10 VITALS — BP 152/87 | HR 63 | Temp 98.0°F | Resp 16 | Ht 69.0 in | Wt 100.0 lb

## 2024-01-10 DIAGNOSIS — D509 Iron deficiency anemia, unspecified: Secondary | ICD-10-CM

## 2024-01-10 DIAGNOSIS — D500 Iron deficiency anemia secondary to blood loss (chronic): Secondary | ICD-10-CM

## 2024-01-10 MED ORDER — ACETAMINOPHEN 325 MG PO TABS
650.0000 mg | ORAL_TABLET | Freq: Once | ORAL | Status: AC
  Administered 2024-01-10: 650 mg via ORAL
  Filled 2024-01-10: qty 2

## 2024-01-10 MED ORDER — DIPHENHYDRAMINE HCL 25 MG PO CAPS
25.0000 mg | ORAL_CAPSULE | Freq: Once | ORAL | Status: AC
  Administered 2024-01-10: 25 mg via ORAL
  Filled 2024-01-10: qty 1

## 2024-01-10 MED ORDER — IRON SUCROSE 20 MG/ML IV SOLN
200.0000 mg | Freq: Once | INTRAVENOUS | Status: AC
  Administered 2024-01-10: 200 mg via INTRAVENOUS
  Filled 2024-01-10: qty 10

## 2024-01-10 NOTE — Progress Notes (Signed)
 Diagnosis: Acute Anemia  Provider:  Chilton Greathouse MD  Procedure: IV Push  IV Type: Peripheral, IV Location: R Forearm  Venofer (Iron Sucrose), Dose: 200 mg  Post Infusion IV Care: Observation period completed and Peripheral IV Discontinued  Discharge: Condition: Good, Destination: Home . AVS Declined  Performed by:  Nat Math, RN

## 2024-01-17 ENCOUNTER — Ambulatory Visit: Payer: 59 | Attending: Cardiology | Admitting: Cardiology

## 2024-01-17 ENCOUNTER — Encounter: Payer: Self-pay | Admitting: Cardiology

## 2024-01-17 VITALS — BP 106/90 | HR 71 | Ht 69.0 in | Wt 98.8 lb

## 2024-01-17 DIAGNOSIS — Z720 Tobacco use: Secondary | ICD-10-CM | POA: Diagnosis not present

## 2024-01-17 DIAGNOSIS — I4892 Unspecified atrial flutter: Secondary | ICD-10-CM | POA: Diagnosis not present

## 2024-01-17 DIAGNOSIS — I10 Essential (primary) hypertension: Secondary | ICD-10-CM | POA: Diagnosis not present

## 2024-01-17 DIAGNOSIS — E785 Hyperlipidemia, unspecified: Secondary | ICD-10-CM | POA: Diagnosis not present

## 2024-01-17 DIAGNOSIS — I5032 Chronic diastolic (congestive) heart failure: Secondary | ICD-10-CM | POA: Diagnosis not present

## 2024-01-17 NOTE — Patient Instructions (Signed)
 Medication Instructions:  Continue current medications *If you need a refill on your cardiac medications before your next appointment, please call your pharmacy*  Lab Work: none If you have labs (blood work) drawn today and your tests are completely normal, you will receive your results only by: MyChart Message (if you have MyChart) OR A paper copy in the mail If you have any lab test that is abnormal or we need to change your treatment, we will call you to review the results.  Testing/Procedures: none  Follow-Up: At [facility], you and your health needs are our priority.  As part of our continuing mission to provide you with exceptional heart care, our providers are all part of one team.  This team includes your primary Cardiologist (physician) and Advanced Practice Providers or APPs (Physician Assistants and Nurse Practitioners) who all work together to provide you with the care you need, when you need it.  Your next appointment:   6 month(s)  Provider:   Wendie Hamburg, MD    We recommend signing up for the patient portal called "MyChart".  Sign up information is provided on this After Visit Summary.  MyChart is used to connect with patients for Virtual Visits (Telemedicine).  Patients are able to view lab/test results, encounter notes, upcoming appointments, etc.  Non-urgent messages can be sent to your provider as well.   To learn more about what you can do with MyChart, go to [email or web address].   Other Instructions none

## 2024-01-17 NOTE — Progress Notes (Signed)
 Cardiology Office Note:    Date:  01/17/2024   ID:  [name], DOB 10/15/1955, MRN [id number]  PCP:  Marius Siemens, NP  Cardiologist:  Wendie Hamburg, MD  Electrophysiologist:  None   Referring MD: Marius Siemens, NP   Chief Complaint  Patient presents with   Atrial Flutter     History of Present Illness:    Veronica Stewart is a 68 y.o. female with a hx of atrial flutter, PAD status post stent pop bypass, hypertension, asthma who presents for follow-up.  She was admitted to [facility] on 02/11/2021 with epistaxis and new onset atrial flutter.  Anticoagulation was initially held due to her epistaxis and she was evaluated by ENT.  Underwent packing in epistaxis resolved, and she was started on Eliquis , which he tolerated well.  She had been on aspirin /Plavix  for her PAD given her left external iliac stenting in August 2021.  She was transitioned to Eliquis  monotherapy.  Echocardiogram 02/13/2020 showed EF 40 to 45%.  Coronary CTA 04/14/2021 showed calcium  score 234 (93rd percentile), moderate nonobstructive CAD by CT FFR.  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  She completed adjuvant chemotherapy and radiation.  Echocardiogram 02/22/2022 showed EF 55 to 60%, normal diastolic function, normal RV function, RVSP 41 mmHg, mild MR, mild to moderate TR.  Zio patch was worn for 9 days 02/2022 the only had 1 day of analysis time, no significant arrhythmias were seen.  Echocardiogram 03/28/2023 showed EF 60 to 65%, normal RV function, moderately elevated pulmonary pressures, moderate biatrial enlargement, no significant valvular disease.   Since last clinic visit, she reports she is doing okay.  Recently found to have esophageal mass, she is following up with oncology at [facility].  Denies any chest pain, dyspnea, lightheadedness, syncope, or palpitations.  Does report some LE edema, but uses compression stockings.  Smoking 7-8 cigarettes  per day.  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[facility])     Past Surgical History:  Procedure Laterality Date   ABDOMINAL AORTOGRAM W/LOWER EXTREMITY Bilateral 03/05/2020   Procedure: ABDOMINAL AORTOGRAM W/LOWER EXTREMITY;  Surgeon: Young Hensen, MD;  Location: [facility] INVASIVE CV LAB;  Service: Cardiovascular;  Laterality: Bilateral;   COLONOSCOPY WITH PROPOFOL  N/A 12/08/2023   Procedure: COLONOSCOPY WITH PROPOFOL ;  Surgeon: Daina Drum, MD;  Location: [facility] ENDOSCOPY;  Service: Gastroenterology;  Laterality: N/A;   ENDARTERECTOMY FEMORAL Left 03/24/2020   Procedure: LEFT COMMON FEMORAL ENDARTERECTOMY;  Surgeon: Young Hensen, MD;  Location: [facility] OR;  Service: Vascular;  Laterality: Left;   ENDOVEIN HARVEST OF GREATER SAPHENOUS VEIN Left 03/24/2020   Procedure: HARVEST OF GREATER SAPHENOUS VEIN;  Surgeon: Young Hensen, MD;  Location: [facility] OR;  Service: Vascular;  Laterality: Left;   ESOPHAGOGASTRODUODENOSCOPY (EGD) WITH PROPOFOL  N/A 12/08/2023   Procedure: ESOPHAGOGASTRODUODENOSCOPY (EGD) WITH PROPOFOL ;  Surgeon: Daina Drum, MD;  Location: [facility] ENDOSCOPY;  Service: Gastroenterology;  Laterality: N/A;   FEMORAL-POPLITEAL BYPASS GRAFT Left 03/24/2020   Procedure: BYPASS GRAFT FEMORAL-POPLITEAL ARTERY;  Surgeon: Young Hensen, MD;  Location: [facility] OR;  Service: Vascular;  Laterality: Left;   INSERTION OF ILIAC STENT Left 03/24/2020   Procedure: INSERTION OF RETROGRADE ILIAC STENT;  Surgeon: Young Hensen, MD;  Location: [facility] OR;  Service: Vascular;  Laterality: Left;   INTRAMEDULLARY (IM) NAIL INTERTROCHANTERIC Left 03/18/2018   Procedure: INTRAMEDULLARY (IM) NAIL INTERTROCHANTRIC;  Surgeon: Laneta Pintos, MD;  Location: [facility] OR;  Service: Orthopedics;  Laterality: Left;   INTRAOPERATIVE ARTERIOGRAM Left 03/24/2020   Procedure:  INTRA OPERATIVE ARTERIOGRAM;  Surgeon: Young Hensen, MD;  Location: [facility] OR;  Service: Vascular;  Laterality: Left;    Current Medications: Current Meds  Medication Sig   acetaminophen  (TYLENOL ) 500 MG tablet Take 500 mg by mouth every 6 (six) hours as needed for mild pain.   amLODipine  (NORVASC ) 10 MG tablet Take 1 tablet (10 mg total) by mouth daily.   aspirin  EC 81 MG tablet Take 81 mg by mouth daily. Swallow whole.   DEEP SEA NASAL SPRAY 0.65 % nasal spray SMARTSIG:Both Nares   Emollient (EUCERIN INTENSIVE REPAIR) LOTN Apply 1 Application topically daily at 6 (six) AM.   FeFum-FePoly-FA-B Cmp-C-Biot (INTEGRA PLUS ) CAPS Take 1 capsule by mouth every morning.   fluticasone  (FLONASE ) 50 MCG/ACT nasal spray SHAKE LIQUID AND USE 2 SPRAYS IN EACH NOSTRIL DAILY   Fluticasone -Salmeterol (ADVAIR) 100-50 MCG/DOSE AEPB Inhale 1 puff into the lungs 2 (two) times daily.   hydrALAZINE  (APRESOLINE ) 25 MG tablet TAKE 1 TABLET BY MOUTH TWICE  DAILY   loratadine  (CLARITIN ) 10 MG tablet Take 1 tablet (10 mg total) by mouth daily.   metoprolol  succinate (TOPROL -XL) 25 MG 24 hr tablet TAKE 1 TABLET BY MOUTH DAILY  WITH A MEAL OR IMMEDIATELY AFTER THE SAME MEAL   pantoprazole  (PROTONIX ) 40 MG tablet TAKE 1 TABLET(40 MG) BY MOUTH TWICE DAILY     Allergies:   Patient has no known allergies.   Social History   Socioeconomic History   Marital status: Legally Separated    Spouse name: Not on file   Number of children: Not on file   Years of education: Not on file   Highest education level: Not on file  Occupational History   Not on file  Tobacco Use   Smoking status: Every Day    Current packs/day: 0.50    Types: Cigarettes   Smokeless tobacco: Never   Tobacco comments:    01/17/2024 Patient smokes about 6-7 cigarettes daily  Vaping Use   Vaping status: Never Used  Substance and Sexual Activity   Alcohol use: Yes    Comment: two 40 oz beers most days    Drug use: Not Currently    Types:  Marijuana   Sexual activity: Not on file  Other Topics Concern   Not on file  Social History Narrative   Not on file   Social Drivers of Health   Financial Resource Strain: Not on file  Food Insecurity: Low Risk  (10/13/2023)   Received from [facility]   Hunger Vital Sign    Worried About Running Out of Food in the Last Year: Never true    Ran Out of Food in the Last Year: Never true  Transportation Needs: No Transportation Needs (10/13/2023)   Received from [employer]    In the past 12 months, has lack of reliable transportation kept you from medical appointments, meetings, work or from getting things needed for daily living? : No  Physical Activity: Not on file  Stress: Not on file  Social Connections: Not on file     Family History: [name] includes Breast cancer in her paternal aunt. There is no history of Sudden Cardiac Death.  ROS:   Please see the  history of present illness.      All other systems reviewed and are negative.  EKGs/Labs/Other Studies Reviewed:    The following studies were reviewed today:   EKG:   01/17/2024: Normal sinus rhythm, rate 71, Q waves in V1-4 09/19/23: Sinus bradycardia, first-degree AV block, rate 54 03/03/2023: Sinus bradycardia, rate 58, poor R wave progression, no ST abnormalities, low voltage 03/23/22: Sinus rhythm, rate 60, first-degree AV block, poor R wave progression, Q waves in V1-3 02/09/2022: Sinus bradycardia, rate 55, Q waves in V1-3 05/11/21: Normal sinus rhythm, rate 73, Q waves V1-4 07/22:sinus rhythm, rate 63, Q-wave V1-V4  Recent Labs: 10/12/2023: ALT 7; BNP 269.9; BUN 15; Creatinine, Ser 1.60; Potassium 5.0; Sodium 138; TSH 9.970 12/19/2023: Hemoglobin 8.6; Platelet Count 370  Recent Lipid Panel    Component Value Date/Time   CHOL 215 (H) 09/20/2023 0933   TRIG 76 09/20/2023 0933   HDL 128 09/20/2023 0933   CHOLHDL 1.7 09/20/2023 0933   CHOLHDL 2.0 03/25/2020 0216   VLDL 6  03/25/2020 0216   LDLCALC 74 09/20/2023 0933    Physical Exam:    VS:  BP (!) 106/90 (BP Location: Left Arm, Patient Position: Sitting, Cuff Size: Small)   Pulse 71   Ht 5\' 9"  (1.753 m)   Wt 98 lb 12.8 oz (44.8 kg)   SpO2 96%   BMI 14.59 kg/m     Wt Readings from Last 3 Encounters:  01/17/24 98 lb 12.8 oz (44.8 kg)  01/10/24 100 lb (45.4 kg)  01/06/24 99 lb (44.9 kg)     GEN: Cachectic, in no acute distress NECK: No JVD CARDIAC: RRR, no murmurs, rubs, gallops RESPIRATORY:  Clear to auscultation without rales, wheezing or rhonchi  ABDOMEN: Soft, non-tender, non-distended MUSCULOSKELETAL:  2+ LLE edema SKIN: Warm and dry NEUROLOGIC:  Alert and oriented x 3 PSYCHIATRIC:  Normal affect   ASSESSMENT:    1. Atrial flutter, unspecified type (HCC)   2. Essential hypertension   3. Heart failure with improved ejection fraction (HFimpEF) (HCC)   4. Tobacco use   5. Hyperlipidemia, unspecified hyperlipidemia type       PLAN:    Atrial Flutter: Noted during admission 01/2021, rates up to 140s.  CHADSVASc score 4 (CHF, HTN, PAD, female).  Echo 02/12/21 shows EF 40 to 45%.  Echocardiogram 02/22/2022 showed EF 55 to 60%, normal diastolic function, normal RV function, RVSP 41 mmHg, mild MR, mild to moderate TR.  Zio patch was worn for 9 days 02/2022 the only had 1 day of analysis time, no significant arrhythmias were seen. -Given she was going in and out of atrial flutter during admission, was started on amiodarone  to maintain sinus rhythm.  Appears to be maintaining sinus rhythm on amiodarone  200 mg daily.  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  Referred to EP, did not recommend ablation.  Have not had evidence of recurrent Afib and with abnormal thyroid labs, have discontinued amiodarone . -Holding Eliquis  in setting of anemia -Continue Toprol -XL 25 mg daily    Heart failure with improved ejection fraction: Echo 02/12/2021 shows EF 40 to 45%.  Coronary CTA 04/14/2021 showed calcium  score 234 (93rd percentile), moderate nonobstructive CAD by CT FFR.  Echocardiogram 02/22/2022 showed EF 55 to 60%, normal diastolic function, normal RV function, RVSP 41 mmHg, mild MR, mild to moderate TR.  Echocardiogram 03/28/2023 showed EF 60 to 65%, normal RV function, moderately elevated  pulmonary pressures, moderate biatrial enlargement, no significant valvular disease. 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  Recommended holding off for now -Continue Toprol -XL 25 mg daily  Anemia: Hemoglobin 7.9 on 03/28/2023, down from 12.0 01/2022.  Iron  studies consistent with iron  deficiency anemia.  Referred to GI and hematology.  Started on IV iron .  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  No DVT on duplex 09/2023.  Has been using compression stockings, no edema on exam today.  HTN:  started lisinopril -HCTZ 20-25 mg daily but developed AKI and was discontinued.  Currently on amlodipine  10 mg daily, hydralazine  25 mg 2 times daily and Toprol -XL 25 mg daily.  Appears controlled   PAD s/p fem-pop bypass: with left external iliac stenting 8/21. Has been on ASA/plavix  since that time.  During admission in June 2022, aspirin /Plavix  were discontinued and switched to Eliquis  monotherapy given her new atrial flutter as above.  Continue atorvastatin .  Eliquis  currently on hold as above, restarted ASA 81 mg daily.  Continue statin  Hyperlipidemia: LDL 74 on 09/20/23.  Continue atorvastatin  20 mg daily.    Tobacco use: Counseled on risk of tobacco use and cessation strongly encouraged.    AKI: Creatinine up to 1.76 on 02/22/2022.  Likely due to lisinopril -HCTZ.  Medication discontinued, improvement in creatinine to 1.17  on 03/15/2022.  Most recent creatinine 1.60 on 09/2023   RTC in 6 months   Medication Adjustments/Labs and Tests Ordered: Current medicines are reviewed at length with the patient today.  Concerns regarding medicines are outlined above.  Orders Placed This Encounter  Procedures   EKG 12-Lead     No orders of the defined types were placed in this encounter.     Patient Instructions  Medication Instructions:  Continue current medications *If you need a refill on your cardiac medications before your next appointment, please call your pharmacy*  Lab Work: none If you have labs (blood work) drawn today and your tests are completely normal, you will receive your results only by: MyChart Message (if you have MyChart) OR A paper copy in the mail If you have any lab test that is abnormal or we need to change your treatment, we will call you to review the results.  Testing/Procedures: none  Follow-Up: At [facility], you and your health needs are our priority.  As part of our continuing mission to provide you with exceptional heart care, our providers are all part of one team.  This team includes your primary Cardiologist (physician) and Advanced Practice Providers or APPs (Physician Assistants and Nurse Practitioners) who all work together to provide you with the care you need, when you need it.  Your next appointment:   6 month(s)  Provider:   Wendie Hamburg, MD    We recommend signing up for the patient portal called "MyChart".  Sign up information is provided on this After Visit Summary.  MyChart is used to connect with patients for Virtual Visits (Telemedicine).  Patients are able to view lab/test results, encounter notes, upcoming appointments, etc.  Non-urgent messages can be sent to your provider as well.   To learn more about what you can do with MyChart, go to [email or web address].   Other Instructions none         Signed, Wendie Hamburg,  MD  01/17/2024 6:42 PM    [facility]

## 2024-01-18 DIAGNOSIS — R918 Other nonspecific abnormal finding of lung field: Secondary | ICD-10-CM | POA: Diagnosis not present

## 2024-01-18 DIAGNOSIS — Z923 Personal history of irradiation: Secondary | ICD-10-CM | POA: Diagnosis not present

## 2024-01-18 DIAGNOSIS — C154 Malignant neoplasm of middle third of esophagus: Secondary | ICD-10-CM | POA: Diagnosis not present

## 2024-01-18 DIAGNOSIS — Z8522 Personal history of malignant neoplasm of nasal cavities, middle ear, and accessory sinuses: Secondary | ICD-10-CM | POA: Diagnosis not present

## 2024-01-18 DIAGNOSIS — J984 Other disorders of lung: Secondary | ICD-10-CM | POA: Diagnosis not present

## 2024-01-18 DIAGNOSIS — C300 Malignant neoplasm of nasal cavity: Secondary | ICD-10-CM | POA: Diagnosis not present

## 2024-01-18 DIAGNOSIS — Z8589 Personal history of malignant neoplasm of other organs and systems: Secondary | ICD-10-CM | POA: Diagnosis not present

## 2024-01-18 DIAGNOSIS — C760 Malignant neoplasm of head, face and neck: Secondary | ICD-10-CM | POA: Diagnosis not present

## 2024-01-24 ENCOUNTER — Telehealth (INDEPENDENT_AMBULATORY_CARE_PROVIDER_SITE_OTHER): Payer: Self-pay | Admitting: Primary Care

## 2024-01-24 NOTE — Telephone Encounter (Signed)
 Called pt to confirm appt. Pt's phone is unavailable.

## 2024-01-25 ENCOUNTER — Ambulatory Visit (INDEPENDENT_AMBULATORY_CARE_PROVIDER_SITE_OTHER): Admitting: Primary Care

## 2024-01-25 ENCOUNTER — Encounter (INDEPENDENT_AMBULATORY_CARE_PROVIDER_SITE_OTHER): Payer: Self-pay | Admitting: Primary Care

## 2024-01-25 VITALS — BP 158/87 | HR 72 | Resp 16 | Wt 101.0 lb

## 2024-01-25 DIAGNOSIS — I10 Essential (primary) hypertension: Secondary | ICD-10-CM | POA: Diagnosis not present

## 2024-01-25 DIAGNOSIS — Z720 Tobacco use: Secondary | ICD-10-CM | POA: Diagnosis not present

## 2024-01-25 DIAGNOSIS — I5022 Chronic systolic (congestive) heart failure: Secondary | ICD-10-CM

## 2024-01-25 DIAGNOSIS — I5043 Acute on chronic combined systolic (congestive) and diastolic (congestive) heart failure: Secondary | ICD-10-CM | POA: Insufficient documentation

## 2024-01-26 DIAGNOSIS — D125 Benign neoplasm of sigmoid colon: Secondary | ICD-10-CM | POA: Diagnosis not present

## 2024-01-26 DIAGNOSIS — K2960 Other gastritis without bleeding: Secondary | ICD-10-CM | POA: Diagnosis not present

## 2024-01-26 DIAGNOSIS — K3189 Other diseases of stomach and duodenum: Secondary | ICD-10-CM | POA: Diagnosis not present

## 2024-01-26 DIAGNOSIS — D123 Benign neoplasm of transverse colon: Secondary | ICD-10-CM | POA: Diagnosis not present

## 2024-01-26 DIAGNOSIS — C159 Malignant neoplasm of esophagus, unspecified: Secondary | ICD-10-CM | POA: Diagnosis not present

## 2024-02-01 DIAGNOSIS — Z85828 Personal history of other malignant neoplasm of skin: Secondary | ICD-10-CM | POA: Diagnosis not present

## 2024-02-01 DIAGNOSIS — Z923 Personal history of irradiation: Secondary | ICD-10-CM | POA: Diagnosis not present

## 2024-02-01 DIAGNOSIS — I899 Noninfective disorder of lymphatic vessels and lymph nodes, unspecified: Secondary | ICD-10-CM | POA: Diagnosis not present

## 2024-02-01 DIAGNOSIS — C154 Malignant neoplasm of middle third of esophagus: Secondary | ICD-10-CM | POA: Diagnosis not present

## 2024-02-02 DIAGNOSIS — C154 Malignant neoplasm of middle third of esophagus: Secondary | ICD-10-CM | POA: Diagnosis not present

## 2024-02-02 DIAGNOSIS — C300 Malignant neoplasm of nasal cavity: Secondary | ICD-10-CM | POA: Diagnosis not present

## 2024-02-07 DIAGNOSIS — Z79899 Other long term (current) drug therapy: Secondary | ICD-10-CM | POA: Diagnosis not present

## 2024-02-07 DIAGNOSIS — Z13810 Encounter for screening for upper gastrointestinal disorder: Secondary | ICD-10-CM | POA: Diagnosis not present

## 2024-02-07 DIAGNOSIS — R9389 Abnormal findings on diagnostic imaging of other specified body structures: Secondary | ICD-10-CM | POA: Diagnosis not present

## 2024-02-07 DIAGNOSIS — I10 Essential (primary) hypertension: Secondary | ICD-10-CM | POA: Diagnosis not present

## 2024-02-07 DIAGNOSIS — F17210 Nicotine dependence, cigarettes, uncomplicated: Secondary | ICD-10-CM | POA: Diagnosis not present

## 2024-02-07 DIAGNOSIS — K2210 Ulcer of esophagus without bleeding: Secondary | ICD-10-CM | POA: Diagnosis not present

## 2024-02-07 DIAGNOSIS — C154 Malignant neoplasm of middle third of esophagus: Secondary | ICD-10-CM | POA: Diagnosis not present

## 2024-02-07 DIAGNOSIS — Z9221 Personal history of antineoplastic chemotherapy: Secondary | ICD-10-CM | POA: Diagnosis not present

## 2024-02-07 NOTE — H&P (Signed)
 Gastroenterology Preprocedural History and Physical     Chief Complaint/Reason for Procedure: Veronica Stewart is a 68 y.o. female scheduled for a EUS, for the following indication Abnormal Imaging using deep sedation with propofol  or general anesthesia as per anesthesia provider .  A History and Physical has been performed and patient medication allergies have been reviewed. The patient's tolerance of previous anesthesia has been reviewed. The risks and benefits of the procedure and the sedation options and risks were discussed with the patient. All questions were answered and informed consent obtained.  HPI  Medical History[1]  Surgical History[2]  Family History[3]  Social History   Socioeconomic History  . Marital status: Married    Spouse name: Not on file  . Number of children: Not on file  . Years of education: Not on file  . Highest education level: Not on file  Occupational History  . Not on file  Tobacco Use  . Smoking status: Every Day    Current packs/day: 0.00    Types: Cigarettes    Last attempt to quit: 05/27/2021    Years since quitting: 2.7  . Smokeless tobacco: Never  . Tobacco comments:    Patient quit  smoking in 2022 but started smoking again after surgery in 2022  Substance and Sexual Activity  . Alcohol use: Never  . Drug use: Never  . Sexual activity: Not on file  Other Topics Concern  . 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 Within the past 12 months, you worried that your food would run out before you got money to buy more: Never true   . Within the past 12 months, the food you bought just didn't last and you didn't have money to get more: Never true  Transportation Needs: No Transportation Needs (10/13/2023)    Transportation   . In the past 12 months, has lack of reliable transportation kept you from medical appointments, meetings, work or from getting things needed for daily living? : No  Safety: Low Risk  (10/13/2023)   Safety   . How often does anyone, including family and friends, physically hurt you?: Never   . How often does anyone, including family and friends, insult or talk down to you?: Never   . How often does anyone, including family and friends, threaten you with harm?: Never   . How often does anyone, including family and friends, scream or curse at you?: Never  Living Situation: Low Risk  (10/13/2023)   Living Situation   . What is your living situation today?: I have a steady place to live   . Think about the place you live. Do you have problems with any of the following? Choose all that apply:: Not on file    Current Rx ordered in [facility][4]  Allergies[5]    Physical Exam:  Vitals:   02/07/24 1230 02/07/24 1240  BP:  (!) 174/100  BP Location:  Right arm  Pulse:  71  Resp:  16  Temp:  97.1 F (36.2 C)  TempSrc:  Temporal  SpO2:  99%  Weight: 45.4 kg (100 lb)   Height: 1.753 m (5' 9)    Body mass index  is 14.77 kg/m.  Airway:  MALLAMPATI TWO   Heart:  normal S1 and S2 Lungs:  clear Abdomen:  soft, nontender, normal bowel sounds Mental Status:  awake and alert; oriented to person, place, and time     ASA Grade Assessment: ASA 3 - Patient with moderate systemic disease with functional limitations   I have reviewed patient's health history and patient is cleared to proceed with the proposed procedure at this facility.   Selinda JONETTA Southgate, MD       [1] Past Medical History: Diagnosis Date  . Arrhythmia    Cardiology following  . Asthma (CMD)   . Atrial flutter    (CMD)   . Cancer    (CMD)   . History of chemotherapy   . Hypertension   . PAD (peripheral artery disease)    s/p left fem-pop bypass graft and iliac stent  [2] Past Surgical  History: Procedure Laterality Date  . COLONOSCOPY     Procedure: COLONOSCOPY  . ESOPHAGOGASTRODUODENOSCOPY  06/02/2021   Procedure: EGD;  Surgeon: Gladis Alm Serge, MD;  Location: [facility] MAIN OR;  Service: Trauma;;  . EXCISION CRANIOFACIAL TUMOR Right 05/29/2021   Procedure: CRANIOFACIAL EXCISION OF TUMOR;  Surgeon: Francyne Myles Soles, MD;  Location: [facility] MAIN OR;  Service: ENT;  Laterality: Right;  . FEMORAL ARTERY - POPLITEAL ARTERY BYPASS GRAFT Left 2021   Procedure: FEMORAL ARTERY - POPLITEAL ARTERY BYPASS GRAFT  . FEMORAL ENDARTERECTOMY Left    Procedure: FEMORAL ENDARTERECTOMY  . FREE FLAP GRAFT Left 05/29/2021   Procedure: FREE FLAP SCAPULAR;  Surgeon: Francyne Myles Soles, MD;  Location: [facility] MAIN OR;  Service: ENT;  Laterality: Left;  SABRA GASTROSTOMY N/A 06/02/2021   Procedure: GASTROSTOMY TUBE PLACEMENT OPEN;  Surgeon: Gladis Alm Serge, MD;  Location: [facility] MAIN OR;  Service: Trauma;  Laterality: N/A;  . HERNIA REPAIR     Procedure: HERNIA REPAIR  . ILIAC ARTERY STENT Left 2021   Procedure: ILIAC ARTERY STENT  . MAXILLECTOMY Right 05/29/2021   Procedure: MAXILLECTOMY;  Surgeon: Francyne Myles Soles, MD;  Location: [facility] MAIN OR;  Service: ENT;  Laterality: Right;  . NECK DISSECTION Bilateral 05/29/2021   Procedure: (OPD CRM) SELECTIVE NECK DISSECTION;  Surgeon: Francyne Myles Soles, MD;  Location: [facility] MAIN OR;  Service: ENT;  Laterality: Bilateral;  . OTHER SURGICAL HISTORY  2005   Procedure: OTHER SURGICAL HISTORY (ulcer surgery); d/t rupture  . PELVIC FRACTURE SURGERY Left    Procedure: PELVIC FRACTURE SURGERY  . TOOTH EXTRACTION N/A 05/29/2021   Procedure: DENTAL EXTRACTIONS x 7;  Surgeon: Rudell Theoplis Cory, DMD;  Location: [facility] MAIN OR;  Service: Dentistry;  Laterality: N/A;  . TRACHEOSTOMY N/A 05/29/2021   Procedure: TRACHEOTOMY;  Surgeon: Francyne Myles Soles, MD;  Location: [facility] MAIN OR;  Service: ENT;  Laterality: N/A;  [3] Family History Problem Relation Name Age of Onset  . Heart  failure Mother    . Hypertension Mother    . Cancer Father    . Brain cancer Brother    . Bone cancer Maternal Aunt    . Breast cancer Maternal Grandmother    . Cervical cancer Niece    [4] Meds Ordered in [facility]  Medication Sig Dispense Refill  . acetaminophen  (TYLENOL ) 500 mg tablet Take 1,000 mg by mouth every 6 (six) hours as needed.    . amiodarone  (PACERONE ) 200 mg tablet Take 200 mg by mouth daily.    . amLODIPine  (NORVASC ) 5 mg tablet     .  aspirin  325 mg tablet Take 325 mg by mouth Once Daily for 29 days. 29 tablet 0  . hydrALAZINE  (APRESOLINE ) 25 mg tablet     . lisinopriL  (PRINIVIL ) 5 mg tablet Take 5 mg by mouth daily.    . loratadine  (CLARITIN ) 10 mg tablet Take 10 mg by mouth daily.    . albuterol  HFA (PROVENTIL  HFA;VENTOLIN  HFA;PROAIR  HFA) 90 mcg/actuation inhaler Inhale 2 puffs as needed for wheezing.    . Ayr Saline gel nasal gel     . Deep Sea Nasal 0.65 % nasal spray Administer 1 spray into affected nostril(s) as needed.    . Eliquis  5 mg tab     . Eucerin Intensive Repair lotn Apply 1 Application topically as needed.    . fluticasone  propionate (FLONASE ) 50 mcg/spray nasal spray 2 sprays Once Daily.    . Integra Plus  125 mg iron - 1 mg cap Take 1 capsule by mouth every morning.    . metoprolol  succinate (TOPROL  XL) 25 mg 24 hr tablet     . mucositis mouthwash-viscous lidocaine  2% 1:1 30 mL 4 (four) times a day as needed. 480 mL 11  . ofloxacin (FLOXIN) 0.3 % otic solution Administer 5 drops into affected ear(s).    . ondansetron  (ZOFRAN ) 8 mg tablet Take 1 tablet (8 mg total) by mouth every 8 (eight) hours as needed for nausea. 60 tablet 2  . pantoprazole  (PROTONIX ) 40 mg grps delayed release granules 40 mg.    . prochlorperazine (Compazine) 10 mg tablet Take 1 tablet (10 mg total) by mouth every 6 (six) hours as needed for nausea or vomiting. 60 tablet 3  . sodium chloride  1 g TbSO tablet Take 1 g by mouth 2 (two) times a day.    . white petrolatum jelly       Current Facility-Administered Medications Ordered in Epic  Medication Dose Route Frequency Provider Last Rate Last Admin  . lactated ringer 's infusion  30 mL/hr intravenous Continuous Selinda JONETTA Southgate, MD      [5] No Known Allergies

## 2024-02-08 NOTE — Progress Notes (Unsigned)
 [facility]   Veronica Stewart is a 68 y.o. female presents for hypertension evaluation, Denies shortness of breath, headaches, chest pain or lower extremity edema, sudden onset, vision changes, unilateral weakness, dizziness, paresthesias   Patient reports adherence with medications.    Past Medical History:  Diagnosis Date  . Asthma   . Atrial fibrillation (HCC)   . Dermatophytosis of nail   . Hypertension   . Oropharyngeal dysphagia   . Squamous cell carcinoma of nasal cavity [facility])    Past Surgical History:  Procedure Laterality Date  . ABDOMINAL AORTOGRAM W/LOWER EXTREMITY Bilateral 03/05/2020   Procedure: ABDOMINAL AORTOGRAM W/LOWER EXTREMITY;  Surgeon: Gretta Lonni PARAS, MD;  Location: [facility] INVASIVE CV LAB;  Service: Cardiovascular;  Laterality: Bilateral;  . COLONOSCOPY WITH PROPOFOL  N/A 12/08/2023   Procedure: COLONOSCOPY WITH PROPOFOL ;  Surgeon: Federico Rosario BROCKS, MD;  Location: [facility] ENDOSCOPY;  Service: Gastroenterology;  Laterality: N/A;  . ENDARTERECTOMY FEMORAL Left 03/24/2020   Procedure: LEFT COMMON FEMORAL ENDARTERECTOMY;  Surgeon: Gretta Lonni PARAS, MD;  Location: [facility] OR;  Service: Vascular;  Laterality: Left;  . ENDOVEIN HARVEST OF GREATER SAPHENOUS VEIN Left 03/24/2020   Procedure: HARVEST OF GREATER SAPHENOUS VEIN;  Surgeon: Gretta Lonni PARAS, MD;  Location: [facility] OR;  Service: Vascular;  Laterality: Left;  . ESOPHAGOGASTRODUODENOSCOPY (EGD) WITH PROPOFOL  N/A 12/08/2023   Procedure: ESOPHAGOGASTRODUODENOSCOPY (EGD) WITH PROPOFOL ;  Surgeon: Federico Rosario BROCKS, MD;  Location: [facility] ENDOSCOPY;  Service: Gastroenterology;  Laterality: N/A;  . FEMORAL-POPLITEAL BYPASS GRAFT Left 03/24/2020   Procedure: BYPASS GRAFT FEMORAL-POPLITEAL ARTERY;  Surgeon: Gretta Lonni PARAS, MD;  Location: [facility] OR;  Service: Vascular;  Laterality: Left;  . INSERTION OF ILIAC STENT Left 03/24/2020   Procedure: INSERTION OF RETROGRADE ILIAC STENT;  Surgeon: Gretta Lonni PARAS, MD;  Location:  [facility] OR;  Service: Vascular;  Laterality: Left;  . INTRAMEDULLARY (IM) NAIL INTERTROCHANTERIC Left 03/18/2018   Procedure: INTRAMEDULLARY (IM) NAIL INTERTROCHANTRIC;  Surgeon: Kendal Franky SQUIBB, MD;  Location: [facility] OR;  Service: Orthopedics;  Laterality: Left;  . INTRAOPERATIVE ARTERIOGRAM Left 03/24/2020   Procedure: INTRA OPERATIVE ARTERIOGRAM;  Surgeon: Gretta Lonni PARAS, MD;  Location: [facility] OR;  Service: Vascular;  Laterality: Left;   No Known Allergies Current Outpatient Medications on File Prior to Visit  Medication Sig Dispense Refill  . acetaminophen  (TYLENOL ) 500 MG tablet Take 500 mg by mouth every 6 (six) hours as needed for mild pain.    . acetaminophen  (TYLENOL ) 650 MG CR tablet Take 650 mg by mouth every 8 (eight) hours as needed for pain. (Patient not taking: Reported on 01/17/2024)    . albuterol  (VENTOLIN  HFA) 108 (90 Base) MCG/ACT inhaler INHALE 2 PUFFS BY MOUTH EVERY 6 HOURS AS NEEDED FOR WHEEZING AND SHORTNESS OF BREATH (Patient not taking: Reported on 01/17/2024) 18 g 2  . amLODipine  (NORVASC ) 10 MG tablet Take 1 tablet (10 mg total) by mouth daily. 90 tablet 3  . aspirin  EC 81 MG tablet Take 81 mg by mouth daily. Swallow whole.    SABRA DEEP SEA NASAL SPRAY 0.65 % nasal spray SMARTSIG:Both Nares    . Emollient (EUCERIN INTENSIVE REPAIR) LOTN Apply 1 Application topically daily at 6 (six) AM.    . FeFum-FePoly-FA-B Cmp-C-Biot (INTEGRA PLUS ) CAPS Take 1 capsule by mouth every morning. 30 capsule 2  . fluticasone  (FLONASE ) 50 MCG/ACT nasal spray SHAKE LIQUID AND USE 2 SPRAYS IN EACH NOSTRIL DAILY 48 g 0  . Fluticasone -Salmeterol (ADVAIR) 100-50 MCG/DOSE AEPB Inhale 1 puff into the lungs 2 (two)  times daily. 1 each 3  . hydrALAZINE  (APRESOLINE ) 25 MG tablet TAKE 1 TABLET BY MOUTH TWICE  DAILY 200 tablet 2  . loratadine  (CLARITIN ) 10 MG tablet Take 1 tablet (10 mg total) by mouth daily. 30 tablet 11  . metoprolol  succinate (TOPROL -XL) 25 MG 24 hr tablet TAKE 1 TABLET BY MOUTH DAILY  WITH A MEAL  OR IMMEDIATELY AFTER THE SAME MEAL 100 tablet 3  . ofloxacin (FLOXIN) 0.3 % OTIC solution Place 5 drops into both ears as needed. (Patient not taking: Reported on 01/17/2024)    . ondansetron  (ZOFRAN ) 8 MG tablet Take 8 mg by mouth 3 (three) times daily. (Patient not taking: Reported on 01/17/2024)    . pantoprazole  (PROTONIX ) 40 MG tablet TAKE 1 TABLET(40 MG) BY MOUTH TWICE DAILY 180 tablet 3   No current facility-administered medications on file prior to visit.   Social History   Socioeconomic History  . Marital status: Legally Separated    Spouse name: Not on file  . Number of children: Not on file  . Years of education: Not on file  . Highest education level: Not on file  Occupational History  . Not on file  Tobacco Use  . Smoking status: Every Day    Current packs/day: 0.50    Types: Cigarettes  . Smokeless tobacco: Never  . Tobacco comments:    01/17/2024 Patient smokes about 6-7 cigarettes daily  Vaping Use  . Vaping status: Never Used  Substance and Sexual Activity  . Alcohol use: Yes    Comment: two 40 oz beers most days   . Drug use: Not Currently    Types: Marijuana  . Sexual activity: Not on file  Other Topics Concern  . Not on file  Social History Narrative  . Not on file   Social Drivers of Health   Financial Resource Strain: Not on file  Food Insecurity: Low Risk  (10/13/2023)   Received from [facility]   Hunger Vital Sign   . Within the past 12 months, you worried that your food would run out before you got money to buy more: Never true   . Within the past 12 months, the food you bought just didn't last and you didn't have money to get more. : Never true  Transportation Needs: No Transportation Needs (10/13/2023)   Received from [facility]   . In the past 12 months, has lack of reliable transportation kept you from medical appointments, meetings, work or from getting things needed for daily living? : No  Physical Activity: Not on file   Stress: Not on file  Social Connections: Not on file  Intimate Partner Violence: Not on file   Family History  Problem Relation Age of Onset  . Breast cancer Paternal Aunt   . Sudden Cardiac Death Neg Hx    Health Maintenance  Topic Date Due  . Medicare Annual Wellness (AWV)  Never done  . COVID-19 Vaccine (1) Never done  . Hepatitis C Screening  Never done  . Pneumococcal Vaccine: 50+ Years (1 of 2 - PCV) Never done  . Zoster Vaccines- Shingrix (1 of 2) Never done  . MAMMOGRAM  Never done  . Lung Cancer Screening  12/13/2023  . INFLUENZA VACCINE  03/16/2024  . DTaP/Tdap/Td (2 - Td or Tdap) 03/25/2029  . Colonoscopy  12/07/2033  . DEXA SCAN  Completed  . Hepatitis B Vaccines  Aged Out  . HPV VACCINES  Aged Out  . Meningococcal B  Vaccine  Aged Out     OBJECTIVE:  Vitals:   01/25/24 1109  BP: (!) 158/87  Pulse: 72  Resp: 16  SpO2: 98%  Weight: 101 lb (45.8 kg)    Physical Exam Vitals reviewed.  Constitutional:      Appearance: Normal appearance.  HENT:     Head: Normocephalic.     Right Ear: Tympanic membrane, ear canal and external ear normal.     Left Ear: Tympanic membrane, ear canal and external ear normal.     Nose: Nose normal.     Mouth/Throat:     Mouth: Mucous membranes are moist.   Eyes:     Extraocular Movements: Extraocular movements intact.     Pupils: Pupils are equal, round, and reactive to light.    Cardiovascular:     Rate and Rhythm: Normal rate.  Pulmonary:     Effort: Pulmonary effort is normal.     Breath sounds: Normal breath sounds.  Abdominal:     General: Bowel sounds are normal.     Palpations: Abdomen is soft.   Musculoskeletal:        General: Normal range of motion.     Cervical back: Normal range of motion.   Skin:    General: Skin is warm and dry.   Neurological:     Mental Status: She is alert and oriented to person, place, and time.   Psychiatric:        Mood and Affect: Mood normal.        Behavior:  Behavior normal.        Thought Content: Thought content normal.     ROS Comprehensive ROS Pertinent positive and negative noted in HPI   Last 3 Office BP readings: BP Readings from Last 3 Encounters:  01/25/24 (!) 158/87  01/17/24 (!) 106/90  01/10/24 (!) 152/87   BMET    Component Value Date/Time   NA 138 10/12/2023 1522   K 5.0 10/12/2023 1522   CL 99 10/12/2023 1522   CO2 24 10/12/2023 1522   GLUCOSE 70 10/12/2023 1522   GLUCOSE 79 06/27/2023 1524   BUN 15 10/12/2023 1522   CREATININE 1.60 (H) 10/12/2023 1522   CREATININE 1.41 (H) 04/13/2023 1139   CALCIUM  9.5 10/12/2023 1522   GFRNONAA 41 (L) 04/13/2023 1139   GFRAA >60 03/25/2020 0216    Renal function: CrCl cannot be calculated (Patient's most recent lab result is older than the maximum 21 days allowed.).  Clinical ASCVD: No  The ASCVD Risk score (Arnett DK, et al., 2019) failed to calculate for the following reasons:   The valid HDL cholesterol range is 20 to 100 mg/dL  ASCVD risk factors include- ITALY   ASSESSMENT & PLAN:    -Counseled on lifestyle modifications for blood pressure control including reduced dietary sodium, increased exercise, weight reduction and adequate sleep. Also, educated patient about the risk for cardiovascular events, stroke and heart attack. Also counseled patient about the importance of medication adherence. If you participate in smoking, it is important to stop using tobacco as this will increase the risks associated with uncontrolled blood pressure.   -Hypertension longstanding/newly diagnosed currently *** on current medications. Patient {Is/is not:9024} adherent with current medications.   Goal BP:  For patients younger than 60: Goal BP < 130/80. For patients 60 and older: Goal BP < 140/90. For patients with diabetes: Goal BP < 130/80. Your most recent BP: ***  Minimize salt intake. Minimize alcohol intake    This note  has been created with Furniture conservator/restorer. Any transcriptional errors are unintentional.   Rosaline SHAUNNA Bohr, NP 02/08/2024, 6:28 PM

## 2024-02-10 DIAGNOSIS — C154 Malignant neoplasm of middle third of esophagus: Secondary | ICD-10-CM | POA: Diagnosis not present

## 2024-02-15 DIAGNOSIS — C154 Malignant neoplasm of middle third of esophagus: Secondary | ICD-10-CM | POA: Diagnosis not present

## 2024-03-01 DIAGNOSIS — Z5111 Encounter for antineoplastic chemotherapy: Secondary | ICD-10-CM | POA: Diagnosis not present

## 2024-03-01 DIAGNOSIS — C154 Malignant neoplasm of middle third of esophagus: Secondary | ICD-10-CM | POA: Diagnosis not present

## 2024-03-01 DIAGNOSIS — C300 Malignant neoplasm of nasal cavity: Secondary | ICD-10-CM | POA: Diagnosis not present

## 2024-03-07 DIAGNOSIS — C154 Malignant neoplasm of middle third of esophagus: Secondary | ICD-10-CM | POA: Diagnosis not present

## 2024-03-13 ENCOUNTER — Other Ambulatory Visit (INDEPENDENT_AMBULATORY_CARE_PROVIDER_SITE_OTHER): Payer: Self-pay | Admitting: Primary Care

## 2024-03-13 DIAGNOSIS — J302 Other seasonal allergic rhinitis: Secondary | ICD-10-CM

## 2024-03-13 NOTE — Telephone Encounter (Unsigned)
 Copied from CRM [id number]. Topic: Clinical - Medication Refill >> Mar 13, 2024 10:48 AM Emylou G wrote: Medication: loratadine  (CLARITIN ) 10 MG tablet  Has the patient contacted their pharmacy? Yes (Agent: If no, request that the patient contact the pharmacy for the refill. If patient does not wish to contact the pharmacy document the reason why and proceed with request.) (Agent: If yes, when and what did the pharmacy advise?) said will fax  This is the patient's preferred pharmacy:  [facility] STORE #[id number] [place] - [address] AT [facility] [address] [place] [address] Phone: [phone] Fax: [phone]   Is this the correct pharmacy for this prescription? Yes If no, delete pharmacy and type the correct one.   Has the prescription been filled recently? Yes  Is the patient out of the medication? Yes  Has the patient been seen for an appointment in the last year OR does the patient have an upcoming appointment? Yes  Can we respond through MyChart? No  Agent: Please be advised that Rx refills may take up to 3 business days. We ask that you follow-up with your pharmacy.

## 2024-03-14 NOTE — Telephone Encounter (Signed)
 Too soon for refill, refilled 10/25/23 for 30 and 11 RF.  Requested Prescriptions  Pending Prescriptions Disp Refills   loratadine  (CLARITIN ) 10 MG tablet 30 tablet 11    Sig: Take 1 tablet (10 mg total) by mouth daily.     Ear, Nose, and Throat:  Antihistamines 2 Failed - 03/14/2024 12:25 PM      Failed - Cr in normal range and within 360 days    Creatinine  Date Value Ref Range Status  04/13/2023 1.41 (H) 0.44 - 1.00 mg/dL Final   Creatinine, Ser  Date Value Ref Range Status  10/12/2023 1.60 (H) 0.57 - 1.00 mg/dL Final         Passed - Valid encounter within last 12 months    Recent Outpatient Visits           1 month ago Essential hypertension   [place] [facility] Celestia Rosaline SQUIBB, NP   4 months ago Encounter for screening mammogram for malignant neoplasm of breast   [place] [facility] Celestia Rosaline SQUIBB, NP   10 months ago Essential hypertension   [place] [facility] Celestia Rosaline SQUIBB, NP   1 year ago Need for immunization against influenza   [place] [facility] Celestia Rosaline SQUIBB, NP   2 years ago Essential hypertension   [place] [facility] Celestia Rosaline SQUIBB, NP

## 2024-03-15 DIAGNOSIS — C154 Malignant neoplasm of middle third of esophagus: Secondary | ICD-10-CM | POA: Diagnosis not present

## 2024-03-15 DIAGNOSIS — Z5111 Encounter for antineoplastic chemotherapy: Secondary | ICD-10-CM | POA: Diagnosis not present

## 2024-03-15 DIAGNOSIS — C300 Malignant neoplasm of nasal cavity: Secondary | ICD-10-CM | POA: Diagnosis not present

## 2024-03-15 NOTE — Progress Notes (Signed)
 Outpatient Oncology Nutrition Supportive Oncology [facility]   Outpatient Oncology Nutrition Note   PATIENT NAME:  Veronica Stewart DATE:  03/16/2024  TIME:  9:32 AM  Note Type: Follow-up Referral Reason:  Nutrition consult Encounter Type: In-person  Patient Overview: Veronica Stewart is a 68 y.o. with esophageal cancer undergoing treatment with FOLFOX. 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 RD attempted to meet pt during infusion appointment today; however pt asleep. Able to chat with pt by phone once pt home. Interventions:    Nutrition Intervention: Nutrition-related medication management, Nutrition counseling, Modify composition of meals/snacks  Intervention Details:  Continue current meals with focus on high calorie/high protein foods.   Continue Ensure Plus X 2-3 daily, or as needed. Recommend 64 oz fluids daily.  Enteral Nutrition:  Enteral: No         Nutrition Education:  No Handouts Provided (n/a): None   Learner: Patient Preferred Learning Methods: Discussion Method of Teaching: Verbal instructions Learner Response: Demonstrates acceptable knowledge of topic/instructions  Nutrition Intervention Barriers: None  Nutrition Assessment Summary:    Nutrition Assessment: Significant wt loss over past 2 weeks; however wt relatively stable > 1 month overall. Likely meeting at least 85% estimated nutrition needs based on reported intake. May increase intake of high calorie oral nutrition supplements to help promote wt gain. Question adequacy of hydration based on altered GFR.     03/01/2024- Pt underweight based on BMI; however no significant wt change noted recently. Current diet likely meets at least 85% estimated nutrition needs based on pt report, stable wt. High calorie oral nutrition supplements remain indicated based on wt status.    Assessment:   PAST MEDICAL HISTORY: Medical  History[1]   PAST SURGICAL HISTORY: Surgical History[2]   Home Medications:  Prior to Admission medications  Medication  acetaminophen  (TYLENOL ) 500 mg tablet  albuterol  HFA (PROVENTIL  HFA;VENTOLIN  HFA;PROAIR  HFA) 90 mcg/actuation inhaler  amiodarone  (PACERONE ) 200 mg tablet  amLODIPine  (NORVASC ) 5 mg tablet  aspirin  325 mg tablet  Ayr Saline gel nasal gel  Deep Sea Nasal 0.65 % nasal spray  Eliquis  5 mg tab  Eucerin Intensive Repair lotn  fluticasone  propionate (FLONASE ) 50 mcg/spray nasal spray  hydrALAZINE  (APRESOLINE ) 25 mg tablet  Integra Plus  125 mg iron - 1 mg cap  lisinopriL  (PRINIVIL ) 5 mg tablet  loratadine  (CLARITIN ) 10 mg tablet  metoprolol  succinate (TOPROL  XL) 25 mg 24 hr tablet  mucositis mouthwash-viscous lidocaine  2% 1:1  ofloxacin (FLOXIN) 0.3 % otic solution  ondansetron  (ZOFRAN ) 8 mg tablet  pantoprazole  (PROTONIX ) 40 mg grps delayed release granules  prochlorperazine (Compazine) 10 mg tablet  sodium chloride  1 g TbSO tablet  traMADoL  (ULTRAM ) 50 mg tablet  white petrolatum jelly    Nutrition Pertinent Herbal Medications/Supplements: None reported  Relevant Labs: Nutrition-related labs reviewed from 03/15/24  eGFR 50  Nutrition Focused Physical Exam: NFPE deferred; will attempt at future visit if able/applicable      Symptoms: None  Anthropometrics: Wt Readings from Last 1 Encounters:  03/15/24 45.5 kg (100 lb 6.4 oz)   Ht Readings from Last 1 Encounters:  02/10/24 1.753 m (5' 9)   BMI Readings from Last 1 Encounters:  03/15/24 14.83 kg/m   Weight Classification: Underweight, For adult > 71 years old BMI: 14.8 kg/m2  Weight History: Stated UBW: not reported IBW: 70.4-91.9 kg (based on recommended BMI 23-30 kg/m2 for >= 65 years)  Wt Readings from Last 10 Encounters:  03/15/24 45.5 kg (100 lb 6.4 oz)  03/01/24 46.6 kg (102 lb 11.2 oz)  02/10/24 45.6 kg (100 lb 9.6 oz)  02/07/24 45.4 kg (100 lb)  02/02/24 45.4 kg (100 lb)  01/18/24  45.3 kg (99 lb 12.8 oz)  12/29/23 45 kg (99 lb 4.8 oz)  10/13/23 47 kg (103 lb 9.6 oz)  07/18/23 43.2 kg (95 lb 4.8 oz)  06/28/23 41.8 kg (92 lb 1.6 oz)   2.4% wt loss in 2 weeks- significant  Stable wt > 1 month overall   Stated Nutrition Intake History:  Nutrition History: Regular diet   24 hour recall:  24-Hour Recall Breakfast: egg with piece of fish 24-Hour Recall Lunch: chicken salad 24-Hour Recall Dinner: dressing with gravy   24-Hour Recall Drinks: water, lemonade- not quantified 24-Hour Recall Additional Comments: Currently consuming Ensure Plus X 1 daily.  Pt is consuming at least 85% of estimated energy needs daily  Religious, cultural, ethnic or personal food preferences: none reported  Food Allergies: none reported   Estimated Needs:   Nutrition Needs Based On: Actual weight  Weight Used:  45.5 kg  Calories (kcal/day): 8634-8406 kcal  Calories based on: 30-35 kcal/kg  Protein (g/day): 68-91 g  Protein based on: 1.5-2g/kg  Fluid (mL/day): 1883 ml, Other (Holliday-Segar)  Nutrition Diagnosis:   Nutrition Diagnosis: Underweight Problem Related To: Chronic illness, Increased nutrient demand Problem as Evidenced By: BMI < 23 kg/m2 for >= 65 years.  Nutrition Diagnosis 2: Increased nutrient needs Problem Related To 2: Chronic illness Problem as Evidenced By: metabolic demands of cancer dx/treatment, difficulty gaining wt.         Goals, Monitoring & Evaluation:        Adequate PO - Progressing, continue Meet fluid needs - Progressing, continue Minimize nutrition impact symptoms - Goal met Promote weight gain - Not met  Follow-up Information:    RDN will follow during treatment per clinical judgement RDN contact information provided and patient encouraged to reach out with nutrition questions or concerns.              Alan CHRISTELLA Radish, RD  03/16/2024 9:32 AM         [1] Past Medical History: Diagnosis Date  . Arrhythmia     Cardiology following  . Asthma (CMD)   . Atrial flutter    (CMD)   . Cancer    (CMD)   . History of chemotherapy   . Hypertension   . PAD (peripheral artery disease)    s/p left fem-pop bypass graft and iliac stent  [2] Past Surgical History: Procedure Laterality Date  . COLONOSCOPY     Procedure: COLONOSCOPY  . ESOPHAGOGASTRODUODENOSCOPY  06/02/2021   Procedure: EGD;  Surgeon: Gladis Alm Serge, MD;  Location: [facility] MAIN OR;  Service: Trauma;;  . 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 FEMORAL ARTERY - POPLITEAL ARTERY BYPASS GRAFT Left 2021   Procedure: FEMORAL ARTERY - POPLITEAL ARTERY BYPASS GRAFT  . FEMORAL ENDARTERECTOMY Left    Procedure: FEMORAL ENDARTERECTOMY  . FREE FLAP GRAFT Left 05/29/2021   Procedure: FREE FLAP SCAPULAR;  Surgeon: Francyne Myles Soles, MD;  Location: [facility] MAIN OR;  Service: ENT;  Laterality: Left;  SABRA GASTROSTOMY N/A 06/02/2021   Procedure: GASTROSTOMY TUBE PLACEMENT OPEN;  Surgeon: Gladis Alm Serge, MD;  Location: [facility] MAIN OR;  Service: Trauma;  Laterality:  N/A;  . HERNIA REPAIR     Procedure: HERNIA REPAIR  . ILIAC ARTERY STENT Left 2021   Procedure: ILIAC ARTERY STENT  . MAXILLECTOMY Right 05/29/2021   Procedure: MAXILLECTOMY;  Surgeon: Francyne Myles Soles, MD;  Location: [facility] MAIN OR;  Service: ENT;  Laterality: Right;  . NECK DISSECTION Bilateral 05/29/2021   Procedure: (OPD CRM) SELECTIVE NECK DISSECTION;  Surgeon: Francyne Myles Soles, MD;  Location: [facility] MAIN OR;  Service: ENT;  Laterality: Bilateral;  . OTHER SURGICAL HISTORY  2005   Procedure: OTHER SURGICAL HISTORY (ulcer surgery); d/t rupture  . PELVIC FRACTURE SURGERY Left    Procedure: PELVIC FRACTURE SURGERY  . TOOTH EXTRACTION N/A 05/29/2021   Procedure: DENTAL EXTRACTIONS x 7;  Surgeon: Rudell Theoplis Cory, DMD;  Location: [facility] MAIN OR;  Service:  Dentistry;  Laterality: N/A;  . TRACHEOSTOMY N/A 05/29/2021   Procedure: TRACHEOTOMY;  Surgeon: Francyne Myles Soles, MD;  Location: [facility] MAIN OR;  Service: ENT;  Laterality: N/A;

## 2024-03-16 DIAGNOSIS — C154 Malignant neoplasm of middle third of esophagus: Secondary | ICD-10-CM | POA: Diagnosis not present

## 2024-03-19 NOTE — Telephone Encounter (Signed)
 Copied from CRM [id number]. Topic: Clinical - Prescription Issue >> Mar 16, 2024  3:13 PM Pinkey ORN wrote: Reason for CRM: loratadine  (CLARITIN ) 10 MG tablet

## 2024-03-19 NOTE — Telephone Encounter (Signed)
 Attempt to call pharmacy to f/u with refills.  Pharmacy is closed, opens at 10a.  She has refills of 11 sent on 10/25/2023.

## 2024-03-28 ENCOUNTER — Ambulatory Visit: Admitting: Podiatry

## 2024-03-28 ENCOUNTER — Encounter: Payer: Self-pay | Admitting: Podiatry

## 2024-03-28 DIAGNOSIS — Z7901 Long term (current) use of anticoagulants: Secondary | ICD-10-CM | POA: Diagnosis not present

## 2024-03-28 DIAGNOSIS — B351 Tinea unguium: Secondary | ICD-10-CM

## 2024-03-28 DIAGNOSIS — M79674 Pain in right toe(s): Secondary | ICD-10-CM | POA: Diagnosis not present

## 2024-03-28 DIAGNOSIS — M79675 Pain in left toe(s): Secondary | ICD-10-CM | POA: Diagnosis not present

## 2024-03-28 NOTE — Progress Notes (Signed)
 This patient returns to my office for at risk foot care.  This patient requires this care by a professional since this patient will be at risk due to having PAD and coagulation defect due to eliquis .  This patient is unable to cut nails herself since the patient cannot reach her nails.These nails are painful walking and wearing shoes.  She has painful callus both feet.. This patient presents for at risk foot care today.  General Appearance  Alert, conversant and in no acute stress.  Vascular  Dorsalis pedis and posterior tibial  pulses are palpable  bilaterally.  Capillary return is within normal limits  bilaterally. Temperature is within normal limits  bilaterally.  Neurologic  Senn-Weinstein monofilament wire test within normal limits  bilaterally. Muscle power within normal limits bilaterally.  Nails Thick disfigured discolored nails with subungual debris  from hallux to fifth toes left and second to fifth toes right foot.. No evidence of bacterial infection or drainage bilaterally.  Orthopedic  No limitations of motion  feet .  No crepitus or effusions noted.  No bony pathology or digital deformities noted. Plantar exostosis  base fifth  B/L asymptomatic.  Skin  normotropic skin noted bilaterally.  No signs of infections or ulcers noted.    Onychomycosis  Pain in right toes  Pain in left toes     Consent was obtained for treatment procedures.   Mechanical debridement of nails 1-5 left and 2-5 right foot.performed with a nail nipper.  Filed with dremel without incident.     Return office visit   3 months                   Told patient to return for periodic foot care and evaluation due to potential at risk complications.   Ruffin Cotton DPM

## 2024-04-05 DIAGNOSIS — Z79899 Other long term (current) drug therapy: Secondary | ICD-10-CM | POA: Diagnosis not present

## 2024-04-05 DIAGNOSIS — C154 Malignant neoplasm of middle third of esophagus: Secondary | ICD-10-CM | POA: Diagnosis not present

## 2024-04-05 DIAGNOSIS — Z5112 Encounter for antineoplastic immunotherapy: Secondary | ICD-10-CM | POA: Diagnosis not present

## 2024-04-05 DIAGNOSIS — C300 Malignant neoplasm of nasal cavity: Secondary | ICD-10-CM | POA: Diagnosis not present

## 2024-04-05 DIAGNOSIS — Z5111 Encounter for antineoplastic chemotherapy: Secondary | ICD-10-CM | POA: Diagnosis not present

## 2024-04-25 DIAGNOSIS — C154 Malignant neoplasm of middle third of esophagus: Secondary | ICD-10-CM | POA: Diagnosis not present

## 2024-04-25 DIAGNOSIS — Z5111 Encounter for antineoplastic chemotherapy: Secondary | ICD-10-CM | POA: Diagnosis not present

## 2024-04-25 DIAGNOSIS — Z79899 Other long term (current) drug therapy: Secondary | ICD-10-CM | POA: Diagnosis not present

## 2024-04-25 DIAGNOSIS — C300 Malignant neoplasm of nasal cavity: Secondary | ICD-10-CM | POA: Diagnosis not present

## 2024-04-25 DIAGNOSIS — Z5112 Encounter for antineoplastic immunotherapy: Secondary | ICD-10-CM | POA: Diagnosis not present

## 2024-05-09 DIAGNOSIS — C154 Malignant neoplasm of middle third of esophagus: Secondary | ICD-10-CM | POA: Diagnosis not present

## 2024-05-09 DIAGNOSIS — C300 Malignant neoplasm of nasal cavity: Secondary | ICD-10-CM | POA: Diagnosis not present

## 2024-05-09 DIAGNOSIS — Z5112 Encounter for antineoplastic immunotherapy: Secondary | ICD-10-CM | POA: Diagnosis not present

## 2024-05-09 DIAGNOSIS — Z79899 Other long term (current) drug therapy: Secondary | ICD-10-CM | POA: Diagnosis not present

## 2024-05-09 DIAGNOSIS — Z5111 Encounter for antineoplastic chemotherapy: Secondary | ICD-10-CM | POA: Diagnosis not present

## 2024-05-13 ENCOUNTER — Other Ambulatory Visit: Payer: Self-pay | Admitting: Cardiology

## 2024-05-30 NOTE — Progress Notes (Signed)
 Veronica Stewart                                          MRN: [id number]   05/30/2024   The [facility] Quality Team Specialist reviewed this patient medical record for the purposes of chart review for care gap closure. The following were reviewed: chart review for care gap closure-breast cancer screening.    [facility]BCI Quality Team

## 2024-06-01 DIAGNOSIS — C154 Malignant neoplasm of middle third of esophagus: Secondary | ICD-10-CM | POA: Diagnosis not present

## 2024-06-01 DIAGNOSIS — C300 Malignant neoplasm of nasal cavity: Secondary | ICD-10-CM | POA: Diagnosis not present

## 2024-06-06 DIAGNOSIS — C300 Malignant neoplasm of nasal cavity: Secondary | ICD-10-CM | POA: Diagnosis not present

## 2024-06-06 DIAGNOSIS — Z5111 Encounter for antineoplastic chemotherapy: Secondary | ICD-10-CM | POA: Diagnosis not present

## 2024-06-06 DIAGNOSIS — C154 Malignant neoplasm of middle third of esophagus: Secondary | ICD-10-CM | POA: Diagnosis not present

## 2024-06-18 ENCOUNTER — Other Ambulatory Visit (INDEPENDENT_AMBULATORY_CARE_PROVIDER_SITE_OTHER): Payer: Self-pay | Admitting: Primary Care

## 2024-06-18 DIAGNOSIS — J302 Other seasonal allergic rhinitis: Secondary | ICD-10-CM

## 2024-06-18 NOTE — Telephone Encounter (Signed)
 Will forward to provider

## 2024-06-21 ENCOUNTER — Telehealth (INDEPENDENT_AMBULATORY_CARE_PROVIDER_SITE_OTHER): Payer: Self-pay | Admitting: Primary Care

## 2024-06-21 NOTE — Telephone Encounter (Signed)
 Copied from CRM #[id number]. Topic: Clinical - Prescription Issue >> Jun 21, 2024 11:00 AM Olam RAMAN wrote: Reason for CRM: pt called stated we need to call [facility] for Loratadine  10 MG  issue CB [phone]

## 2024-06-28 ENCOUNTER — Ambulatory Visit: Admitting: Podiatry

## 2024-07-04 ENCOUNTER — Ambulatory Visit: Admitting: Podiatry

## 2024-07-08 ENCOUNTER — Other Ambulatory Visit: Payer: Self-pay | Admitting: Cardiology

## 2024-07-11 ENCOUNTER — Ambulatory Visit: Admitting: Podiatry

## 2024-07-19 ENCOUNTER — Ambulatory Visit: Admitting: Podiatry

## 2024-07-19 ENCOUNTER — Encounter: Payer: Self-pay | Admitting: Podiatry

## 2024-07-19 DIAGNOSIS — Z7901 Long term (current) use of anticoagulants: Secondary | ICD-10-CM | POA: Diagnosis not present

## 2024-07-19 DIAGNOSIS — B351 Tinea unguium: Secondary | ICD-10-CM | POA: Diagnosis not present

## 2024-07-19 DIAGNOSIS — M79674 Pain in right toe(s): Secondary | ICD-10-CM

## 2024-07-19 DIAGNOSIS — M79675 Pain in left toe(s): Secondary | ICD-10-CM | POA: Diagnosis not present

## 2024-07-19 DIAGNOSIS — Q828 Other specified congenital malformations of skin: Secondary | ICD-10-CM | POA: Diagnosis not present

## 2024-07-19 NOTE — Progress Notes (Signed)
 This patient returns to my office for at risk foot care.  This patient requires this care by a professional since this patient will be at risk due to having PAD and coagulation defect due to eliquis .  This patient is unable to cut nails herself since the patient cannot reach her nails.These nails are painful walking and wearing shoes.  She has painful callus both feet.. This patient presents for at risk foot care today.  General Appearance  Alert, conversant and in no acute stress.  Vascular  Dorsalis pedis and posterior tibial  pulses are palpable  bilaterally.  Capillary return is within normal limits  bilaterally. Temperature is within normal limits  bilaterally.  Neurologic  Senn-Weinstein monofilament wire test within normal limits  bilaterally. Muscle power within normal limits bilaterally.  Nails Thick disfigured discolored nails with subungual debris  from hallux to fifth toes left and second to fifth toes right foot.. No evidence of bacterial infection or drainage bilaterally.  Orthopedic  No limitations of motion  feet .  No crepitus or effusions noted.  No bony pathology or digital deformities noted. Plantar exostosis  base fifth  B/L symptomatic.  Skin  normotropic skin noted bilaterally.  No signs of infections or ulcers noted.    Onychomycosis  Pain in right toes  Pain in left toes     Consent was obtained for treatment procedures.   Mechanical debridement of nails 1-5 left and 1-5 right foot.performed with a nail nipper.  Filed with dremel without incident.  Debride callus  B/L.   Return office visit   3 months                   Told patient to return for periodic foot care and evaluation due to potential at risk complications.   Cordella Bold DPM

## 2024-07-20 ENCOUNTER — Telehealth: Payer: Self-pay | Admitting: Hematology

## 2024-07-20 NOTE — Telephone Encounter (Signed)
 Spoke to [employer] at [employer]- we clarified the location needed for the referral location. She stated a new referral should be coming to [facility] for Dr.Feng for a transfer of care closer to the patients home.

## 2024-07-25 ENCOUNTER — Encounter: Payer: Self-pay | Admitting: Hematology

## 2024-07-25 ENCOUNTER — Inpatient Hospital Stay: Admitting: Hematology

## 2024-07-25 ENCOUNTER — Inpatient Hospital Stay: Attending: Physician Assistant

## 2024-07-25 ENCOUNTER — Inpatient Hospital Stay

## 2024-07-25 VITALS — BP 149/110 | HR 92 | Temp 97.8°F | Ht 69.0 in | Wt 101.4 lb

## 2024-07-25 DIAGNOSIS — C155 Malignant neoplasm of lower third of esophagus: Secondary | ICD-10-CM | POA: Insufficient documentation

## 2024-07-25 DIAGNOSIS — C159 Malignant neoplasm of esophagus, unspecified: Secondary | ICD-10-CM | POA: Insufficient documentation

## 2024-07-25 DIAGNOSIS — G620 Drug-induced polyneuropathy: Secondary | ICD-10-CM | POA: Insufficient documentation

## 2024-07-25 DIAGNOSIS — Z803 Family history of malignant neoplasm of breast: Secondary | ICD-10-CM | POA: Insufficient documentation

## 2024-07-25 DIAGNOSIS — Z79899 Other long term (current) drug therapy: Secondary | ICD-10-CM | POA: Insufficient documentation

## 2024-07-25 DIAGNOSIS — C300 Malignant neoplasm of nasal cavity: Secondary | ICD-10-CM | POA: Diagnosis not present

## 2024-07-25 DIAGNOSIS — C778 Secondary and unspecified malignant neoplasm of lymph nodes of multiple regions: Secondary | ICD-10-CM | POA: Insufficient documentation

## 2024-07-25 DIAGNOSIS — T451X5A Adverse effect of antineoplastic and immunosuppressive drugs, initial encounter: Secondary | ICD-10-CM | POA: Insufficient documentation

## 2024-07-25 DIAGNOSIS — I10 Essential (primary) hypertension: Secondary | ICD-10-CM | POA: Insufficient documentation

## 2024-07-25 DIAGNOSIS — Z9221 Personal history of antineoplastic chemotherapy: Secondary | ICD-10-CM | POA: Insufficient documentation

## 2024-07-25 DIAGNOSIS — F17210 Nicotine dependence, cigarettes, uncomplicated: Secondary | ICD-10-CM

## 2024-07-25 NOTE — Progress Notes (Signed)
 PATIENT NAVIGATOR PROGRESS NOTE  Name: VIENNE CORCORAN Date: 07/25/2024 MRN: [id number]  DOB: November 29, 1955   Reason for visit:  Initial Med/Onc visit with Dr. Onita Mattock  Comments:  Patient was seen during her initial visit with Dr. Mattock.  Patient was given my contact information and was instructed to contact the office with any questions or concerns.  Patient has an established treatment plan and has been receiving treatments at [facility].  Patient is transferring treatment to [facility] as it is closer to home.   Social work referral has been placed.    Time spent counseling/coordinating care: 30 minutes

## 2024-07-25 NOTE — Progress Notes (Signed)
 [facility]   Telephone:([phone]) [phone] Fax:(336) [phone]   Clinic New Consult Note   Patient Care Team: Celestia Rosaline SQUIBB, NP as PCP - General (Internal Medicine) Kate Lonni CROME, MD as PCP - Cardiology (Cardiology) Jama No (Inactive) (Cardiology) 07/25/2024  CHIEF COMPLAINTS/PURPOSE OF CONSULTATION:  Metastatic esophageal squamous cell carcinoma  REFERRING PHYSICIAN: Self-referral  Discussed the use of AI scribe software for clinical note transcription with the patient, who gave verbal consent to proceed.  History of Present Illness Veronica Stewart is a 68 year old female with metastatic esophageal cancer presenting for transfer of oncology care and continuation of chemotherapy and immunotherapy.  She initially presented with iron  deficient anemia in August 2024, and was seen by my partner Dr. Sherrod, endoscopy was recommended but was delayed until April 2024, which unfortunately confirmed squamous cell carcinoma in distal esophagus.  She established her care with Dr. Arletta at [facility].  Her initial PET scan in June 2024 showed mediastinal and supraclavicular nodal metastasis.  She has received FOLFOX chemotherapy since July 2025, and nivolumab immunotherapy was added on from cycle 3 for metastatic esophageal cancer, with her most recent cycle last week and the next planned for next week. She is eating and swallowing without dysphagia but reports persistent hunger and unintentional weight loss from 104 lbs to 101 lbs. She does not require a feeding tube.  She has tingling and cold sensation in her fingers, most noticeable during chemotherapy infusions and port access. She denies numbness, pain, swelling, fine motor impairment, nausea, dark urine, or other genitourinary symptoms.  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)     SURGICAL HISTORY: Past Surgical History:  Procedure Laterality Date   ABDOMINAL AORTOGRAM W/LOWER EXTREMITY Bilateral 03/05/2020   Procedure: ABDOMINAL AORTOGRAM W/LOWER EXTREMITY;  Surgeon: Gretta Lonni PARAS, MD;  Location: [facility] IN[facility]IVE CV [facility];  Service: Cardiovascular;  Laterality: Bilateral;   COLONOSCOPY WITH PROPOFOL  N/A 12/08/2023   Procedure: COLONOSCOPY WITH PROPOFOL ;  Surgeon: Federico Rosario BROCKS, MD;  Location: [facility] ENDOSCOPY;  Service: Gastroenterology;  Laterality: N/A;   ENDARTERECTOMY FEMORAL Left 03/24/2020   Procedure: LEFT COMMON FEMORAL ENDARTERECTOMY;  Surgeon: Gretta Lonni PARAS, MD;  Location: [facility] OR;  Service: Vascular;  Laterality: Left;   ENDOVEIN HARVEST OF GREATER SAPHENOUS VEIN Left 03/24/2020   Procedure: HARVEST OF GREATER SAPHENOUS VEIN;  Surgeon: Gretta Lonni PARAS, MD;  Location: [facility] OR;  Service: Vascular;  Laterality: Left;   ESOPHAGOGASTRODUODENOSCOPY (EGD) WITH PROPOFOL  N/A 12/08/2023   Procedure: ESOPHAGOGASTRODUODENOSCOPY (EGD) WITH PROPOFOL ;  Surgeon: Federico Rosario BROCKS, MD;  Location: [facility] ENDOSCOPY;  Service: Gastroenterology;  Laterality: N/A;   FEMORAL-POPLITEAL BYPASS GRAFT Left 03/24/2020   Procedure: BYPASS GRAFT FEMORAL-POPLITEAL ARTERY;  Surgeon: Gretta Lonni PARAS, MD;  Location: [facility] OR;  Service: Vascular;  Laterality: Left;   INSERTION OF ILIAC STENT Left 03/24/2020   Procedure: INSERTION OF RETROGRADE ILIAC STENT;  Surgeon: Gretta Lonni PARAS, MD;  Location: [facility] OR;  Service: Vascular;  Laterality: Left;   INTRAMEDULLARY (IM) NAIL INTERTROCHANTERIC Left 03/18/2018   Procedure: INTRAMEDULLARY (IM) NAIL INTERTROCHANTRIC;  Surgeon: Kendal Franky SQUIBB, MD;  Location: [facility]  OR;  Service: Orthopedics;  Laterality: Left;   INTRAOPERATIVE  ARTERIOGRAM Left 03/24/2020   Procedure: INTRA OPERATIVE ARTERIOGRAM;  Surgeon: Gretta Lonni PARAS, MD;  Location: [facility] OR;  Service: Vascular;  Laterality: Left;    SOCIAL HISTORY: Social History   Socioeconomic History   Marital status: Legally Separated    Spouse name: Not on file   Number of children: 9   Years of education: Not on file   Highest education level: Not on file  Occupational History   Not on file  Tobacco Use   Smoking status: Every Day    Current packs/day: 0.50    Types: Cigarettes   Smokeless tobacco: Never   Tobacco comments:    01/17/2024 Patient smokes about 6-7 cigarettes daily  Vaping Use   Vaping status: Never Used  Substance and Sexual Activity   Alcohol use: Yes    Comment: used to drink daily, occaional drinker now   Drug use: Not Currently    Types: Marijuana   Sexual activity: Not on file  Other Topics Concern   Not on file  Social History Narrative   Not on file   Social Drivers of Health   Financial Resource Strain: Not on file  Food Insecurity: Low Risk (10/13/2023)   Received from [facility]   Hunger Vital Sign    Within the past 12 months, you worried that your food would run out before you got money to buy more: Never true    Within the past 12 months, the food you bought just didn't last and you didn't have money to get more. : Never true  Transportation Needs: No Transportation Needs (10/13/2023)   Received from [facility]    In the past 12 months, has lack of reliable transportation kept you from medical appointments, meetings, work or from getting things needed for daily living? : No  Physical Activity: Not on file  Stress: Not on file  Social Connections: Not on file  Intimate Partner Violence: Not on file    FAMILY HISTORY: Family History  Problem Relation Age of Onset   Breast cancer Paternal Aunt    Sudden Cardiac Death Neg Hx     ALLERGIES:  has no known allergies.  MEDICATIONS:  Current  Outpatient Medications  Medication Sig Dispense Refill   acetaminophen  (TYLENOL ) 500 MG tablet Take 500 mg by mouth every 6 (six) hours as needed for mild pain.     acetaminophen  (TYLENOL ) 650 MG CR tablet Take 650 mg by mouth every 8 (eight) hours as needed for pain. (Patient not taking: Reported on 01/17/2024)     albuterol  (VENTOLIN  HFA) 108 (90 Base) MCG/ACT inhaler INHALE 2 PUFFS BY MOUTH EVERY 6 HOURS AS NEEDED FOR WHEEZING AND SHORTNESS OF BREATH (Patient not taking: Reported on 01/17/2024) 18 g 2   amLODipine  (NORVASC ) 10 MG tablet TAKE 1 TABLET(10 MG) BY MOUTH DAILY 90 tablet 2   aspirin  EC 81 MG tablet Take 81 mg by mouth daily. Swallow whole.     DEEP SEA NASAL SPRAY 0.65 % nasal spray SMARTSIG:Both Nares     Emollient (EUCERIN INTENSIVE REPAIR) LOTN Apply 1 Application topically daily at 6 (six) AM.     FeFum-FePoly-FA-B Cmp-C-Biot (INTEGRA PLUS ) CAPS Take 1 capsule by mouth every morning. 30 capsule 2   fluticasone  (FLONASE ) 50 MCG/ACT nasal spray SHAKE LIQUID AND USE 2 SPRAYS IN EACH NOSTRIL DAILY 48 g 0   Fluticasone -Salmeterol (ADVAIR) 100-50 MCG/DOSE AEPB Inhale 1 puff into the  lungs 2 (two) times daily. 1 each 3   hydrALAZINE  (APRESOLINE ) 25 MG tablet TAKE 1 TABLET BY MOUTH TWICE  DAILY 200 tablet 2   loratadine  (CLARITIN ) 10 MG tablet TAKE 1 TABLET(10 MG) BY MOUTH DAILY 30 tablet 0   metoprolol  succinate (TOPROL -XL) 25 MG 24 hr tablet TAKE 1 TABLET BY MOUTH DAILY  WITH A MEAL OR IMMEDIATELY AFTER THE SAME MEAL 100 tablet 3   ofloxacin (FLOXIN) 0.3 % OTIC solution Place 5 drops into both ears as needed. (Patient not taking: Reported on 01/17/2024)     ondansetron  (ZOFRAN ) 8 MG tablet Take 8 mg by mouth 3 (three) times daily. (Patient not taking: Reported on 01/17/2024)     pantoprazole  (PROTONIX ) 40 MG tablet TAKE 1 TABLET(40 MG) BY MOUTH TWICE DAILY 180 tablet 3   No current facility-administered medications for this visit.    REVIEW OF SYSTEMS:   Constitutional: Denies fevers,  chills or abnormal night sweats Eyes: Denies blurriness of vision, double vision or watery eyes Ears, nose, mouth, throat, and face: Denies mucositis or sore throat Respiratory: Denies cough, dyspnea or wheezes Cardiovascular: Denies palpitation, chest discomfort or lower extremity swelling Gastrointestinal:  Denies nausea, heartburn or change in bowel habits Skin: Denies abnormal skin rashes Lymphatics: Denies new lymphadenopathy or easy bruising Neurological:Denies numbness, tingling or new weaknesses Behavioral/Psych: Mood is stable, no new changes  All other systems were reviewed with the patient and are negative.  PHYSICAL EXAMINATION: ECOG PERFORMANCE STATUS: 1 - Symptomatic but completely ambulatory  Vitals:   07/25/24 1544 07/25/24 1551  BP: (!) 164/106 (!) 149/110  Pulse: 93 92  Temp: 97.8 F (36.6 C)   SpO2: 96% 97%   Filed Weights   07/25/24 1544  Weight: 101 lb 6.4 oz (46 kg)    GENERAL:alert, no distress and comfortable SKIN: skin color, texture, turgor are normal, no rashes or significant lesions EYES: normal, conjunctiva are pink and non-injected, sclera clear OROPHARYNX:no exudate, no erythema and lips, buccal mucosa, and tongue normal  NECK: supple, thyroid normal size, non-tender, without nodularity LYMPH:  no palpable lymphadenopathy in the cervical, axillary or inguinal LUNGS: clear to auscultation and percussion with normal breathing effort HEART: regular rate & rhythm and no murmurs and no lower extremity edema ABDOMEN:abdomen soft, non-tender and normal bowel sounds Musculoskeletal:no cyanosis of digits and no clubbing  PSYCH: alert & oriented x 3 with fluent speech NEURO: no focal motor/sensory deficits  Physical Exam    LABORATORY DATA:  I have reviewed the data as listed    Latest Ref Rng & Units 12/19/2023    2:43 PM 09/20/2023    9:26 AM 08/22/2023    3:12 PM  CBC  WBC 4.0 - 10.5 K/uL 7.4  5.1  5.5   Hemoglobin 12.0 - 15.0 g/dL 8.6  87.1   88.0   Hematocrit 36.0 - 46.0 % 26.3  40.6  37.1   Platelets 150 - 400 K/uL 370  265  278     @cmpl @  RADIOGRAPHIC STUDIES: I have personally reviewed the radiological images as listed and agreed with the findings in the report. No results found.  Assessment & Plan Stage IV metastatic esophageal cancer She is receiving FOLFOX chemotherapy and nivolumab immunotherapy since July 2025.  Restaging CT scan in October 2025 showed good partial response.  She maintains oral intake with persistent hunger and mild weight loss, without pain, swelling, or dark urine. Her port remains functional and she does not require enteral feeding. She prefers to continue  treatment at the current facility. - Ordered PET scan for next month to evaluate disease activity. - Planned to consider transition to maintenance therapy capecitabine and nivolumab if PET scan demonstrates favorable response. - Scheduled next chemotherapy infusion in our office next week if we can accommodate.  If not, she will get last cycle chemo at [facility] next week, and we will plan to see her back the first week after the new year just resume chemo.  - Provided anticipatory guidance regarding blood draw prior to infusion if performed locally.  Chemotherapy-induced peripheral neuropathy She experiences mild tingling in her fingers during chemotherapy infusions, without numbness or pain. She retains fine motor function and symptoms do not impair daily activities.  Hypertension She is treated with amlodipine , hydralazine , and metoprolol . She denies cardiac symptoms or complications.  Bed bug at home - She states she still has bedbugs at home, the issue has not resolved. - Need isolation when she is here for treatment and visit.  Plan - I reviewed her outside medical records extensively, including office notes, lab, and scan images. - Will continue FOLFOX and nivolumab every 2 weeks.  I will check if we can get her started in our office  next Wednesday, if not, she will get 1 more cycle at [facility]. - Follow-up with the first week of January 2025 for treatment - Plan to obtain PET scan in January, if she has excellent response to chemotherapy, will consider transition to maintenance Xeloda and nivolumab every 4 weeks. - Isolation precaution for bedbugs for her future visits.    Orders Placed This Encounter  Procedures   CBC with Differential (Cancer Center Only)    Standing Status:   Future    Expected Date:   08/01/2024    Expiration Date:   08/01/2025   CMP (Cancer Center only)    Standing Status:   Future    Expected Date:   08/01/2024    Expiration Date:   08/01/2025   T4    Standing Status:   Future    Expected Date:   08/01/2024    Expiration Date:   08/01/2025   TSH    Standing Status:   Future    Expected Date:   08/01/2024    Expiration Date:   08/01/2025   CBC with Differential (Cancer Center Only)    Standing Status:   Future    Expected Date:   08/15/2024    Expiration Date:   08/15/2025   CMP (Cancer Center only)    Standing Status:   Future    Expected Date:   08/15/2024    Expiration Date:   08/15/2025    All questions were answered. The patient knows to call the clinic with any problems, questions or concerns. I spent 45 minutes counseling the patient face to face. The total time spent in the appointment was 60 minutes including review of chart and various tests results, discussions about plan of care and coordination of care plan.     Onita Mattock, MD 07/25/2024 5:50 PM

## 2024-07-26 ENCOUNTER — Other Ambulatory Visit: Payer: Self-pay

## 2024-07-26 ENCOUNTER — Telehealth: Payer: Self-pay

## 2024-07-26 ENCOUNTER — Encounter: Payer: Self-pay | Admitting: Hematology

## 2024-07-26 NOTE — Telephone Encounter (Signed)
 Contacted Provider Consult line at [facility] to request a call from Dr Darrelyn for Dr Lanny.  Provided Dr Demetra cellphone number for Dr Darrelyn to call.  Admin stated she would send the request to Dr Darrelyn.

## 2024-07-27 ENCOUNTER — Other Ambulatory Visit: Payer: Self-pay

## 2024-07-31 ENCOUNTER — Encounter (HOSPITAL_COMMUNITY): Payer: Self-pay

## 2024-07-31 ENCOUNTER — Ambulatory Visit (HOSPITAL_COMMUNITY)

## 2024-07-31 ENCOUNTER — Ambulatory Visit (HOSPITAL_COMMUNITY)
Admission: EM | Admit: 2024-07-31 | Discharge: 2024-07-31 | Disposition: A | Discharge status: Home / Self Care | Source: Home / Self Care

## 2024-07-31 DIAGNOSIS — S6990XA Unspecified injury of unspecified wrist, hand and finger(s), initial encounter: Secondary | ICD-10-CM | POA: Diagnosis not present

## 2024-07-31 MED ORDER — AMOXICILLIN-POT CLAVULANATE 500-125 MG PO TABS: 1.0000 | ORAL_TABLET | Freq: Two times a day (BID) | ORAL | 0 refills | Status: AC

## 2024-07-31 NOTE — Progress Notes (Addendum)
 Pharmacist Chemotherapy Monitoring - Initial Assessment    Anticipated start date: 08/01/24   The following has been reviewed per standard work regarding the patient's treatment regimen: The patient's diagnosis, treatment plan and drug doses, and organ/hematologic function Lab orders and baseline tests specific to treatment regimen  The treatment plan start date, drug sequencing, and pre-medications Prior authorization status  Patient's documented medication list, including drug-drug interaction screen and prescriptions for anti-emetics and supportive care specific to the treatment regimen The drug concentrations, fluid compatibility, administration routes, and timing of the medications to be used The patient's access for treatment and lifetime cumulative dose history, if applicable  The patient's medication allergies and previous infusion related reactions, if applicable   Changes made to treatment plan:  N/A  Follow up needed:  Receiving modified FOLFOX + nivolumab 240 mg every 2 weeks at [facility] - remove 5-FU bolus from treatment plan? Okay to proceed with nivolumab 480 mg every 4 weeks? SM to MD  Harlene JONELLE Nasuti, RPH, 07/31/2024  4:12 PM  ADDENDUM: 5-FU bolus removed from treatment plan per Dr. Lanny. Okay to proceed with nivolumab every 4 weeks per MD.  Harlene Nasuti, PharmD Oncology Infusion Pharmacist 07/31/2024 4:38 PM

## 2024-07-31 NOTE — Discharge Instructions (Addendum)
 Your finger x-ray is negative for fracture or dislocation. You have an infection at your nailbed.  You have been prescribed Augmentin .  Take twice a day for 5 days.  You can apply ice for 15-20 minutes at a time 3-4 times a day to help with swelling. Keep your finger elevated to help reduce swelling. Salt water soak at the tip of your finger 3-4 times daily. Take Tylenol  for pain.  If you develop any new or worsening symptoms or if your symptoms do not start to improve, please follow-up here or with your primary care provider.  If your symptoms are severe, please go to the emergency room.

## 2024-07-31 NOTE — ED Provider Notes (Signed)
 [facility]    CSN: [id number] Arrival date & time: 07/31/24  1058      History   Chief Complaint Chief Complaint  Patient presents with   Finger Injury    HPI Veronica Stewart is a 68 y.o. female.   This 68 year old female is being seen for complaints of finger injury.  She reports that on Saturday or Sunday she slammed her left ring finger in a screen door.  She reports since that time she has had increasing swelling to her fingertip.  She reports yesterday there was yellow drainage.  This has stopped today and there is a scab at the site of drainage.  She has 3 rings on the left ring finger that she is unable to remove due to swelling.  She denies fever, chills.  She denies headache, blurry vision, syncope.  She denies chest pain, shortness of breath.       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[facility])     Patient Active Problem List   Diagnosis Date Noted   Esophageal cancer, stage IV (HCC) 07/25/2024   Acute on chronic combined systolic (congestive) and diastolic (congestive) heart failure (HCC) 01/25/2024   Porokeratosis 09/08/2022   Pain due to onychomycosis of toenails of both feet 06/08/2022   Atrial flutter (HCC) 05/06/2022   Chronic systolic heart failure (HCC) 05/06/2022   Cytopenia 08/25/2021   CINV (chemotherapy-induced nausea and vomiting) 07/14/2021   Encounter for antineoplastic chemotherapy 07/14/2021   Gastrostomy tube in place (HCC) 07/14/2021   Anemia of chronic disease 06/05/2021   Hyponatremia 06/05/2021   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   Anticoagulant long-term use 03/17/2021   Nasal cavity mass 03/17/2021   Acute blood loss anemia    Uncontrolled atrial flutter (HCC) 02/11/2021   Epistaxis    PAD (peripheral artery disease) 03/24/2020   Iron   deficiency anemia    Hypertension 03/18/2018   Asthma 03/18/2018   Protein-calorie malnutrition, severe 03/18/2018    Past Surgical History:  Procedure Laterality Date   ABDOMINAL AORTOGRAM W/LOWER EXTREMITY Bilateral 03/05/2020   Procedure: ABDOMINAL AORTOGRAM W/LOWER EXTREMITY;  Surgeon: Gretta Lonni PARAS, MD;  [facility];  Service: Cardiovascular;  Laterality: Bilateral;   COLONOSCOPY WITH PROPOFOL  N/A 12/08/2023   Procedure: COLONOSCOPY WITH PROPOFOL ;  Surgeon: Federico Rosario BROCKS, MD;  Location: [facility];  Service: Gastroenterology;  Laterality: N/A;   ENDARTERECTOMY FEMORAL Left 03/24/2020   Procedure: LEFT COMMON FEMORAL ENDARTERECTOMY;  Surgeon: Gretta Lonni PARAS, MD;  Location: [facility] OR;  Service: Vascular;  Laterality: Left;   ENDOVEIN HARVEST OF GREATER SAPHENOUS VEIN Left 03/24/2020   Procedure: HARVEST OF GREATER SAPHENOUS VEIN;  Surgeon: Gretta Lonni PARAS, MD;  Location: [facility] OR;  Service: Vascular;  Laterality: Left;   ESOPHAGOGASTRODUODENOSCOPY (EGD) WITH PROPOFOL  N/A 12/08/2023   Procedure: ESOPHAGOGASTRODUODENOSCOPY (EGD) WITH PROPOFOL ;  Surgeon: Federico Rosario BROCKS, MD;  Location: [facility];  Service: Gastroenterology;  Laterality: N/A;   FEMORAL-POPLITEAL BYPASS GRAFT Left 03/24/2020   Procedure: BYPASS GRAFT FEMORAL-POPLITEAL ARTERY;  Surgeon: Gretta Lonni PARAS, MD;  Location: [facility] OR;  Service: Vascular;  Laterality: Left;   INSERTION OF ILIAC STENT Left 03/24/2020   Procedure: INSERTION OF RETROGRADE ILIAC STENT;  Surgeon: Gretta Lonni PARAS, MD;  [facility];  Service: Vascular;  Laterality:  Left;   INTRAMEDULLARY (IM) NAIL INTERTROCHANTERIC Left 03/18/2018   Procedure: INTRAMEDULLARY (IM) NAIL INTERTROCHANTRIC;  Surgeon: Kendal Franky SQUIBB, MD;  Location[facility];  Service: Orthopedics;  Laterality: Left;   INTRAOPERATIVE ARTERIOGRAM Left 03/24/2020   Procedure: INTRA OPERATIVE ARTERIOGRAM;  Surgeon: Gretta Lonni PARAS, MD;  Location: [facility] OR;  Service:  Vascular;  Laterality: Left;    OB History   No obstetric history on file.      Home Medications    Prior to Admission medications  Medication Sig Start Date End Date Taking? Authorizing Provider  amoxicillin -clavulanate (AUGMENTIN ) 500-125 MG tablet Take 1 tablet by mouth every 12 (twelve) hours for 5 days. 07/31/24 08/05/24 Yes Phynix Horton C, FNP  acetaminophen  (TYLENOL ) 500 MG tablet Take 500 mg by mouth every 6 (six) hours as needed for mild pain.    [provider]  acetaminophen  (TYLENOL ) 650 MG CR tablet Take 650 mg by mouth every 8 (eight) hours as needed for pain. Patient not taking: Reported on 01/17/2024    [provider]  albuterol  (VENTOLIN  HFA) 108 (90 Base) MCG/ACT inhaler INHALE 2 PUFFS BY MOUTH EVERY 6 HOURS AS NEEDED FOR WHEEZING AND SHORTNESS OF BREATH Patient not taking: Reported on 01/17/2024 10/25/23   Celestia Rosaline SQUIBB, NP  amLODipine  (NORVASC ) 10 MG tablet TAKE 1 TABLET(10 MG) BY MOUTH DAILY 07/10/24   Kate Lonni CROME, MD  aspirin  EC 81 MG tablet Take 81 mg by mouth daily. Swallow whole.    [provider]  DEEP SEA NASAL SPRAY 0.65 % nasal spray SMARTSIG:Both Nares 09/21/21   [provider]  Emollient (EUCERIN INTENSIVE REPAIR) LOTN Apply 1 Application topically daily at 6 (six) AM. 06/19/21   [provider]  FeFum-FePoly-FA-B Cmp-C-Biot (INTEGRA PLUS ) CAPS Take 1 capsule by mouth every morning. 07/12/23   Heilingoetter, Cassandra L, PA-C  fluticasone  (FLONASE ) 50 MCG/ACT nasal spray SHAKE LIQUID AND USE 2 SPRAYS IN EACH NOSTRIL DAILY 06/10/22   Celestia Rosaline SQUIBB, NP  Fluticasone -Salmeterol (ADVAIR) 100-50 MCG/DOSE AEPB Inhale 1 puff into the lungs 2 (two) times daily. 12/06/19   Celestia Rosaline SQUIBB, NP  hydrALAZINE  (APRESOLINE ) 25 MG tablet TAKE 1 TABLET BY MOUTH TWICE  DAILY 05/15/24   Kate Lonni CROME, MD  loratadine  (CLARITIN ) 10 MG tablet TAKE 1 TABLET(10 MG) BY MOUTH DAILY 06/19/24   Celestia Rosaline SQUIBB, NP  metoprolol  succinate (TOPROL -XL) 25 MG 24 hr tablet TAKE 1 TABLET BY MOUTH DAILY  WITH A MEAL OR IMMEDIATELY AFTER THE SAME MEAL 11/29/23   Kate Lonni CROME, MD  ofloxacin (FLOXIN) 0.3 % OTIC solution Place 5 drops into both ears as needed. Patient not taking: Reported on 01/17/2024 12/10/23   [provider]  ondansetron  (ZOFRAN ) 8 MG tablet Take 8 mg by mouth 3 (three) times daily. Patient not taking: Reported on 01/17/2024 11/18/21   [provider]  pantoprazole  (PROTONIX ) 40 MG tablet TAKE 1 TABLET(40 MG) BY MOUTH TWICE DAILY 05/06/23   Celestia Rosaline SQUIBB, NP    Family History Family History  Problem Relation Age of Onset   Breast cancer Paternal Aunt    Sudden Cardiac Death Neg Hx     Social History Social History[1]   Allergies   Patient has no known allergies.   Review of Systems Review of Systems  Constitutional:  Negative for chills and fever.  Respiratory:  Negative for shortness of breath.   Cardiovascular:  Negative for chest pain.  Musculoskeletal:  Positive for arthralgias.  Skin:  Positive for  wound. Negative for color change and rash.  Neurological:  Negative for dizziness, syncope and headaches.  All other systems reviewed and are negative.    Physical Exam Triage Vital Signs ED Triage Vitals [07/31/24 1136]  Encounter Vitals Group     BP (!) 136/92     Girls Systolic BP Percentile      Girls Diastolic BP Percentile      Boys Systolic BP Percentile      Boys Diastolic BP Percentile      Pulse Rate 84     Resp 17     Temp (!) 97.5 F (36.4 C)     Temp Source Oral     SpO2 95 %     Weight      Height      Head Circumference      Peak Flow      Pain Score      Pain Loc      Pain Education      Exclude from Growth Chart    No data found.  Updated Vital Signs BP (!) 136/92 (BP Location: Right Arm)   Pulse 84   Temp (!) 97.5 F (36.4 C) (Oral)   Resp 17   SpO2 95%   Visual Acuity Right Eye Distance:   Left Eye  Distance:   Bilateral Distance:    Right Eye Near:   Left Eye Near:    Bilateral Near:     Physical Exam Vitals and nursing note reviewed.  Constitutional:      General: She is awake. She is not in acute distress.    Appearance: She is well-developed. She is not ill-appearing or toxic-appearing.     Comments: Pleasant female appearing stated age found sitting in chair in no acute distress.  HENT:     Head: Normocephalic and atraumatic.  Eyes:     Conjunctiva/sclera: Conjunctivae normal.  Cardiovascular:     Rate and Rhythm: Normal rate and regular rhythm.     Heart sounds: Normal heart sounds. No murmur heard. Pulmonary:     Effort: Pulmonary effort is normal. No respiratory distress.     Breath sounds: Normal breath sounds.  Musculoskeletal:     Left hand: Swelling and bony tenderness present. Normal capillary refill.     Comments: Left ring finger  Skin:    General: Skin is warm and dry.     Capillary Refill: Capillary refill takes less than 2 seconds.     Findings: Abscess and signs of injury present.     Comments: Paronychia left ring finger.  Neurological:     Mental Status: She is alert.  Psychiatric:        Mood and Affect: Mood normal.        Behavior: Behavior is cooperative.      UC Treatments / Results  Labs (all labs ordered are listed, but only abnormal results are displayed) Labs Reviewed - No data to display  EKG   Radiology DG Finger Ring Left Result Date: 07/31/2024 CLINICAL DATA:  Left fourth finger pain after injury in door EXAM: LEFT RING FINGER 2+V COMPARISON:  None Available. FINDINGS: There is no evidence of fracture or dislocation involving visualized bone. Fourth proximal phalanx is not well visualized due to ring. There is no evidence of arthropathy or other focal bone abnormality. Soft tissues are unremarkable. IMPRESSION: No definite fracture or dislocation is noted. Fourth proximal phalanx is not well visualized due to ring.  Electronically Signed   By: Lynwood  Landy Raddle M.D.   On: 07/31/2024 12:39    Procedures Procedures (including critical care time)  Medications Ordered in UC Medications - No data to display  Initial Impression / Assessment and Plan / UC Course  I have reviewed the triage vital signs and the nursing notes.  Pertinent labs & imaging results that were available during my care of the patient were reviewed by me and considered in my medical decision making (see chart for details).     Vitals and triage reviewed, patient is hemodynamically stable.  Left ring finger x-ray is negative for acute fracture or dislocation.  Paronychia left ring finger.  2 silver-colored metal rings were cut off, placed in specimen cup and given to patient.  Third silver-colored ring was unable to be cut off.  There is more space on the finger now that the other 2 rings are removed.  She is neurovascularly intact.  She is given a prescription for Augmentin , reduced dose due to creatinine clearance of 24.68.  Advised ice proximal finger.  Advised salt water soak distal finger 3-4 times daily.  Plan of care, follow-up care, return precautions given, no questions at this time. Final Clinical Impressions(s) / UC Diagnoses   Final diagnoses:  Finger injury, initial encounter     Discharge Instructions      Your finger x-ray is negative for fracture or dislocation. You have an infection at your nailbed.  You have been prescribed Augmentin .  Take twice a day for 5 days.  You can apply ice for 15-20 minutes at a time 3-4 times a day to help with swelling. Keep your finger elevated to help reduce swelling. Salt water soak at the tip of your finger 3-4 times daily. Take Tylenol  for pain.  If you develop any new or worsening symptoms or if your symptoms do not start to improve, please follow-up here or with your primary care provider.  If your symptoms are severe, please go to the emergency room.     ED Prescriptions      Medication Sig Dispense Auth. Provider   amoxicillin -clavulanate (AUGMENTIN ) 500-125 MG tablet Take 1 tablet by mouth every 12 (twelve) hours for 5 days. 10 tablet Marybeth Dandy C, FNP      PDMP not reviewed this encounter.     [1]  Social History Tobacco Use   Smoking status: Every Day    Current packs/day: 0.50    Types: Cigarettes   Smokeless tobacco: Never   Tobacco comments:    01/17/2024 Patient smokes about 6-7 cigarettes daily  Vaping Use   Vaping status: Never Used  Substance Use Topics   Alcohol use: Yes    Comment: used to drink daily, occaional drinker now   Drug use: Not Currently    Types: Marijuana     Lennice Jon BROCKS, FNP 07/31/24 1329

## 2024-07-31 NOTE — ED Triage Notes (Signed)
 Pt  has c/o left ring finger pain due to shutting finger in screen door at home. Pt states,there was yellow discharge coming from my finger yesterday and Sunday. Swelling noted to finger.

## 2024-08-01 ENCOUNTER — Inpatient Hospital Stay

## 2024-08-02 ENCOUNTER — Other Ambulatory Visit: Payer: Self-pay | Admitting: Nurse Practitioner

## 2024-08-02 DIAGNOSIS — C159 Malignant neoplasm of esophagus, unspecified: Secondary | ICD-10-CM

## 2024-08-02 NOTE — Progress Notes (Signed)
 PATIENT NAVIGATOR PROGRESS NOTE  Name: Veronica Stewart Date: 08/02/2024 MRN: [id number]  DOB: 24-Jun-1956   Patient was seen in our office on 12/10 with plans to transfer her oncology care to our facility.  Patient was subsequently scheduled for treatment on 12/17 but did not show for the appointment. Per Care Everywhere, patient received her treatment at [facility] on 12/17 and has future treatments scheduled there as well.  Contact patient today to assess her continued interest in transferring her care.  Patient stated she was unaware of the 12/17 appointment at our facility and remains interested in transferring her care to [facility].  Patient requested that her next treatment, scheduled for 12/31, be administered at [facility].  Advised patient that accomodation may not be possible due to the upcoming holidays and short notice, but that treatments could be scheduled at [facility] beginning in January. Patient was instructed to keep her scheduled appointment at [facility] on 12/31, and was informed that our scheduling team will contact her to arrange future treatment.  Patient verbalized understanding and agreement with plan.   Scheduling message sent.   Time spent counseling/coordinating care: 30-45 minutes

## 2024-08-03 ENCOUNTER — Inpatient Hospital Stay: Admitting: Licensed Clinical Social Worker

## 2024-08-03 DIAGNOSIS — C154 Malignant neoplasm of middle third of esophagus: Secondary | ICD-10-CM

## 2024-08-03 NOTE — Progress Notes (Signed)
 [facility] Clinical Social Work  Initial Assessment   Veronica Stewart is a 68 y.o. year old female contacted by phone. Clinical Social Work was referred by medical provider for assessment of psychosocial needs.   SDOH (Social Determinants of Health) assessments performed: Yes SDOH Interventions    Flowsheet Row Telephone from 02/27/2020 in [facility]  SDOH Interventions   Transportation Interventions [facility]    SDOH Screenings   Food Insecurity: Low Risk (10/13/2023)   Received from [facility]  Housing: Low Risk (10/13/2023)   Received from [facility]  Transportation Needs: No Transportation Needs (10/13/2023)   Received from [facility]  Utilities: Low Risk (10/13/2023)   Received from [facility]  Depression (PHQ2-9): Low Risk (01/25/2024)  Tobacco Use: High Risk (08/01/2024)   Received from [facility]    PHQ 2/9:    01/25/2024   11:10 AM 04/26/2023   11:01 AM 11/04/2022    4:00 PM  Depression screen PHQ 2/9  Decreased Interest 0 0 0  Down, Depressed, Hopeless 0 0 0  PHQ - 2 Score 0 0 0  Altered sleeping  0   Tired, decreased energy  0   Change in appetite  0   Feeling bad or failure about yourself   0   Trouble concentrating  0   Moving slowly or fidgety/restless  0   Suicidal thoughts  0   PHQ-9 Score  0       Data saved with a previous flowsheet row definition     Distress Screen completed: No     No data to display            Family/Social Information:  Housing Arrangement: patient lives with her aunt and uncle .  Pt reports using a walker and cane for ambulation Family members/support persons in your life? Pt reports having a number of family members who reside locally that are able to assist as needed. Transportation concerns: no, pt states her cousin is available to bring her to all appointments  Employment: Retired .  Income source: Actor concerns: Yes, current concerns Type of  concern: Utilities and extermination of bed bugs Food access concerns: no Religious or spiritual practice: Yes-  Advanced directives: Not known Services Currently in place:  none  Coping/ Adjustment to diagnosis: Patient understands treatment plan and what happens next? yes, pt is transferring her care from [facility] to [facility] Concerns about diagnosis and/or treatment: Quality of life Patient reported stressors: Therapist, Art and/or priorities: pt's priority is to continue treatment w/ the hope of positive results Patient enjoys time with family/ friends Current coping skills/ strengths: Motivation for treatment/growth     SUMMARY: Current SDOH Barriers:  Financial constraints related to fixed income  Clinical Social Work Clinical Goal(s):  Scientist, research (life sciences) options for unmet needs related to:  Financial Strain   Interventions: Discussed common feeling and emotions when being diagnosed with cancer, and the importance of support during treatment Informed patient of the support team roles and support services at [facility] Provided CSW contact information and encouraged patient to call with any questions or concerns Pt reports there are bed bugs in the home; however, they have not had funds available to hire an exterminator so they have not been addressed.  Pt receives SNAP benefits, meeting presumptive eligibility for financial assistance.  Pt reports she resides in a house which they rent.  CSW instructed pt to contact the landlord regarding the bed bugs.  It may be  the landlord's responsibility to pay for extermination.  Pt informed CSW the landlord will need to be in agreement w/ [employer] assessing the home and will need to be contacted directly in order for [facility] to be able to assist w/ the cost of extermination.  Pt verbalized agreement to speak w/ her aunt and uncle regarding the need to contact the landlord.  CSW to f/u w/ pt after Christmas to see if everyone is in agreement to have the home  assessed.    Follow Up Plan: CSW will follow-up with patient by phone  Patient verbalizes understanding of plan: Yes    Devere Veronica Manna, LCSW Clinical Social Worker [facility]

## 2024-08-07 ENCOUNTER — Other Ambulatory Visit (INDEPENDENT_AMBULATORY_CARE_PROVIDER_SITE_OTHER): Payer: Self-pay | Admitting: Primary Care

## 2024-08-07 ENCOUNTER — Ambulatory Visit (INDEPENDENT_AMBULATORY_CARE_PROVIDER_SITE_OTHER): Admitting: Primary Care

## 2024-08-07 NOTE — Telephone Encounter (Signed)
 Will forward to provider

## 2024-08-20 ENCOUNTER — Ambulatory Visit (INDEPENDENT_AMBULATORY_CARE_PROVIDER_SITE_OTHER): Admitting: Primary Care

## 2024-08-28 NOTE — Assessment & Plan Note (Signed)
 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 -Diagnosed in April 2024.  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 -She started first-line chemotherapy FOLFOX in July 2025, Nivo was added on from cycle 3.

## 2024-08-29 ENCOUNTER — Inpatient Hospital Stay (HOSPITAL_BASED_OUTPATIENT_CLINIC_OR_DEPARTMENT_OTHER): Admitting: Hematology

## 2024-08-29 ENCOUNTER — Inpatient Hospital Stay

## 2024-08-29 ENCOUNTER — Inpatient Hospital Stay: Attending: Physician Assistant

## 2024-08-29 VITALS — BP 164/100 | HR 76 | Temp 97.5°F | Resp 16 | Wt 97.4 lb

## 2024-08-29 DIAGNOSIS — Z79620 Long term (current) use of immunosuppressive biologic: Secondary | ICD-10-CM | POA: Insufficient documentation

## 2024-08-29 DIAGNOSIS — Z5112 Encounter for antineoplastic immunotherapy: Secondary | ICD-10-CM | POA: Insufficient documentation

## 2024-08-29 DIAGNOSIS — C155 Malignant neoplasm of lower third of esophagus: Secondary | ICD-10-CM | POA: Diagnosis present

## 2024-08-29 DIAGNOSIS — Z5111 Encounter for antineoplastic chemotherapy: Secondary | ICD-10-CM | POA: Diagnosis present

## 2024-08-29 DIAGNOSIS — D509 Iron deficiency anemia, unspecified: Secondary | ICD-10-CM | POA: Diagnosis not present

## 2024-08-29 DIAGNOSIS — Z79899 Other long term (current) drug therapy: Secondary | ICD-10-CM | POA: Diagnosis not present

## 2024-08-29 DIAGNOSIS — Z452 Encounter for adjustment and management of vascular access device: Secondary | ICD-10-CM | POA: Diagnosis not present

## 2024-08-29 DIAGNOSIS — C159 Malignant neoplasm of esophagus, unspecified: Secondary | ICD-10-CM

## 2024-08-29 DIAGNOSIS — C154 Malignant neoplasm of middle third of esophagus: Secondary | ICD-10-CM

## 2024-08-29 DIAGNOSIS — C770 Secondary and unspecified malignant neoplasm of lymph nodes of head, face and neck: Secondary | ICD-10-CM | POA: Insufficient documentation

## 2024-08-29 LAB — CBC WITH DIFFERENTIAL (CANCER CENTER ONLY)
Abs Immature Granulocytes: 0.02 K/uL (ref 0.00–0.07)
Basophils Absolute: 0 K/uL (ref 0.0–0.1)
Basophils Relative: 1 %
Eosinophils Absolute: 0.4 K/uL (ref 0.0–0.5)
Eosinophils Relative: 11 %
HCT: 27.5 % — ABNORMAL LOW (ref 36.0–46.0)
Hemoglobin: 8.4 g/dL — ABNORMAL LOW (ref 12.0–15.0)
Immature Granulocytes: 1 %
Lymphocytes Relative: 19 %
Lymphs Abs: 0.7 K/uL (ref 0.7–4.0)
MCH: 25.5 pg — ABNORMAL LOW (ref 26.0–34.0)
MCHC: 30.5 g/dL (ref 30.0–36.0)
MCV: 83.3 fL (ref 80.0–100.0)
Monocytes Absolute: 0.9 K/uL (ref 0.1–1.0)
Monocytes Relative: 27 %
Neutro Abs: 1.5 K/uL — ABNORMAL LOW (ref 1.7–7.7)
Neutrophils Relative %: 41 %
Platelet Count: 215 K/uL (ref 150–400)
RBC: 3.3 MIL/uL — ABNORMAL LOW (ref 3.87–5.11)
RDW: 21.6 % — ABNORMAL HIGH (ref 11.5–15.5)
WBC Count: 3.5 K/uL — ABNORMAL LOW (ref 4.0–10.5)
nRBC: 0 % (ref 0.0–0.2)

## 2024-08-29 LAB — IRON AND IRON BINDING CAPACITY (CC-WL,HP ONLY)
Iron: 21 ug/dL — ABNORMAL LOW (ref 28–170)
Saturation Ratios: 5 % — ABNORMAL LOW (ref 10.4–31.8)
TIBC: 382 ug/dL (ref 250–450)
UIBC: 362 ug/dL

## 2024-08-29 LAB — CMP (CANCER CENTER ONLY)
ALT: 6 U/L (ref 0–44)
AST: 23 U/L (ref 15–41)
Albumin: 3.9 g/dL (ref 3.5–5.0)
Alkaline Phosphatase: 80 U/L (ref 38–126)
Anion gap: 12 (ref 5–15)
BUN: 19 mg/dL (ref 8–23)
CO2: 26 mmol/L (ref 22–32)
Calcium: 9.2 mg/dL (ref 8.9–10.3)
Chloride: 103 mmol/L (ref 98–111)
Creatinine: 1.11 mg/dL — ABNORMAL HIGH (ref 0.44–1.00)
GFR, Estimated: 54 mL/min — ABNORMAL LOW
Glucose, Bld: 82 mg/dL (ref 70–99)
Potassium: 4.5 mmol/L (ref 3.5–5.1)
Sodium: 140 mmol/L (ref 135–145)
Total Bilirubin: 0.3 mg/dL (ref 0.0–1.2)
Total Protein: 7.6 g/dL (ref 6.5–8.1)

## 2024-08-29 LAB — TSH: TSH: 3.17 u[IU]/mL (ref 0.350–4.500)

## 2024-08-29 LAB — FERRITIN: Ferritin: 35 ng/mL (ref 11–307)

## 2024-08-29 MED ORDER — SODIUM CHLORIDE 0.9 % IV SOLN
480.0000 mg | Freq: Once | INTRAVENOUS | Status: AC
  Administered 2024-08-29: 480 mg via INTRAVENOUS
  Filled 2024-08-29: qty 48

## 2024-08-29 MED ORDER — DEXAMETHASONE SOD PHOSPHATE PF 10 MG/ML IJ SOLN
10.0000 mg | Freq: Once | INTRAMUSCULAR | Status: AC
  Administered 2024-08-29: 10 mg via INTRAVENOUS
  Filled 2024-08-29: qty 1

## 2024-08-29 MED ORDER — LEUCOVORIN CALCIUM INJECTION 350 MG
400.0000 mg/m2 | Freq: Once | INTRAVENOUS | Status: AC
  Administered 2024-08-29: 600 mg via INTRAVENOUS
  Filled 2024-08-29: qty 30

## 2024-08-29 MED ORDER — SODIUM CHLORIDE 0.9 % IV SOLN
2400.0000 mg/m2 | INTRAVENOUS | Status: DC
  Administered 2024-08-29: 3500 mg via INTRAVENOUS
  Filled 2024-08-29: qty 70

## 2024-08-29 MED ORDER — PALONOSETRON HCL INJECTION 0.25 MG/5ML
0.2500 mg | Freq: Once | INTRAVENOUS | Status: AC
  Administered 2024-08-29: 0.25 mg via INTRAVENOUS
  Filled 2024-08-29: qty 5

## 2024-08-29 MED ORDER — SODIUM CHLORIDE 0.9 % IV SOLN: INTRAVENOUS | Status: DC

## 2024-08-29 MED ORDER — FAMOTIDINE IN NACL 20-0.9 MG/50ML-% IV SOLN
20.0000 mg | Freq: Once | INTRAVENOUS | Status: AC
  Administered 2024-08-29: 20 mg via INTRAVENOUS
  Filled 2024-08-29: qty 50

## 2024-08-29 MED ORDER — DEXTROSE 5 % IV SOLN: INTRAVENOUS | Status: DC

## 2024-08-29 MED ORDER — OXALIPLATIN CHEMO INJECTION 100 MG/20ML
70.0000 mg/m2 | Freq: Once | INTRAVENOUS | Status: AC
  Administered 2024-08-29: 100 mg via INTRAVENOUS
  Filled 2024-08-29: qty 20

## 2024-08-29 MED ORDER — DIPHENHYDRAMINE HCL 50 MG/ML IJ SOLN
25.0000 mg | Freq: Once | INTRAMUSCULAR | Status: AC
  Administered 2024-08-29: 25 mg via INTRAVENOUS
  Filled 2024-08-29: qty 1

## 2024-08-29 NOTE — Patient Instructions (Signed)
 [facility] - A DEPT OF [facility]  Discharge Instructions: Thank you for choosing [facility] to provide your oncology and hematology care.   If you have a lab appointment with the Cancer Center, please go directly to the Cancer Center and check in at the registration area.   Wear comfortable clothing and clothing appropriate for easy access to any Portacath or PICC line.   We strive to give you quality time with your provider. You may need to reschedule your appointment if you arrive late (15 or more minutes).  Arriving late affects you and other patients whose appointments are after yours.  Also, if you miss three or more appointments without notifying the office, you may be dismissed from the clinic at the providers discretion.      For prescription refill requests, have your pharmacy contact our office and allow 72 hours for refills to be completed.    Today you received the following chemotherapy and/or immunotherapy agents:  fluorouracil  (ADRUCIL )  leucovorin   nivolumab  (OPDIVO )  oxaliplatin  (ELOXATIN )    To help prevent nausea and vomiting after your treatment, we encourage you to take your nausea medication as directed.  BELOW ARE SYMPTOMS THAT SHOULD BE REPORTED IMMEDIATELY: *FEVER GREATER THAN 100.4 F (38 C) OR HIGHER *CHILLS OR SWEATING *NAUSEA AND VOMITING THAT IS NOT CONTROLLED WITH YOUR NAUSEA MEDICATION *UNUSUAL SHORTNESS OF BREATH *UNUSUAL BRUISING OR BLEEDING *URINARY PROBLEMS (pain or burning when urinating, or frequent urination) *BOWEL PROBLEMS (unusual diarrhea, constipation, pain near the anus) TENDERNESS IN MOUTH AND THROAT WITH OR WITHOUT PRESENCE OF ULCERS (sore throat, sores in mouth, or a toothache) UNUSUAL RASH, SWELLING OR PAIN  UNUSUAL VAGINAL DISCHARGE OR ITCHING   Items with * indicate a potential emergency and should be followed up as soon as possible or go to the Emergency Department if any problems  should occur.  Please show the CHEMOTHERAPY ALERT CARD or IMMUNOTHERAPY ALERT CARD at check-in to the Emergency Department and triage nurse.  Should you have questions after your visit or need to cancel or reschedule your appointment, please contact [facility] - A DEPT [facility]  Dept: [phone]  and follow the prompts.  Office hours are 8:00 a.m. to 4:30 p.m. Monday - Friday. Please note that voicemails left after 4:00 p.m. may not be returned until the following business day.  We are closed weekends and major holidays. You have access to a nurse at all times for urgent questions. Please call the main number to the clinic Dept: [phone] and follow the prompts.   For any non-urgent questions, you may also contact your provider using MyChart. We now offer e-Visits for anyone 37 and older to request care online for non-urgent symptoms. For details visit [email or web address].   Also download the MyChart app! Go to the app store, search MyChart, open the app, select [place], and log in with your MyChart username and password.

## 2024-08-29 NOTE — Progress Notes (Signed)
 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 -Diagnosed in April 2024.  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 -She started first-line chemotherapy FOLFOX in July 2025, Nivo was added on from cycle 3.  Assessment & Plan Stage IV metastatic esophageal cancer She is receiving first-line FOLFOX chemotherapy and tolerating treatment well, without pain or other discomfort. Laboratory results are adequate for ongoing therapy. No acute complications or new symptoms. - Continued first-line FOLFOX chemotherapy regimen. - Planned repeat imaging for restaging in 2-4 weeks. - If imaging demonstrates favorable response, transition to single-agent capecitabine and continue immunotherapy nivo every 4 weeks.     SUMMARY OF ONCOLOGIC HISTORY: Oncology History  Esophageal cancer, stage IV (HCC)  07/25/2024 Initial Diagnosis   Esophageal cancer, stage IV (HCC)   08/29/2024 -  Chemotherapy   Patient is on Treatment Plan : GASTROESOPHAGEAL FOLFOX D1,15 + Nivolumab  q28d        Discussed the use of AI scribe software for clinical note transcription with the patient, who gave verbal consent to proceed.  History of Present Illness Veronica Stewart is a 69 year old female with metastatic esophageal cancer who presents for follow-up to assess chemotherapy tolerance and  disease status.  She is tolerating FOLFOX well with only mild, stable chemotherapy-induced peripheral neuropathy. She denies pain or other new or worsening symptoms.     All other systems were reviewed with the patient and are negative.  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)     SURGICAL HISTORY: Past Surgical History:  Procedure Laterality Date   ABDOMINAL AORTOGRAM W/LOWER EXTREMITY Bilateral 03/05/2020   Procedure: ABDOMINAL AORTOGRAM W/LOWER EXTREMITY;  Surgeon: Gretta Lonni PARAS, MD;  Location: [facility];  Service: Cardiovascular;  Laterality: Bilateral;   COLONOSCOPY WITH PROPOFOL  N/A 12/08/2023   Procedure: COLONOSCOPY WITH PROPOFOL ;  Surgeon: Federico Rosario BROCKS, MD;  Location: [facility];  Service: Gastroenterology;  Laterality: N/A;   ENDARTERECTOMY FEMORAL Left 03/24/2020   Procedure: LEFT COMMON FEMORAL ENDARTERECTOMY;  Surgeon: Gretta Lonni PARAS, MD;  Location: [facility] OR;  Service: Vascular;  Laterality: Left;   ENDOVEIN HARVEST OF GREATER SAPHENOUS VEIN Left 03/24/2020   Procedure: HARVEST OF GREATER SAPHENOUS VEIN;  Surgeon: Gretta Lonni PARAS, MD;  Location: [facility] OR;  Service: Vascular;  Laterality: Left;   ESOPHAGOGASTRODUODENOSCOPY (EGD) WITH PROPOFOL  N/A 12/08/2023   Procedure: ESOPHAGOGASTRODUODENOSCOPY (EGD) WITH PROPOFOL ;  Surgeon: Federico Rosario BROCKS, MD;  Location: [facility];  Service: Gastroenterology;  Laterality: N/A;   FEMORAL-POPLITEAL BYPASS GRAFT Left 03/24/2020   Procedure: BYPASS GRAFT FEMORAL-POPLITEAL ARTERY;  Surgeon: Gretta Lonni PARAS, MD;  Location: [facility] OR;  Service: Vascular;  Laterality: Left;   INSERTION OF ILIAC STENT Left 03/24/2020   Procedure: INSERTION OF RETROGRADE ILIAC STENT;  Surgeon: Gretta Lonni PARAS, MD;  Location: [facility] OR;  Service: Vascular;  Laterality:  Left;   INTRAMEDULLARY (IM) NAIL INTERTROCHANTERIC Left 03/18/2018   Procedure: INTRAMEDULLARY (IM) NAIL INTERTROCHANTRIC;  Surgeon: Kendal Franky SQUIBB, MD;  Location: [facility] OR;  Service: Orthopedics;  Laterality: Left;   INTRAOPERATIVE ARTERIOGRAM Left 03/24/2020   Procedure: INTRA OPERATIVE ARTERIOGRAM;  Surgeon: Gretta Lonni PARAS, MD;  Location: [facility] OR;  Service: Vascular;  Laterality: Left;    I have reviewed the social history and family history with the patient and they are unchanged from previous note.  ALLERGIES:  has no known allergies.  MEDICATIONS:  Current Outpatient Medications  Medication Sig Dispense Refill   acetaminophen  (TYLENOL ) 500 MG tablet Take 500 mg by mouth every 6 (six) hours as needed for mild pain.     acetaminophen  (TYLENOL ) 650 MG CR tablet Take 650 mg by mouth every 8 (eight) hours as needed for pain. (Patient not taking: Reported on 01/17/2024)     albuterol  (VENTOLIN  HFA) 108 (90 Base) MCG/ACT inhaler INHALE 2 PUFFS BY MOUTH EVERY 6 HOURS AS NEEDED FOR WHEEZING AND SHORTNESS OF BREATH (Patient not taking: Reported on 01/17/2024) 18 g 2   amLODipine  (NORVASC ) 10 MG tablet TAKE 1 TABLET(10 MG) BY MOUTH DAILY 90 tablet 2   aspirin  EC 81 MG tablet Take 81 mg by mouth daily. Swallow whole.     DEEP SEA NASAL SPRAY 0.65 % nasal spray SMARTSIG:Both Nares     Emollient (EUCERIN INTENSIVE REPAIR) LOTN Apply 1 Application topically daily at 6 (six) AM.     FeFum-FePoly-FA-B Cmp-C-Biot (INTEGRA PLUS ) CAPS Take 1 capsule by mouth every morning. (Patient not taking: Reported on 08/29/2024) 30 capsule 2   fluticasone  (FLONASE ) 50 MCG/ACT nasal spray SHAKE LIQUID AND USE 2 SPRAYS IN EACH NOSTRIL DAILY 48 g 0   Fluticasone -Salmeterol (ADVAIR) 100-50 MCG/DOSE AEPB Inhale 1 puff into the lungs 2 (two) times daily. 1 each 3   hydrALAZINE  (APRESOLINE ) 25 MG tablet TAKE 1 TABLET BY MOUTH TWICE  DAILY 200 tablet 2   lidocaine -prilocaine (EMLA) cream Apply 1 Application topically as needed.      loratadine  (CLARITIN ) 10 MG tablet TAKE 1 TABLET(10 MG) BY MOUTH DAILY 30 tablet 0   metoprolol  succinate (TOPROL -XL) 25 MG 24 hr tablet TAKE 1 TABLET BY MOUTH DAILY  WITH A MEAL OR IMMEDIATELY AFTER THE SAME MEAL 100 tablet 3   ofloxacin (FLOXIN) 0.3 % OTIC solution Place 5 drops into both ears as needed. (Patient not taking: Reported on 08/29/2024)     ondansetron  (ZOFRAN ) 8 MG tablet Take 8 mg by mouth 3 (three) times daily. (Patient not taking: Reported on 08/29/2024)     pantoprazole  (PROTONIX ) 40 MG tablet TAKE 1 TABLET(40 MG) BY MOUTH TWICE DAILY 180 tablet 3   traMADol  (ULTRAM ) 50 MG tablet Take 50 mg by mouth every 6 (six) hours as needed for moderate pain (pain score 4-6).     No current facility-administered medications for this visit.   Facility-Administered Medications Ordered in Other Visits  Medication Dose Route Frequency Provider Last Rate Last Admin   0.9 %  sodium chloride  infusion   Intravenous Continuous Lanny Callander, MD   Stopped at 08/29/24 1550   dextrose  5 % solution   Intravenous Continuous Lanny Callander, MD 10 mL/hr at 08/29/24 1552 New Bag at 08/29/24 1552   fluorouracil  (ADRUCIL ) 3,500 mg in sodium chloride  0.9 % 80 mL chemo infusion  2,400 mg/m2 (Treatment Plan Recorded) Intravenous 1 day or 1 dose  Lanny Callander, MD       leucovorin  600 mg in dextrose  5 % 250 mL infusion  400 mg/m2 (Treatment Plan Recorded) Intravenous Once Lanny Callander, MD 140 mL/hr at 08/29/24 1555 600 mg at 08/29/24 1555   oxaliplatin  (ELOXATIN ) 100 mg in dextrose  5 % 500 mL chemo infusion  70 mg/m2 (Treatment Plan Recorded) Intravenous Once Lanny Callander, MD 260 mL/hr at 08/29/24 1601 100 mg at 08/29/24 1601    PHYSICAL EXAMINATION: ECOG PERFORMANCE STATUS: 1 - Symptomatic but completely ambulatory  There were no vitals filed for this visit. Wt Readings from Last 3 Encounters:  08/29/24 97 lb 6.4 oz (44.2 kg)  07/25/24 101 lb 6.4 oz (46 kg)  01/25/24 101 lb (45.8 kg)     GENERAL:alert, no distress and  comfortable SKIN: skin color, texture, turgor are normal, no rashes or significant lesions EYES: normal, Conjunctiva are pink and non-injected, sclera clear NECK: supple, thyroid normal size, non-tender, without nodularity LYMPH:  no palpable lymphadenopathy in the cervical, axillary  LUNGS: clear to auscultation and percussion with normal breathing effort HEART: regular rate & rhythm and no murmurs and no lower extremity edema ABDOMEN:abdomen soft, non-tender and normal bowel sounds Musculoskeletal:no cyanosis of digits and no clubbing  NEURO: alert & oriented x 3 with fluent speech, no focal motor/sensory deficits  Physical Exam    LABORATORY DATA:  I have reviewed the data as listed    Latest Ref Rng & Units 08/29/2024   12:56 PM 12/19/2023    2:43 PM 09/20/2023    9:26 AM  CBC  WBC 4.0 - 10.5 K/uL 3.5  7.4  5.1   Hemoglobin 12.0 - 15.0 g/dL 8.4  8.6  87.1   Hematocrit 36.0 - 46.0 % 27.5  26.3  40.6   Platelets 150 - 400 K/uL 215  370  265         Latest Ref Rng & Units 08/29/2024   12:56 PM 10/12/2023    3:22 PM 06/27/2023    3:24 PM  CMP  Glucose 70 - 99 mg/dL 82  70  79   BUN 8 - 23 mg/dL 19  15  28    Creatinine 0.44 - 1.00 mg/dL 8.88  8.39  8.52   Sodium 135 - 145 mmol/L 140  138  136   Potassium 3.5 - 5.1 mmol/L 4.5  5.0  4.8   Chloride 98 - 111 mmol/L 103  99  99   CO2 22 - 32 mmol/L 26  24  26    Calcium  8.9 - 10.3 mg/dL 9.2  9.5  9.9   Total Protein 6.5 - 8.1 g/dL 7.6  7.6  8.3   Total Bilirubin 0.0 - 1.2 mg/dL 0.3  0.5  0.5   Alkaline Phos 38 - 126 U/L 80  65  81   AST 15 - 41 U/L 23  20  24    ALT 0 - 44 U/L 6  7  19        RADIOGRAPHIC STUDIES: I have personally reviewed the radiological images as listed and agreed with the findings in the report. No results found.    Orders Placed This Encounter  Procedures   CT CHEST ABDOMEN PELVIS W CONTRAST    Standing Status:   Future    Expected Date:   09/07/2024    Expiration Date:   08/29/2025    If indicated  for the ordered procedure, I authorize the administration of contrast media per Radiology protocol:   Yes  Does the patient have a contrast media/X-ray dye allergy?:   No    Preferred imaging location?:   [facility]    If indicated for the ordered procedure, I authorize the administration of oral contrast media per Radiology protocol:   Yes   All questions were answered. The patient knows to call the clinic with any problems, questions or concerns. No barriers to learning was detected. The total time spent in the appointment was 25 minutes, including review of chart and various tests results, discussions about plan of care and coordination of care plan     Onita Mattock, MD 08/29/2024     "

## 2024-08-30 LAB — T4: T4, Total: 8.4 ug/dL (ref 4.5–12.0)

## 2024-08-31 ENCOUNTER — Inpatient Hospital Stay

## 2024-08-31 VITALS — BP 141/86 | HR 71 | Temp 98.0°F | Resp 17

## 2024-08-31 DIAGNOSIS — C159 Malignant neoplasm of esophagus, unspecified: Secondary | ICD-10-CM

## 2024-08-31 DIAGNOSIS — Z5112 Encounter for antineoplastic immunotherapy: Secondary | ICD-10-CM | POA: Diagnosis not present

## 2024-09-05 ENCOUNTER — Telehealth: Payer: Self-pay | Admitting: Dietician

## 2024-09-05 NOTE — Telephone Encounter (Signed)
 Patient screened on MST. First attempt to reach. There was no answer when calling home# provided my cell# in a text to return call to set up a nutrition consult.  Micheline Craven, RDN, LDN Registered Dietitian, [facility] Part Time Remote (Usual office hours: Tuesday-Thursday) Cell: [phone]

## 2024-09-11 NOTE — Assessment & Plan Note (Signed)
 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 -Diagnosed in April 2024.  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 -She started first-line chemotherapy FOLFOX in July 2025, Nivo was added on from cycle 3.

## 2024-09-12 ENCOUNTER — Inpatient Hospital Stay

## 2024-09-12 ENCOUNTER — Other Ambulatory Visit: Payer: Self-pay

## 2024-09-12 ENCOUNTER — Inpatient Hospital Stay: Admitting: Dietician

## 2024-09-12 ENCOUNTER — Other Ambulatory Visit: Payer: Self-pay | Admitting: Nurse Practitioner

## 2024-09-12 ENCOUNTER — Inpatient Hospital Stay (HOSPITAL_BASED_OUTPATIENT_CLINIC_OR_DEPARTMENT_OTHER): Admitting: Hematology

## 2024-09-12 ENCOUNTER — Inpatient Hospital Stay: Admitting: Hematology

## 2024-09-12 VITALS — BP 140/85 | HR 74 | Temp 97.5°F | Resp 18

## 2024-09-12 DIAGNOSIS — C154 Malignant neoplasm of middle third of esophagus: Secondary | ICD-10-CM

## 2024-09-12 DIAGNOSIS — D500 Iron deficiency anemia secondary to blood loss (chronic): Secondary | ICD-10-CM

## 2024-09-12 DIAGNOSIS — Z5112 Encounter for antineoplastic immunotherapy: Secondary | ICD-10-CM | POA: Diagnosis not present

## 2024-09-12 DIAGNOSIS — C159 Malignant neoplasm of esophagus, unspecified: Secondary | ICD-10-CM | POA: Diagnosis not present

## 2024-09-12 LAB — CBC WITH DIFFERENTIAL (CANCER CENTER ONLY)
Abs Immature Granulocytes: 0.01 10*3/uL (ref 0.00–0.07)
Basophils Absolute: 0 10*3/uL (ref 0.0–0.1)
Basophils Relative: 1 %
Eosinophils Absolute: 0.2 10*3/uL (ref 0.0–0.5)
Eosinophils Relative: 7 %
HCT: 25 % — ABNORMAL LOW (ref 36.0–46.0)
Hemoglobin: 7.6 g/dL — ABNORMAL LOW (ref 12.0–15.0)
Immature Granulocytes: 0 %
Lymphocytes Relative: 15 %
Lymphs Abs: 0.5 10*3/uL — ABNORMAL LOW (ref 0.7–4.0)
MCH: 25.1 pg — ABNORMAL LOW (ref 26.0–34.0)
MCHC: 30.4 g/dL (ref 30.0–36.0)
MCV: 82.5 fL (ref 80.0–100.0)
Monocytes Absolute: 0.6 10*3/uL (ref 0.1–1.0)
Monocytes Relative: 20 %
Neutro Abs: 1.8 10*3/uL (ref 1.7–7.7)
Neutrophils Relative %: 57 %
Platelet Count: 210 10*3/uL (ref 150–400)
RBC: 3.03 MIL/uL — ABNORMAL LOW (ref 3.87–5.11)
RDW: 21.7 % — ABNORMAL HIGH (ref 11.5–15.5)
WBC Count: 3.1 10*3/uL — ABNORMAL LOW (ref 4.0–10.5)
nRBC: 0 % (ref 0.0–0.2)

## 2024-09-12 LAB — CMP (CANCER CENTER ONLY)
ALT: 7 U/L (ref 0–44)
AST: 23 U/L (ref 15–41)
Albumin: 3.8 g/dL (ref 3.5–5.0)
Alkaline Phosphatase: 79 U/L (ref 38–126)
Anion gap: 9 (ref 5–15)
BUN: 18 mg/dL (ref 8–23)
CO2: 26 mmol/L (ref 22–32)
Calcium: 8.9 mg/dL (ref 8.9–10.3)
Chloride: 104 mmol/L (ref 98–111)
Creatinine: 1.22 mg/dL — ABNORMAL HIGH (ref 0.44–1.00)
GFR, Estimated: 48 mL/min — ABNORMAL LOW
Glucose, Bld: 86 mg/dL (ref 70–99)
Potassium: 4.5 mmol/L (ref 3.5–5.1)
Sodium: 139 mmol/L (ref 135–145)
Total Bilirubin: 0.3 mg/dL (ref 0.0–1.2)
Total Protein: 7.3 g/dL (ref 6.5–8.1)

## 2024-09-12 LAB — PREPARE RBC (CROSSMATCH)

## 2024-09-12 LAB — SAMPLE TO BLOOD BANK

## 2024-09-12 MED ORDER — LEUCOVORIN CALCIUM INJECTION 350 MG
400.0000 mg/m2 | Freq: Once | INTRAVENOUS | Status: AC
  Administered 2024-09-12: 600 mg via INTRAVENOUS
  Filled 2024-09-12: qty 30

## 2024-09-12 MED ORDER — DEXTROSE 5 % IV SOLN: INTRAVENOUS | Status: DC

## 2024-09-12 MED ORDER — DEXAMETHASONE SOD PHOSPHATE PF 10 MG/ML IJ SOLN
10.0000 mg | Freq: Once | INTRAMUSCULAR | Status: AC
  Administered 2024-09-12: 10 mg via INTRAVENOUS
  Filled 2024-09-12: qty 1

## 2024-09-12 MED ORDER — DIPHENHYDRAMINE HCL 50 MG/ML IJ SOLN
25.0000 mg | Freq: Once | INTRAMUSCULAR | Status: AC
  Administered 2024-09-12: 25 mg via INTRAVENOUS
  Filled 2024-09-12: qty 1

## 2024-09-12 MED ORDER — PALONOSETRON HCL INJECTION 0.25 MG/5ML
0.2500 mg | Freq: Once | INTRAVENOUS | Status: AC
  Administered 2024-09-12: 0.25 mg via INTRAVENOUS
  Filled 2024-09-12: qty 5

## 2024-09-12 MED ORDER — OXALIPLATIN CHEMO INJECTION 100 MG/20ML
70.0000 mg/m2 | Freq: Once | INTRAVENOUS | Status: AC
  Administered 2024-09-12: 100 mg via INTRAVENOUS
  Filled 2024-09-12: qty 20

## 2024-09-12 MED ORDER — FAMOTIDINE IN NACL 20-0.9 MG/50ML-% IV SOLN
20.0000 mg | Freq: Once | INTRAVENOUS | Status: AC
  Administered 2024-09-12: 20 mg via INTRAVENOUS
  Filled 2024-09-12: qty 50

## 2024-09-12 MED ORDER — SODIUM CHLORIDE 0.9 % IV SOLN
2400.0000 mg/m2 | INTRAVENOUS | Status: DC
  Administered 2024-09-12: 3500 mg via INTRAVENOUS
  Filled 2024-09-12: qty 70

## 2024-09-12 NOTE — Progress Notes (Signed)
 Verbal order w/readback from Dr Lanny for 1unit of PRBCs to be administered at time of pump d/c.

## 2024-09-12 NOTE — Progress Notes (Signed)
 Nutrition Assessment   Reason for Assessment: +MST   ASSESSMENT: 69 year old female with stage IV distal esophageal cancer. Patient receiving first-line Folfox (start July 2025). Nivolumab  added cycle 3. Patient is under the care of Dr. 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 Reports doing well overall. Says she eats real food and has a good appetite. Denies dysphagia/odynophagia. Usually has a bowl of fruit loops for breakfast (whole milk), sandwich for lunch. Did not feel well yesterday and skipped lunch. Patient recalls bowl of home made soup for dinner last night. She drinks an Ensure everyday. Patient is unsure of which kind. She denies NIS at this time.  Nutrition Focused Physical Exam: deferred - BB   Medications: amlodipine , toprol , floxin, zofran , protonix , ultram    Labs: Cr 1.22   Anthropometrics:   Height: 5'9 Weight: 97 lb 6.4 oz (1/14) UBW: 95-105 lb (since 2021) BMI: 14.38   Estimated Energy Needs  Kcals: 8454-8234 Protein: 57-70 Fluid: >/=1.5 L   NUTRITION DIAGNOSIS: Increased nutrient needs related to stage IV GEJ cancer as evidenced by underwt for BMI   INTERVENTION:  Encourage high calorie high protein foods to support wt gain Continue drinking Ensure Plus/equivalent - suggested drinking additional shake vs skipping meal    MONITORING, EVALUATION, GOAL: Pt will tolerate increased calories and protein to support wt gain/maintenance   Next Visit: To be scheduled as needed

## 2024-09-12 NOTE — Progress Notes (Signed)
 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 -Diagnosed in April 2024.  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 -She started first-line chemotherapy FOLFOX in July 2025, Nivo was added on from cycle 3.  Assessment & Plan Stage IV metastatic esophageal cancer She is receiving first-line chemotherapy with FOLFOX and nivolumab  for metastatic disease. She remains independent in activities of daily living and is able to eat and drink without difficulty. Consideration for transition to oral capecitabine (Xeloda) if disease is stable or responding and her skin is in good condition, to simplify her regimen and eliminate intravenous infusions. Immunotherapy will be continued, potentially as an injection to further simplify administration. - CT scan scheduled for January 29 to assess treatment response. - Planned review of CT scan results and telephone discussion next week regarding subsequent management. - If CT scan demonstrates favorable response and skin is in good condition, discontinue intravenous chemotherapy and initiate oral capecitabine (Xeloda). - Discussed administration of Xeloda as a pill dissolved in water due to her swallowing ability. - Continue immunotherapy, potentially as an injection. - Oral pharmacist Lundy) to review oral chemotherapy regimen with her if transition occurs.  Chemotherapy-induced anemia She has  chemotherapy-induced anemia with low red blood cell count, currently without significant fatigue. Transfusion may be required depending on clinical status and laboratory values. - Arrange blood transfusion on Friday during pump discontinuation if indicated by laboratory results and clinical assessment. - Nurse to notify her regarding blood transfusion logistics.  Plan - Patient is clinically stable, tolerating treatment well overall. - Lab reviewed, will proceed treatment today.  Will arrange 1 unit of blood transfusion with pump DC in 2 days -She is scheduled for restaging CT scan tomorrow, we will reschedule to a later date due to 5-FU pump infusion. - Visit after the CT scan, plan to change maintenance & nivolumab  if scan shows partial response. -f/u in 2 weeks    SUMMARY OF ONCOLOGIC HISTORY: Oncology History  Esophageal cancer, stage IV (HCC)  07/25/2024 Initial Diagnosis   Esophageal cancer, stage IV (HCC)   08/29/2024 -  Chemotherapy   Patient is on Treatment Plan : GASTROESOPHAGEAL FOLFOX D1,15 + Nivolumab  q28d        Discussed the use of AI scribe software for clinical note transcription with the patient, who gave verbal consent to proceed.  History of Present Illness Veronica Stewart is a 69 year old female with metastatic esophageal cancer who presents for follow-up to assess treatment tolerance and plan for upcoming imaging.  She is receiving first-line FOLFOX and nivolumab  and tolerated her most recent cycle without significant adverse effects. She has a CT scan scheduled on September 13, 2024 to assess treatment response.  She denies dysphagia and is eating and drinking without difficulty. She had a prior feeding tube but does not have one currently. She cannot swallow pills whole but takes oral medications dissolved in water.  She does not feel excessively fatigued and describes her energy as acceptable. She has no new symptoms  or medication needs.  She remains  independent in activities of daily living and has no additional concerns relevant to her cancer care today.     All other systems were reviewed with the patient and are negative.  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)     SURGICAL HISTORY: Past Surgical History:  Procedure Laterality Date   ABDOMINAL AORTOGRAM W/LOWER EXTREMITY Bilateral 03/05/2020   Procedure: ABDOMINAL AORTOGRAM W/LOWER EXTREMITY;  Surgeon: Gretta Lonni PARAS, MD;  Location: [facility];  Service: Cardiovascular;  Laterality: Bilateral;   COLONOSCOPY WITH PROPOFOL  N/A 12/08/2023   Procedure: COLONOSCOPY WITH PROPOFOL ;  Surgeon: Federico Rosario BROCKS, MD;  Location: [facility];  Service: Gastroenterology;  Laterality: N/A;   ENDARTERECTOMY FEMORAL Left 03/24/2020   Procedure: LEFT COMMON FEMORAL ENDARTERECTOMY;  Surgeon: Gretta Lonni PARAS, MD;  Location: [facility] OR;  Service: Vascular;  Laterality: Left;   ENDOVEIN HARVEST OF GREATER SAPHENOUS VEIN Left 03/24/2020   Procedure: HARVEST OF GREATER SAPHENOUS VEIN;  Surgeon: Gretta Lonni PARAS, MD;  Location: [facility] OR;  Service: Vascular;  Laterality: Left;   ESOPHAGOGASTRODUODENOSCOPY (EGD) WITH PROPOFOL  N/A 12/08/2023   Procedure: ESOPHAGOGASTRODUODENOSCOPY (EGD) WITH PROPOFOL ;  Surgeon: Federico Rosario BROCKS, MD;  Location: [facility];  Service: Gastroenterology;  Laterality: N/A;   FEMORAL-POPLITEAL BYPASS GRAFT Left 03/24/2020   Procedure: BYPASS GRAFT FEMORAL-POPLITEAL ARTERY;  Surgeon: Gretta Lonni PARAS, MD;  Location: [facility] OR;  Service: Vascular;  Laterality: Left;   INSERTION OF ILIAC STENT Left 03/24/2020   Procedure: INSERTION OF RETROGRADE ILIAC STENT;  Surgeon: Gretta Lonni PARAS, MD;  Location: [facility] OR;  Service: Vascular;  Laterality: Left;   INTRAMEDULLARY (IM) NAIL INTERTROCHANTERIC Left  03/18/2018   Procedure: INTRAMEDULLARY (IM) NAIL INTERTROCHANTRIC;  Surgeon: Kendal Franky SQUIBB, MD;  Location: [facility] OR;  Service: Orthopedics;  Laterality: Left;   INTRAOPERATIVE ARTERIOGRAM Left 03/24/2020   Procedure: INTRA OPERATIVE ARTERIOGRAM;  Surgeon: Gretta Lonni PARAS, MD;  Location: [facility] OR;  Service: Vascular;  Laterality: Left;    I have reviewed the social history and family history with the patient and they are unchanged from previous note.  ALLERGIES:  has no known allergies.  MEDICATIONS:  Current Outpatient Medications  Medication Sig Dispense Refill   acetaminophen  (TYLENOL ) 500 MG tablet Take 500 mg by mouth every 6 (six) hours as needed for mild pain.     acetaminophen  (TYLENOL ) 650 MG CR tablet Take 650 mg by mouth every 8 (eight) hours as needed for pain. (Patient not taking: Reported on 01/17/2024)     albuterol  (VENTOLIN  HFA) 108 (90 Base) MCG/ACT inhaler INHALE 2 PUFFS BY MOUTH EVERY 6 HOURS AS NEEDED FOR WHEEZING AND SHORTNESS OF BREATH (Patient not taking: Reported on 01/17/2024) 18 g 2   amLODipine  (NORVASC ) 10 MG tablet TAKE 1 TABLET(10 MG) BY MOUTH DAILY 90 tablet 2   aspirin  EC 81 MG tablet Take 81 mg by mouth daily. Swallow whole.     DEEP SEA NASAL SPRAY 0.65 % nasal spray SMARTSIG:Both Nares     Emollient (EUCERIN INTENSIVE REPAIR) LOTN Apply 1 Application topically daily at 6 (six) AM.     FeFum-FePoly-FA-B Cmp-C-Biot (INTEGRA PLUS ) CAPS Take 1 capsule by mouth every morning. (Patient not taking: Reported on 08/29/2024) 30 capsule 2   fluticasone  (FLONASE ) 50 MCG/ACT nasal spray SHAKE LIQUID  AND USE 2 SPRAYS IN EACH NOSTRIL DAILY 48 g 0   Fluticasone -Salmeterol (ADVAIR) 100-50 MCG/DOSE AEPB Inhale 1 puff into the lungs 2 (two) times daily. 1 each 3   hydrALAZINE  (APRESOLINE ) 25 MG tablet TAKE 1 TABLET BY MOUTH TWICE  DAILY 200 tablet 2   lidocaine -prilocaine (EMLA) cream Apply 1 Application topically as needed.     loratadine  (CLARITIN ) 10 MG tablet TAKE 1 TABLET(10 MG)  BY MOUTH DAILY 30 tablet 0   metoprolol  succinate (TOPROL -XL) 25 MG 24 hr tablet TAKE 1 TABLET BY MOUTH DAILY  WITH A MEAL OR IMMEDIATELY AFTER THE SAME MEAL 100 tablet 3   ofloxacin (FLOXIN) 0.3 % OTIC solution Place 5 drops into both ears as needed. (Patient not taking: Reported on 08/29/2024)     ondansetron  (ZOFRAN ) 8 MG tablet Take 8 mg by mouth 3 (three) times daily. (Patient not taking: Reported on 08/29/2024)     pantoprazole  (PROTONIX ) 40 MG tablet TAKE 1 TABLET(40 MG) BY MOUTH TWICE DAILY 180 tablet 3   traMADol  (ULTRAM ) 50 MG tablet Take 50 mg by mouth every 6 (six) hours as needed for moderate pain (pain score 4-6).     No current facility-administered medications for this visit.   Facility-Administered Medications Ordered in Other Visits  Medication Dose Route Frequency Provider Last Rate Last Admin   dextrose  5 % solution   Intravenous Continuous Lanny Callander, MD 5 mL/hr at 09/12/24 1148 New Bag at 09/12/24 1148   famotidine  (PEPCID ) IVPB 20 mg premix  20 mg Intravenous Once Burton, Lacie K, NP       fluorouracil  (ADRUCIL ) 3,500 mg in sodium chloride  0.9 % 80 mL chemo infusion  2,400 mg/m2 (Treatment Plan Recorded) Intravenous 1 day or 1 dose Lanny Callander, MD       leucovorin  600 mg in dextrose  5 % 250 mL infusion  400 mg/m2 (Treatment Plan Recorded) Intravenous Once Lanny Callander, MD       oxaliplatin  (ELOXATIN ) 100 mg in dextrose  5 % 500 mL chemo infusion  70 mg/m2 (Treatment Plan Recorded) Intravenous Once Lanny Callander, MD        PHYSICAL EXAMINATION: ECOG PERFORMANCE STATUS: 1 - Symptomatic but completely ambulatory  There were no vitals filed for this visit. Wt Readings from Last 3 Encounters:  08/29/24 97 lb 6.4 oz (44.2 kg)  07/25/24 101 lb 6.4 oz (46 kg)  01/25/24 101 lb (45.8 kg)     GENERAL:alert, no distress and comfortable SKIN: skin color, texture, turgor are normal, no rashes or significant lesions EYES: normal, Conjunctiva are pink and non-injected, sclera clear NECK:  supple, thyroid normal size, non-tender, without nodularity LYMPH:  no palpable lymphadenopathy in the cervical, axillary  LUNGS: clear to auscultation and percussion with normal breathing effort HEART: regular rate & rhythm and no murmurs and no lower extremity edema ABDOMEN:abdomen soft, non-tender and normal bowel sounds Musculoskeletal:no cyanosis of digits and no clubbing  NEURO: alert & oriented x 3 with fluent speech, no focal motor/sensory deficits  Physical Exam    LABORATORY DATA:  I have reviewed the data as listed    Latest Ref Rng & Units 09/12/2024   11:42 AM 08/29/2024   12:56 PM 12/19/2023    2:43 PM  CBC  WBC 4.0 - 10.5 K/uL 3.1  3.5  7.4   Hemoglobin 12.0 - 15.0 g/dL 7.6  8.4  8.6   Hematocrit 36.0 - 46.0 % 25.0  27.5  26.3   Platelets 150 - 400 K/uL 210  215  370         Latest Ref Rng & Units 09/12/2024   11:42 AM 08/29/2024   12:56 PM 10/12/2023    3:22 PM  CMP  Glucose 70 - 99 mg/dL 86  82  70   BUN 8 - 23 mg/dL 18  19  15    Creatinine 0.44 - 1.00 mg/dL 8.77  8.88  8.39   Sodium 135 - 145 mmol/L 139  140  138   Potassium 3.5 - 5.1 mmol/L 4.5  4.5  5.0   Chloride 98 - 111 mmol/L 104  103  99   CO2 22 - 32 mmol/L 26  26  24    Calcium  8.9 - 10.3 mg/dL 8.9  9.2  9.5   Total Protein 6.5 - 8.1 g/dL 7.3  7.6  7.6   Total Bilirubin 0.0 - 1.2 mg/dL 0.3  0.3  0.5   Alkaline Phos 38 - 126 U/L 79  80  65   AST 15 - 41 U/L 23  23  20    ALT 0 - 44 U/L 7  6  7        RADIOGRAPHIC STUDIES: I have personally reviewed the radiological images as listed and agreed with the findings in the report. No results found.    No orders of the defined types were placed in this encounter.  All questions were answered. The patient knows to call the clinic with any problems, questions or concerns. No barriers to learning was detected. The total time spent in the appointment was 25 minutes, including review of chart and various tests results, discussions about plan of care and  coordination of care plan     Onita Mattock, MD 09/12/2024     "

## 2024-09-13 ENCOUNTER — Ambulatory Visit (HOSPITAL_COMMUNITY)

## 2024-09-14 ENCOUNTER — Inpatient Hospital Stay

## 2024-09-14 ENCOUNTER — Other Ambulatory Visit: Payer: Self-pay

## 2024-09-14 ENCOUNTER — Ambulatory Visit (HOSPITAL_COMMUNITY)

## 2024-09-14 NOTE — Progress Notes (Signed)
 Patient decided she did not want blood today. Pump stopped and her port was de-accessed.

## 2024-09-16 LAB — TYPE AND SCREEN
ABO/RH(D): O POS
Antibody Screen: POSITIVE
DAT, IgG: POSITIVE
Unit division: 0

## 2024-09-16 LAB — BPAM RBC
Blood Product Expiration Date: 202602242359
Unit Type and Rh: 5100

## 2024-09-17 ENCOUNTER — Ambulatory Visit (HOSPITAL_COMMUNITY)

## 2024-09-20 ENCOUNTER — Other Ambulatory Visit: Payer: Self-pay | Admitting: Nurse Practitioner

## 2024-09-20 ENCOUNTER — Other Ambulatory Visit (INDEPENDENT_AMBULATORY_CARE_PROVIDER_SITE_OTHER): Payer: Self-pay | Admitting: Primary Care

## 2024-09-20 ENCOUNTER — Encounter: Payer: Self-pay | Admitting: Hematology

## 2024-09-20 DIAGNOSIS — C159 Malignant neoplasm of esophagus, unspecified: Secondary | ICD-10-CM

## 2024-09-20 DIAGNOSIS — J302 Other seasonal allergic rhinitis: Secondary | ICD-10-CM

## 2024-09-20 NOTE — Telephone Encounter (Unsigned)
 Copied from CRM [id number]. Topic: Clinical - Medication Refill >> Sep 20, 2024 12:36 PM Sophia H wrote: Medication: Loratadine  10 MG   Has the patient contacted their pharmacy? Yes, told they have reached out with no response.   This is the patient's preferred pharmacy:  [facility] [address], [place] - [address] AT [facility] [address] [place] [address] Phone: [phone] Fax: [phone]   Is this the correct pharmacy for this prescription? Yes If no, delete pharmacy and type the correct one.   Has the prescription been filled recently? Yes  Is the patient out of the medication? Yes, completely out.   Has the patient been seen for an appointment in the last year OR does the patient have an upcoming appointment? Appt scheduled for March 04.   Can we respond through MyChart? No, prefers phone call.   Agent: Please be advised that Rx refills may take up to 3 business days. We ask that you follow-up with your pharmacy.

## 2024-09-20 NOTE — Telephone Encounter (Signed)
 Will forward to provider

## 2024-09-21 NOTE — Telephone Encounter (Signed)
 Requested Prescriptions  Pending Prescriptions Disp Refills   loratadine  (CLARITIN ) 10 MG tablet [Pharmacy Med Name: ALLERGY RELF (LORATADINE ) 10MG  TABS] 30 tablet 0    Sig: TAKE 1 TABLET(10 MG) BY MOUTH DAILY     Ear, Nose, and Throat:  Antihistamines 2 Failed - 09/21/2024  3:52 PM      Failed - Cr in normal range and within 360 days    Creatinine  Date Value Ref Range Status  09/12/2024 1.22 (H) 0.44 - 1.00 mg/dL Final         Passed - Valid encounter within last 12 months    Recent Outpatient Visits           8 months ago Essential hypertension   [place] [facility] Celestia Rosaline SQUIBB, NP   11 months ago Encounter for screening mammogram for malignant neoplasm of breast   [place] [facility] Celestia Rosaline SQUIBB, NP   1 year ago Essential hypertension   [place] [facility] Celestia Rosaline SQUIBB, NP   1 year ago Need for immunization against influenza   [place] [facility] Celestia Rosaline SQUIBB, NP   2 years ago Essential hypertension   [place] [facility] Celestia Rosaline SQUIBB, NP

## 2024-09-25 ENCOUNTER — Ambulatory Visit (HOSPITAL_COMMUNITY)

## 2024-09-27 ENCOUNTER — Inpatient Hospital Stay: Attending: Physician Assistant | Admitting: Hematology

## 2024-09-27 ENCOUNTER — Inpatient Hospital Stay

## 2024-09-27 ENCOUNTER — Inpatient Hospital Stay: Attending: Physician Assistant

## 2024-10-10 ENCOUNTER — Inpatient Hospital Stay: Admitting: Nurse Practitioner

## 2024-10-10 ENCOUNTER — Inpatient Hospital Stay

## 2024-10-17 ENCOUNTER — Ambulatory Visit: Admitting: Podiatry

## 2024-10-17 ENCOUNTER — Ambulatory Visit (INDEPENDENT_AMBULATORY_CARE_PROVIDER_SITE_OTHER): Payer: Self-pay | Admitting: Primary Care
